# Patient Record
Sex: Male | Born: 1955 | Race: White | Hispanic: No | Marital: Married | Smoking: Current every day smoker
Health system: Southern US, Community
[De-identification: ages and names within clinical notes are randomized; demographics above are authoritative.]

## PROBLEM LIST (undated history)

## (undated) DIAGNOSIS — I6523 Occlusion and stenosis of bilateral carotid arteries: Secondary | ICD-10-CM

## (undated) DIAGNOSIS — I4510 Unspecified right bundle-branch block: Secondary | ICD-10-CM

## (undated) DIAGNOSIS — F419 Anxiety disorder, unspecified: Secondary | ICD-10-CM

## (undated) DIAGNOSIS — F319 Bipolar disorder, unspecified: Secondary | ICD-10-CM

## (undated) DIAGNOSIS — G4730 Sleep apnea, unspecified: Secondary | ICD-10-CM

## (undated) DIAGNOSIS — I10 Essential (primary) hypertension: Secondary | ICD-10-CM

## (undated) DIAGNOSIS — K219 Gastro-esophageal reflux disease without esophagitis: Secondary | ICD-10-CM

## (undated) DIAGNOSIS — F32A Depression, unspecified: Secondary | ICD-10-CM

## (undated) DIAGNOSIS — F329 Major depressive disorder, single episode, unspecified: Secondary | ICD-10-CM

## (undated) DIAGNOSIS — E119 Type 2 diabetes mellitus without complications: Secondary | ICD-10-CM

## (undated) DIAGNOSIS — J449 Chronic obstructive pulmonary disease, unspecified: Secondary | ICD-10-CM

## (undated) DIAGNOSIS — F1910 Other psychoactive substance abuse, uncomplicated: Secondary | ICD-10-CM

## (undated) DIAGNOSIS — C801 Malignant (primary) neoplasm, unspecified: Secondary | ICD-10-CM

## (undated) DIAGNOSIS — I499 Cardiac arrhythmia, unspecified: Secondary | ICD-10-CM

## (undated) DIAGNOSIS — F1020 Alcohol dependence, uncomplicated: Secondary | ICD-10-CM

## (undated) HISTORY — DX: Sleep apnea, unspecified: G47.30

## (undated) HISTORY — DX: Major depressive disorder, single episode, unspecified: F32.9

## (undated) HISTORY — DX: Chronic obstructive pulmonary disease, unspecified: J44.9

## (undated) HISTORY — DX: Anxiety disorder, unspecified: F41.9

## (undated) HISTORY — PX: CHOLECYSTECTOMY: SHX55

## (undated) HISTORY — PX: WISDOM TOOTH EXTRACTION: SHX21

## (undated) HISTORY — DX: Depression, unspecified: F32.A

## (undated) HISTORY — DX: Other psychoactive substance abuse, uncomplicated: F19.10

---

## 2011-03-23 ENCOUNTER — Ambulatory Visit (INDEPENDENT_AMBULATORY_CARE_PROVIDER_SITE_OTHER): Payer: Federal, State, Local not specified - PPO | Admitting: Family Medicine

## 2011-03-23 ENCOUNTER — Encounter: Payer: Self-pay | Admitting: Family Medicine

## 2011-03-23 ENCOUNTER — Ambulatory Visit: Payer: Federal, State, Local not specified - PPO

## 2011-03-23 DIAGNOSIS — Z23 Encounter for immunization: Secondary | ICD-10-CM

## 2011-03-23 DIAGNOSIS — M25562 Pain in left knee: Secondary | ICD-10-CM

## 2011-03-23 DIAGNOSIS — Z720 Tobacco use: Secondary | ICD-10-CM

## 2011-03-23 DIAGNOSIS — R079 Chest pain, unspecified: Secondary | ICD-10-CM | POA: Insufficient documentation

## 2011-03-23 DIAGNOSIS — J45909 Unspecified asthma, uncomplicated: Secondary | ICD-10-CM | POA: Insufficient documentation

## 2011-03-23 DIAGNOSIS — G8929 Other chronic pain: Secondary | ICD-10-CM | POA: Insufficient documentation

## 2011-03-23 DIAGNOSIS — G4733 Obstructive sleep apnea (adult) (pediatric): Secondary | ICD-10-CM

## 2011-03-23 DIAGNOSIS — M25551 Pain in right hip: Secondary | ICD-10-CM

## 2011-03-23 DIAGNOSIS — F419 Anxiety disorder, unspecified: Secondary | ICD-10-CM | POA: Insufficient documentation

## 2011-03-23 DIAGNOSIS — Z0000 Encounter for general adult medical examination without abnormal findings: Secondary | ICD-10-CM

## 2011-03-23 DIAGNOSIS — J449 Chronic obstructive pulmonary disease, unspecified: Secondary | ICD-10-CM

## 2011-03-23 DIAGNOSIS — F17200 Nicotine dependence, unspecified, uncomplicated: Secondary | ICD-10-CM | POA: Insufficient documentation

## 2011-03-23 DIAGNOSIS — E669 Obesity, unspecified: Secondary | ICD-10-CM

## 2011-03-23 LAB — POCT UA - MICROSCOPIC ONLY
Bacteria, U Microscopic: NEGATIVE
Casts, Ur, LPF, POC: NEGATIVE
Crystals, Ur, HPF, POC: NEGATIVE
Mucus, UA: NEGATIVE
RBC, urine, microscopic: NEGATIVE
Yeast, UA: NEGATIVE

## 2011-03-23 LAB — CBC
MCH: 32.6 pg (ref 26.0–34.0)
MCV: 97.3 fL (ref 78.0–100.0)
Platelets: 175 10*3/uL (ref 150–400)
RDW: 13.5 % (ref 11.5–15.5)

## 2011-03-23 LAB — POCT URINALYSIS DIPSTICK
Nitrite, UA: NEGATIVE
Protein, UA: NEGATIVE
Spec Grav, UA: >=1.03
Urobilinogen, UA: 0.2

## 2011-03-23 LAB — IFOBT (OCCULT BLOOD): IFOBT: NEGATIVE

## 2011-03-23 LAB — POCT GLYCOSYLATED HEMOGLOBIN (HGB A1C): Hemoglobin A1C: 5.7

## 2011-03-23 NOTE — Progress Notes (Signed)
Subjective:    Patient ID: [name] [name], male    DOB: [birth year], 56 y.o.   MRN: [id number]  HPI     This 56 y.o Cauc male is here for annual physical; his last visit at [facility] was June 2010. He has several chronic medical issues including: Chronic Anxiety since late 1990s which is managed by his Psychiatrist (Dr. Lorrin Mais ) who is located in [place]; Obstructive Sleep Apnea- pt has an older model CPAP and needs updating, Chronic Tobacco use with several unsuccessful attempts at cessation. The pt has been unable to quit smoking because of Chronic Anxiety which has also disabled him for several years; he last worked in 2006.He has also been unsuccessful at weight reduction because of poor stamina, dyspnea and joint discomfort. He reports walking and yard work 3-4 x per week.  PMHx: HTN, Abnormal Glucose Tolerance (pt was on Glipizide 1 -1/2 years ago but was able to lose                50 lbs so med was stopped; pt checks PPG= 140), Depression.   HCM per pt Hx: Colonoscopy- 2006 (polyps?)                           ECG- 07/2009-normal                           DEXA- 2006- normal    Review of Systems  Constitutional: Positive for activity change and fatigue. Negative for appetite change and unexpected weight change.  HENT: Positive for congestion and rhinorrhea. Negative for sneezing, postnasal drip and ear discharge.   Eyes: Positive for visual disturbance. Negative for discharge and redness.  Respiratory: Positive for chest tightness and shortness of breath. Negative for wheezing.        Had URI symptoms 6 weeks ago.  Cardiovascular: Positive for chest pain. Negative for palpitations and leg swelling.       Negative for PND or difficulty walking 2 blocks.  Gastrointestinal: Negative.   Genitourinary: Negative.   Musculoskeletal: Positive for arthralgias. Negative for myalgias and joint swelling.       Positive for stiffness in joints- hips,kness and persistent left shoulder pain.  Skin:  Negative.   Neurological: Negative for dizziness, syncope, weakness, numbness and headaches.  Hematological: Negative.   Psychiatric/Behavioral:       Chronic hx of Anxiety and Depression as outlined in HPI;also has chronic Insomnia.       Objective:   Physical Exam  Constitutional: He is oriented to person, place, and time. He appears well-developed. No distress.       Moderately Obese  HENT:  Head: Normocephalic and atraumatic.  Right Ear: External ear normal.  Left Ear: External ear normal.  Mouth/Throat: No oropharyngeal exudate.       Oropharynx is red and noted to be narrow.  Eyes: Conjunctivae and EOM are normal. Pupils are equal, round, and reactive to light. Left eye exhibits no discharge. No scleral icterus.  Neck: Normal range of motion. Neck supple. No JVD present. No thyromegaly present.  Cardiovascular: Normal rate, regular rhythm and normal heart sounds.  Exam reveals no gallop and no friction rub.   No murmur heard.      Distal pulses were slightly diminished.  Pulmonary/Chest: Effort normal. He has no wheezes. He has no rales.       Breath sounds -distant. Chest is barrel-shaped.  Abdominal: Soft.  He exhibits no distension. There is no tenderness. There is no guarding.       Difficult to appreciate organomegaly or masses due to adipose tissue.  Genitourinary: Rectum normal, prostate normal and penis normal.  Musculoskeletal: He exhibits no tenderness.       Major joints without redness, swelling or deformities. ROM slightly decreased and accompanied by stiffness.  Lymphadenopathy:    He has no cervical adenopathy.  Neurological: He is alert and oriented to person, place, and time. He has normal reflexes. No cranial nerve deficit.  Skin: Skin is warm and dry.  Psychiatric: He has a normal mood and affect. Judgment and thought content normal.       He is visibly anxious        CXR: [facility] read- Peribronchial opacities without solid masses. Degenerative changes in  thoracic spine. Suspect mlid COPD in this chronic smoker.   Dory Horn, M.D. Assessment & Plan:   1. Chronic anxiety  Cont. Current meds and follow-up with Psychiatrist.  2. Tobacco user  Encouraged attempt at reduction; Print info with tips regarding cessation given to wife who also smokes.  3. Obstructive sleep apnea  Refer to Pulmonologist for further evaluation (PFTs) and recommendation re: CPAP  4. Obesity  Encouraged small dietary changes and lifestyle changes that will promote some weight reduction. Also Advised pt to reduce Alcohol intake to 1-2 beers per sitting.  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   6. Chest pain  Suspect secondary to Pulmonary dysfunction, chronic Tobacco use and obesity. Also musculoskeletal component.  7. Routine general medical examination at a health care facility    8. Hematuria  Urinalysis shows trace blood, no RBCs on microscopic.  9. COPD, mild  Full Pulmonary eval pending; pt is mildly symptomatic.

## 2011-03-23 NOTE — Patient Instructions (Signed)
Today, we addressed several issues:  Tobacco use and Sleep Apnea- your chest xray looks like mild COPD but the official read is pending. You are being referred to a Lung Specialist. Chronic Anxiety- cont. Your meds and see your Psychiatrist. Obesity- work on healthier food choices and try to be more active.   Obesity Obesity is defined as having a body mass index (BMI) of 30 or more. To calculate your BMI divide your weight in pounds by your height in inches squared and multiply that product by 703. Major illnesses resulting from long-term obesity include:  Stroke.   Heart disease.   Diabetes.   Many cancers.   Arthritis.  Obesity also complicates recovery from many other medical problems.  CAUSES   A history of obesity in your parents.   Thyroid hormone imbalance.   Environmental factors such as excess calorie intake and physical inactivity.  TREATMENT  A healthy weight loss program includes:  A calorie restricted diet based on individual calorie needs.   Increased physical activity (exercise).  An exercise program is just as important as the right low-calorie diet.  Weight-loss medicines should be used only under the supervision of your physician. These medicines help, but only if they are used with diet and exercise programs. Medicines can have side effects including nervousness, nausea, abdominal pain, diarrhea, headache, drowsiness, and depression.  An unhealthy weight loss program includes:  Fasting.   Fad diets.   Supplements and drugs.  These choices do not succeed in long-term weight control.  HOME CARE INSTRUCTIONS  To help you make the needed dietary changes:   Exercise and perform physical activity as directed by your caregiver.   Keep a daily record of everything you eat. There are many free websites to help you with this. It may be helpful to measure your foods so you can determine if you are eating the correct portion sizes.   Use low-calorie cookbooks  or take special cooking classes.   Avoid alcohol. Drink more water and drinks with no calories.   Take vitamins and supplements only as recommended by your caregiver.   Weight loss support groups, Registered Dieticians, counselors, and stress reduction education can also be very helpful.  Document Released: 03/15/2004 Document Revised: 10/18/2010 Document Reviewed: 01/12/2007 [facility] Patient Information 2012 [place], [place].

## 2011-03-24 ENCOUNTER — Encounter: Payer: Self-pay | Admitting: Family Medicine

## 2011-03-24 ENCOUNTER — Other Ambulatory Visit: Payer: Self-pay | Admitting: Family Medicine

## 2011-03-24 LAB — VITAMIN D 25 HYDROXY (VIT D DEFICIENCY, FRACTURES): Vit D, 25-Hydroxy: 19 ng/mL — ABNORMAL LOW (ref 30–89)

## 2011-03-24 LAB — COMPREHENSIVE METABOLIC PANEL
ALT: 53 U/L (ref 0–53)
AST: 26 U/L (ref 0–37)
Alkaline Phosphatase: 78 U/L (ref 39–117)
Creat: 0.83 mg/dL (ref 0.50–1.35)
Sodium: 138 mEq/L (ref 135–145)
Total Bilirubin: 0.4 mg/dL (ref 0.3–1.2)

## 2011-03-24 LAB — LIPID PANEL
Cholesterol: 179 mg/dL (ref 0–200)
Total CHOL/HDL Ratio: 6.6 Ratio

## 2011-03-24 LAB — PSA: PSA: 0.42 ng/mL (ref <=4.00)

## 2011-03-24 MED ORDER — VITAMIN D3 1.25 MG (50000 UT) PO CAPS: 1.0000 | ORAL_CAPSULE | ORAL | Status: DC

## 2011-03-24 NOTE — Progress Notes (Signed)
Quick Note:  You labs are abnormal. Please call pt to notify him that his Triglycerides are very high (547) and this is due to his excess beer intake.His HDL is too low and his Glucose is a little high. Diet changes are necessary as we discussed. Also Vitamin D is very low. I will prescribe Vit D 50,000 IU to be taken once a week for the next 4 months or longer. This will be routed to his Pharmacy.  When he returns for follow-up, we will repeat these labs so he needs to be fasting.  ______

## 2011-04-19 ENCOUNTER — Telehealth: Payer: Self-pay

## 2011-04-19 NOTE — Telephone Encounter (Signed)
Faxed over requested records per Dr Alona Bene request

## 2011-04-19 NOTE — Telephone Encounter (Signed)
.  [facility] KATIE FROM DR Alona Bene OFFICE STATES THEY NEED RECORDS/LABS FAXED ON PT PLEASE FAX TO [phone] AND THE PHONE NUMBER IS [phone]

## 2011-04-20 ENCOUNTER — Ambulatory Visit (INDEPENDENT_AMBULATORY_CARE_PROVIDER_SITE_OTHER): Payer: Federal, State, Local not specified - PPO | Admitting: Internal Medicine

## 2011-04-20 ENCOUNTER — Encounter: Payer: Self-pay | Admitting: Internal Medicine

## 2011-04-20 VITALS — BP 130/72 | HR 92 | Ht 71.0 in | Wt 265.8 lb

## 2011-04-20 DIAGNOSIS — F411 Generalized anxiety disorder: Secondary | ICD-10-CM

## 2011-04-20 DIAGNOSIS — G4733 Obstructive sleep apnea (adult) (pediatric): Secondary | ICD-10-CM

## 2011-04-20 DIAGNOSIS — J449 Chronic obstructive pulmonary disease, unspecified: Secondary | ICD-10-CM

## 2011-04-20 DIAGNOSIS — Z720 Tobacco use: Secondary | ICD-10-CM

## 2011-04-20 DIAGNOSIS — Z23 Encounter for immunization: Secondary | ICD-10-CM

## 2011-04-20 DIAGNOSIS — F17200 Nicotine dependence, unspecified, uncomplicated: Secondary | ICD-10-CM

## 2011-04-20 DIAGNOSIS — E669 Obesity, unspecified: Secondary | ICD-10-CM

## 2011-04-20 DIAGNOSIS — F419 Anxiety disorder, unspecified: Secondary | ICD-10-CM

## 2011-04-20 MED ORDER — CLONAZEPAM 0.5 MG PO TABS: ORAL_TABLET | ORAL | Status: DC

## 2011-04-20 NOTE — Patient Instructions (Signed)
Order- DME[facility]--   BiPAP autotitration for pressure check    Insp  10-20   X  Exp  10- 20 cwp    X 2 weeks  Order- pneumovax  Script Clonazepam  To try for insomnia
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
## 2011-04-24 NOTE — Assessment & Plan Note (Signed)
Chest sounds clear despite significant tobacco use. We began a discussion of smoking cessation support techniques.

## 2011-04-24 NOTE — Assessment & Plan Note (Signed)
Insomnia discussed and addressed with clonazepam 0.5 mg 1-3 for sleep if needed

## 2011-04-24 NOTE — Assessment & Plan Note (Signed)
Consider referral to the bariatric program for weight loss.

## 2011-04-24 NOTE — Assessment & Plan Note (Signed)
Emphasized tobacco cessation. Gait pneumonia vaccine. Consider formal PFT at a future visit.

## 2011-04-24 NOTE — Assessment & Plan Note (Signed)
Weight loss encouraged. Plan-auto titrate BiPAP for pressure. Consider new machine.

## 2011-05-11 ENCOUNTER — Telehealth: Payer: Self-pay | Admitting: Internal Medicine

## 2011-05-11 NOTE — Telephone Encounter (Signed)
1 sample given to last until ov

## 2011-05-22 ENCOUNTER — Encounter: Payer: Self-pay | Admitting: Internal Medicine

## 2011-05-22 ENCOUNTER — Ambulatory Visit (INDEPENDENT_AMBULATORY_CARE_PROVIDER_SITE_OTHER): Payer: Federal, State, Local not specified - PPO | Admitting: Internal Medicine

## 2011-05-22 VITALS — BP 126/64 | HR 66 | Ht 71.0 in | Wt 248.6 lb

## 2011-05-22 DIAGNOSIS — J449 Chronic obstructive pulmonary disease, unspecified: Secondary | ICD-10-CM

## 2011-05-22 DIAGNOSIS — F419 Anxiety disorder, unspecified: Secondary | ICD-10-CM

## 2011-05-22 DIAGNOSIS — J40 Bronchitis, not specified as acute or chronic: Secondary | ICD-10-CM

## 2011-05-22 DIAGNOSIS — F411 Generalized anxiety disorder: Secondary | ICD-10-CM

## 2011-05-22 DIAGNOSIS — J4489 Other specified chronic obstructive pulmonary disease: Secondary | ICD-10-CM

## 2011-05-22 DIAGNOSIS — G4733 Obstructive sleep apnea (adult) (pediatric): Secondary | ICD-10-CM

## 2011-05-22 DIAGNOSIS — G4730 Sleep apnea, unspecified: Secondary | ICD-10-CM

## 2011-05-22 MED ORDER — ALBUTEROL SULFATE HFA 108 (90 BASE) MCG/ACT IN AERS: 2.0000 | INHALATION_SPRAY | Freq: Four times a day (QID) | RESPIRATORY_TRACT | Status: DC | PRN
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
## 2011-05-22 NOTE — Patient Instructions (Signed)
Order- [facility]- BIPAP current settings Insp 13, exp 9     May adjust for comfort in range   insp  12-15,    Exp    8-15   As needed.  Order- schedule PFT   Dx chronic bronchitis  Script for rescue albuterol HFA inhaler for use if needed  Try using clonazepam at bedtime for sleep, then if you wake later, try using lorazepam to avoid morning drug carry-over.

## 2011-05-24 ENCOUNTER — Encounter: Payer: Self-pay | Admitting: Internal Medicine

## 2011-05-30 ENCOUNTER — Encounter: Payer: Self-pay | Admitting: Internal Medicine

## 2011-05-30 NOTE — Assessment & Plan Note (Signed)
I explained the need to use CPAP/BiPAP all night every night. His download from autotitration indicated CPAP of 14 would give good control with an AHI of 1 per hour. Based on direct observation his wife changed his settings to his story 13 and expiratory 9. We will leave it there and communicate with his DME company so there is no confusion. Tries and clonazepam just at bedtime. If he wakes at 4 AM use lorazepam which will have a shorter half-life as discussed.

## 2011-05-30 NOTE — Assessment & Plan Note (Signed)
Plan PFT and an HFA rescue inhaler.

## 2011-06-14 ENCOUNTER — Ambulatory Visit (INDEPENDENT_AMBULATORY_CARE_PROVIDER_SITE_OTHER): Payer: Federal, State, Local not specified - PPO | Admitting: Internal Medicine

## 2011-06-14 DIAGNOSIS — J40 Bronchitis, not specified as acute or chronic: Secondary | ICD-10-CM

## 2011-06-14 LAB — PULMONARY FUNCTION TEST

## 2011-06-14 NOTE — Progress Notes (Signed)
PFT done today. 

## 2011-07-31 ENCOUNTER — Other Ambulatory Visit: Payer: Self-pay | Admitting: Internal Medicine

## 2011-08-02 NOTE — Telephone Encounter (Signed)
Please advise if okay to refill. Thanks.  

## 2011-08-03 NOTE — Telephone Encounter (Signed)
Ok to refill 

## 2011-08-07 NOTE — Telephone Encounter (Signed)
ok 

## 2011-08-09 NOTE — Telephone Encounter (Signed)
What is this one asking for? 

## 2011-09-24 ENCOUNTER — Ambulatory Visit: Payer: Federal, State, Local not specified - PPO | Admitting: Internal Medicine

## 2011-10-18 ENCOUNTER — Ambulatory Visit (INDEPENDENT_AMBULATORY_CARE_PROVIDER_SITE_OTHER): Payer: Federal, State, Local not specified - PPO | Admitting: Internal Medicine

## 2011-10-18 ENCOUNTER — Encounter: Payer: Self-pay | Admitting: Internal Medicine

## 2011-10-18 VITALS — BP 106/60 | HR 63 | Ht 71.0 in | Wt 237.8 lb

## 2011-10-18 DIAGNOSIS — Z720 Tobacco use: Secondary | ICD-10-CM

## 2011-10-18 DIAGNOSIS — J4489 Other specified chronic obstructive pulmonary disease: Secondary | ICD-10-CM

## 2011-10-18 DIAGNOSIS — F17200 Nicotine dependence, unspecified, uncomplicated: Secondary | ICD-10-CM

## 2011-10-18 DIAGNOSIS — J449 Chronic obstructive pulmonary disease, unspecified: Secondary | ICD-10-CM

## 2011-10-18 DIAGNOSIS — G4733 Obstructive sleep apnea (adult) (pediatric): Secondary | ICD-10-CM

## 2011-10-18 MED ORDER — CLONAZEPAM 0.5 MG PO TABS: 0.5000 mg | ORAL_TABLET | Freq: Every evening | ORAL | Status: DC | PRN
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
## 2011-10-18 NOTE — Patient Instructions (Addendum)
Continue BIPAP 13/9  An otc antihistamine is fine as needed

## 2011-10-24 ENCOUNTER — Other Ambulatory Visit: Payer: Self-pay | Admitting: Internal Medicine

## 2011-10-28 NOTE — Assessment & Plan Note (Signed)
Good compliance and control. His wife knows how to change the pressures on his machine and I have explained why to be cautious about this.

## 2011-10-28 NOTE — Assessment & Plan Note (Signed)
Counseling reinforced 

## 2011-10-28 NOTE — Assessment & Plan Note (Signed)
I reinforced the importance of smoking cessation to prevent this from getting worse.  Plan-follow chest x-ray as needed.

## 2011-11-27 ENCOUNTER — Ambulatory Visit (INDEPENDENT_AMBULATORY_CARE_PROVIDER_SITE_OTHER): Payer: Federal, State, Local not specified - PPO | Admitting: Family Medicine

## 2011-11-27 ENCOUNTER — Ambulatory Visit: Payer: Federal, State, Local not specified - PPO

## 2011-11-27 VITALS — BP 132/84 | HR 70 | Temp 98.4°F | Resp 18 | Ht 70.0 in | Wt 242.0 lb

## 2011-11-27 DIAGNOSIS — M20011 Mallet finger of right finger(s): Secondary | ICD-10-CM

## 2011-11-27 DIAGNOSIS — M79609 Pain in unspecified limb: Secondary | ICD-10-CM

## 2011-11-27 DIAGNOSIS — M79644 Pain in right finger(s): Secondary | ICD-10-CM

## 2011-11-27 DIAGNOSIS — M20019 Mallet finger of unspecified finger(s): Secondary | ICD-10-CM

## 2011-11-27 NOTE — Progress Notes (Signed)
  Subjective:    Patient ID: [name] [name], male    DOB: 04/11/1955, 56 y.o.   MRN: [id number]  HPI  3 wks ago he pushed a door open 5th finger got stuck. He had a pain and swelling around the mid-phalynx with DIP joint flexion so he wore a homemade splint with taping and a tongue depressor for about a wk but then has become progressively flexed over the past 2 wks. Pain wasn't to bad initially but is becoming worse and more painful. Pain is relieved if 5th DIP joint is held in extension, worse at rest and with added flexion.  Still red and swollen    Review of Systems     Objective:   Physical Exam  Constitutional: He is oriented to person, place, and time. He appears well-developed and well-nourished. No distress.  HENT:  Head: Normocephalic and atraumatic.  Right Ear: External ear normal.  Left Ear: External ear normal.  Eyes: Conjunctivae normal are normal. No scleral icterus.  Pulmonary/Chest: Effort normal.  Musculoskeletal:       Flexion at Right 5th DIP joint w/o PROM and mildly decreased AROM and mild effusion  Neurological: He is alert and oriented to person, place, and time.  Skin: Skin is warm and dry. He is not diaphoretic. There is erythema.  Psychiatric: He has a normal mood and affect. His behavior is normal.         [facility] reading (PRIMARY) by  Dr. Clelia Croft.  Rt 5th finger xray: subacute DIP joint injury with about 30 deg joint flexion and soft tissue swelling   Assessment & Plan:  1. 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 Splint applied - cautioned that if splint is ever removed, he needs to hold joint in extension the entire time.  Recheck in 1-2 wks to assess compliance and improvement.  If pt is not able to comply with splint for this length of time or it doesn't resolve, refer to hand surg. Pt agrees w/ plan.

## 2012-04-18 ENCOUNTER — Ambulatory Visit (INDEPENDENT_AMBULATORY_CARE_PROVIDER_SITE_OTHER): Payer: Federal, State, Local not specified - PPO | Admitting: Internal Medicine

## 2012-04-18 ENCOUNTER — Encounter: Payer: Self-pay | Admitting: Internal Medicine

## 2012-04-18 VITALS — BP 128/76 | HR 81 | Ht 71.0 in | Wt 255.6 lb

## 2012-04-18 DIAGNOSIS — F17200 Nicotine dependence, unspecified, uncomplicated: Secondary | ICD-10-CM

## 2012-04-18 DIAGNOSIS — Z720 Tobacco use: Secondary | ICD-10-CM

## 2012-04-18 DIAGNOSIS — J45909 Unspecified asthma, uncomplicated: Secondary | ICD-10-CM

## 2012-04-18 DIAGNOSIS — G4733 Obstructive sleep apnea (adult) (pediatric): Secondary | ICD-10-CM

## 2012-04-18 MED ORDER — ALBUTEROL SULFATE HFA 108 (90 BASE) MCG/ACT IN AERS: 2.0000 | INHALATION_SPRAY | Freq: Four times a day (QID) | RESPIRATORY_TRACT | Status: DC | PRN

## 2012-04-18 MED ORDER — CLONAZEPAM 0.5 MG PO TABS: 0.5000 mg | ORAL_TABLET | Freq: Every evening | ORAL | Status: DC | PRN

## 2012-04-18 NOTE — Patient Instructions (Addendum)
Order- DME [facility]  Replacement CPAP mask of choice and supplies      Dx OSA  Sample saline nasal gel   Use in nose as needed for dryness  Script sent to refill your albuterol HFA rescue inhaler  Refill script for clonazepam printed  Please call as needed
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
## 2012-04-20 NOTE — Assessment & Plan Note (Signed)
Good compliance and control 

## 2012-04-20 NOTE — Assessment & Plan Note (Signed)
Smoking cessation encouraged and reinforced

## 2012-04-20 NOTE — Assessment & Plan Note (Signed)
Use of his rescue inhaler was discussed and the importance of smoking cessation reemphasized.

## 2012-05-15 ENCOUNTER — Other Ambulatory Visit: Payer: Self-pay | Admitting: Internal Medicine

## 2012-05-15 ENCOUNTER — Telehealth: Payer: Self-pay | Admitting: Internal Medicine

## 2012-05-15 MED ORDER — CLONAZEPAM 0.5 MG PO TABS: 0.5000 mg | ORAL_TABLET | Freq: Every evening | ORAL | Status: DC | PRN

## 2012-05-15 NOTE — Telephone Encounter (Signed)
ATC [facility]. Keep getting transferred back to main menu 3 times and no one would ever pick up [facility]

## 2012-05-15 NOTE — Telephone Encounter (Signed)
Per CY-okay to refill x 5; called Rx to pharmacy voicemail.

## 2012-05-16 NOTE — Telephone Encounter (Signed)
I spoke with [facility] and they state that the pt has been getting lorazepam 0.5mg  regularly from a Dr. Lorrin Mais. And now we sent a prescription for clonazepam 0.5 1-2 at bedtime as needed for #60. Pharmacists states the pt has filled both of these meds in the past. Pharmacy just wants to clarify that it is ok for the pt to take both of these medications? Please advise. Carron Curie, CMA No Known Allergies

## 2012-05-19 NOTE — Telephone Encounter (Signed)
Per CY-okay for now; thanks for letting us know.Spoke with pharmacy; aware of okay from CY. Nothing more needed at this time.

## 2012-07-15 ENCOUNTER — Ambulatory Visit (INDEPENDENT_AMBULATORY_CARE_PROVIDER_SITE_OTHER): Payer: Federal, State, Local not specified - PPO | Admitting: Family Medicine

## 2012-07-15 VITALS — BP 136/88 | HR 75 | Temp 97.8°F | Resp 16 | Ht 72.8 in | Wt 259.4 lb

## 2012-07-15 DIAGNOSIS — T24631A Corrosion of second degree of right lower leg, initial encounter: Secondary | ICD-10-CM

## 2012-07-15 DIAGNOSIS — L03116 Cellulitis of left lower limb: Secondary | ICD-10-CM

## 2012-07-15 DIAGNOSIS — T24632A Corrosion of second degree of left lower leg, initial encounter: Secondary | ICD-10-CM

## 2012-07-15 DIAGNOSIS — R609 Edema, unspecified: Secondary | ICD-10-CM

## 2012-07-15 DIAGNOSIS — T24239A Burn of second degree of unspecified lower leg, initial encounter: Secondary | ICD-10-CM

## 2012-07-15 DIAGNOSIS — L02219 Cutaneous abscess of trunk, unspecified: Secondary | ICD-10-CM

## 2012-07-15 DIAGNOSIS — L03319 Cellulitis of trunk, unspecified: Secondary | ICD-10-CM

## 2012-07-15 MED ORDER — DOXYCYCLINE HYCLATE 100 MG PO TABS: 100.0000 mg | ORAL_TABLET | Freq: Two times a day (BID) | ORAL | Status: DC
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
## 2012-07-15 NOTE — Patient Instructions (Addendum)
Keep the burn clean and lightly dressed with gauze. Use the Silvadene for dressing it. Keep left foot elevated to reduce the swelling. Return if any significant change, acutely worsening pain, or other concerns.

## 2012-09-23 ENCOUNTER — Ambulatory Visit (INDEPENDENT_AMBULATORY_CARE_PROVIDER_SITE_OTHER): Payer: Federal, State, Local not specified - PPO | Admitting: Family Medicine

## 2012-09-23 ENCOUNTER — Encounter: Payer: Self-pay | Admitting: Family Medicine

## 2012-09-23 VITALS — BP 138/84 | HR 88 | Temp 98.4°F | Resp 18 | Ht 70.5 in | Wt 253.0 lb

## 2012-09-23 DIAGNOSIS — R9431 Abnormal electrocardiogram [ECG] [EKG]: Secondary | ICD-10-CM

## 2012-09-23 DIAGNOSIS — E669 Obesity, unspecified: Secondary | ICD-10-CM

## 2012-09-23 DIAGNOSIS — R739 Hyperglycemia, unspecified: Secondary | ICD-10-CM

## 2012-09-23 DIAGNOSIS — E786 Lipoprotein deficiency: Secondary | ICD-10-CM

## 2012-09-23 DIAGNOSIS — R55 Syncope and collapse: Secondary | ICD-10-CM

## 2012-09-23 DIAGNOSIS — E559 Vitamin D deficiency, unspecified: Secondary | ICD-10-CM | POA: Insufficient documentation

## 2012-09-23 DIAGNOSIS — R7309 Other abnormal glucose: Secondary | ICD-10-CM

## 2012-09-23 DIAGNOSIS — R42 Dizziness and giddiness: Secondary | ICD-10-CM

## 2012-09-23 DIAGNOSIS — E1169 Type 2 diabetes mellitus with other specified complication: Secondary | ICD-10-CM | POA: Insufficient documentation

## 2012-09-23 LAB — COMPREHENSIVE METABOLIC PANEL
Albumin: 4.4 g/dL (ref 3.5–5.2)
Alkaline Phosphatase: 85 U/L (ref 39–117)
Glucose, Bld: 84 mg/dL (ref 70–99)
Potassium: 3.7 mEq/L (ref 3.5–5.3)
Sodium: 138 mEq/L (ref 135–145)
Total Protein: 7 g/dL (ref 6.0–8.3)

## 2012-09-23 LAB — GLUCOSE, POCT (MANUAL RESULT ENTRY): POC Glucose: 97 mg/dl (ref 70–99)

## 2012-09-23 NOTE — Patient Instructions (Addendum)
I have referred you to a cardiologist for evaluation of abnormal ECG and syncope. Your other lab results will be available within a few days. If you have another episode or any cardiac symptoms, please seek care in the emergency department.

## 2012-09-23 NOTE — Progress Notes (Signed)
Subjective:    Patient ID: [name] [name], male    DOB: 04/05/1955, 57 y.o.   MRN: [id number]  HPI  This 57 y.o. Cauc male is here w/ his wife to be evaluated for brief syncopal episode 2 weeks  ago. This occurred when pt stood up and turned quickly. He passed out but was unconscious for   < 30 seconds. Pt denies any warning symptoms- palpitations, HA, chest tightness or diaphoresis.  He had a similar episode while walking on vacation; he turned to quickly and felt dizziness but did  not faint. Pt has chronic sinus congestion and uses BI-PAP for sleep.   Pt has PTSD which is treated by mental health provider. He is compliant with medications and  reports no adverse effects. He takes Clonazepam for sleep and uses Lorazepam as needed for  public outings.   PMHx, Soc Hx, Fam Hx and Problem List reviewed.    Review of Systems  Constitutional: Negative for fever, diaphoresis, activity change, appetite change, fatigue and unexpected weight change.  HENT: Positive for congestion and sinus pressure. Negative for sore throat, rhinorrhea, sneezing, drooling, neck pain and tinnitus.   Eyes: Negative.   Respiratory: Positive for apnea. Negative for cough, chest tightness and shortness of breath.   Cardiovascular: Negative.   Gastrointestinal: Negative.   Skin: Positive for pallor.  Neurological: Positive for dizziness, syncope and light-headedness. Negative for tremors, seizures, speech difficulty, weakness, numbness and headaches.  Psychiatric/Behavioral:       Pt has PTSD.       Objective:   Physical Exam  Nursing note and vitals reviewed. Constitutional: He is oriented to person, place, and time. He appears well-developed and well-nourished. No distress.  HENT:  Head: Normocephalic and atraumatic.  Right Ear: Hearing, tympanic membrane, external ear and ear canal normal.  Left Ear: Hearing, tympanic membrane, external ear and ear canal normal.  Nose: Mucosal edema and septal deviation  present. No rhinorrhea or sinus tenderness. Right sinus exhibits no maxillary sinus tenderness and no frontal sinus tenderness. Left sinus exhibits no frontal sinus tenderness.  Mouth/Throat: Uvula is midline, oropharynx is clear and moist and mucous membranes are normal. No oral lesions. Normal dentition.  Eyes: Conjunctivae and EOM are normal. Pupils are equal, round, and reactive to light. No scleral icterus.  Neck: Normal range of motion. Neck supple. No thyromegaly present.  Cardiovascular: Normal rate, regular rhythm and normal heart sounds.  Exam reveals no gallop and no friction rub.   No murmur heard. Pulmonary/Chest: Effort normal and breath sounds normal. No respiratory distress. He has no wheezes.  Musculoskeletal: Normal range of motion. He exhibits no edema and no tenderness.  Lymphadenopathy:    He has no cervical adenopathy.  Neurological: He is alert and oriented to person, place, and time. No cranial nerve deficit. He exhibits normal muscle tone. Coordination normal.  Skin: Skin is warm and dry. No rash noted. He is not diaphoretic. No pallor.  Psychiatric: He has a normal mood and affect. His behavior is normal. Judgment and thought content normal.    Results for orders placed in visit on 09/23/12  GLUCOSE, POCT (MANUAL RESULT ENTRY)      Result Value Range   POC Glucose 97  70 - 99 mg/dl  POCT GLYCOSYLATED HEMOGLOBIN (HGB A1C)      Result Value Range   Hemoglobin A1C 5.5     ECG: Sinus rhythm; RBBB w/ left anterior fascicular block.     Assessment & Plan:  Syncope - Plan:  EKG 12-Lead, POCT glucose (manual entry), Comprehensive metabolic panel, TSH, Ambulatory referral to Cardiology  Dizziness and giddiness - Plan: CBC with Differential, T4, Free  Hyperglycemia - Plan: POCT glucose (manual entry), POCT glycosylated hemoglobin (Hb A1C)  Nonspecific abnormal electrocardiogram (ECG) (EKG) - Plan: Ambulatory referral to Cardiology (wife requests Dr. Jacinto Halim).  Obesity,  unspecified - Plan: Comprehensive metabolic panel, Vitamin D, 25-hydroxy, TSH, T4, Free

## 2012-09-24 LAB — CBC WITH DIFFERENTIAL/PLATELET
Basophils Absolute: 0 10*3/uL (ref 0.0–0.1)
Basophils Relative: 1 % (ref 0–1)
Eosinophils Absolute: 0.1 10*3/uL (ref 0.0–0.7)
Lymphs Abs: 2.3 10*3/uL (ref 0.7–4.0)
MCH: 34.4 pg — ABNORMAL HIGH (ref 26.0–34.0)
MCHC: 35.7 g/dL (ref 30.0–36.0)
Neutrophils Relative %: 55 % (ref 43–77)
Platelets: 179 10*3/uL (ref 150–400)
RBC: 4.53 MIL/uL (ref 4.22–5.81)

## 2012-09-24 LAB — VITAMIN D 25 HYDROXY (VIT D DEFICIENCY, FRACTURES): Vit D, 25-Hydroxy: 34 ng/mL (ref 30–89)

## 2012-09-24 NOTE — Progress Notes (Signed)
Quick Note:  Please notify pt that results are normal.   Provide pt with copy of labs. ______ 

## 2012-09-26 ENCOUNTER — Encounter (HOSPITAL_COMMUNITY): Payer: Self-pay | Admitting: Emergency Medicine

## 2012-09-26 ENCOUNTER — Inpatient Hospital Stay (HOSPITAL_COMMUNITY): Payer: Medicare Other

## 2012-09-26 ENCOUNTER — Emergency Department (HOSPITAL_COMMUNITY): Payer: Medicare Other

## 2012-09-26 ENCOUNTER — Inpatient Hospital Stay (HOSPITAL_COMMUNITY)
Admission: EM | Admit: 2012-09-26 | Discharge: 2012-09-27 | DRG: 313 | Disposition: A | Discharge status: Home / Self Care | Payer: Medicare Other | Attending: Internal Medicine | Admitting: Internal Medicine

## 2012-09-26 DIAGNOSIS — R42 Dizziness and giddiness: Secondary | ICD-10-CM | POA: Diagnosis present

## 2012-09-26 DIAGNOSIS — E1169 Type 2 diabetes mellitus with other specified complication: Secondary | ICD-10-CM | POA: Diagnosis present

## 2012-09-26 DIAGNOSIS — F17200 Nicotine dependence, unspecified, uncomplicated: Secondary | ICD-10-CM | POA: Diagnosis present

## 2012-09-26 DIAGNOSIS — E785 Hyperlipidemia, unspecified: Secondary | ICD-10-CM | POA: Diagnosis present

## 2012-09-26 DIAGNOSIS — E781 Pure hyperglyceridemia: Secondary | ICD-10-CM | POA: Diagnosis present

## 2012-09-26 DIAGNOSIS — E559 Vitamin D deficiency, unspecified: Secondary | ICD-10-CM

## 2012-09-26 DIAGNOSIS — Z0000 Encounter for general adult medical examination without abnormal findings: Secondary | ICD-10-CM

## 2012-09-26 DIAGNOSIS — J4489 Other specified chronic obstructive pulmonary disease: Secondary | ICD-10-CM | POA: Diagnosis present

## 2012-09-26 DIAGNOSIS — E669 Obesity, unspecified: Secondary | ICD-10-CM

## 2012-09-26 DIAGNOSIS — R0789 Other chest pain: Principal | ICD-10-CM | POA: Diagnosis present

## 2012-09-26 DIAGNOSIS — E662 Morbid (severe) obesity with alveolar hypoventilation: Secondary | ICD-10-CM | POA: Diagnosis present

## 2012-09-26 DIAGNOSIS — F419 Anxiety disorder, unspecified: Secondary | ICD-10-CM | POA: Diagnosis present

## 2012-09-26 DIAGNOSIS — I452 Bifascicular block: Secondary | ICD-10-CM | POA: Diagnosis present

## 2012-09-26 DIAGNOSIS — E786 Lipoprotein deficiency: Secondary | ICD-10-CM

## 2012-09-26 DIAGNOSIS — F1020 Alcohol dependence, uncomplicated: Secondary | ICD-10-CM | POA: Diagnosis present

## 2012-09-26 DIAGNOSIS — R079 Chest pain, unspecified: Secondary | ICD-10-CM | POA: Diagnosis present

## 2012-09-26 DIAGNOSIS — F3289 Other specified depressive episodes: Secondary | ICD-10-CM | POA: Diagnosis present

## 2012-09-26 DIAGNOSIS — Z6836 Body mass index (BMI) 36.0-36.9, adult: Secondary | ICD-10-CM

## 2012-09-26 DIAGNOSIS — F411 Generalized anxiety disorder: Secondary | ICD-10-CM | POA: Diagnosis present

## 2012-09-26 DIAGNOSIS — G4733 Obstructive sleep apnea (adult) (pediatric): Secondary | ICD-10-CM | POA: Diagnosis present

## 2012-09-26 DIAGNOSIS — Z720 Tobacco use: Secondary | ICD-10-CM

## 2012-09-26 DIAGNOSIS — M25569 Pain in unspecified knee: Secondary | ICD-10-CM

## 2012-09-26 DIAGNOSIS — J449 Chronic obstructive pulmonary disease, unspecified: Secondary | ICD-10-CM | POA: Diagnosis present

## 2012-09-26 DIAGNOSIS — M25559 Pain in unspecified hip: Secondary | ICD-10-CM

## 2012-09-26 DIAGNOSIS — F329 Major depressive disorder, single episode, unspecified: Secondary | ICD-10-CM | POA: Diagnosis present

## 2012-09-26 DIAGNOSIS — Z79899 Other long term (current) drug therapy: Secondary | ICD-10-CM

## 2012-09-26 DIAGNOSIS — R55 Syncope and collapse: Secondary | ICD-10-CM | POA: Diagnosis present

## 2012-09-26 HISTORY — DX: Cardiac arrhythmia, unspecified: I49.9

## 2012-09-26 LAB — CBC WITH DIFFERENTIAL/PLATELET
Basophils Absolute: 0 10*3/uL (ref 0.0–0.1)
Basophils Relative: 0 % (ref 0–1)
HCT: 41.2 % (ref 39.0–52.0)
Lymphocytes Relative: 31 % (ref 12–46)
MCH: 34.9 pg — ABNORMAL HIGH (ref 26.0–34.0)
MCHC: 35.7 g/dL (ref 30.0–36.0)
MCV: 97.9 fL (ref 78.0–100.0)
Monocytes Relative: 7 % (ref 3–12)
Neutro Abs: 4.5 10*3/uL (ref 1.7–7.7)
Neutrophils Relative %: 60 % (ref 43–77)
RBC: 4.21 MIL/uL — ABNORMAL LOW (ref 4.22–5.81)

## 2012-09-26 LAB — HEPATIC FUNCTION PANEL
ALT: 52 U/L (ref 0–53)
AST: 29 U/L (ref 0–37)
Albumin: 3.5 g/dL (ref 3.5–5.2)
Alkaline Phosphatase: 71 U/L (ref 39–117)
Indirect Bilirubin: 0.3 mg/dL (ref 0.3–0.9)
Total Protein: 6.4 g/dL (ref 6.0–8.3)

## 2012-09-26 LAB — TROPONIN I
Troponin I: <0.3 ng/mL (ref <0.30)
Troponin I: <0.3 ng/mL (ref <0.30)

## 2012-09-26 LAB — CBC
HCT: 41.1 % (ref 39.0–52.0)
Hemoglobin: 14.5 g/dL (ref 13.0–17.0)
MCH: 34.1 pg — ABNORMAL HIGH (ref 26.0–34.0)
MCHC: 35.3 g/dL (ref 30.0–36.0)
MCV: 96.7 fL (ref 78.0–100.0)
Platelets: 147 10*3/uL — ABNORMAL LOW (ref 150–400)
RBC: 4.25 MIL/uL (ref 4.22–5.81)
RDW: 13.2 % (ref 11.5–15.5)
WBC: 7 10*3/uL (ref 4.0–10.5)

## 2012-09-26 LAB — PROTIME-INR
INR: 0.93 (ref 0.00–1.49)
Prothrombin Time: 12.3 seconds (ref 11.6–15.2)

## 2012-09-26 LAB — POCT I-STAT TROPONIN I: Troponin i, poc: 0 ng/mL (ref 0.00–0.08)

## 2012-09-26 LAB — BASIC METABOLIC PANEL
Chloride: 100 mEq/L (ref 96–112)
GFR calc Af Amer: >90 mL/min (ref >90)
Potassium: 4.3 mEq/L (ref 3.5–5.1)

## 2012-09-26 MED ORDER — SODIUM CHLORIDE 0.9 % IJ SOLN
3.0000 mL | Freq: Two times a day (BID) | INTRAMUSCULAR | Status: DC
  Administered 2012-09-26 – 2012-09-27 (×2): 3 mL via INTRAVENOUS

## 2012-09-26 MED ORDER — ONDANSETRON HCL 4 MG PO TABS: 4.0000 mg | ORAL_TABLET | Freq: Four times a day (QID) | ORAL | Status: DC | PRN

## 2012-09-26 MED ORDER — CLONAZEPAM 0.5 MG PO TABS
0.5000 mg | ORAL_TABLET | Freq: Every evening | ORAL | Status: DC | PRN
  Administered 2012-09-26: 0.5 mg via ORAL
  Filled 2012-09-26: qty 1

## 2012-09-26 MED ORDER — VITAMIN B-12 500 MCG PO TABS: 500.0000 ug | ORAL_TABLET | Freq: Every day | ORAL | Status: DC

## 2012-09-26 MED ORDER — CYANOCOBALAMIN 500 MCG PO TABS
500.0000 ug | ORAL_TABLET | Freq: Every day | ORAL | Status: DC
  Administered 2012-09-27: 500 ug via ORAL
  Filled 2012-09-26: qty 1

## 2012-09-26 MED ORDER — MAGNESIUM CITRATE PO SOLN: 1.0000 | Freq: Once | ORAL | Status: AC | PRN

## 2012-09-26 MED ORDER — ACETAMINOPHEN 650 MG RE SUPP: 650.0000 mg | Freq: Four times a day (QID) | RECTAL | Status: DC | PRN

## 2012-09-26 MED ORDER — MORPHINE SULFATE 2 MG/ML IJ SOLN: 2.0000 mg | INTRAMUSCULAR | Status: DC | PRN

## 2012-09-26 MED ORDER — ALUM & MAG HYDROXIDE-SIMETH 200-200-20 MG/5ML PO SUSP: 30.0000 mL | Freq: Four times a day (QID) | ORAL | Status: DC | PRN

## 2012-09-26 MED ORDER — SODIUM CHLORIDE 0.9 % IV SOLN: 250.0000 mL | INTRAVENOUS | Status: DC | PRN

## 2012-09-26 MED ORDER — TIAGABINE HCL 4 MG PO TABS
4.0000 mg | ORAL_TABLET | Freq: Every day | ORAL | Status: DC
  Administered 2012-09-26: 4 mg via ORAL
  Filled 2012-09-26 (×2): qty 1

## 2012-09-26 MED ORDER — SODIUM CHLORIDE 0.9 % IV BOLUS (SEPSIS)
1000.0000 mL | Freq: Once | INTRAVENOUS | Status: AC
  Administered 2012-09-26: 1000 mL via INTRAVENOUS

## 2012-09-26 MED ORDER — OMEGA-3-ACID ETHYL ESTERS 1 G PO CAPS
1.0000 g | ORAL_CAPSULE | Freq: Two times a day (BID) | ORAL | Status: DC
  Administered 2012-09-26 – 2012-09-27 (×2): 1 g via ORAL
  Filled 2012-09-26 (×3): qty 1

## 2012-09-26 MED ORDER — ACETAMINOPHEN 325 MG PO TABS: 650.0000 mg | ORAL_TABLET | Freq: Four times a day (QID) | ORAL | Status: DC | PRN

## 2012-09-26 MED ORDER — OXYCODONE HCL 5 MG PO TABS: 5.0000 mg | ORAL_TABLET | ORAL | Status: DC | PRN

## 2012-09-26 MED ORDER — NICOTINE 21 MG/24HR TD PT24
21.0000 mg | MEDICATED_PATCH | Freq: Every day | TRANSDERMAL | Status: DC
  Filled 2012-09-26: qty 1

## 2012-09-26 MED ORDER — SODIUM CHLORIDE 0.9 % IJ SOLN: 3.0000 mL | Freq: Two times a day (BID) | INTRAMUSCULAR | Status: DC

## 2012-09-26 MED ORDER — PANTOPRAZOLE SODIUM 40 MG PO TBEC
40.0000 mg | DELAYED_RELEASE_TABLET | Freq: Every day | ORAL | Status: DC
  Administered 2012-09-27: 40 mg via ORAL
  Filled 2012-09-26: qty 1

## 2012-09-26 MED ORDER — ENOXAPARIN SODIUM 40 MG/0.4ML ~~LOC~~ SOLN
40.0000 mg | SUBCUTANEOUS | Status: DC
  Administered 2012-09-27: 40 mg via SUBCUTANEOUS
  Filled 2012-09-26: qty 0.4

## 2012-09-26 MED ORDER — ASPIRIN 325 MG PO TABS
325.0000 mg | ORAL_TABLET | Freq: Every morning | ORAL | Status: DC
  Administered 2012-09-27: 325 mg via ORAL
  Filled 2012-09-26: qty 1

## 2012-09-26 MED ORDER — ASPIRIN 81 MG PO CHEW
324.0000 mg | CHEWABLE_TABLET | Freq: Once | ORAL | Status: DC
  Filled 2012-09-26: qty 4

## 2012-09-26 MED ORDER — ONDANSETRON HCL 4 MG/2ML IJ SOLN: 4.0000 mg | Freq: Four times a day (QID) | INTRAMUSCULAR | Status: DC | PRN

## 2012-09-26 MED ORDER — NITROGLYCERIN 0.3 MG SL SUBL: 0.3000 mg | SUBLINGUAL_TABLET | SUBLINGUAL | Status: DC | PRN

## 2012-09-26 MED ORDER — SODIUM CHLORIDE 0.9 % IV SOLN
1000.0000 mL | INTRAVENOUS | Status: DC
  Administered 2012-09-26: 1000 mL via INTRAVENOUS

## 2012-09-26 MED ORDER — SODIUM CHLORIDE 0.9 % IJ SOLN: 3.0000 mL | INTRAMUSCULAR | Status: DC | PRN

## 2012-09-26 MED ORDER — NITROGLYCERIN 0.4 MG SL SUBL: 0.4000 mg | SUBLINGUAL_TABLET | SUBLINGUAL | Status: DC | PRN

## 2012-09-26 MED ORDER — SORBITOL 70 % SOLN
30.0000 mL | Freq: Every day | Status: DC | PRN
  Filled 2012-09-26: qty 30

## 2012-09-26 MED ORDER — POLYETHYLENE GLYCOL 3350 17 G PO PACK
17.0000 g | PACK | Freq: Every day | ORAL | Status: DC | PRN
  Filled 2012-09-26: qty 1

## 2012-09-26 MED ORDER — MECLIZINE HCL 25 MG PO TABS
25.0000 mg | ORAL_TABLET | Freq: Three times a day (TID) | ORAL | Status: DC | PRN
  Filled 2012-09-26: qty 1

## 2012-09-26 NOTE — ED Provider Notes (Signed)
CSN: [id number]     Arrival date & time 09/26/12  1225 History  First MD Initiated Contact with Patient 09/26/12 1250     Chief Complaint  Patient presents with  . Chest Pain   HPI Patient presents to emergency room with complaints of chest discomfort in this. The patient states he was working outside in the yard today. After about an hour of exertion he started to have heaviness in his chest. Patient states he became spell and felt like he was going to pass out. This lasted for at least an hour. Since that time, the symptoms have slowly improved. The patient however also has had persistent lightheadedness. He feels like the symptoms increase when he turns his head it is. He feels like he gets dizzy similar to spinning around. Patient does not have a history of heart disease. He does not have history of stroke. There is no significant family history. Patient does smoke cigarettes daily.  Patient did have an episode within the last couple weeks of feeling dizzy and lightheaded. He went to see his primary doctor who did an EKG showing a right bundle branch block. Patient was referred to a cardiologist for further evaluation. Past Medical History  Diagnosis Date  . COPD (chronic obstructive pulmonary disease)   . Sleep apnea   . Anxiety   . Substance abuse   . Depression   . Irregular heart rate    History reviewed. No pertinent past surgical history. Family History  Problem Relation Age of Onset  . Diabetes Father   . Heart Problems Father    History  Substance Use Topics  . Smoking status: Current Every Day Smoker -- 1.50 packs/day for 30 years    Types: Cigarettes  . Smokeless tobacco: Not on file  . Alcohol Use: Not on file    Review of Systems  All other systems reviewed and are negative.    Allergies  Review of patient's allergies indicates no known allergies.  Home Medications   Current Outpatient Rx  Name  Route  Sig  Dispense  Refill  . albuterol (PROVENTIL  HFA;VENTOLIN HFA) 108 (90 BASE) MCG/ACT inhaler   Inhalation   Inhale 2 puffs into the lungs every 6 (six) hours as needed for wheezing or shortness of breath.   1 Inhaler   prn   . aspirin 325 MG tablet   Oral   Take 325 mg by mouth every morning.         . clonazePAM (KLONOPIN) 0.5 MG tablet   Oral   Take 1-2 tablets (0.5-1 mg total) by mouth at bedtime as needed for anxiety. Or sleep   60 tablet   5   . LORazepam (ATIVAN) 0.5 MG tablet   Oral   Take 0.5 mg by mouth at bedtime as needed for anxiety.          . Multiple Vitamin (MULTIVITAMIN WITH MINERALS) TABS tablet   Oral   Take 1 tablet by mouth daily before supper.         . Omega-3 Fatty Acids (FISH OIL) 1200 MG CAPS   Oral   Take 2,400 mg by mouth daily before supper.          Marland Kitchen OVER THE COUNTER MEDICATION   Oral   Take 1 tablet by mouth daily before supper. Vitamin D 500mg          . tiaGABine (GABITRIL) 4 MG tablet   Oral   Take 4 mg by mouth at bedtime.         Marland Kitchen  vitamin B-12 (CYANOCOBALAMIN) 500 MCG tablet   Oral   Take 500 mcg by mouth daily before supper.          . risperiDONE (RISPERDAL) 2 MG tablet   Oral   Take 2 mg by mouth at bedtime.          . sertraline (ZOLOFT) 100 MG tablet   Oral   Take 200 mg by mouth daily.           BP 146/90  Pulse 68  Temp(Src) 98 F (36.7 C) (Oral)  Resp 20  Ht 5' 10.5" (1.791 m)  Wt 255 lb 8.2 oz (115.9 kg)  BMI 36.13 kg/m2  SpO2 97% Physical Exam  Nursing note and vitals reviewed. Constitutional: He is oriented to person, place, and time. He appears well-developed and well-nourished. No distress.  HENT:  Head: Normocephalic and atraumatic.  Right Ear: External ear normal.  Left Ear: External ear normal.  Mouth/Throat: Oropharynx is clear and moist.  Eyes: Conjunctivae are normal. Right eye exhibits no discharge. Left eye exhibits no discharge. No scleral icterus.  Neck: Neck supple. No tracheal deviation present.  Cardiovascular:  Normal rate, regular rhythm and intact distal pulses.   Pulmonary/Chest: Effort normal and breath sounds normal. No stridor. No respiratory distress. He has no wheezes. He has no rales.  Abdominal: Soft. Bowel sounds are normal. He exhibits no distension. There is no tenderness. There is no rebound and no guarding.  Musculoskeletal: He exhibits no edema and no tenderness.  Neurological: He is alert and oriented to person, place, and time. He has normal strength. No cranial nerve deficit ( no gross defecits noted) or sensory deficit. He exhibits normal muscle tone. He displays no seizure activity. Coordination normal.  No pronator drift bilateral upper extrem, able to hold both legs off bed for 5 seconds, sensation intact in all extremities, no visual field cuts, no left or right sided neglect; movement of his eyes does cause some dizziness discomfort, no definite nystagmus, no deficits in his extraocular movements  Skin: Skin is warm and dry. No rash noted.  Psychiatric: He has a normal mood and affect.    ED Course  EKG Rate 70 SINUS RHYTHM RBBB AND LAFB Procedures (including critical care time)  Labs Reviewed  CBC WITH DIFFERENTIAL - Abnormal; Notable for the following:    RBC 4.21 (*)    MCH 34.9 (*)    All other components within normal limits  LIPID PANEL - Abnormal; Notable for the following:    Triglycerides 1273 (*)    All other components within normal limits  CBC - Abnormal; Notable for the following:    MCH 34.1 (*)    Platelets 147 (*)    All other components within normal limits  CREATININE, SERUM - Abnormal; Notable for the following:    GFR calc non Af Amer 80 (*)    All other components within normal limits  BASIC METABOLIC PANEL - Abnormal; Notable for the following:    Glucose, Bld 121 (*)    Calcium 8.1 (*)    All other components within normal limits  CBC - Abnormal; Notable for the following:    RBC 4.13 (*)    Platelets 135 (*)    All other components  within normal limits  URINE CULTURE  BASIC METABOLIC PANEL  PROTIME-INR  APTT  TROPONIN I  TROPONIN I  TROPONIN I  HEPATIC FUNCTION PANEL  MAGNESIUM  TSH  URINALYSIS, ROUTINE W REFLEX MICROSCOPIC  POCT I-STAT  TROPONIN I   No results found.   MDM  Chest pain Patient does have cardiac risk factors.  Initial findings do not suggest acute ischemia  however with the symptoms I will consult the medical service for observation for serial cardiac evaluation.  Dizziness Patient's symptoms could be related to presyncope associated with cardiac symptoms.  There is a component that suggest possible posterior circulation event versus vertigo. Patient does not have any focal neurologic symptoms otherwise.  Patient may warrant an MRI to rule out any acute ischemic event.  Celene Kras, MD 09/29/12 [phone]

## 2012-09-26 NOTE — ED Notes (Signed)
Saline lock to left hand with 20 gauge angiocath. Flushed with 5 mL of NS.

## 2012-09-26 NOTE — H&P (Signed)
[facility] History and Physical  Prentice Sackrider FAO:[id number] DOB: 26-Sep-1955 DOA: 09/26/2012  Referring physician: Dr Linwood Dibbles PCP: [name], MD  Specialists: Cardiology: Dr Candis Schatz Dr Maple Hudson  Chief Complaint: chest pain/ dizziness/ ?syncope  HPI: [name] [name] is a 57 y.o. male with past medical history of COPD, ongoing tobacco abuse, hypertriglyceridemia,obstructive sleep apnea, depression, anxiety, history of alcohol dependence who presents to the ED with midsternal chest pain, dizziness, an episode of questionable syncope 2 weeks prior to admission. Patient stated that he was trimming the hedges today when he suddenly felt dizzy and had to lean on his wife and also to prevent from falling and subsequently ambulated to his porch. Patient stated that he sat down on his porch drank some ice the get hydrated. Patient states during that time he had a chest pressure or heaviness in the external in nature nonradiating lasting approximately 3 hours. Patient does endorse some diaphoresis. Patient denies any nausea. [name] subsequently brought in to the ED. Patient states that has been dizzy intermittently for the past month in about 2 weeks prior to admission was walking he spun around and subsequently found himself on the floor. Patient denies any bowel or urinary incontinence. Patient doesn't does a spinning sensation and relates his dizziness with positional changes of the head. Patient does not remember what happened. [name] stated that patient had blacked out for a few seconds but by the time she got to him he was responsive. Patient states cannot remember what happened. Patient denies any fevers, no chills, no vomiting, no abdominal pain, no nausea, no diarrhea, no constipation, no abdominal pain, no weakness. Patient denies any family history of cardiac events prior to this 38. Patient was seen in the ED CT of the head which was done was negative. EKG showed a right  bundle branch block with left anterior fascicular block. I-STAT troponin was negative. Basic metabolic profile was unremarkable. CBC was unremarkable. We were called to admit the patient for further evaluation and management.   Review of Systems: The patient denies anorexia, fever, weight loss,, vision loss, decreased hearing, hoarseness, chest pain, syncope, dyspnea on exertion, peripheral edema, balance deficits, hemoptysis, abdominal pain, melena, hematochezia, severe indigestion/heartburn, hematuria, incontinence, genital sores, muscle weakness, suspicious skin lesions, transient blindness, difficulty walking, depression, unusual weight change, abnormal bleeding, enlarged lymph nodes, angioedema, and breast masses.   Past Medical History  Diagnosis Date  . COPD (chronic obstructive pulmonary disease)   . Sleep apnea   . Anxiety   . Substance abuse   . Depression   . Irregular heart rate    History reviewed. No pertinent past surgical history. Social History:  reports that he has been smoking Cigarettes.  He has a 45 pack-year smoking history. He does not have any smokeless tobacco history on file. He reports that he does not use illicit drugs. His alcohol history is not on file.  No Known Allergies  Family History  Problem Relation Age of Onset  . Diabetes Father   . Heart Problems Father     Prior to Admission medications   Medication Sig Start Date End Date Taking? Authorizing Provider  albuterol (PROVENTIL HFA;VENTOLIN HFA) 108 (90 BASE) MCG/ACT inhaler Inhale 2 puffs into the lungs every 6 (six) hours as needed for wheezing or shortness of breath. 04/18/12 04/18/13 Yes [name], MD  aspirin 325 MG tablet Take 325 mg by mouth every 6 (six) hours as needed (For shoulder pain.).   Yes Historical Provider,  MD  aspirin 325 MG tablet Take 325 mg by mouth every morning.   Yes Historical Provider, MD  clonazePAM (KLONOPIN) 0.5 MG tablet Take 1-2 tablets (0.5-1 mg total) by mouth at  bedtime as needed for anxiety. Or sleep 05/15/12 05/15/13 Yes [name], MD  LORazepam (ATIVAN) 0.5 MG tablet Take 0.5 mg by mouth at bedtime as needed for anxiety.    Yes Historical Provider, MD  Multiple Vitamin (MULTIVITAMIN WITH MINERALS) TABS tablet Take 1 tablet by mouth daily before supper.   Yes Historical Provider, MD  Omega-3 Fatty Acids (FISH OIL) 1200 MG CAPS Take 2,400 mg by mouth daily before supper.    Yes Historical Provider, MD  OVER THE COUNTER MEDICATION Take 1 tablet by mouth daily before supper. Vitamin D 500mg    Yes Historical Provider, MD  tiaGABine (GABITRIL) 4 MG tablet Take 4 mg by mouth at bedtime.   Yes Historical Provider, MD  vitamin B-12 (CYANOCOBALAMIN) 500 MCG tablet Take 500 mcg by mouth daily before supper.    Yes Historical Provider, MD  risperiDONE (RISPERDAL) 2 MG tablet Take 2 mg by mouth at bedtime.     Historical Provider, MD  sertraline (ZOLOFT) 100 MG tablet Take 200 mg by mouth daily.     Historical Provider, MD   Physical Exam: Filed Vitals:   09/26/12 1633  BP: 142/93  Pulse: 71  Temp:   Resp:      General:  WDWN in NAD  Eyes: PERRLA. EOMI.  ENT: Oropharynx, clear, no lesions, no exudates.  Neck: SUPPLE with no LAD. No JVD.  Cardiovascular: RRR no murmurs rubs or gallops.  Respiratory: Clear to auscultation bilaterally. No wheezes, no crackles, no rhonchi.  Abdomen: Obese, soft, nontender, nondistended, positive bowel sounds.  Skin: No rashes or lesions.  Musculoskeletal: 5 out of 5 bilateral upper extremity strength. 5/ 5 bilateral lower extremity strength.  Psychiatric: Normal mood. Normal affect. Fair insight. Fair judgment.  Neurologic: A and O x3. Cranial nerves II through XII are grossly intact. No focal deficits.  Labs on Admission:  Basic Metabolic Panel:  Recent Labs Lab 09/23/12 1540 09/26/12 1247  NA 138 137  K 3.7 4.3  CL 105 100  CO2 21 24  GLUCOSE 84 98  BUN 21 15  CREATININE 0.99 0.80  CALCIUM 9.0  9.1   Liver Function Tests:  Recent Labs Lab 09/23/12 1540  AST 26  ALT 65*  ALKPHOS 85  BILITOT 0.3  PROT 7.0  ALBUMIN 4.4   No results found for this basename: LIPASE, AMYLASE,  in the last 168 hours No results found for this basename: AMMONIA,  in the last 168 hours CBC:  Recent Labs Lab 09/23/12 1540 09/26/12 1247  WBC 6.6 7.4  NEUTROABS 3.7 4.5  HGB 15.6 14.7  HCT 43.7 41.2  MCV 96.5 97.9  PLT 179 166   Cardiac Enzymes: No results found for this basename: CKTOTAL, CKMB, CKMBINDEX, TROPONINI,  in the last 168 hours  BNP (last 3 results) No results found for this basename: PROBNP,  in the last 8760 hours CBG: No results found for this basename: GLUCAP,  in the last 168 hours  Radiological Exams on Admission: Ct Head Wo Contrast  09/26/2012   *RADIOLOGY REPORT*  Clinical Data: 57 year old male with syncope and dizziness.  CT HEAD WITHOUT CONTRAST  Technique:  Contiguous axial images were obtained from the base of the skull through the vertex without contrast.  Comparison: None  Findings: Minimal chronic small vessel white  matter ischemic changes are noted.  No acute intracranial abnormalities are identified, including mass lesion or mass effect, hydrocephalus, extra-axial fluid collection, midline shift, hemorrhage, or acute infarction.  The visualized bony calvarium is unremarkable.  IMPRESSION: No evidence of acute intracranial abnormality.   Original Report Authenticated By: Harmon Pier, M.D.   Dg Chest Portable 1 View  09/26/2012   *RADIOLOGY REPORT*  Clinical Data: 57 year old male with chest pain.  PORTABLE CHEST - 1 VIEW  Comparison: 03/23/2011.  Findings: Semi upright AP portable view at 1357 hours.  Cardiac size appears stable, at the upper limits of normal to mildly enlarged. Other mediastinal contours are within normal limits. Visualized tracheal air column is within normal limits.  No pneumothorax, pulmonary edema, pleural effusion or confluent pulmonary opacity.   IMPRESSION: No acute cardiopulmonary abnormality.   Original Report Authenticated By: Erskine Speed, M.D.    EKG: Independently reviewed. Right bundle branch block with left anterior fascicular block  Assessment/Plan Principal Problem:   Chest pain Active Problems:   Chronic anxiety   Tobacco user   Obstructive sleep apnea   Abnormally low high density lipoprotein (HDL) cholesterol with hypertriglyceridemia   Dizziness   Syncope   COPD (chronic obstructive pulmonary disease)  #1 chest pain Questionable etiology. Patient with a history of tobacco abuse, hyperTRIGLYCEDEMIA , obesity who presented with midsternal chest pain after a bout of dizziness with some associated diaphoresis. We'll admit patient to telemetry. EKG shows a right bundle branch block with left anterior fascicular block. Patient stated he had an outpatient appointment with Dr. Jacinto Halim no abnormal EKG. With place on aspirin, oxygen, morphine sulfate as needed, nitroglycerin. We'll cycle cardiac enzymes every 6 hours x3. Check a 2-D echo. Follow. Consult with cardiology for further evaluation and management.  #2 dizziness Likely vertiginous in nature. Patient states dizziness associated with positional changes and feels like a spinning sensation. Patient is not orthostatic. CT of the head was negative. Will check MRI of the head. Will place on meclizine as needed. PT/ OT for vestibular evaluation.  #3 questionable syncope versus presyncope Patient stated he had an episode 2 weeks prior to admission associated with dizziness and probable syncope lasting a few seconds. Patient denied any bowel or urinary incontinence at that time. Patient is not orthostatic. CT of the head was negative. Monitor on telemetry. Check carotid Dopplers. Check a 2-D echo. Check MRI of the head. Follow.  #4 hypertriglyemia Continue fish oil. Check a fasting lipid panel.  #5 COPD Stable. Tobacco cessation. Place on a nicotine patch.  #6 obstructive  sleep apnea CPAP each bedtime  #7 chronic anxiety Continue Klonopin as needed.  #8 tobacco abuse Tobacco cessation. Place on a nicotine patch.  #9 prophylaxis PPI for GI prophylaxis. Lovenox for DVT prophylaxis.  Code Status: Full Family Communication: Updated patient and wife at bedside. Disposition Plan: Admit to telemetry.  Time spent: 60 mins  [facility] Pager [phone]  If 7PM-7AM, please contact night-coverage [email or web address] Password [facility] 09/26/2012, 5:23 PM

## 2012-09-26 NOTE — ED Notes (Signed)
Pt back from MRI 

## 2012-09-26 NOTE — ED Notes (Signed)
Pt placed on O2, 2L per nasal cannula.

## 2012-09-26 NOTE — ED Notes (Signed)
Patient transported to MRI 

## 2012-09-26 NOTE — ED Notes (Signed)
Pt was at md office on tues and was told to he had a bundle branch block, pt was doing yard work yesterday and this am began having chest heavyness, wife checked pulse and rate went from 40 to 125. Pt looks pale and had a syncope epis with wife. Alert x4, pt was recently taken of zoloft and risperadol.

## 2012-09-27 DIAGNOSIS — E786 Lipoprotein deficiency: Secondary | ICD-10-CM

## 2012-09-27 DIAGNOSIS — E781 Pure hyperglyceridemia: Secondary | ICD-10-CM

## 2012-09-27 DIAGNOSIS — R42 Dizziness and giddiness: Secondary | ICD-10-CM

## 2012-09-27 LAB — CBC
HCT: 40.4 % (ref 39.0–52.0)
Hemoglobin: 13.8 g/dL (ref 13.0–17.0)
MCV: 97.8 fL (ref 78.0–100.0)
Platelets: 135 10*3/uL — ABNORMAL LOW (ref 150–400)
RBC: 4.13 MIL/uL — ABNORMAL LOW (ref 4.22–5.81)
WBC: 6.2 10*3/uL (ref 4.0–10.5)

## 2012-09-27 LAB — URINALYSIS, ROUTINE W REFLEX MICROSCOPIC
Bilirubin Urine: NEGATIVE
Ketones, ur: NEGATIVE mg/dL
Leukocytes, UA: NEGATIVE
Nitrite: NEGATIVE
Protein, ur: NEGATIVE mg/dL
Urobilinogen, UA: 0.2 mg/dL (ref 0.0–1.0)

## 2012-09-27 LAB — BASIC METABOLIC PANEL
CO2: 25 mEq/L (ref 19–32)
Calcium: 8.1 mg/dL — ABNORMAL LOW (ref 8.4–10.5)
Chloride: 102 mEq/L (ref 96–112)
Potassium: 3.8 mEq/L (ref 3.5–5.1)
Sodium: 137 mEq/L (ref 135–145)

## 2012-09-27 LAB — LIPID PANEL: Cholesterol: 195 mg/dL (ref 0–200)

## 2012-09-27 NOTE — Progress Notes (Signed)
VASCULAR LAB PRELIMINARY  PRELIMINARY  PRELIMINARY  PRELIMINARY  Carotid Dopplers completed.    Preliminary report:  There is 1-39% ICA stenosis.  Vertebral artery flow is antegrade.  Zonnie Landen, RVT 09/27/2012, 12:17 PM

## 2012-09-27 NOTE — Progress Notes (Signed)
[facility] PROGRESS NOTE  Assessment/Plan: Dizziness/ Syncope - MRI no acute CVA. - Meclazine. - PT/OT eval pending. - no events on telemetry. - orthostatic negative.  Chest pain - Cardiac markers negative x 3. - Echo pending. - No events on telemetry. - Card consult pending.   COPD (chronic obstructive pulmonary disease) - Stable. Tobacco cessation. Place on a nicotine patch.    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 Pending fasting lipid panel.  Chronic anxiety - BZD's.   Code Status: Full  Family Communication: Updated patient and wife at bedside.  Disposition Plan: Admit to telemetry.    Consultants:  cardiology  Procedures:  MRI 8.9.2014  Echo 8.9.2014  Antibiotics: none HPI/Subjective: No complains.  Objective: Filed Vitals:   09/26/12 2035 09/27/12 0033 09/27/12 0500 09/27/12 0633  BP: 155/77   141/88  Pulse: 80 80  71  Temp: 97.8 F (36.6 C)   97.5 F (36.4 C)  TempSrc: Oral   Oral  Resp: 22 20  22   Height: 5' 10.5" (1.791 m)     Weight: 116.62 kg (257 lb 1.6 oz)  115.9 kg (255 lb 8.2 oz)   SpO2: 98% 100%  99%    Intake/Output Summary (Last 24 hours) at 09/27/12 0944 Last data filed at 09/27/12 0700  Gross per 24 hour  Intake   2540 ml  Output    500 ml  Net   2040 ml   Filed Weights   09/26/12 2035 09/27/12 0500  Weight: 116.62 kg (257 lb 1.6 oz) 115.9 kg (255 lb 8.2 oz)    Exam:  General: Alert, awake, oriented x3, in no acute distress.  HEENT: No bruits, no goiter.  Heart: Regular rate and rhythm, without murmurs, rubs, gallops.  Lungs: Good air movement, clear to auscultation Abdomen: Soft, nontender, nondistended, positive bowel sounds.  Neuro: Grossly intact, nonfocal.   Data Reviewed: Basic Metabolic Panel:  Recent Labs Lab 09/23/12 1540 09/26/12 1247 09/26/12 2245 09/27/12 0542  NA 138 137  --  137  K 3.7 4.3  --  3.8  CL 105 100   --  102  CO2 21 24  --  25  GLUCOSE 84 98  --  121*  BUN 21 15  --  15  CREATININE 0.99 0.80 1.04 0.88  CALCIUM 9.0 9.1  --  8.1*  MG  --   --  2.2  --    Liver Function Tests:  Recent Labs Lab 09/23/12 1540 09/26/12 2245  AST 26 29  ALT 65* 52  ALKPHOS 85 71  BILITOT 0.3 0.4  PROT 7.0 6.4  ALBUMIN 4.4 3.5   No results found for this basename: LIPASE, AMYLASE,  in the last 168 hours No results found for this basename: AMMONIA,  in the last 168 hours CBC:  Recent Labs Lab 09/23/12 1540 09/26/12 1247 09/26/12 2245 09/27/12 0542  WBC 6.6 7.4 7.0 6.2  NEUTROABS 3.7 4.5  --   --   HGB 15.6 14.7 14.5 13.8  HCT 43.7 41.2 41.1 40.4  MCV 96.5 97.9 96.7 97.8  PLT 179 166 147* 135*   Cardiac Enzymes:  Recent Labs Lab 09/26/12 1812 09/26/12 2245 09/27/12 0542  TROPONINI <0.30 <0.30 <0.30   BNP (last 3 results) No results found for this basename: PROBNP,  in the last 8760 hours CBG: No results found for this basename: GLUCAP,  in the last 168 hours  No results found for this or any previous visit (  from the past 240 hour(s)).   Studies: Ct Head Wo Contrast  09/26/2012   *RADIOLOGY REPORT*  Clinical Data: 57 year old male with syncope and dizziness.  CT HEAD WITHOUT CONTRAST  Technique:  Contiguous axial images were obtained from the base of the skull through the vertex without contrast.  Comparison: None  Findings: Minimal chronic small vessel white matter ischemic changes are noted.  No acute intracranial abnormalities are identified, including mass lesion or mass effect, hydrocephalus, extra-axial fluid collection, midline shift, hemorrhage, or acute infarction.  The visualized bony calvarium is unremarkable.  IMPRESSION: No evidence of acute intracranial abnormality.   Original Report Authenticated By: Harmon Pier, M.D.   Mr Brain Wo Contrast  09/26/2012   *RADIOLOGY REPORT*  Clinical Data: Episodes of syncope and dizziness most recent this morning.  Pressure behind left  eye occasionally.  No history cancer.  MRI HEAD WITHOUT CONTRAST  Technique:  Multiplanar, multiecho pulse sequences of the brain and surrounding structures were obtained according to standard protocol without intravenous contrast.  Comparison: 09/26/2012 head CT.  No comparison brain MR.  Findings: No acute infarct.  No intracranial hemorrhage.  Scattered punctate white matter type changes probably related to result of small vessel disease.  Other considerations of white matter type changes such as that secondary to; vasculitis, inflammatory process, migraine headaches, remote trauma or demyelinating process felt to be secondary less likely considerations.  No intracranial mass lesion detected on this unenhanced exam.  No hydrocephalus.  Congenitally small appearing left vertebral artery.  Major intracranial vascular structures are patent.  Mild exophthalmos.  Mucosal thickening ethmoid sinus air cells and inferior left maxillary sinus.  Cervical medullary junction, pituitary region, pineal region unremarkable.  IMPRESSION: No acute infarct.  Scattered punctate white matter type changes probably related to result of small vessel disease as noted above.  Mild exophthalmos.  Mucosal thickening ethmoid sinus air cells and inferior left maxillary sinus.   Original Report Authenticated By: Lacy Duverney, M.D.   Dg Chest Portable 1 View  09/26/2012   *RADIOLOGY REPORT*  Clinical Data: 57 year old male with chest pain.  PORTABLE CHEST - 1 VIEW  Comparison: 03/23/2011.  Findings: Semi upright AP portable view at 1357 hours.  Cardiac size appears stable, at the upper limits of normal to mildly enlarged. Other mediastinal contours are within normal limits. Visualized tracheal air column is within normal limits.  No pneumothorax, pulmonary edema, pleural effusion or confluent pulmonary opacity.  IMPRESSION: No acute cardiopulmonary abnormality.   Original Report Authenticated By: Erskine Speed, M.D.    Scheduled Meds: .  aspirin  325 mg Oral q morning - 10a  . vitamin B-12  500 mcg Oral Daily  . enoxaparin (LOVENOX) injection  40 mg Subcutaneous Q24H  . nicotine  21 mg Transdermal Daily  . omega-3 acid ethyl esters  1 g Oral BID  . pantoprazole  40 mg Oral Daily  . sodium chloride  3 mL Intravenous Q12H  . sodium chloride  3 mL Intravenous Q12H  . tiaGABine  4 mg Oral QHS   Continuous Infusions: . sodium chloride 1,000 mL (09/26/12 1435)     Radonna Ricker Rosine Beat  [facility] Pager [phone]. If 8PM-8AM, please contact night-coverage at [email or web address], password [facility] 09/27/2012, 9:44 AM  LOS: 1 day

## 2012-09-27 NOTE — Progress Notes (Signed)
Patient placed on CPAP of 10.0 at this time. Tolerating well, RT will continue to monitor.

## 2012-09-27 NOTE — Evaluation (Signed)
Physical Therapy Evaluation Patient Details Name: [name] [name] MRN: [id number] DOB: Oct 12, 1955 Today's Date: 09/27/2012 Time: 4098-1191 PT Time Calculation (min): 24 min  PT Assessment / Plan / Recommendation History of Present Illness  Pt admitted with near syncopal episode at home while trimming hedges.  Pt symptomatic with head thrust and VOR testing with slight L eye movements (not clear nystagmus). Provided pt with gaze stabilization exercises and recommended OP neurorehabilitation for vestibular rehab.   Clinical Impression  Pt supervision for mobility at time of evaluation, however did note some minor instability with head turns during ambulation.  Feel he would benefit from high level balance activities and vestibular training while in hospital and recommend that he follow up with OP rehab and Neurorehabilitation.  Provided pt with gaze stabilization exercises and handout for OP rehab.      PT Assessment  Patient needs continued PT services    Follow Up Recommendations  Outpatient PT (neurorehab)    Does the patient have the potential to tolerate intense rehabilitation      Barriers to Discharge        Equipment Recommendations  None recommended by PT    Recommendations for Other Services     Frequency Min 3X/week    Precautions / Restrictions Precautions Precautions: Fall Precaution Comments: with head turns Restrictions Weight Bearing Restrictions: No   Pertinent Vitals/Pain No pain      Mobility  Bed Mobility Bed Mobility: Supine to Sit Supine to Sit: 7: Independent Transfers Transfers: Sit to Stand;Stand to Sit Sit to Stand: 5: Supervision;From bed Stand to Sit: 5: Supervision;To bed Details for Transfer Assistance: Supervision for safety.  Ambulation/Gait Ambulation/Gait Assistance: 5: Supervision Ambulation Distance (Feet): 250 Feet Assistive device: None Ambulation/Gait Assistance Details: Pt with minor instability with gait, esp when given tasks of  head turns up/down and side to side.   Gait Pattern: Step-through pattern Gait velocity: decreased    Exercises     PT Diagnosis: Abnormality of gait  PT Problem List: Decreased balance;Decreased mobility PT Treatment Interventions: Stair training;Therapeutic activities;Therapeutic exercise;Neuromuscular re-education;Balance training     PT Goals(Current goals can be found in the care plan section) Acute Rehab PT Goals Patient Stated Goal: to not be dizzy PT Goal Formulation: With patient/family Time For Goal Achievement: 10/04/12 Potential to Achieve Goals: Good  Visit Information  Last PT Received On: 09/27/12 Assistance Needed: +1 History of Present Illness: Pt admitted with near syncopal episode at home while trimming hedges.  Pt symptomatic with head thrust and VOR testing with slight L eye movements (not clear nystagmus). Provided pt with gaze stabilization exercises and recommended OP neurorehabilitation for vestibular rehab.        Prior Functioning  Home Living Family/patient expects to be discharged to:: Private residence Living Arrangements: Spouse/significant other Available Help at Discharge: Family;Available PRN/intermittently Type of Home: House Home Access: Stairs to enter [employer] of Steps: 2 Home Layout: One level Home Equipment: None Prior Function Level of Independence: Independent Communication Communication: No difficulties    Cognition  Cognition Arousal/Alertness: Awake/alert Behavior During Therapy: WFL for tasks assessed/performed Overall Cognitive Status: Within Functional Limits for tasks assessed    Extremity/Trunk Assessment Lower Extremity Assessment Lower Extremity Assessment: Overall WFL for tasks assessed Cervical / Trunk Assessment Cervical / Trunk Assessment: Normal   Balance    End of Session PT - End of Session Equipment Utilized During Treatment: Gait belt Activity Tolerance: Patient tolerated treatment  well Patient left: in bed;with bed alarm set;with family/visitor present Nurse  Communication: Mobility status  GP     Vista Deck 09/27/2012, 11:14 AM

## 2012-09-27 NOTE — Discharge Summary (Signed)
Physician Discharge Summary  [name] [name] XBM:[id number] DOB: 12-14-1955 DOA: 09/26/2012  PCP: [name] Adolph, MD  Admit date: 09/26/2012 Discharge date: 09/27/2012  Time spent: 35 minutes  Recommendations for Outpatient Follow-up:  1. Follow up with PCP in 4 week 2. Follow up with Dr. Sidney [name] in 2 week for stress test. 3. 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 male with past medical history of COPD, ongoing tobacco abuse, hypertriglyceridemia,obstructive sleep apnea, depression, anxiety, history of alcohol dependence who presents to the ED with midsternal chest pain, dizziness, an episode of questionable syncope 2 weeks prior to admission.  Patient stated that he was trimming the hedges today when he suddenly felt dizzy and had to lean on his wife and also to prevent from falling and subsequently ambulated to his porch. Patient stated that he sat down on his porch drank some ice the get hydrated. Patient states during that time he had a chest pressure or heaviness in the external in nature nonradiating lasting approximately 3 hours. Patient does endorse some diaphoresis. Patient denies any nausea. [name] subsequently brought in to the ED.  Patient states that has been dizzy intermittently for the past month in about 2 weeks prior to admission was walking he spun around and subsequently found himself on the floor. Patient denies any bowel or urinary incontinence. Patient  doesn't does a spinning sensation and relates his dizziness with positional changes of the head. Patient does not remember what happened. [name] stated that patient had blacked out for a few seconds but by the time she got to him he was responsive. Patient states cannot remember what happened. Patient denies any fevers, no chills, no vomiting, no abdominal pain, no nausea, no diarrhea, no constipation, no abdominal pain, no weakness. Patient denies any family history of cardiac events prior to this 70.  Patient was seen in the ED CT of the head which was done was negative. 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 I-STAT troponin was negative. Basic metabolic profile was unremarkable. CBC was unremarkable. We were called to admit the patient for further evaluation and management.   Hospital Course:  Dizziness/ Syncope  - MRI no acute CVA. Carotid doppler showed anterograde flow of vertebral arteries. - Meclizine prn improved dizziness. Most likely vertiginous in nature by history. - no events on telemetry.  - orthostatic negative. Cardiology consult recommended follow up with them as an outpatient. - ENT to follow up on symptoms.   Chest pain  - Cardiac markers negative x 3.  - Echo follow up as an outpatient. - No events on telemetry.  - Card consult, recommended further w/u as an outpatient.  COPD (chronic obstructive pulmonary disease)  - Stable. Tobacco cessation. Place on a nicotine patch.   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  - follow up with Cards.  Chronic anxiety  - BZD's.   Procedures:  MRI  ECHO  Carotid Doppler   Consultations:  Cardiology  Discharge Exam: Filed Vitals:   09/27/12 1109  BP: 152/98  Pulse:   Temp:   Resp:     General: See progress note  Discharge Instructions  Discharge Orders   Future Appointments Provider Department  Dept Phone   10/28/2012 1:00 PM [name] March, MD [facility] [phone]   04/20/2013 9:00 AM [name] Budge, MD [facility] [phone]   Future Orders Complete By Expires     Diet - low sodium heart healthy  As directed     Increase activity slowly  As directed         Medication List         albuterol 108 (90 BASE) MCG/ACT inhaler  Commonly known as:  PROVENTIL HFA;VENTOLIN HFA  Inhale 2 puffs into the lungs every 6 (six) hours as needed for wheezing or shortness of breath.     aspirin 325 MG tablet  Take 325 mg by mouth every morning.     clonazePAM 0.5 MG tablet  Commonly known as:  KLONOPIN  Take 1-2 tablets (0.5-1 mg total) by mouth at bedtime as needed for anxiety. Or sleep     Fish Oil 1200 MG Caps  Take 2,400 mg by mouth daily before supper.     LORazepam 0.5 MG tablet  Commonly known as:  ATIVAN  Take 0.5 mg by mouth at bedtime as needed for anxiety.     multivitamin with minerals Tabs tablet  Take 1 tablet by mouth daily before supper.     OVER THE COUNTER MEDICATION  Take 1 tablet by mouth daily before supper. Vitamin D 500mg      risperiDONE 2 MG tablet  Commonly known as:  RISPERDAL  Take 2 mg by mouth at bedtime.     sertraline 100 MG tablet  Commonly known as:  ZOLOFT  Take 200 mg by mouth daily.     tiaGABine 4 MG tablet  Commonly known as:  GABITRIL  Take 4 mg by mouth at bedtime.     vitamin B-12 500 MCG tablet  Commonly known as:  CYANOCOBALAMIN  Take 500 mcg by mouth daily before supper.       No Known Allergies     Follow-up Information   Follow up with [name] Adolph, MD.   Contact information:   [address] [place] [address] [phone]       Follow up with [name] Pert, MD In 1 week.   Contact information:   [address] [place] [address] [phone]        The results of significant diagnostics from this hospitalization (including imaging,  microbiology, ancillary and laboratory) are listed below for reference.    Significant Diagnostic Studies: Ct Head Wo Contrast  09/26/2012   *RADIOLOGY REPORT*  Clinical Data: 57 year old male with syncope and dizziness.  CT HEAD WITHOUT CONTRAST  Technique:  Contiguous axial images were obtained from the base of the skull through the vertex without contrast.  Comparison: None  Findings: Minimal chronic small vessel white matter ischemic changes are noted.  No acute intracranial abnormalities are identified, including mass lesion or mass effect, hydrocephalus, extra-axial fluid collection, midline shift, hemorrhage, or acute infarction.  The visualized bony calvarium is unremarkable.  IMPRESSION: No evidence of acute intracranial abnormality.   Original Report Authenticated By: Harmon Pier, M.D.   Mr Brain Wo Contrast  09/26/2012   *RADIOLOGY REPORT*  Clinical Data: Episodes of syncope  and dizziness most recent this morning.  Pressure behind left eye occasionally.  No history cancer.  MRI HEAD WITHOUT CONTRAST  Technique:  Multiplanar, multiecho pulse sequences of the brain and surrounding structures were obtained according to standard protocol without intravenous contrast.  Comparison: 09/26/2012 head CT.  No comparison brain MR.  Findings: No acute infarct.  No intracranial hemorrhage.  Scattered punctate white matter type changes probably related to result of small vessel disease.  Other considerations of white matter type changes such as that secondary to; vasculitis, inflammatory process, migraine headaches, remote trauma or demyelinating process felt to be secondary less likely considerations.  No intracranial mass lesion detected on this unenhanced exam.  No hydrocephalus.  Congenitally small appearing left vertebral artery.  Major intracranial vascular structures are patent.  Mild exophthalmos.  Mucosal thickening ethmoid sinus air cells and inferior left maxillary sinus.  Cervical medullary junction,  pituitary region, pineal region unremarkable.  IMPRESSION: No acute infarct.  Scattered punctate white matter type changes probably related to result of small vessel disease as noted above.  Mild exophthalmos.  Mucosal thickening ethmoid sinus air cells and inferior left maxillary sinus.   Original Report Authenticated By: Lacy Duverney, M.D.   Dg Chest Portable 1 View  09/26/2012   *RADIOLOGY REPORT*  Clinical Data: 57 year old male with chest pain.  PORTABLE CHEST - 1 VIEW  Comparison: 03/23/2011.  Findings: Semi upright AP portable view at 1357 hours.  Cardiac size appears stable, at the upper limits of normal to mildly enlarged. Other mediastinal contours are within normal limits. Visualized tracheal air column is within normal limits.  No pneumothorax, pulmonary edema, pleural effusion or confluent pulmonary opacity.  IMPRESSION: No acute cardiopulmonary abnormality.   Original Report Authenticated By: Erskine Speed, M.D.    Microbiology: No results found for this or any previous visit (from the past 240 hour(s)).   Labs: Basic Metabolic Panel:  Recent Labs Lab 09/23/12 1540 09/26/12 1247 09/26/12 2245 09/27/12 0542  NA 138 137  --  137  K 3.7 4.3  --  3.8  CL 105 100  --  102  CO2 21 24  --  25  GLUCOSE 84 98  --  121*  BUN 21 15  --  15  CREATININE 0.99 0.80 1.04 0.88  CALCIUM 9.0 9.1  --  8.1*  MG  --   --  2.2  --    Liver Function Tests:  Recent Labs Lab 09/23/12 1540 09/26/12 2245  AST 26 29  ALT 65* 52  ALKPHOS 85 71  BILITOT 0.3 0.4  PROT 7.0 6.4  ALBUMIN 4.4 3.5   No results found for this basename: LIPASE, AMYLASE,  in the last 168 hours No results found for this basename: AMMONIA,  in the last 168 hours CBC:  Recent Labs Lab 09/23/12 1540 09/26/12 1247 09/26/12 2245 09/27/12 0542  WBC 6.6 7.4 7.0 6.2  NEUTROABS 3.7 4.5  --   --   HGB 15.6 14.7 14.5 13.8  HCT 43.7 41.2 41.1 40.4  MCV 96.5 97.9 96.7 97.8  PLT 179 166 147* 135*   Cardiac  Enzymes:  Recent Labs Lab 09/26/12 1812 09/26/12 2245 09/27/12 0542  TROPONINI <0.30 <0.30 <0.30   BNP: BNP (last 3 results) No results found for this basename: PROBNP,  in the last 8760 hours CBG: No results found for this basename: GLUCAP,  in the last 168 hours     Signed:  Marinda Elk  [facility] 09/27/2012, 2:05 PM

## 2012-09-27 NOTE — Consult Note (Signed)
CARDIOLOGY CONSULT NOTE  Patient ID: [name] [name] MRN: [id number] DOB/AGE: [birth year] 57 y.o.  Admit date: 09/26/2012 Referring Physician  Jerelyn Charles Primary Physician:  [name], MD Reason for Consultation  Chest pain  HPI: Patient is a 57 year old Caucasian male with history of COPD, chronic tobacco use disorder, smokes about 1-1/2 packs a symmetric, history of sleep apnea on CPAP, managed by Dr. Jetty Duhamel, who is admitted to the hospital with chest discomfort. Story starts with patient feeling dizzy the past one to 2 months. We'll the past one month, he has had worsening dizziness. This is described as "room spinning" especially when he turns his head sideways. The morbidly the surrounding is, the more episodes of dizziness he has. He has had one episode of syncope about 2 weeks ago when he suddenly turned his neck on one side followed by dizziness and then he felt to the ground. His wife was present at the site. Yesterday he worked in the yard, was able to do all the yard work without any chest discomfort, shortness of breath. Suddenly started to feel dizzy. His wife took him inside, gain some cold green T. When he took a sip of these green tea, he complained that his chest was feeling fine. Due to this concern, they came to the emergency room. He was completely asymptomatic and presented to the ED. Exploratory laparoscopy since then he has not had any recurrence. This morning she was able to walk with the help of a physical therapist, was again able to reproduce his symptoms by turning his head sideways. He has also had occasional dizziness especially when he stands up quickly, but however this has been stable and chronic. No prior history of chest pain. He has chronic dyspnea, but no PND or orthopnea. He denies any symptoms to suggest TIA or claudication. No visual disturbances, nausea or vomiting.  Past Medical History  Diagnosis Date  . COPD (chronic obstructive pulmonary disease)    . Sleep apnea   . Anxiety   . Substance abuse   . Depression   . Irregular heart rate      History reviewed. No pertinent past surgical history.   Family History  Problem Relation Age of Onset  . Diabetes Father   . Heart Problems Father      Social History: History   Social History  . Marital Status: Married    Spouse Name: N/A    Number of Children: N/A  . Years of Education: N/A   Occupational History  . Not on file.   Social History Main Topics  . Smoking status: Current Every Day Smoker -- 1.50 packs/day for 30 years    Types: Cigarettes  . Smokeless tobacco: Not on file  . Alcohol Use: Not on file  . Drug Use: No  . Sexually Active: Not on file   Other Topics Concern  . Not on file   Social History Narrative  . No narrative on file     Prescriptions prior to admission  Medication Sig Dispense Refill  . albuterol (PROVENTIL HFA;VENTOLIN HFA) 108 (90 BASE) MCG/ACT inhaler Inhale 2 puffs into the lungs every 6 (six) hours as needed for wheezing or shortness of breath.  1 Inhaler  prn  . aspirin 325 MG tablet Take 325 mg by mouth every 6 (six) hours as needed (For shoulder pain.).      Marland Kitchen aspirin 325 MG tablet Take 325 mg by mouth every morning.      . clonazePAM (KLONOPIN) 0.5 MG  tablet Take 1-2 tablets (0.5-1 mg total) by mouth at bedtime as needed for anxiety. Or sleep  60 tablet  5  . LORazepam (ATIVAN) 0.5 MG tablet Take 0.5 mg by mouth at bedtime as needed for anxiety.       . Multiple Vitamin (MULTIVITAMIN WITH MINERALS) TABS tablet Take 1 tablet by mouth daily before supper.      . Omega-3 Fatty Acids (FISH OIL) 1200 MG CAPS Take 2,400 mg by mouth daily before supper.       Marland Kitchen OVER THE COUNTER MEDICATION Take 1 tablet by mouth daily before supper. Vitamin D 500mg       . tiaGABine (GABITRIL) 4 MG tablet Take 4 mg by mouth at bedtime.      . vitamin B-12 (CYANOCOBALAMIN) 500 MCG tablet Take 500 mcg by mouth daily before supper.       . risperiDONE  (RISPERDAL) 2 MG tablet Take 2 mg by mouth at bedtime.       . sertraline (ZOLOFT) 100 MG tablet Take 200 mg by mouth daily.         Scheduled Meds: . aspirin  325 mg Oral q morning - 10a  . vitamin B-12  500 mcg Oral Daily  . enoxaparin (LOVENOX) injection  40 mg Subcutaneous Q24H  . nicotine  21 mg Transdermal Daily  . omega-3 acid ethyl esters  1 g Oral BID  . pantoprazole  40 mg Oral Daily  . sodium chloride  3 mL Intravenous Q12H  . sodium chloride  3 mL Intravenous Q12H  . tiaGABine  4 mg Oral QHS   Continuous Infusions: . sodium chloride 1,000 mL (09/26/12 1435)   PRN Meds:.sodium chloride, acetaminophen, acetaminophen, alum & mag hydroxide-simeth, clonazePAM, meclizine, morphine injection, nitroGLYCERIN, ondansetron (ZOFRAN) IV, ondansetron, oxyCODONE, polyethylene glycol, sodium chloride, sorbitol  ROS: General: no fevers/chills/night sweats Eyes: no blurry vision, diplopia, or amaurosis ENT: no sore throat or hearing loss GI: no abdominal pain, nausea, vomiting, diarrhea, or constipation GU: no dysuria, frequency, or hematuria Skin: no rash Neuro: no headache, numbness, tingling, or weakness of extremities Musculoskeletal: no joint pain or swelling Heme: no bleeding, DVT, or easy bruising Endo: no polydipsia or polyuria   Physical Exam: Blood pressure 152/98, pulse 71, temperature 97.5 F (36.4 C), temperature source Oral, resp. rate 22, height 5' 10.5" (1.791 m), weight 115.9 kg (255 lb 8.2 oz), SpO2 99.00%.   General appearance: alert, cooperative, appears older than stated age and moderately obese Lungs: clear to auscultation bilaterally Heart: regular rate and rhythm, S1, S2 normal, no murmur, click, rub or gallop Abdomen: soft, non-tender; bowel sounds normal; no masses,  no organomegaly and Pannus present Extremities: extremities normal, atraumatic, no cyanosis or edema Pulses: 2+ and symmetric Neurologic: Grossly normal  Labs:   Lab Results  Component  Value Date   WBC 6.2 09/27/2012   HGB 13.8 09/27/2012   HCT 40.4 09/27/2012   MCV 97.8 09/27/2012   PLT 135* 09/27/2012    Recent Labs Lab 09/26/12 2245 09/27/12 0542  NA  --  137  K  --  3.8  CL  --  102  CO2  --  25  BUN  --  15  CREATININE 1.04 0.88  CALCIUM  --  8.1*  PROT 6.4  --   BILITOT 0.4  --   ALKPHOS 71  --   ALT 52  --   AST 29  --   GLUCOSE  --  121*   Lab Results  Component  Value Date   TROPONINI <0.30 09/27/2012    Lipid Panel     Component Value Date/Time   CHOL 179 03/23/2011 1043   TRIG 547* 03/23/2011 1043   HDL 27* 03/23/2011 1043   CHOLHDL 6.6 03/23/2011 1043   VLDL NOT CALC 03/23/2011 1043   LDLCALC Comment:   Not calculated due to Triglyceride >400. Suggest ordering Direct LDL (Unit Code: 41324).   Total Cholesterol/HDL Ratio:CHD Risk                        Coronary Heart Disease Risk Table                                        Men       Women          1/2 Average Risk              3.4        3.3              Average Risk              5.0        4.4           2X Average Risk              9.6        7.1           3X Average Risk             23.4       11.0 Use the calculated Patient Ratio above and the CHD Risk table  to determine the patient's CHD Risk. ATP III Classification (LDL):       < 100        mg/dL         Optimal      401 - 129     mg/dL         Near or Above Optimal      130 - 159     mg/dL         Borderline High      160 - 189     mg/dL         High       > 027        mg/dL         Very High   03/26/3662 1043    EKG: normal EKG, normal sinus rhythm, unchanged from previous tracings, RBBB.    Radiology: Ct Head Wo Contrast  09/26/2012   *RADIOLOGY REPORT*  Clinical Data: 57 year old male with syncope and dizziness.  CT HEAD WITHOUT CONTRAST  Technique:  Contiguous axial images were obtained from the base of the skull through the vertex without contrast.  Comparison: None  Findings: Minimal chronic small vessel white matter ischemic changes are noted.  No acute  intracranial abnormalities are identified, including mass lesion or mass effect, hydrocephalus, extra-axial fluid collection, midline shift, hemorrhage, or acute infarction.  The visualized bony calvarium is unremarkable.  IMPRESSION: No evidence of acute intracranial abnormality.   Original Report Authenticated By: Harmon Pier, M.D.   Mr Brain Wo Contrast  09/26/2012   *RADIOLOGY REPORT*  Clinical Data: Episodes of syncope and dizziness most recent this morning.  Pressure behind left eye occasionally.  No history cancer.  MRI HEAD WITHOUT CONTRAST  Technique:  Multiplanar, multiecho pulse sequences of the brain and surrounding structures were obtained according to standard protocol without intravenous contrast.  Comparison: 09/26/2012 head CT.  No comparison brain MR.  Findings: No acute infarct.  No intracranial hemorrhage.  Scattered punctate white matter type changes probably related to result of small vessel disease.  Other considerations of white matter type changes such as that secondary to; vasculitis, inflammatory process, migraine headaches, remote trauma or demyelinating process felt to be secondary less likely considerations.  No intracranial mass lesion detected on this unenhanced exam.  No hydrocephalus.  Congenitally small appearing left vertebral artery.  Major intracranial vascular structures are patent.  Mild exophthalmos.  Mucosal thickening ethmoid sinus air cells and inferior left maxillary sinus.  Cervical medullary junction, pituitary region, pineal region unremarkable.  IMPRESSION: No acute infarct.  Scattered punctate white matter type changes probably related to result of small vessel disease as noted above.  Mild exophthalmos.  Mucosal thickening ethmoid sinus air cells and inferior left maxillary sinus.   Original Report Authenticated By: Lacy Duverney, M.D.   Dg Chest Portable 1 View  09/26/2012   *RADIOLOGY REPORT*  Clinical Data: 57 year old male with chest pain.  PORTABLE CHEST - 1  VIEW  Comparison: 03/23/2011.  Findings: Semi upright AP portable view at 1357 hours.  Cardiac size appears stable, at the upper limits of normal to mildly enlarged. Other mediastinal contours are within normal limits. Visualized tracheal air column is within normal limits.  No pneumothorax, pulmonary edema, pleural effusion or confluent pulmonary opacity.  IMPRESSION: No acute cardiopulmonary abnormality.   Original Report Authenticated By: Erskine Speed, M.D.   Lipid Panel     Component Value Date/Time   CHOL 179 03/23/2011 1043   TRIG 547* 03/23/2011 1043   HDL 27* 03/23/2011 1043   CHOLHDL 6.6 03/23/2011 1043   VLDL NOT CALC 03/23/2011 1043   LDLCALC Comment:   Not calculated due to Triglyceride >400. Suggest ordering Direct LDL (Unit Code: 98119).   Total Cholesterol/HDL Ratio:CHD Risk                        Coronary Heart Disease Risk Table                                        Men       Women          1/2 Average Risk              3.4        3.3              Average Risk              5.0        4.4           2X Average Risk              9.6        7.1           3X Average Risk             23.4       11.0 Use the calculated Patient Ratio above and the CHD Risk table  to determine the patient's CHD Risk. ATP III Classification (LDL):       < 100        mg/dL  Optimal      100 - 129     mg/dL         Near or Above Optimal      130 - 159     mg/dL         Borderline High      160 - 189     mg/dL         High       > 191        mg/dL         Very High   05/26/8293 1043    Scheduled Meds: . aspirin  325 mg Oral q morning - 10a  . vitamin B-12  500 mcg Oral Daily  . enoxaparin (LOVENOX) injection  40 mg Subcutaneous Q24H  . nicotine  21 mg Transdermal Daily  . omega-3 acid ethyl esters  1 g Oral BID  . pantoprazole  40 mg Oral Daily  . sodium chloride  3 mL Intravenous Q12H  . sodium chloride  3 mL Intravenous Q12H  . tiaGABine  4 mg Oral QHS   Continuous Infusions: . sodium chloride 1,000 mL  (09/26/12 1435)   PRN Meds:.sodium chloride, acetaminophen, acetaminophen, alum & mag hydroxide-simeth, clonazePAM, meclizine, morphine injection, nitroGLYCERIN, ondansetron (ZOFRAN) IV, ondansetron, oxyCODONE, polyethylene glycol, sodium chloride, sorbitol    ASSESSMENT AND PLAN:  1. Atypical chest pain in a patient with multiple contrast to risk factors. EKG and cardiac markers are negative myocardial injury. Patient essentially asymptomatic. 2. Hyperlipidemia, hypertriglyceridemia 3. Obesity, moderate, obesity hypoventilation, COPD and obstructive sleep apnea on CPAP. 4. Tobacco use disorder  Recommendation: From cardiac standpoint I do not see any indication for further inpatient evaluation for unstable angina pectoris. He does make further cardiac workup. I will set him up for an outpatient stress test he already has an appointment to see me back in the office on October 07 2012, he'll keep appointment. I will follow up on his echocardiogram. With regard to his dizziness, the symptoms appear to suggest vestibular issues and he may need ENT evaluation. He also has white matter disease on the MRI, and these are nonspecific. Please do not history to call me for any further questions. He and his wife at the bedside do feel comfortable going home with outpatient followup. I would also recommend increasing Lovaza to 2 g twice a day. D/W Dr. Radonna Ricker.  Pamella Pert, MD 09/27/2012, 11:58 AM [facility] Pager: [phone] Office: [phone] If no answer Cell [phone]

## 2012-09-27 NOTE — Progress Notes (Signed)
  Echocardiogram 2D Echocardiogram has been performed.  Arvil Chaco 09/27/2012, 11:39 AM

## 2012-09-27 NOTE — Evaluation (Signed)
Occupational Therapy Evaluation and Vestibular eval Patient Details Name: [name] [name] MRN: [id number] DOB: [birth year] Today's Date: 09/27/2012 Time: 6213-0865 OT Time Calculation (min): 35 min  OT Assessment / Plan / Recommendation History of present illness Pt admitted with near syncopal episode at home while trimming hedges.  Pt symptomatic with head thrust and VOR testing with slight L eye movements (not clear nystagmus). Provided pt with gaze stabilization exercises and recommended OP neurorehabilitation for vestibular rehab.    Clinical Impression   Pt was admitted with dizziness and syncopal episode.  Will follow in acute for gaze stabilization exercises and for increased stability with functional mobility and adls, with mod I level goals in acute.      OT Assessment  Patient needs continued OT Services    Follow Up Recommendations   (vestibular rehab at neurorehab, [address])    Barriers to Discharge      Equipment Recommendations  None recommended by OT    Recommendations for Other Services    Frequency  Min 2X/week    Precautions / Restrictions Precautions Precautions: Fall Precaution Comments: with head turns Restrictions Weight Bearing Restrictions: No   Pertinent Vitals/Pain No pain reported.  Had some dizziness with head turns.  No orthostatic problems    ADL  Transfers/Ambulation Related to ADLs: supervision; slight instability with head turns.  Initially pt only moved eyes; cues for head turns.   ADL Comments: Pt can complete adls with set up or supervision to gather clothing.  Performed vestibular eval  Pt was symptomatic with VOR--eyes were not moving exactly the same.  L eye also appears to be opened wider wtih possibly swelling in the lid.  Pt symptomatic with head shaking, horizontally.  No gaze holding nystagmus and smooth pursuits/saccades were wfls.  No clear nystagmus present--cannot identify a side of hypofunction (suspect L), but gave gaze  stabilization exercises to do in sitting as this provokes symptoms.  Also recommended OP vestibular program, and gave him information.  Pt states dizziness only happens in standing:  pt was not orthostatic.  Did not complete test for BPPV--functionally he is asymptomatic for this.   Pt discovered compensation of staring at non-moving surface when symptoms are evoked.     OT Diagnosis: Generalized weakness (possible hypofunction )  OT Problem List: Other (comment);Impaired balance (sitting and/or standing) (dizziness, slight instability with balance) OT Treatment Interventions: Therapeutic activities;Balance training;Patient/family education   OT Goals(Current goals can be found in the care plan section) Acute Rehab OT Goals Patient Stated Goal: to not be dizzy OT Goal Formulation: With patient Time For Goal Achievement: 10/11/12 Potential to Achieve Goals: Good ADL Goals Additional ADL Goal #1: pt will be independent with gaze stabilization exercises, sitting for 45 second trials, near and distant Additional ADL Goal #2: Pt will be mod I with retrieving clothes and walking to bathroom to access toilet  Visit Information  Last OT Received On: 09/27/12 Assistance Needed: +1 History of Present Illness: Pt admitted with near syncopal episode at home while trimming hedges.  Pt symptomatic with head thrust and VOR testing with slight L eye movements (not clear nystagmus). Provided pt with gaze stabilization exercises and recommended OP neurorehabilitation for vestibular rehab.        Prior Functioning     Home Living Family/patient expects to be discharged to:: Private residence Living Arrangements: Spouse/significant other Available Help at Discharge: Family;Available PRN/intermittently Type of Home: House Home Access: Stairs to enter Entrance Stairs-Number of Steps: 2 Home Layout: One level Home  Equipment: None Prior Function Level of Independence:  Independent Communication Communication: No difficulties         Vision/Perception Vision - Assessment Additional Comments: L eye appears more open that right; also swelling at upper eyelid   Cognition  Cognition Arousal/Alertness: Awake/alert Behavior During Therapy: WFL for tasks assessed/performed Overall Cognitive Status: Within Functional Limits for tasks assessed    Extremity/Trunk Assessment Upper Extremity Assessment Upper Extremity Assessment: Overall WFL for tasks assessed Lower Extremity Assessment Lower Extremity Assessment: Overall WFL for tasks assessed Cervical / Trunk Assessment Cervical / Trunk Assessment: Normal     Mobility Bed Mobility Bed Mobility: Supine to Sit Supine to Sit: 7: Independent Transfers Sit to Stand: 5: Supervision;From bed Stand to Sit: 5: Supervision;To bed Details for Transfer Assistance: Supervision for safety.      Exercise     Balance     End of Session OT - End of Session Activity Tolerance: Patient tolerated treatment well Patient left: in bed;with call bell/phone within reach;with family/visitor present  GO     [name] [name] 09/27/2012, 12:07 PM Marica Otter, OTR/L [phone] 09/27/2012

## 2012-09-28 LAB — URINE CULTURE

## 2012-10-09 ENCOUNTER — Telehealth: Payer: Self-pay

## 2012-10-09 NOTE — Telephone Encounter (Signed)
Yes. Your hospitalization is not the same as a CPE. You also need to follow up with your PCP as a outpatient secondary to your hospitalization.

## 2012-10-09 NOTE — Telephone Encounter (Signed)
Please advise if CPE should still be done.

## 2012-10-09 NOTE — Telephone Encounter (Signed)
PT'S WIFE CALLED AND SAID THAT [name] [name] WAS IN THE HOSPITAL ABOUT 3 WEEKS AGO ANS HAD BLOOD WORK AND AND EKG DONE. SHE WANTS TO KNOW IF HE STILL NEEDS TO COME IN FOR HIS CPE APPT. W/ DR. Audria Nine IN September. PLEASE CALL CONNIE @ [phone]

## 2012-10-09 NOTE — Telephone Encounter (Signed)
Spoke with wife and advised to still come for CPE appt.

## 2012-10-28 ENCOUNTER — Encounter: Payer: Federal, State, Local not specified - PPO | Admitting: Family Medicine

## 2012-11-03 ENCOUNTER — Encounter: Payer: Self-pay | Admitting: Family Medicine

## 2012-12-16 ENCOUNTER — Telehealth: Payer: Self-pay | Admitting: Internal Medicine

## 2012-12-16 MED ORDER — CLONAZEPAM 0.5 MG PO TABS: 0.5000 mg | ORAL_TABLET | Freq: Every evening | ORAL | Status: DC | PRN

## 2012-12-16 NOTE — Telephone Encounter (Signed)
Called refill to pharmacy voicemail.  

## 2012-12-16 NOTE — Telephone Encounter (Signed)
Received refill request for Clonazepam 0.5mg .  Last OV 04/18/12 Pending OV 04/20/13 Last Fill 05/15/12 #60 with 5 additional refills  CY - please advise on refill. Thanks.

## 2012-12-16 NOTE — Telephone Encounter (Signed)
Ok to refill as requested 

## 2013-04-20 ENCOUNTER — Ambulatory Visit (INDEPENDENT_AMBULATORY_CARE_PROVIDER_SITE_OTHER)
Admission: RE | Admit: 2013-04-20 | Discharge: 2013-04-20 | Disposition: A | Discharge status: Home / Self Care | Payer: Federal, State, Local not specified - PPO | Source: Ambulatory Visit | Attending: Internal Medicine | Admitting: Internal Medicine

## 2013-04-20 ENCOUNTER — Encounter: Payer: Self-pay | Admitting: Internal Medicine

## 2013-04-20 ENCOUNTER — Ambulatory Visit (INDEPENDENT_AMBULATORY_CARE_PROVIDER_SITE_OTHER): Payer: Federal, State, Local not specified - PPO | Admitting: Internal Medicine

## 2013-04-20 VITALS — BP 126/88 | HR 92 | Ht 71.0 in | Wt 251.6 lb

## 2013-04-20 DIAGNOSIS — F17200 Nicotine dependence, unspecified, uncomplicated: Secondary | ICD-10-CM

## 2013-04-20 DIAGNOSIS — G4733 Obstructive sleep apnea (adult) (pediatric): Secondary | ICD-10-CM

## 2013-04-20 DIAGNOSIS — Z720 Tobacco use: Secondary | ICD-10-CM

## 2013-04-20 NOTE — Assessment & Plan Note (Signed)
Machine is well over 56 years old and may not be set or functioning at pressure in our records.  Plan- [employer] to verify PAP mode and settings. Consider if it is not able to function as bilevel.He may be due for replacement and possibly could use CPAP instead of bilevel

## 2013-04-20 NOTE — Patient Instructions (Signed)
Order- CXR  Dx tobacco abuse  Please try again to stop smoking, before your smoking stops you !!  Order- DME Huey Romans- DME please physically check  BIPAP machine to verify pressure settings 11/9. Does machine need to be replaced?
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
## 2013-04-20 NOTE — Assessment & Plan Note (Signed)
   Discussed smoking cessation. 

## 2013-04-22 ENCOUNTER — Telehealth: Payer: Self-pay | Admitting: Internal Medicine

## 2013-04-22 DIAGNOSIS — G4733 Obstructive sleep apnea (adult) (pediatric): Secondary | ICD-10-CM

## 2013-04-22 NOTE — Telephone Encounter (Signed)
Per OV 04/20/13; Patient Instructions     Order- CXR Dx tobacco abuse  Please try again to stop smoking, before your smoking stops you !!  Order- DME Huey Romans- DME please physically check BIPAP machine to verify pressure settings 11/9. Does machine need to be replaced?  --  Called spoke with spouse. She reports she thinks pt would do fine on a CPAP. This is half the price of BIPAP. Please advise Dr. Annamaria Boots thanks

## 2013-04-22 NOTE — Telephone Encounter (Signed)
Spouse aware of recs. Order placed. Nothing further needed 

## 2013-04-22 NOTE — Telephone Encounter (Signed)
Per CY-ok to order replacement with CPAP auto titrate 5-15 cwp for 7 days for pressure recommendation. DME-[facility]. Thanks.

## 2013-05-13 ENCOUNTER — Telehealth: Payer: Self-pay | Admitting: Internal Medicine

## 2013-05-13 DIAGNOSIS — G4733 Obstructive sleep apnea (adult) (pediatric): Secondary | ICD-10-CM

## 2013-05-13 NOTE — Telephone Encounter (Signed)
Called and spoke with spouse. She reports [facility] did download off CPAP machine. Requests results. She is also wanting to know if pt can get a new machine that adjusts to auto setting. Pt has had current machine x 2003. Please advise Dr. Annamaria Boots thanks

## 2013-05-21 NOTE — Telephone Encounter (Signed)
Spoke with pt's wife, she is aware that we will order new machine. Order has been placed. Called [facility] and spoke with Mariam. She is going to faxed the most recent download they have for the pt. Nothing further was needed.

## 2013-05-21 NOTE — Telephone Encounter (Signed)
I haven't seen CPAP download come through. Ok to order replacement CPAP machine AutoPap 5-15, humidifier, supplies      Dx OSA

## 2013-05-21 NOTE — Telephone Encounter (Signed)
CY please advise on phone note, Thanks.

## 2013-06-18 ENCOUNTER — Other Ambulatory Visit: Payer: Self-pay | Admitting: Internal Medicine

## 2013-06-18 MED ORDER — CLONAZEPAM 0.5 MG PO TABS: 0.5000 mg | ORAL_TABLET | Freq: Every evening | ORAL | Status: DC | PRN

## 2013-06-18 NOTE — Telephone Encounter (Signed)
Received rx refill request for Clonazepam 0.5mg . Pt is current with OV, phoned in script to [facility] on [address].

## 2013-10-30 ENCOUNTER — Encounter: Payer: Self-pay | Admitting: Family Medicine

## 2013-10-30 ENCOUNTER — Ambulatory Visit (INDEPENDENT_AMBULATORY_CARE_PROVIDER_SITE_OTHER): Payer: Federal, State, Local not specified - PPO | Admitting: Family Medicine

## 2013-10-30 ENCOUNTER — Encounter: Payer: Federal, State, Local not specified - PPO | Admitting: Family Medicine

## 2013-10-30 VITALS — BP 116/76 | HR 73 | Temp 98.4°F | Resp 16 | Ht 71.5 in | Wt 211.4 lb

## 2013-10-30 DIAGNOSIS — F411 Generalized anxiety disorder: Secondary | ICD-10-CM

## 2013-10-30 DIAGNOSIS — F419 Anxiety disorder, unspecified: Secondary | ICD-10-CM

## 2013-10-30 DIAGNOSIS — Z0000 Encounter for general adult medical examination without abnormal findings: Secondary | ICD-10-CM

## 2013-10-30 DIAGNOSIS — Z1211 Encounter for screening for malignant neoplasm of colon: Secondary | ICD-10-CM

## 2013-10-30 NOTE — Patient Instructions (Addendum)
Keeping you healthy  Get these tests  Blood pressure- Have your blood pressure checked once a year by your healthcare provider.  Normal blood pressure is 120/80  Weight- Have your body mass index (BMI) calculated to screen for obesity.  BMI is a measure of body fat based on height and weight. You can also calculate your own BMI at ViewBanking.si.  Cholesterol- Have your cholesterol checked every year.  Diabetes- Have your blood sugar checked regularly if you have high blood pressure, high cholesterol, have a family history of diabetes or if you are overweight.  Screening for Colon Cancer- Colonoscopy starting at age 49.  Screening may begin sooner depending on your family history and other health conditions. Follow up colonoscopy as directed by your Gastroenterologist.  Screening for Prostate Cancer- Both blood work (PSA) and a rectal exam help screen for Prostate Cancer.  Screening begins at age 2 with African-American men and at age 35 with Caucasian men.  Screening may begin sooner depending on your family history.  Take these medicines  Aspirin- One aspirin daily can help prevent Heart disease and Stroke.  Flu shot- Every fall.  Tetanus- Every 10 years.  Zostavax- Once after the age of 56 to prevent Shingles.  Pneumonia shot- Once after the age of 78; if you are younger than 40, ask your healthcare provider if you need a Pneumonia shot.  Take these steps  Don't smoke- If you do smoke, talk to your doctor about quitting.  For tips on how to quit, go to www.smokefree.gov or call 1-800-QUIT-NOW.  Be physically active- Exercise 5 days a week for at least 30 minutes.  If you are not already physically active start slow and gradually work up to 30 minutes of moderate physical activity.  Examples of moderate activity include walking briskly, mowing the yard, dancing, swimming, bicycling, etc.  Eat a healthy diet- Eat a variety of healthy food such as fruits, vegetables, low  fat milk, low fat cheese, yogurt, lean meant, poultry, fish, beans, tofu, etc. For more information go to www.thenutritionsource.org  Drink alcohol in moderation- Limit alcohol intake to less than two drinks a day. Never drink and drive.  Dentist- Brush and floss twice daily; visit your dentist twice a year.  Depression- Your emotional health is as important as your physical health. If you're feeling down, or losing interest in things you would normally enjoy please talk to your healthcare provider.  Eye exam- Visit your eye doctor every year. You can get an eye exam wherever eye glasses are made. Eye diseases and surgery are handled by Ophthalmologists.  Safe sex- If you may be exposed to a sexually transmitted infection, use a condom.  Seat belts- Seat belts can save your life; always wear one.  Smoke/Carbon Monoxide detectors- These detectors need to be installed on the appropriate level of your home.  Replace batteries at least once a year.  Skin cancer- When out in the sun, cover up and use sunscreen 15 SPF or higher.  Violence- If anyone is threatening you, please tell your healthcare provider.  Living Will/ Health care power of attorney- Speak with your healthcare provider and family.    There are a few non-medicinal supplements that may help decrease your anxiety.  Try a B- vitamin complex (one that has L-methylfolate, B6 and B12 can be helpful; your insurance does not cover the prescription formulation).   Chamomile tea can be a soothing drink to help calm your nerves.  One brand that is available in  most grocery stores is SLEEPY TIME TEAS.  Lavender is a calming scent and can be used for aromatherapy. There are lavender-scented lotions and oils that can be applied to the skin to help relax and promote restful sleep. [employer] makes a baby lotion that is lavender scented; you may consider applying this at bedtime to you upper chest/neck and arms.  Also, lavender scented  candles can be relaxing.

## 2013-10-30 NOTE — Progress Notes (Signed)
Subjective:    Patient ID: [name] [name], male    DOB: Feb 22, 1955, 58 y.o.   MRN: [id number]  HPI  This 58 y.o. 58 male is here for CPE.  Most significant medical problem is PTSD (anxiety very prominent; pt has well established relationship w/ psychiatrist in [place]).  HCM: CRS- 2004; overdue 2014.           Vision- > 2 years ago.           IMM- Current.  Patient Active Problem List   Diagnosis Date Noted  . Dizziness 09/26/2012  . Syncope 09/26/2012  . COPD (chronic obstructive pulmonary disease) 09/26/2012  . 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 Unspecified vitamin D deficiency 09/23/2012  . Chronic anxiety 03/23/2011  . Tobacco user 03/23/2011  . Obstructive sleep apnea 03/23/2011  . Obesity 03/23/2011  . Chronic arthralgias of knees and hips 03/23/2011  . Chest pain 03/23/2011  . Asthma with bronchitis 03/23/2011  . Health care maintenance 03/23/2011     Prior to Admission medications   Medication Sig Start Date End Date Taking? Authorizing Provider  albuterol (PROVENTIL HFA;VENTOLIN HFA) 108 (90 BASE) MCG/ACT inhaler Inhale 2 puffs into the lungs every 6 (six) hours as needed for wheezing or shortness of breath. 04/18/12  Yes Deneise Lever, MD  aspirin 325 MG tablet Take 325 mg by mouth every morning.   Yes Historical Provider, MD  clonazePAM (KLONOPIN) 0.5 MG tablet Take 1-2 tablets (0.5-1 mg total) by mouth at bedtime as needed for anxiety. Or sleep 06/18/13 06/18/14 Yes Deneise Lever, MD  LORazepam (ATIVAN) 0.5 MG tablet Take 0.5 mg by mouth at bedtime as needed for anxiety.    Yes Historical Provider, MD  Multiple Vitamin (MULTIVITAMIN WITH MINERALS) TABS tablet Take 1 tablet by mouth daily before supper.   Yes Historical Provider, MD  Omega-3 Fatty Acids (FISH OIL) 1200 MG CAPS Take 2,400 mg by mouth daily before supper.    Yes Historical Provider, MD  OVER THE COUNTER MEDICATION Take 1 tablet by mouth  daily before supper. Vitamin D 500mg    Yes Historical Provider, MD  risperiDONE (RISPERDAL) 2 MG tablet Take 2 mg by mouth at bedtime.    Yes Historical Provider, MD  sertraline (ZOLOFT) 100 MG tablet Take 200 mg by mouth daily.    Yes Historical Provider, MD  tiaGABine (GABITRIL) 4 MG tablet Take 4 mg by mouth at bedtime.   Yes Historical Provider, MD  vitamin B-12 (CYANOCOBALAMIN) 500 MCG tablet Take 500 mcg by mouth daily before supper.     Historical Provider, MD    Review of Systems  Constitutional: Negative.   HENT: Positive for dental problem.   Eyes: Negative.   Respiratory: Positive for cough and chest tightness.   Cardiovascular: Negative.   Gastrointestinal: Negative.   Endocrine: Negative.   Genitourinary: Negative.   Musculoskeletal: Positive for arthralgias.  Skin: Negative.   Allergic/Immunologic: Positive for environmental allergies.  Neurological: Positive for dizziness, syncope and light-headedness.  Hematological: Negative.   Psychiatric/Behavioral: Positive for sleep disturbance, dysphoric mood, decreased concentration and agitation. The patient is nervous/anxious.        Objective:   Physical Exam  Nursing note and vitals reviewed. Constitutional: He is oriented to person, place, and time. Vital signs are normal. He appears well-developed and well-nourished. No distress.  HENT:  Head: Normocephalic and atraumatic.  Right Ear: Hearing, tympanic membrane, external ear and ear canal normal.  Left Ear:  Hearing, tympanic membrane, external ear and ear canal normal.  Nose: Nose normal. No nasal deformity or septal deviation.  Mouth/Throat: Uvula is midline and mucous membranes are normal. No oral lesions. Abnormal dentition. No uvula swelling. Posterior oropharyngeal erythema present. No oropharyngeal exudate.  Eyes: Conjunctivae, EOM and lids are normal. Pupils are equal, round, and reactive to light. No scleral icterus.  Fundoscopic exam:      The right eye shows  no papilledema. The right eye shows red reflex.       The left eye shows no papilledema. The left eye shows red reflex.  Fundoscopic exam difficult; pt wears corrective lenses.  Neck: Trachea normal, normal range of motion, full passive range of motion without pain and phonation normal. Neck supple. No JVD present. No spinous process tenderness and no muscular tenderness present. Carotid bruit is not present. No mass and no thyromegaly present.  Cardiovascular: Normal rate, regular rhythm, S1 normal, S2 normal, normal heart sounds and normal pulses.   No extrasystoles are present. PMI is not displaced.  Exam reveals no gallop and no friction rub.   No murmur heard. Pulmonary/Chest: Effort normal and breath sounds normal. No respiratory distress. He has no decreased breath sounds. He has no wheezes.  Abdominal: Soft. Normal appearance, normal aorta and bowel sounds are normal. He exhibits no distension, no abdominal bruit, no pulsatile midline mass and no mass. There is no hepatosplenomegaly. There is no tenderness. There is no guarding and no CVA tenderness. No hernia.  Genitourinary:  Deferred.  Musculoskeletal:       Right shoulder: Normal.       Left shoulder: Normal.       Right knee: Normal.       Left knee: Normal.       Cervical back: Normal.       Thoracic back: Normal.       Lumbar back: Normal.  Mild degenerative changes in all major joints; no muscle atrophy or deformities. No c/c/e.  Lymphadenopathy:       Head (right side): No submental, no submandibular, no tonsillar, no preauricular, no posterior auricular and no occipital adenopathy present.       Head (left side): No submental, no submandibular, no tonsillar, no preauricular, no posterior auricular and no occipital adenopathy present.    He has no cervical adenopathy.       Right: No inguinal and no supraclavicular adenopathy present.       Left: No inguinal and no supraclavicular adenopathy present.  Neurological: He is  alert and oriented to person, place, and time. He has normal strength and normal reflexes. He displays no atrophy. No cranial nerve deficit or sensory deficit. He exhibits normal muscle tone. Coordination and gait normal.  Reflex Scores:      Tricep reflexes are 2+ on the right side and 2+ on the left side.      Bicep reflexes are 2+ on the right side and 2+ on the left side.      Brachioradialis reflexes are 2+ on the right side and 2+ on the left side.      Patellar reflexes are 2+ on the right side and 2+ on the left side. Skin: Skin is warm, dry and intact. No ecchymosis and no rash noted. He is not diaphoretic. No cyanosis or erythema. No pallor. Nails show no clubbing.  Psychiatric: Judgment and thought content normal. His affect is blunt. His affect is not labile and not inappropriate. His speech is delayed. He is  withdrawn. He is not agitated and not aggressive. Cognition and memory are normal. He exhibits a depressed mood.  PHQ-9 score= 16.       Assessment & Plan:  Routine general medical examination at a health care facility - Plan: CBC with Differential, Lipid panel, COMPLETE METABOLIC PANEL WITH GFR, PSA  Chronic anxiety - Continue chronic medications as per Psychiatry and follow-up as scheduled.   Plan: Thyroid Panel With TSH, COMPLETE METABOLIC PANEL WITH GFR  Encounter for screening colonoscopy for non-high-risk patient - Plan: Ambulatory referral to Gastroenterology

## 2013-10-31 LAB — LIPID PANEL
Cholesterol: 149 mg/dL (ref 0–200)
HDL: 49 mg/dL (ref >39)
LDL CALC: 72 mg/dL (ref 0–99)
Total CHOL/HDL Ratio: 3 Ratio
Triglycerides: 138 mg/dL (ref <150)
VLDL: 28 mg/dL (ref 0–40)

## 2013-10-31 LAB — CBC WITH DIFFERENTIAL/PLATELET
BASOS PCT: 0 % (ref 0–1)
Basophils Absolute: 0 10*3/uL (ref 0.0–0.1)
EOS ABS: 0.1 10*3/uL (ref 0.0–0.7)
Eosinophils Relative: 1 % (ref 0–5)
HCT: 45.4 % (ref 39.0–52.0)
HEMOGLOBIN: 15.9 g/dL (ref 13.0–17.0)
Lymphocytes Relative: 26 % (ref 12–46)
Lymphs Abs: 2.2 10*3/uL (ref 0.7–4.0)
MCH: 34.6 pg — AB (ref 26.0–34.0)
MCHC: 35 g/dL (ref 30.0–36.0)
MCV: 98.7 fL (ref 78.0–100.0)
MONO ABS: 0.6 10*3/uL (ref 0.1–1.0)
MONOS PCT: 7 % (ref 3–12)
NEUTROS PCT: 66 % (ref 43–77)
Neutro Abs: 5.5 10*3/uL (ref 1.7–7.7)
Platelets: 165 10*3/uL (ref 150–400)
RBC: 4.6 MIL/uL (ref 4.22–5.81)
RDW: 13.6 % (ref 11.5–15.5)
WBC: 8.3 10*3/uL (ref 4.0–10.5)

## 2013-10-31 LAB — THYROID PANEL WITH TSH
FREE THYROXINE INDEX: 0.9 — AB (ref 1.4–3.8)
T3 Uptake: 28 % (ref 22.0–35.0)
T4 TOTAL: 3.1 ug/dL — AB (ref 4.5–12.0)
TSH: 0.878 u[IU]/mL (ref 0.350–4.500)

## 2013-10-31 LAB — COMPLETE METABOLIC PANEL WITH GFR
ALBUMIN: 4.2 g/dL (ref 3.5–5.2)
ALT: 34 U/L (ref 0–53)
AST: 24 U/L (ref 0–37)
Alkaline Phosphatase: 64 U/L (ref 39–117)
BUN: 13 mg/dL (ref 6–23)
CALCIUM: 9 mg/dL (ref 8.4–10.5)
CHLORIDE: 103 meq/L (ref 96–112)
CO2: 25 mEq/L (ref 19–32)
Creat: 0.73 mg/dL (ref 0.50–1.35)
GFR, Est African American: >89 mL/min
GFR, Est Non African American: >89 mL/min
Glucose, Bld: 94 mg/dL (ref 70–99)
POTASSIUM: 3.9 meq/L (ref 3.5–5.3)
Sodium: 139 mEq/L (ref 135–145)
TOTAL PROTEIN: 6.6 g/dL (ref 6.0–8.3)
Total Bilirubin: 0.5 mg/dL (ref 0.2–1.2)

## 2013-10-31 LAB — PSA: PSA: 0.7 ng/mL (ref <=4.00)

## 2013-11-01 NOTE — Progress Notes (Signed)
Quick Note:  Please advise pt regarding following labs...  There are some slightly abnormal thyroid results that bare watching. Thyroid tests should be repeated in 6 months.  Otherwise, all results are normal. Blood counts, lipid panel, kidney function and liver function as well as prostate blood test are normal.  Contact the clinic if you have questions or concerns.  Copy to pt. ______

## 2013-11-04 ENCOUNTER — Encounter: Payer: Self-pay | Admitting: Radiology

## 2013-12-01 ENCOUNTER — Other Ambulatory Visit: Payer: Self-pay | Admitting: Gastroenterology

## 2014-01-01 ENCOUNTER — Encounter (HOSPITAL_COMMUNITY): Payer: Self-pay | Admitting: *Deleted

## 2014-01-18 NOTE — Anesthesia Preprocedure Evaluation (Addendum)
Anesthesia Evaluation  Patient identified by MRN, date of birth, ID band Patient awake    Reviewed: Allergy & Precautions, H&P , NPO status , Patient's Chart, lab work & pertinent test results  History of Anesthesia Complications Negative for: history of anesthetic complications  Airway Mallampati: II  TM Distance: >3 FB Neck ROM: Full    Dental no notable dental hx. (+) Dental Advisory Given, Poor Dentition, Missing, Chipped,    Pulmonary asthma , sleep apnea and Continuous Positive Airway Pressure Ventilation , COPD COPD inhaler, former smoker (reports that he quit 10 days ago),  breath sounds clear to auscultation  Pulmonary exam normal       Cardiovascular Exercise Tolerance: Good + dysrhythmias (LBBB) Rhythm:Regular Rate:Normal     Neuro/Psych PSYCHIATRIC DISORDERS Anxiety Depression negative neurological ROS     GI/Hepatic negative GI ROS, (+)     substance abuse (reports that he drinks approximately a 5th a day and his last intake was 2 days ago, he denies any current withdrawl symptoms, vital signs stable, denies any other substances)  alcohol use,   Endo/Other  negative endocrine ROS  Renal/GU negative Renal ROS  negative genitourinary   Musculoskeletal negative musculoskeletal ROS (+)   Abdominal   Peds negative pediatric ROS (+)  Hematology negative hematology ROS (+)   Anesthesia Other Findings   Reproductive/Obstetrics negative OB ROS                            Anesthesia Physical Anesthesia Plan  ASA: III  Anesthesia Plan: MAC   Post-op Pain Management:    Induction: Intravenous  Airway Management Planned: Nasal Cannula  Additional Equipment:   Intra-op Plan:   Post-operative Plan: Extubation in OR  Informed Consent: I have reviewed the patients History and Physical, chart, labs and discussed the procedure including the risks, benefits and alternatives for the  proposed anesthesia with the patient or authorized representative who has indicated his/her understanding and acceptance.   Dental advisory given  Plan Discussed with: CRNA  Anesthesia Plan Comments:         Anesthesia Quick Evaluation

## 2014-01-19 ENCOUNTER — Ambulatory Visit (HOSPITAL_COMMUNITY)
Admission: RE | Admit: 2014-01-19 | Discharge: 2014-01-19 | Disposition: A | Discharge status: Home / Self Care | Payer: Federal, State, Local not specified - PPO | Source: Ambulatory Visit | Attending: Gastroenterology | Admitting: Gastroenterology

## 2014-01-19 ENCOUNTER — Ambulatory Visit (HOSPITAL_COMMUNITY): Payer: Federal, State, Local not specified - PPO | Admitting: Anesthesiology

## 2014-01-19 ENCOUNTER — Encounter (HOSPITAL_COMMUNITY)
Admission: RE | Disposition: A | Discharge status: Home / Self Care | Payer: Self-pay | Source: Ambulatory Visit | Attending: Gastroenterology

## 2014-01-19 ENCOUNTER — Encounter (HOSPITAL_COMMUNITY): Payer: Self-pay | Admitting: Certified Registered"

## 2014-01-19 DIAGNOSIS — F4310 Post-traumatic stress disorder, unspecified: Secondary | ICD-10-CM | POA: Diagnosis not present

## 2014-01-19 DIAGNOSIS — K648 Other hemorrhoids: Secondary | ICD-10-CM | POA: Insufficient documentation

## 2014-01-19 DIAGNOSIS — Z833 Family history of diabetes mellitus: Secondary | ICD-10-CM | POA: Insufficient documentation

## 2014-01-19 DIAGNOSIS — F418 Other specified anxiety disorders: Secondary | ICD-10-CM | POA: Diagnosis not present

## 2014-01-19 DIAGNOSIS — J449 Chronic obstructive pulmonary disease, unspecified: Secondary | ICD-10-CM | POA: Insufficient documentation

## 2014-01-19 DIAGNOSIS — Z818 Family history of other mental and behavioral disorders: Secondary | ICD-10-CM | POA: Insufficient documentation

## 2014-01-19 DIAGNOSIS — D124 Benign neoplasm of descending colon: Secondary | ICD-10-CM | POA: Diagnosis not present

## 2014-01-19 DIAGNOSIS — Z1211 Encounter for screening for malignant neoplasm of colon: Secondary | ICD-10-CM | POA: Insufficient documentation

## 2014-01-19 DIAGNOSIS — F17210 Nicotine dependence, cigarettes, uncomplicated: Secondary | ICD-10-CM | POA: Insufficient documentation

## 2014-01-19 DIAGNOSIS — D120 Benign neoplasm of cecum: Secondary | ICD-10-CM | POA: Insufficient documentation

## 2014-01-19 DIAGNOSIS — F1010 Alcohol abuse, uncomplicated: Secondary | ICD-10-CM | POA: Insufficient documentation

## 2014-01-19 DIAGNOSIS — G4733 Obstructive sleep apnea (adult) (pediatric): Secondary | ICD-10-CM | POA: Insufficient documentation

## 2014-01-19 DIAGNOSIS — Z8249 Family history of ischemic heart disease and other diseases of the circulatory system: Secondary | ICD-10-CM | POA: Diagnosis not present

## 2014-01-19 DIAGNOSIS — D122 Benign neoplasm of ascending colon: Secondary | ICD-10-CM | POA: Insufficient documentation

## 2014-01-19 HISTORY — PX: COLONOSCOPY WITH PROPOFOL: SHX5780

## 2014-01-19 HISTORY — DX: Cardiac arrhythmia, unspecified: I49.9

## 2014-01-19 SURGERY — COLONOSCOPY WITH PROPOFOL
Anesthesia: Monitor Anesthesia Care

## 2014-01-19 MED ORDER — FENTANYL CITRATE 0.05 MG/ML IJ SOLN
INTRAMUSCULAR | Status: DC | PRN
  Administered 2014-01-19 (×2): 50 ug via INTRAVENOUS

## 2014-01-19 MED ORDER — PROPOFOL 10 MG/ML IV BOLUS
INTRAVENOUS | Status: AC
  Filled 2014-01-19: qty 20

## 2014-01-19 MED ORDER — PROPOFOL INFUSION 10 MG/ML OPTIME
INTRAVENOUS | Status: DC | PRN
  Administered 2014-01-19: 120 ug/kg/min via INTRAVENOUS

## 2014-01-19 MED ORDER — LIDOCAINE HCL (CARDIAC) 20 MG/ML IV SOLN
INTRAVENOUS | Status: AC
  Filled 2014-01-19: qty 5

## 2014-01-19 MED ORDER — LIDOCAINE HCL (CARDIAC) 20 MG/ML IV SOLN
INTRAVENOUS | Status: DC | PRN
  Administered 2014-01-19: 50 mg via INTRAVENOUS

## 2014-01-19 MED ORDER — FENTANYL CITRATE 0.05 MG/ML IJ SOLN
INTRAMUSCULAR | Status: AC
  Filled 2014-01-19: qty 2

## 2014-01-19 MED ORDER — SODIUM CHLORIDE 0.9 % IV SOLN
INTRAVENOUS | Status: DC
Start: 2014-01-19 — End: 2014-01-19

## 2014-01-19 MED ORDER — LACTATED RINGERS IV SOLN
INTRAVENOUS | Status: DC
  Administered 2014-01-19: 1000 mL via INTRAVENOUS

## 2014-01-19 MED ORDER — MIDAZOLAM HCL 2 MG/2ML IJ SOLN
INTRAMUSCULAR | Status: AC
Start: 2014-01-19 — End: 2014-01-19
  Filled 2014-01-19: qty 2

## 2014-01-19 MED ORDER — PROPOFOL 10 MG/ML IV BOLUS
INTRAVENOUS | Status: DC | PRN
  Administered 2014-01-19 (×3): 50 mg via INTRAVENOUS

## 2014-01-19 MED ORDER — MIDAZOLAM HCL 5 MG/5ML IJ SOLN
INTRAMUSCULAR | Status: DC | PRN
  Administered 2014-01-19: 2 mg via INTRAVENOUS

## 2014-01-19 SURGICAL SUPPLY — 21 items

## 2014-01-19 NOTE — Transfer of Care (Signed)
Immediate Anesthesia Transfer of Care Note  Patient: [name] [name]  Procedure(s) Performed: Procedure(s): COLONOSCOPY WITH PROPOFOL (N/A)  Patient Location: PACU  Anesthesia Type:MAC  Level of Consciousness: awake, alert  and oriented  Airway & Oxygen Therapy: Patient Spontanous Breathing and Patient connected to face mask oxygen  Post-op Assessment: Report given to PACU RN and Post -op Vital signs reviewed and stable  Post vital signs: Reviewed and stable  Complications: No apparent anesthesia complications

## 2014-01-19 NOTE — H&P (Signed)
[name] [name] is an 58 y.o. male.   Chief Complaint: Colorectal cancer screening. HPI: Patient is here at [facility] for a colonoscopy. He has severe OSA and therefore is being done in a hospital setting. See office notes for details.  Past Medical History  Diagnosis Date  . COPD (chronic obstructive pulmonary disease)   . Anxiety   . Substance abuse     "tends to self medicate(Rx. meds or Alcohol) trying to get relief from "PSTD"  . Irregular heart rate   . Depression     " post traumatic stress disorder" sees MD in [place] every 3-6 months.  . Sleep apnea     cpap used with nose clip- Dr. Baird Lyons follows  . Dysrhythmia     hx. Bundle branch block- saw Dr. Kathlee Nations   History reviewed. No pertinent past surgical history.  Family History  Problem Relation Age of Onset  . Diabetes Father   . Heart Problems Father   . Heart disease Father   . Mental illness Father    Social History:  reports that he has been smoking Cigarettes.  He has a 45 pack-year smoking history. He does not have any smokeless tobacco history on file. He reports that he drinks alcohol. He reports that he does not use illicit drugs.  Allergies: No Known Allergies  Medications Prior to Admission  Medication Sig Dispense Refill  . Ascorbic Acid (VITAMIN C) 1000 MG tablet Take 1,000 mg by mouth daily.    Marland Kitchen aspirin 325 MG tablet Take 325 mg by mouth every morning.    . Cholecalciferol (VITAMIN D) 2000 UNITS tablet Take 2,000 Units by mouth daily.    . clonazePAM (KLONOPIN) 0.5 MG tablet Take 1-2 tablets (0.5-1 mg total) by mouth at bedtime as needed for anxiety. Or sleep 60 tablet 5  . LORazepam (ATIVAN) 0.5 MG tablet Take 0.5 mg by mouth at bedtime as needed for anxiety.     . Multiple Vitamin (MULTIVITAMIN WITH MINERALS) TABS tablet Take 1 tablet by mouth daily before supper.    . Omega-3 Fatty Acids (FISH OIL) 1200 MG CAPS Take 2,400 mg by mouth daily before supper.     . risperiDONE (RISPERDAL) 2  MG tablet Take 2 mg by mouth at bedtime.     . sertraline (ZOLOFT) 100 MG tablet Take 200 mg by mouth every morning.     . tiaGABine (GABITRIL) 4 MG tablet Take 4 mg by mouth at bedtime.    . vitamin B-12 (CYANOCOBALAMIN) 500 MCG tablet Take 500 mcg by mouth daily before supper.     Marland Kitchen albuterol (PROVENTIL HFA;VENTOLIN HFA) 108 (90 BASE) MCG/ACT inhaler Inhale 2 puffs into the lungs every 6 (six) hours as needed for wheezing or shortness of breath. 1 Inhaler prn   Review of Systems  Constitutional: Negative.   HENT: Negative.   Eyes: Negative.   Respiratory: Negative.   Cardiovascular: Negative.   Gastrointestinal: Negative.   Genitourinary: Negative.   Musculoskeletal: Positive for joint pain.  Skin: Negative.   Neurological: Negative.   Endo/Heme/Allergies: Negative.   Psychiatric/Behavioral: Positive for depression. The patient is nervous/anxious.    Blood pressure 162/93, temperature 98.1 F (36.7 C), temperature source Oral, resp. rate 21, height 5\' 11"  (1.803 m), weight 101.606 kg (224 lb), SpO2 95 %. Physical Exam  Constitutional: He is oriented to person, place, and time. He appears well-developed and well-nourished.  HENT:  Head: Normocephalic and atraumatic.  Eyes: Conjunctivae and EOM are normal. Pupils are equal,  round, and reactive to light.  Neck: Normal range of motion. Neck supple.  Cardiovascular: Normal rate and regular rhythm.   Respiratory: Effort normal and breath sounds normal.  GI: Soft. Bowel sounds are normal.  Musculoskeletal: Normal range of motion.  Neurological: He is alert and oriented to person, place, and time.  Skin: Skin is warm and dry.  Psychiatric: He has a normal mood and affect. His behavior is normal. Judgment and thought content normal.    Assessment/Plan Colorectal cancer screening: proceed with a colonoscopy at this time.  Lynix Bonine 01/19/2014, 7:32 AM

## 2014-01-19 NOTE — Anesthesia Postprocedure Evaluation (Signed)
  Anesthesia Post-op Note  Patient: [name] [name]  Procedure(s) Performed: Procedure(s) (LRB): COLONOSCOPY WITH PROPOFOL (N/A)  Patient Location: PACU  Anesthesia Type: MAC  Level of Consciousness: awake and alert   Airway and Oxygen Therapy: Patient Spontanous Breathing  Post-op Pain: mild  Post-op Assessment: Post-op Vital signs reviewed, Patient's Cardiovascular Status Stable, Respiratory Function Stable, Patent Airway and No signs of Nausea or vomiting  Last Vitals:  Filed Vitals:   01/19/14 0821  BP: 150/88  Pulse: 64  Temp: 36.7 C  Resp: 16    Post-op Vital Signs: stable   Complications: No apparent anesthesia complications

## 2014-01-19 NOTE — Op Note (Signed)
[facility] [place], [address]   OPERATIVE PROCEDURE REPORT  PATIENT: [name] [name], [name] [name]  MR#: [id number] BIRTHDATE: Feb 18, 1956 GENDER: male ENDOSCOPIST: Edmonia James, MD ASSISTANT:   Cherylynn Ridges, technician and Hilma Favors, RN. PROCEDURE DATE: 01/19/2014 PRE-PROCEDURE PREPARATION: The patient was prepped with a gallon of Golytely the night prior to the procedure.  The patient was fasted for 4 hours prior to the procedure. PRE-PROCEDURE PHYSICAL: Patient has stable vital signs.  Neck is supple.  There is no JVD, thyromegaly or LAD.  Chest clear to auscultation.  S1 and S2 regular.  Abdomen soft, non-distended, non-tender with NABS. PROCEDURE:     Colonoscopy with cold biopsies x 3 and cold snare polypectomy x 2. ASA CLASS:     Class III INDICATIONS:     1.  Colorectal cancer screening-average risk for colon cancer. MEDICATIONS:     As per monitored anesthesia care  DESCRIPTION OF PROCEDURE: After the risks, benefits, and alternatives of the procedure were thoroughly explained [including a 10% missed rate of cancer and polyps], informed consent was obtained.  Digital rectal exam was performed.  The Pentax video colonoscope  [id number]  was introduced through the anus  and advanced to the cecum, which was identified by both the appendix and ileocecal valve , limited by No adverse events experienced.  The quality of the prep was fair. . Multiple washes were done. Small lesions could be missed. The instrument was then slowly withdrawn as the colon was fully examined.     COLON FINDINGS: A 6 mm sessile polyp was found in the proximal left colon and was removed with a cold snare x 1.  The resection was complete, the polyp tissue was completely retrieved and sent to histology. Three dimunitive polyps were found in the ascending colon and were removed by cold biopsies x 3.  Sample was obtained and sent to histology. A single 5-6 mm sessile polyp was  found at the cecum; polypectomy was performed with a cold snare.  The resection was complete, the polyp tissue was completely retrieved and sent to histology. Small internal hemorrhoids were noted on retroflexion. The rest of the colonic mucosa appeared healthy with a normal vascular pattern. No masses, diverticula or AVMs were noted. The appendiceal orifice and the ICV were identified and photographed.    The patient tolerated the procedure without immediate complications. The scope was then withdrawn from the patient and the procedure terminated.  TIME TO CECUM:   08 minutes 0 seconds WITHDRAW TIME:  12 minutes 0 seconds  IMPRESSION:     1.  One sessile 6 mm polyp in the proximal left colon; polypectomy was performed using a cold snare x 1. 2.  Three dimunitive polyps were found in the ascending colon-removed by cold biopsies x 3. 3.  Single 5-6 mm sessile polyp was found at the cecum; polypectomy was performed with cold snare x 1. 4.  Small internal hemorrhoids noted on retroflexion  RECOMMENDATIONS:     1.  Hold Aspirin and all other NSAIDS for 2 weeks. 2.  Continue current medications. 3.  Await pathology results. 4.  High fiber diet with liberal fluid intake. 5.  OP follow-up is advised on a PRN basis.  REPEAT EXAM:      In 5 years  for colonoscopy.  If the patient has any abnormal GI symptoms in the interim, he have been advised to contact the office as soon as possible for further recommendations.    REFERRED GB:[id number]  Leward Quan, M.D. eSigned:  Edmonia James, MD Feb 01, 2014 8:30 AM  CPT CODES:     45385-Colonoscopy with snare polypectomy; 45380-59-colonoscopy, flexible, proximal to splenic flexure; with biopsy, single or multiple ICD CODES:     Z12.11 Encounter for screening for malignant neoplasm of colon D12.2 Benign neoplasm of ascending colon D12.0 Benign neoplasm of cecum  The ICD and CPT codes recommended by this software are interpretations from the data  that the clinical staff has captured with the software.  The verification of the translation of this report to the ICD and CPT codes and modifiers is the sole responsibility of the health care institution and practicing physician where this report was generated.  [employer] will not be held responsible for the validity of the ICD and CPT codes included on this report.  AMA assumes no liability for data contained or not contained herein. CPT is a Designer, television/film set of the [employer].  PATIENT NAME:  [name] [name], [name] [name] MR#: [id number]

## 2014-01-19 NOTE — Discharge Instructions (Signed)
Colonoscopy, Care After °These instructions give you information on caring for yourself after your procedure. Your doctor may also give you more specific instructions. Call your doctor if you have any problems or questions after your procedure. °HOME CARE °· Do not drive for 24 hours. °· Do not sign important papers or use machinery for 24 hours. °· You may shower. °· You may go back to your usual activities, but go slower for the first 24 hours. °· Take rest breaks often during the first 24 hours. °· Walk around or use warm packs on your belly (abdomen) if you have belly cramping or gas. °· Drink enough fluids to keep your pee (urine) clear or pale yellow. °· Resume your normal diet. Avoid heavy or fried foods. °· Avoid drinking alcohol for 24 hours or as told by your doctor. °· Only take medicines as told by your doctor. °If a tissue sample (biopsy) was taken during the procedure:  °· Do not take aspirin or blood thinners for 7 days, or as told by your doctor. °· Do not drink alcohol for 7 days, or as told by your doctor. °· Eat soft foods for the first 24 hours. °GET HELP IF: °You still have a small amount of blood in your poop (stool) 2-3 days after the procedure. °GET HELP RIGHT AWAY IF: °· You have more than a small amount of blood in your poop. °· You see clumps of tissue (blood clots) in your poop. °· Your belly is puffy (swollen). °· You feel sick to your stomach (nauseous) or throw up (vomit). °· You have a fever. °· You have belly pain that gets worse and medicine does not help. °MAKE SURE YOU: °· Understand these instructions. °· Will watch your condition. °· Will get help right away if you are not doing well or get worse. °Document Released: 03/10/2010 Document Revised: 02/10/2013 Document Reviewed: 10/13/2012 °ExitCare® Patient Information ©2015 [employer]. This information is not intended to replace advice given to you by your health care provider. Make sure you discuss any questions you have with  your health care provider. ° °

## 2014-01-20 ENCOUNTER — Encounter (HOSPITAL_COMMUNITY): Payer: Self-pay | Admitting: Gastroenterology

## 2014-01-29 ENCOUNTER — Telehealth: Payer: Self-pay

## 2014-01-29 NOTE — Telephone Encounter (Signed)
Called to remind patient to get his flu shot.  [name] states they had their flu shots at [facility] in October.

## 2014-04-21 ENCOUNTER — Ambulatory Visit: Payer: Federal, State, Local not specified - PPO | Admitting: Internal Medicine

## 2014-04-30 ENCOUNTER — Ambulatory Visit: Payer: Federal, State, Local not specified - PPO | Admitting: Family Medicine

## 2014-06-23 ENCOUNTER — Encounter: Payer: Self-pay | Admitting: Internal Medicine

## 2014-06-23 ENCOUNTER — Encounter (INDEPENDENT_AMBULATORY_CARE_PROVIDER_SITE_OTHER): Payer: Self-pay

## 2014-06-23 ENCOUNTER — Ambulatory Visit (INDEPENDENT_AMBULATORY_CARE_PROVIDER_SITE_OTHER): Payer: Federal, State, Local not specified - PPO | Admitting: Internal Medicine

## 2014-06-23 VITALS — BP 142/70 | HR 85 | Ht 71.0 in | Wt 239.8 lb

## 2014-06-23 DIAGNOSIS — G4733 Obstructive sleep apnea (adult) (pediatric): Secondary | ICD-10-CM

## 2014-06-23 DIAGNOSIS — J441 Chronic obstructive pulmonary disease with (acute) exacerbation: Secondary | ICD-10-CM

## 2014-06-23 DIAGNOSIS — Z0000 Encounter for general adult medical examination without abnormal findings: Secondary | ICD-10-CM

## 2014-06-23 DIAGNOSIS — Z720 Tobacco use: Secondary | ICD-10-CM | POA: Diagnosis not present
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
## 2014-06-23 NOTE — Patient Instructions (Signed)
You are doing well with BIPAP 11/8/ [facility], so we can continue that.  Please keep trying to stop smoking- you know you need to.  Order- [facility] referral to primary care to establish, if there is room

## 2014-07-04 ENCOUNTER — Encounter: Payer: Self-pay | Admitting: Internal Medicine

## 2014-07-04 NOTE — Assessment & Plan Note (Signed)
Okay to continue current settings BiPAP 11/8 [facility]

## 2014-07-04 NOTE — Assessment & Plan Note (Signed)
Smoking cessation support available but he is not receptive

## 2014-07-04 NOTE — Assessment & Plan Note (Signed)
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 Cardiac disease is not ruled out. Plan-smoking cessation emphasized. No evident role yet for bronchodilators.

## 2014-07-21 ENCOUNTER — Telehealth: Payer: Self-pay | Admitting: Internal Medicine

## 2014-07-21 NOTE — Telephone Encounter (Signed)
Pt last had clonazepam refilled 06/18/13 #60 x 5 refills Last OV 06/23/14 Please advise thanks

## 2014-07-22 MED ORDER — CLONAZEPAM 0.5 MG PO TABS: 0.5000 mg | ORAL_TABLET | Freq: Every evening | ORAL | Status: DC | PRN

## 2014-07-22 NOTE — Telephone Encounter (Signed)
I have contacted the pharmacy and left Rx refill on voicemail. Nothing more needed at this time.

## 2014-07-23 ENCOUNTER — Encounter: Payer: Self-pay | Admitting: Internal Medicine

## 2014-08-04 ENCOUNTER — Ambulatory Visit: Payer: Medicare Other | Admitting: Internal Medicine

## 2014-08-13 ENCOUNTER — Encounter: Payer: Self-pay | Admitting: Internal Medicine

## 2014-08-13 ENCOUNTER — Ambulatory Visit (INDEPENDENT_AMBULATORY_CARE_PROVIDER_SITE_OTHER): Payer: Medicare Other | Admitting: Internal Medicine

## 2014-08-13 ENCOUNTER — Other Ambulatory Visit (INDEPENDENT_AMBULATORY_CARE_PROVIDER_SITE_OTHER): Payer: Federal, State, Local not specified - PPO

## 2014-08-13 VITALS — BP 148/86 | HR 76 | Temp 98.6°F | Resp 16 | Ht 71.0 in | Wt 237.0 lb

## 2014-08-13 DIAGNOSIS — Z0000 Encounter for general adult medical examination without abnormal findings: Secondary | ICD-10-CM | POA: Diagnosis not present

## 2014-08-13 DIAGNOSIS — R5383 Other fatigue: Secondary | ICD-10-CM

## 2014-08-13 DIAGNOSIS — E789 Disorder of lipoprotein metabolism, unspecified: Secondary | ICD-10-CM

## 2014-08-13 DIAGNOSIS — F4310 Post-traumatic stress disorder, unspecified: Secondary | ICD-10-CM

## 2014-08-13 DIAGNOSIS — R7301 Impaired fasting glucose: Secondary | ICD-10-CM

## 2014-08-13 DIAGNOSIS — Z720 Tobacco use: Secondary | ICD-10-CM

## 2014-08-13 LAB — COMPREHENSIVE METABOLIC PANEL
ALK PHOS: 84 U/L (ref 39–117)
ALT: 63 U/L — AB (ref 0–53)
AST: 39 U/L — ABNORMAL HIGH (ref 0–37)
Albumin: 4 g/dL (ref 3.5–5.2)
BILIRUBIN TOTAL: 0.6 mg/dL (ref 0.2–1.2)
BUN: 13 mg/dL (ref 6–23)
CO2: 28 mEq/L (ref 19–32)
Calcium: 9.1 mg/dL (ref 8.4–10.5)
Chloride: 105 mEq/L (ref 96–112)
Creatinine, Ser: 0.8 mg/dL (ref 0.40–1.50)
GFR: 105.97 mL/min (ref >60.00)
Glucose, Bld: 95 mg/dL (ref 70–99)
Potassium: 3.8 mEq/L (ref 3.5–5.1)
Sodium: 140 mEq/L (ref 135–145)
Total Protein: 6.8 g/dL (ref 6.0–8.3)

## 2014-08-13 LAB — LIPID PANEL
CHOL/HDL RATIO: 6
CHOLESTEROL: 190 mg/dL (ref 0–200)
HDL: 32.3 mg/dL — AB (ref >39.00)

## 2014-08-13 LAB — CBC
HCT: 42.5 % (ref 39.0–52.0)
Hemoglobin: 14.4 g/dL (ref 13.0–17.0)
MCHC: 34 g/dL (ref 30.0–36.0)
MCV: 101.7 fl — ABNORMAL HIGH (ref 78.0–100.0)
PLATELETS: 170 10*3/uL (ref 150.0–400.0)
RBC: 4.17 Mil/uL — AB (ref 4.22–5.81)
RDW: 14.4 % (ref 11.5–15.5)
WBC: 6.6 10*3/uL (ref 4.0–10.5)

## 2014-08-13 LAB — HEMOGLOBIN A1C: HEMOGLOBIN A1C: 5.7 % (ref 4.6–6.5)

## 2014-08-13 LAB — LDL CHOLESTEROL, DIRECT: LDL DIRECT: 36 mg/dL
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
## 2014-08-13 NOTE — Progress Notes (Signed)
Pre visit review using our clinic review tool, if applicable. No additional management support is needed unless otherwise documented below in the visit note. 

## 2014-08-13 NOTE — Patient Instructions (Signed)
We are going to check on your blood work today and call you back with the results.   We will help you find a psychiatrist to start seeing.   Come back in about 6-12 months for a check in. If you have any problems or questions before then please feel free to call the office.   Health Maintenance A healthy lifestyle and preventative care can promote health and wellness.  Maintain regular health, dental, and eye exams.  Eat a healthy diet. Foods like vegetables, fruits, whole grains, low-fat dairy products, and lean protein foods contain the nutrients you need and are low in calories. Decrease your intake of foods high in solid fats, added sugars, and salt. Get information about a proper diet from your health care provider, if necessary.  Regular physical exercise is one of the most important things you can do for your health. Most adults should get at least 150 minutes of moderate-intensity exercise (any activity that increases your heart rate and causes you to sweat) each week. In addition, most adults need muscle-strengthening exercises on 2 or more days a week.   Maintain a healthy weight. The body mass index (BMI) is a screening tool to identify possible weight problems. It provides an estimate of body fat based on height and weight. Your health care provider can find your BMI and can help you achieve or maintain a healthy weight. For males 20 years and older:  A BMI below 18.5 is considered underweight.  A BMI of 18.5 to 24.9 is normal.  A BMI of 25 to 29.9 is considered overweight.  A BMI of 30 and above is considered obese.  Maintain normal blood lipids and cholesterol by exercising and minimizing your intake of saturated fat. Eat a balanced diet with plenty of fruits and vegetables. Blood tests for lipids and cholesterol should begin at age 75 and be repeated every 5 years. If your lipid or cholesterol levels are high, you are over age 59, or you are at high risk for heart disease,  you may need your cholesterol levels checked more frequently.Ongoing high lipid and cholesterol levels should be treated with medicines if diet and exercise are not working.  If you smoke, find out from your health care provider how to quit. If you do not use tobacco, do not start.  Lung cancer screening is recommended for adults aged 31-80 years who are at high risk for developing lung cancer because of a history of smoking. A yearly low-dose CT scan of the lungs is recommended for people who have at least a 30-pack-year history of smoking and are current smokers or have quit within the past 15 years. A pack year of smoking is smoking an average of 1 pack of cigarettes a day for 1 year (for example, a 30-pack-year history of smoking could mean smoking 1 pack a day for 30 years or 2 packs a day for 15 years). Yearly screening should continue until the smoker has stopped smoking for at least 15 years. Yearly screening should be stopped for people who develop a health problem that would prevent them from having lung cancer treatment.  If you choose to drink alcohol, do not have more than 2 drinks per day. One drink is considered to be 12 oz (360 mL) of beer, 5 oz (150 mL) of wine, or 1.5 oz (45 mL) of liquor.  Avoid the use of street drugs. Do not share needles with anyone. Ask for help if you need support or instructions  about stopping the use of drugs.  High blood pressure causes heart disease and increases the risk of stroke. Blood pressure should be checked at least every 1-2 years. Ongoing high blood pressure should be treated with medicines if weight loss and exercise are not effective.  If you are 34-78 years old, ask your health care provider if you should take aspirin to prevent heart disease.  Diabetes screening involves taking a blood sample to check your fasting blood sugar level. This should be done once every 3 years after age 39 if you are at a normal weight and without risk factors for  diabetes. Testing should be considered at a younger age or be carried out more frequently if you are overweight and have at least 1 risk factor for diabetes.  Colorectal cancer can be detected and often prevented. Most routine colorectal cancer screening begins at the age of 72 and continues through age 62. However, your health care provider may recommend screening at an earlier age if you have risk factors for colon cancer. On a yearly basis, your health care provider may provide home test kits to check for hidden blood in the stool. A small camera at the end of a tube may be used to directly examine the colon (sigmoidoscopy or colonoscopy) to detect the earliest forms of colorectal cancer. Talk to your health care provider about this at age 56 when routine screening begins. A direct exam of the colon should be repeated every 5-10 years through age 66, unless early forms of precancerous polyps or small growths are found.  People who are at an increased risk for hepatitis B should be screened for this virus. You are considered at high risk for hepatitis B if:  You were born in a country where hepatitis B occurs often. Talk with your health care provider about which countries are considered high risk.  Your parents were born in a high-risk country and you have not received a shot to protect against hepatitis B (hepatitis B vaccine).  You have HIV or AIDS.  You use needles to inject street drugs.  You live with, or have sex with, someone who has hepatitis B.  You are a man who has sex with other men (MSM).  You get hemodialysis treatment.  You take certain medicines for conditions like cancer, organ transplantation, and autoimmune conditions.  Hepatitis C blood testing is recommended for all people born from [birth year] through 1963 and any individual with known risk factors for hepatitis C.  Healthy men should no longer receive prostate-specific antigen (PSA) blood tests as part of routine cancer  screening. Talk to your health care provider about prostate cancer screening.  Testicular cancer screening is not recommended for adolescents or adult males who have no symptoms. Screening includes self-exam, a health care provider exam, and other screening tests. Consult with your health care provider about any symptoms you have or any concerns you have about testicular cancer.  Practice safe sex. Use condoms and avoid high-risk sexual practices to reduce the spread of sexually transmitted infections (STIs).  You should be screened for STIs, including gonorrhea and chlamydia if:  You are sexually active and are younger than 24 years.  You are older than 24 years, and your health care provider tells you that you are at risk for this type of infection.  Your sexual activity has changed since you were last screened, and you are at an increased risk for chlamydia or gonorrhea. Ask your health care provider if you  are at risk.  If you are at risk of being infected with HIV, it is recommended that you take a prescription medicine daily to prevent HIV infection. This is called pre-exposure prophylaxis (PrEP). You are considered at risk if:  You are a man who has sex with other men (MSM).  You are a heterosexual man who is sexually active with multiple partners.  You take drugs by injection.  You are sexually active with a partner who has HIV.  Talk with your health care provider about whether you are at high risk of being infected with HIV. If you choose to begin PrEP, you should first be tested for HIV. You should then be tested every 3 months for as long as you are taking PrEP.  Use sunscreen. Apply sunscreen liberally and repeatedly throughout the day. You should seek shade when your shadow is shorter than you. Protect yourself by wearing long sleeves, pants, a wide-brimmed hat, and sunglasses year round whenever you are outdoors.  Tell your health care provider of new moles or changes in  moles, especially if there is a change in shape or color. Also, tell your health care provider if a mole is larger than the size of a pencil eraser.  A one-time screening for abdominal aortic aneurysm (AAA) and surgical repair of large AAAs by ultrasound is recommended for men aged 55-75 years who are current or former smokers.  Stay current with your vaccines (immunizations). Document Released: 08/04/2007 Document Revised: 02/10/2013 Document Reviewed: 07/03/2010 [facility] Patient Information 2015 [place], [place]. This information is not intended to replace advice given to you by your health care provider. Make sure you discuss any questions you have with your health care provider.

## 2014-08-13 NOTE — Assessment & Plan Note (Signed)
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

## 2014-08-13 NOTE — Assessment & Plan Note (Signed)
Seeing psychiatrist in [place] at this time. When reviewing the [place] narcotic database concerning trend. Previously on lorazepam 30 per month and clonazepam 60 per month along with risperdal 30 per month. In the last 4 months lorazepam increased to 60 per month and clonazepam stayed 60 per month. He is now getting lorazepam from pulmonology. Would feel that given his concurrent alcohol overuse he needs to be off one benzo. He is high risk for OD with concurrent high alcohol intake. Will forward to pulmonology for their information. Referral placed to psychiatry here and hopefully we can get him some counseling along with medication management to handle his anxiety and PTSD instead of masking with overmedication.

## 2014-09-08 ENCOUNTER — Telehealth: Payer: Self-pay | Admitting: Internal Medicine

## 2014-09-08 NOTE — Telephone Encounter (Signed)
Called spoke with spouse. She reports she received a letter from [facility] about bills back from April-June 2015 denying CPAP machine. Was advised they need a current CMN? Reports we submitted one from 2010. Pt did receive a new machine last year. I advised her a CMN comes from the DME. I will call [employer] and see what's going on. Called [employer] and received answering service. WCB

## 2014-09-08 NOTE — Telephone Encounter (Signed)
Called spoke with Arbie Cookey. She is not showing anything is needed from our office and she will call pt spouse to discuss. Called made spouse aware and nothing further needed

## 2014-09-08 NOTE — Telephone Encounter (Signed)
Called spoke with Arbie Cookey w/ [employer]. Made her aware of below. She is going to call billing to get an update. Will await call back

## 2014-09-08 NOTE — Telephone Encounter (Signed)
[phone] Patten cb

## 2015-01-18 ENCOUNTER — Ambulatory Visit (INDEPENDENT_AMBULATORY_CARE_PROVIDER_SITE_OTHER): Payer: Federal, State, Local not specified - PPO | Admitting: Internal Medicine

## 2015-01-18 ENCOUNTER — Encounter: Payer: Self-pay | Admitting: Internal Medicine

## 2015-01-18 VITALS — BP 162/82 | HR 99 | Temp 98.6°F | Resp 18 | Ht 71.0 in | Wt 247.0 lb

## 2015-01-18 DIAGNOSIS — F4310 Post-traumatic stress disorder, unspecified: Secondary | ICD-10-CM

## 2015-01-18 DIAGNOSIS — R1011 Right upper quadrant pain: Secondary | ICD-10-CM

## 2015-01-18 NOTE — Patient Instructions (Signed)
We will check the ultrasound of the stomach to make sure you are not having gallbladder problems.   It will be at the imaging center and someone will call with the place and time.

## 2015-01-18 NOTE — Progress Notes (Signed)
Pre visit review using our clinic review tool, if applicable. No additional management support is needed unless otherwise documented below in the visit note. 

## 2015-01-19 DIAGNOSIS — R1011 Right upper quadrant pain: Secondary | ICD-10-CM | POA: Insufficient documentation

## 2015-01-19 NOTE — Progress Notes (Signed)
   Subjective:    Patient ID: [name] [name], male    DOB: 11/14/1955, 59 y.o.   MRN: [id number]  HPI The patient is a 59 YO man coming in for follow up. He is drinking less and has switched from vodka to beer to help himself slow down. Drinking beer has caused him to put on some weight. He is having some pains in his stomach with eating certain foods like fatty foods and the pain goes into his shoulder blade. He denies nausea or vomiting. No diarrhea. He has still been trying to find a psych down in [place] (his is in [place]) but since he is on workman's comp for this it is difficult to switch providers.   Review of Systems  Constitutional: Negative for fever, activity change, appetite change, fatigue and unexpected weight change.  Respiratory: Negative for cough, chest tightness and shortness of breath.   Cardiovascular: Negative for chest pain, palpitations and leg swelling.  Gastrointestinal: Positive for abdominal pain. Negative for nausea, diarrhea, constipation and abdominal distention.  Musculoskeletal: Positive for myalgias. Negative for back pain and arthralgias.  Skin: Negative.   Neurological: Negative.   Psychiatric/Behavioral: Positive for sleep disturbance, dysphoric mood, decreased concentration and agitation. The patient is nervous/anxious.       Objective:   Physical Exam  Constitutional: He is oriented to person, place, and time. He appears well-developed and well-nourished.  Overweight  HENT:  Head: Normocephalic and atraumatic.  Eyes: EOM are normal.  Neck: Normal range of motion.  Cardiovascular: Normal rate and regular rhythm.   Pulmonary/Chest: Effort normal and breath sounds normal.  Abdominal: Soft. He exhibits no distension. There is no tenderness. There is no rebound.  Musculoskeletal: He exhibits no edema.  Neurological: He is alert and oriented to person, place, and time.  Skin: Skin is warm and dry.  Psychiatric:  Somewhat anxious during the visit   Filed  Vitals:   01/18/15 1610  BP: 162/82  Pulse: 99  Temp: 98.6 F (37 C)  TempSrc: Oral  Resp: 18  Height: 5\' 11"  (1.803 m)  Weight: 247 lb (112.038 kg)  SpO2: 97%      Assessment & Plan:

## 2015-01-19 NOTE — Assessment & Plan Note (Signed)
Still self-medicating with alcohol and has switched to beer but did not want to discuss how many per day he is drinking. Would be nice if he could get a psych in town who could help him come off some of his medications. Still discussed with them to discuss with their psych that he is overmedicated and asked them again to take him off gradually one of the two benzos he is taking and reminded them that with the concurrent alcohol he is at high risk for accidental OD.

## 2015-01-19 NOTE — Assessment & Plan Note (Signed)
Get ultrasound abdomen to rule out gallbladder disease and also evaluate his liver. Possible that the shoulder blade pain could be related to gallbladder along with symptoms with eating.

## 2015-01-27 ENCOUNTER — Other Ambulatory Visit: Payer: Federal, State, Local not specified - PPO

## 2015-01-28 ENCOUNTER — Ambulatory Visit
Admission: RE | Admit: 2015-01-28 | Discharge: 2015-01-28 | Disposition: A | Discharge status: Home / Self Care | Payer: Federal, State, Local not specified - PPO | Source: Ambulatory Visit | Attending: Internal Medicine | Admitting: Internal Medicine

## 2015-01-28 DIAGNOSIS — R1011 Right upper quadrant pain: Secondary | ICD-10-CM

## 2015-02-02 ENCOUNTER — Telehealth: Payer: Self-pay | Admitting: Internal Medicine

## 2015-02-02 DIAGNOSIS — R197 Diarrhea, unspecified: Secondary | ICD-10-CM

## 2015-02-02 DIAGNOSIS — R32 Unspecified urinary incontinence: Secondary | ICD-10-CM

## 2015-02-02 NOTE — Telephone Encounter (Signed)
Pt request ultrasound result that was done Friday. Please give him a call back

## 2015-02-04 ENCOUNTER — Other Ambulatory Visit: Payer: Self-pay | Admitting: Internal Medicine

## 2015-02-04 NOTE — Telephone Encounter (Signed)
Sent via mychart

## 2015-02-04 NOTE — Telephone Encounter (Signed)
Ok to refill 

## 2015-02-04 NOTE — Telephone Encounter (Signed)
CY please advise on refill. Thanks. 

## 2015-02-07 NOTE — Telephone Encounter (Signed)
Called pharmacy to give verbal refill auth-was told by pharmacy that patient just got Lorazepam filled by a Dr Nadara Eaton as well. Per CY we will NOT refill clonazepam as requested at this time, Nothing more needed.

## 2015-02-07 NOTE — Telephone Encounter (Signed)
Ok to refill 

## 2015-02-09 NOTE — Telephone Encounter (Signed)
I called and spoke with [name] [name] and cleared up her concerns. She wanted to know if something was wrong with the gallbladder. Patient would like a referral after the holidays to see urology. Patient keeps going to the bathroom on himself.

## 2015-02-09 NOTE — Telephone Encounter (Signed)
Pt wife called and would like a nurse to call her and explain the result because they do not understand

## 2015-02-15 NOTE — Addendum Note (Signed)
Addended by: Pricilla Holm A on: 02/15/2015 10:21 AM   Modules accepted: Orders

## 2015-02-15 NOTE — Telephone Encounter (Signed)
Referral to GI placed

## 2015-02-15 NOTE — Telephone Encounter (Signed)
Patient aware of GI referral. 

## 2015-02-15 NOTE — Telephone Encounter (Signed)
Referral placed.

## 2015-02-15 NOTE — Telephone Encounter (Signed)
I called to speak to patient's wife to let her know that the referral for urology has been placed and she said that she needs GI instead. The patient is having diarrhea and it comes out all the time. She is thinking it is his gallbladder. What do you suggest? She said they mentioned it to you at their last office visit. [name] [name] is saying that it is very stressful right know with her husband with him going to the bathroom on himself. Please advise, thanks.

## 2015-04-13 ENCOUNTER — Other Ambulatory Visit: Payer: Self-pay | Admitting: Internal Medicine

## 2015-04-13 MED ORDER — CLONAZEPAM 0.5 MG PO TABS: 0.5000 mg | ORAL_TABLET | Freq: Every evening | ORAL | Status: DC | PRN

## 2015-04-13 NOTE — Telephone Encounter (Signed)
Per CY-okay to refill and signed paper request has been faxed to [facility] at [phone].

## 2015-05-23 ENCOUNTER — Telehealth: Payer: Self-pay | Admitting: Internal Medicine

## 2015-05-23 MED ORDER — ALBUTEROL SULFATE HFA 108 (90 BASE) MCG/ACT IN AERS
2.0000 | INHALATION_SPRAY | Freq: Four times a day (QID) | RESPIRATORY_TRACT | Status: DC | PRN
Start: 2015-05-23 — End: 2016-06-22

## 2015-05-23 NOTE — Telephone Encounter (Signed)
Spoke with pt's wife, requesting refill on rescue inhaler.  This has been sent to preferred pharmacy.  Nothing further needed.

## 2015-06-23 ENCOUNTER — Ambulatory Visit (INDEPENDENT_AMBULATORY_CARE_PROVIDER_SITE_OTHER)
Admission: RE | Admit: 2015-06-23 | Discharge: 2015-06-23 | Disposition: A | Discharge status: Home / Self Care | Payer: Federal, State, Local not specified - PPO | Source: Ambulatory Visit | Attending: Internal Medicine | Admitting: Internal Medicine

## 2015-06-23 ENCOUNTER — Ambulatory Visit (INDEPENDENT_AMBULATORY_CARE_PROVIDER_SITE_OTHER): Payer: Federal, State, Local not specified - PPO | Admitting: Internal Medicine

## 2015-06-23 ENCOUNTER — Encounter: Payer: Self-pay | Admitting: Internal Medicine

## 2015-06-23 VITALS — BP 122/82 | HR 87 | Ht 71.0 in | Wt 253.6 lb

## 2015-06-23 DIAGNOSIS — G4733 Obstructive sleep apnea (adult) (pediatric): Secondary | ICD-10-CM

## 2015-06-23 DIAGNOSIS — J441 Chronic obstructive pulmonary disease with (acute) exacerbation: Secondary | ICD-10-CM

## 2015-06-23 DIAGNOSIS — Z720 Tobacco use: Secondary | ICD-10-CM

## 2015-06-23 DIAGNOSIS — J449 Chronic obstructive pulmonary disease, unspecified: Secondary | ICD-10-CM

## 2015-06-23 MED ORDER — UMECLIDINIUM-VILANTEROL 62.5-25 MCG/INH IN AEPB: 1.0000 | INHALATION_SPRAY | Freq: Every day | RESPIRATORY_TRACT | Status: DC

## 2015-06-23 NOTE — Patient Instructions (Signed)
You are doing very well with CPAP. As discussed, we can continue as you are doing now with autoPAP 5-15  A reminder- please do everything you can to stop smoking, before it stops you !  Order- CXR   Dx Tobacco user  Sample Anoro Ellipta maintenance inhaler for trial     Inhale 1 puff, once daily. See if it helps your breathing.
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
## 2015-06-23 NOTE — Assessment & Plan Note (Signed)
He is now using CPAP auto 5-15 with excellent download and good quality of life improvement.

## 2015-06-23 NOTE — Assessment & Plan Note (Signed)
Encouraged to stop but he is not prepared yet to make that effort

## 2015-06-23 NOTE — Assessment & Plan Note (Signed)
Minimal chronic bronchitis clinically Plan-sample Anoro inhaler for trial, schedule chest x-ray

## 2015-08-15 ENCOUNTER — Telehealth: Payer: Self-pay | Admitting: Internal Medicine

## 2015-08-16 NOTE — Telephone Encounter (Signed)
[name] [name], has this been taken care of?  Please advise.

## 2015-08-22 NOTE — Telephone Encounter (Signed)
[name] [name] - Do you know if this has been fixed? Thanks!

## 2015-10-15 ENCOUNTER — Other Ambulatory Visit: Payer: Self-pay | Admitting: Internal Medicine

## 2015-10-19 ENCOUNTER — Telehealth: Payer: Self-pay | Admitting: Internal Medicine

## 2015-10-19 ENCOUNTER — Other Ambulatory Visit: Payer: Self-pay | Admitting: Internal Medicine

## 2015-10-19 NOTE — Telephone Encounter (Signed)
Per SN: I will only fill #14 until Dr Annamaria Boots returns: 1 po BID prn.  It's not a good idea to mix these types of medications.  Thanks.

## 2015-10-19 NOTE — Telephone Encounter (Signed)
Spoke with pt's wife, [name]. States that pt needs a refill on Clonazepam. This was last refilled on 04/13/15 #60 by CY. Pt's wife said that their pharmacy told them we denied this prescription refill due to having another similar medication on his medication list >> Lorazepam. She states that pt has been on both medications for years and CY is aware of this.  SN - would you be willing to refill this prescription? Thanks.

## 2015-10-20 MED ORDER — CLONAZEPAM 0.5 MG PO TABS: 0.5000 mg | ORAL_TABLET | Freq: Two times a day (BID) | ORAL | 0 refills | Status: DC | PRN

## 2015-10-20 NOTE — Telephone Encounter (Signed)
Spoke with the pt's spouse and notified of recs per SN  She verbalized understanding and nothing further needed  Rx called to pharm

## 2015-11-14 ENCOUNTER — Other Ambulatory Visit: Payer: Self-pay | Admitting: Pulmonary Disease

## 2015-11-16 ENCOUNTER — Other Ambulatory Visit: Payer: Self-pay | Admitting: Internal Medicine

## 2015-11-17 NOTE — Telephone Encounter (Signed)
Please advise on refill. Thanks. 

## 2015-11-18 ENCOUNTER — Other Ambulatory Visit: Payer: Self-pay | Admitting: Internal Medicine

## 2015-11-18 ENCOUNTER — Other Ambulatory Visit: Payer: Self-pay | Admitting: Pulmonary Disease

## 2015-11-18 NOTE — Telephone Encounter (Signed)
Ok to refill 6 months 

## 2015-11-23 ENCOUNTER — Telehealth: Payer: Self-pay | Admitting: Internal Medicine

## 2015-11-23 MED ORDER — CLONAZEPAM 0.5 MG PO TABS: ORAL_TABLET | ORAL | 5 refills | Status: DC

## 2015-11-23 NOTE — Telephone Encounter (Signed)
Ok to refill his clonazepam # 60, 1 twice daily, ref x 5

## 2015-11-23 NOTE — Telephone Encounter (Signed)
Called rx to the pharmacy for the pt per his wife. Nothing further is needed.

## 2015-11-23 NOTE — Telephone Encounter (Signed)
Spoke with pt's wife. She is calling about pt's clonazepam prescription. This was refilled on 11/18/15 for #14 tablets instead of #60. They need to have this prescription fixed. Pt's wife is going to go ahead and pick up the prescription for #14 since the pt is completely out.  CY - please advise if we can call in a new prescription. Thanks.

## 2016-01-15 ENCOUNTER — Other Ambulatory Visit: Payer: Self-pay | Admitting: Internal Medicine

## 2016-01-17 NOTE — Telephone Encounter (Signed)
CY Please advise on refill. Thanks.  

## 2016-01-17 NOTE — Telephone Encounter (Signed)
Ok to refill 6 months 

## 2016-01-30 ENCOUNTER — Emergency Department (HOSPITAL_COMMUNITY)
Admission: EM | Admit: 2016-01-30 | Discharge: 2016-01-30 | Disposition: A | Discharge status: Home / Self Care | Payer: Federal, State, Local not specified - PPO | Attending: Emergency Medicine | Admitting: Emergency Medicine

## 2016-01-30 ENCOUNTER — Encounter (HOSPITAL_COMMUNITY): Payer: Self-pay | Admitting: Emergency Medicine

## 2016-01-30 ENCOUNTER — Telehealth: Payer: Self-pay | Admitting: Internal Medicine

## 2016-01-30 ENCOUNTER — Emergency Department (HOSPITAL_COMMUNITY): Payer: Federal, State, Local not specified - PPO

## 2016-01-30 DIAGNOSIS — I10 Essential (primary) hypertension: Secondary | ICD-10-CM | POA: Diagnosis not present

## 2016-01-30 DIAGNOSIS — Z5321 Procedure and treatment not carried out due to patient leaving prior to being seen by health care provider: Secondary | ICD-10-CM | POA: Diagnosis not present

## 2016-01-30 NOTE — ED Notes (Signed)
Called to lobby, pt wife stating waiting for a long time , also reported pt is having shortness of breath going up stairs. Chest xray will be ordered by protocol.

## 2016-01-30 NOTE — ED Notes (Signed)
Patient and family member complained about not being seen in a timely manner and wanted to leave. Pt is alert, oriented x 4 and appears in acute distress. Pt reports it is past his bedtime and wants to go home. Pt is aware of risk of leaving and signed AMA form. Offered patient a hallway bed until another room was available and patient declined offer. Pt ambulated out of facility with a steady gait.

## 2016-01-30 NOTE — Telephone Encounter (Signed)
Patient Name: [name] DOB: Jul 15, 1955 Initial Comment Caller states her husband has bp of 172/98. He also just jammed his finger and it is now warm and swollen to the touch. Nurse Assessment Nurse: Vallery Sa, RN, Cathy Date/Time (Eastern Time): 01/30/2016 2:23:45 PM Confirm and document reason for call. If symptomatic, describe symptoms. ---Caller states [name] [name]'s blood pressure was 172/98 about an hour ago. He injured his left middle finger about 2 days ago (pain rated as moderate when touched) and the finger became more swollen last night. No fever. Alert and responsive. Does the patient have any new or worsening symptoms? ---Yes Will a triage be completed? ---Yes Related visit to physician within the last 2 weeks? ---No Does the PT have any chronic conditions? (i.e. diabetes, asthma, etc.) ---Yes List chronic conditions. ---COPD, Sleep Apnea, PTSD Is this a behavioral health or substance abuse call? ---No Guidelines Guideline Title Affirmed Question Affirmed Notes High Blood Pressure [1] BP # 160 / 100 AND [2] cardiac or neurologic symptoms (e.g., chest pain, difficulty breathing, unsteady gait, blurred vision) Final Disposition User Go to ED Now Vallery Sa, RN, [facility] - ED Disagree/Comply: Comply

## 2016-01-30 NOTE — ED Triage Notes (Addendum)
Patient reports he was sent by psychiatrist for elevated BP, 172/98. Patient also reports dull headache and bilateral ankle swelling x3 days. Denies chest pain and SOB.

## 2016-01-30 NOTE — ED Notes (Signed)
Called from the lobby, patient's wife stating patient's right eye is swelling. Patient denies burning, pain, and irritation to eye. Speaking in full sentences without difficulty.

## 2016-01-31 ENCOUNTER — Ambulatory Visit (INDEPENDENT_AMBULATORY_CARE_PROVIDER_SITE_OTHER): Payer: Federal, State, Local not specified - PPO | Admitting: Internal Medicine

## 2016-01-31 ENCOUNTER — Encounter: Payer: Self-pay | Admitting: Internal Medicine

## 2016-01-31 ENCOUNTER — Other Ambulatory Visit (INDEPENDENT_AMBULATORY_CARE_PROVIDER_SITE_OTHER): Payer: Federal, State, Local not specified - PPO

## 2016-01-31 VITALS — BP 188/104 | HR 73 | Temp 98.4°F | Resp 18 | Ht 71.0 in | Wt 256.0 lb

## 2016-01-31 DIAGNOSIS — I10 Essential (primary) hypertension: Secondary | ICD-10-CM | POA: Diagnosis not present

## 2016-01-31 DIAGNOSIS — R030 Elevated blood-pressure reading, without diagnosis of hypertension: Secondary | ICD-10-CM

## 2016-01-31 DIAGNOSIS — Z789 Other specified health status: Secondary | ICD-10-CM

## 2016-01-31 DIAGNOSIS — Z7289 Other problems related to lifestyle: Secondary | ICD-10-CM

## 2016-01-31 LAB — COMPREHENSIVE METABOLIC PANEL
ALBUMIN: 4.2 g/dL (ref 3.5–5.2)
ALK PHOS: 148 U/L — AB (ref 39–117)
ALT: 61 U/L — ABNORMAL HIGH (ref 0–53)
AST: 39 U/L — ABNORMAL HIGH (ref 0–37)
BUN: 7 mg/dL (ref 6–23)
CALCIUM: 9.3 mg/dL (ref 8.4–10.5)
CHLORIDE: 101 meq/L (ref 96–112)
CO2: 27 mEq/L (ref 19–32)
CREATININE: 0.75 mg/dL (ref 0.40–1.50)
GFR: 113.57 mL/min (ref >60.00)
Glucose, Bld: 127 mg/dL — ABNORMAL HIGH (ref 70–99)
POTASSIUM: 3.8 meq/L (ref 3.5–5.1)
Sodium: 136 mEq/L (ref 135–145)
Total Bilirubin: 0.4 mg/dL (ref 0.2–1.2)
Total Protein: 7.1 g/dL (ref 6.0–8.3)

## 2016-01-31 LAB — LIPID PANEL
Cholesterol: 179 mg/dL (ref 0–200)
HDL: 40.8 mg/dL (ref >39.00)
NonHDL: 137.8
TRIGLYCERIDES: 330 mg/dL — AB (ref 0.0–149.0)
Total CHOL/HDL Ratio: 4
VLDL: 66 mg/dL — ABNORMAL HIGH (ref 0.0–40.0)

## 2016-01-31 LAB — LDL CHOLESTEROL, DIRECT: LDL DIRECT: 92 mg/dL

## 2016-01-31 LAB — HEMOGLOBIN A1C: HEMOGLOBIN A1C: 6.6 % — AB (ref 4.6–6.5)

## 2016-01-31 MED ORDER — AMLODIPINE BESYLATE 5 MG PO TABS: 5.0000 mg | ORAL_TABLET | Freq: Every day | ORAL | 3 refills | Status: DC

## 2016-01-31 NOTE — Patient Instructions (Addendum)
We have sent in amlodipine for you to take 1 pill daily for the next 2 weeks.   After that you can come off the medicine and monitor the blood pressure at home (or in a grocery store) to see if it comes back to normal.   We will send the lab results to you and also to the [facility].   Congratulations on stopping alcohol. This is a great thing for your health and should help keep the blood pressure lower over time.    Alcohol Withdrawal Alcohol withdrawal is a group of symptoms that can develop when a person who drinks heavily and regularly stops drinking or drinks less. What are the causes? Heavy and regular drinking can cause chemicals that send signals from the brain to the body (neurotransmitters) to deactivate. Alcohol withdrawal develops when deactivated neurotransmitters reactivate because a person stops drinking or drinks less. What are the signs or symptoms? Symptoms of this condition can be mild to moderate, or they can be severe. Mild to moderate symptoms may include:  Fatigue.  Nightmares.  Trouble sleeping.  Depression.  Anxiety.  Inability to think clearly.  Mood swings.  Irritability.  Loss of appetite.  Nausea or vomiting.  High blood pressure  Clammy skin.  Extreme sweating.  Rapid heartbeat.  Shakiness.  Uncontrollable shaking (tremor). Severe symptoms may include:  Fever.  Seizures.  Severeconfusion.  Feeling or seeing things that are not there (hallucinations). Symptoms usually begin within eight hours after a person stops drinking or drinks less. They can last for weeks. How is this diagnosed? Alcohol withdrawal is diagnosed with a medical history and physical exam. Sometimes, urine and blood tests are also done. How is this treated? Treatment may involve:  Monitoring blood pressure, pulse, and breathing.  Getting fluids through an IV tube.  Medicine to reduce anxiety.  Medicine to prevent or control seizures.  Multivitamins and  B vitamins.  Having a health care provider check on you daily. If symptoms are moderate to severe or if there is a risk of severe withdrawal, treatment may be done at a hospital or treatment center. Follow these instructions at home:  Take medicines and vitamin supplements only as directed by your health care provider.  Do not drink alcohol.  Have someone stay with you or be available if you need help.  Drink enough fluid to keep your urine clear or pale yellow.  Consider joining a 12-step program or another alcohol support group. Contact a health care provider if:  Your symptoms get worse or do not go away.  You cannot keep food or water in your stomach.  You are struggling with not drinking alcohol.  You cannot stop drinking alcohol. Get help right away if:  You have an irregular heartbeat.  You have chest pain.  You have trouble breathing.  You have symptoms of severe withdrawal, such as:  A fever.  Seizures.  Severe confusion.  Hallucinations. This information is not intended to replace advice given to you by your health care provider. Make sure you discuss any questions you have with your health care provider. Document Released: 11/15/2004 Document Revised: 06/15/2015 Document Reviewed: 11/24/2013 Elsevier Interactive Patient Education  2017 [employer].

## 2016-01-31 NOTE — Progress Notes (Signed)
Pre visit review using our clinic review tool, if applicable. No additional management support is needed unless otherwise documented below in the visit note. 

## 2016-01-31 NOTE — Progress Notes (Signed)
   Subjective:    Patient ID: [name] [name], male    DOB: 05-29-1955, 60 y.o.   MRN: [id number]  HPI The patient is a 60 YO man coming in for high BP at home. He was sent by his psychiatrist yesterday to the ER for high BP. He does not take any blood pressure medication at home usually. Has not been seen in more than 1 year at this office. He was having some SOB and headache yesterday with the high blood pressure. He is still having mild headache today. He did stop drinking alcohol about 6 days ago (and had excessive intake per him) but tapered down slowly. He has had some diarrhea since that time and the headache. He normally has normal blood pressure and feels that the high blood pressure is since then.   Fax results to [phone]  Review of Systems  Constitutional: Negative for activity change, appetite change, chills, fatigue, fever and unexpected weight change.  HENT: Negative.   Eyes: Negative.   Respiratory: Negative.   Cardiovascular: Negative.   Gastrointestinal: Positive for diarrhea. Negative for abdominal distention, abdominal pain, constipation and vomiting.  Musculoskeletal: Negative.   Skin: Negative.   Neurological: Positive for headaches. Negative for dizziness, weakness, light-headedness and numbness.  Psychiatric/Behavioral: Negative.       Objective:   Physical Exam  Constitutional: He is oriented to person, place, and time. He appears well-developed and well-nourished.  Overweight  HENT:  Head: Normocephalic and atraumatic.  Eyes: EOM are normal.  Neck: Normal range of motion.  Cardiovascular: Normal rate and regular rhythm.   Pulmonary/Chest: Effort normal and breath sounds normal. No respiratory distress. He has no wheezes. He has no rales.  Abdominal: Soft. He exhibits no distension. There is no tenderness. There is no rebound and no guarding.  Musculoskeletal: He exhibits no edema.  Neurological: He is alert and oriented to person, place, and time.  Coordination normal.  Skin: Skin is warm and dry.   Vitals:   01/31/16 1555  BP: (!) 188/104  Pulse: 73  Resp: 18  Temp: 98.4 F (36.9 C)  TempSrc: Oral  SpO2: 97%  Weight: 256 lb (116.1 kg)  Height: 5\' 11"  (1.803 m)      Assessment & Plan:

## 2016-02-01 DIAGNOSIS — F1010 Alcohol abuse, uncomplicated: Secondary | ICD-10-CM | POA: Insufficient documentation

## 2016-02-01 DIAGNOSIS — R030 Elevated blood-pressure reading, without diagnosis of hypertension: Secondary | ICD-10-CM | POA: Insufficient documentation

## 2016-02-01 DIAGNOSIS — F1021 Alcohol dependence, in remission: Secondary | ICD-10-CM | POA: Insufficient documentation

## 2016-02-01 LAB — CBC
HEMATOCRIT: 39.3 % (ref 39.0–52.0)
Hemoglobin: 13.6 g/dL (ref 13.0–17.0)
MCHC: 34.7 g/dL (ref 30.0–36.0)
MCV: 99.4 fl (ref 78.0–100.0)
PLATELETS: 183 10*3/uL (ref 150.0–400.0)
RBC: 3.95 Mil/uL — AB (ref 4.22–5.81)
RDW: 14.6 % (ref 11.5–15.5)
WBC: 7.9 10*3/uL (ref 4.0–10.5)

## 2016-02-01 LAB — TSH: TSH: 1.54 u[IU]/mL (ref 0.35–4.50)

## 2016-02-01 LAB — TROPONIN I: TNIDX: 0 ug/l (ref 0.00–0.06)

## 2016-02-01 LAB — VITAMIN B12: VITAMIN B 12: 909 pg/mL (ref 211–911)

## 2016-02-01 NOTE — Assessment & Plan Note (Signed)
Possibly due to alcohol withdrawal as BP are normal in the past. Rx for amlodipine for short term and then close return visit for monitoring.

## 2016-02-01 NOTE — Assessment & Plan Note (Signed)
Stopped 6 days ago, no signs of DTs and has clonazepam from psych that he can take. Likely is past more of the withdrawal but his blood pressure is still elevated.

## 2016-03-13 ENCOUNTER — Telehealth: Payer: Self-pay | Admitting: Internal Medicine

## 2016-03-13 NOTE — Telephone Encounter (Signed)
CXR- showed changes of COPD with some old scarring but nothing that looked acute at the time

## 2016-03-13 NOTE — Telephone Encounter (Signed)
Called and spoke with pts wife and she is aware of results of CY looking at the cxr. Nothing further is needed.,

## 2016-03-13 NOTE — Telephone Encounter (Signed)
Spoke with pt's spouse who states pt had a ED visit on 01/30/16. A CXR was performed during this visit. [name] would like CY to review this CXR, as pt ended up leaving the hospital after waiting for 5.5hr, without receiving any results.   CY please advise. Thanks.

## 2016-04-30 ENCOUNTER — Ambulatory Visit: Payer: Federal, State, Local not specified - PPO | Admitting: Internal Medicine

## 2016-06-20 ENCOUNTER — Other Ambulatory Visit: Payer: Self-pay | Admitting: Internal Medicine

## 2016-06-20 NOTE — Telephone Encounter (Signed)
Dr. Annamaria Boots, Mr. Procter is asking for a refill on his clonazepam 0.5mg . Last RX was on 01/17/16 for 60 tabs with 5 refills. His last OV with you was on 06/23/15.   Is it ok for him to receive another refill? If so, he wishes to use [facility] on [address]. Thanks!

## 2016-06-20 NOTE — Telephone Encounter (Signed)
Ok to refill total 6 months 

## 2016-06-21 ENCOUNTER — Encounter: Payer: Self-pay | Admitting: Internal Medicine

## 2016-06-22 ENCOUNTER — Encounter: Payer: Self-pay | Admitting: Internal Medicine

## 2016-06-22 ENCOUNTER — Ambulatory Visit (INDEPENDENT_AMBULATORY_CARE_PROVIDER_SITE_OTHER): Payer: Federal, State, Local not specified - PPO | Admitting: Internal Medicine

## 2016-06-22 VITALS — BP 128/70 | HR 70 | Resp 16 | Ht 71.0 in | Wt 239.8 lb

## 2016-06-22 DIAGNOSIS — Z720 Tobacco use: Secondary | ICD-10-CM | POA: Diagnosis not present

## 2016-06-22 DIAGNOSIS — J449 Chronic obstructive pulmonary disease, unspecified: Secondary | ICD-10-CM

## 2016-06-22 DIAGNOSIS — J3089 Other allergic rhinitis: Secondary | ICD-10-CM | POA: Diagnosis not present

## 2016-06-22 DIAGNOSIS — G4733 Obstructive sleep apnea (adult) (pediatric): Secondary | ICD-10-CM | POA: Diagnosis not present

## 2016-06-22 DIAGNOSIS — J302 Other seasonal allergic rhinitis: Secondary | ICD-10-CM | POA: Insufficient documentation

## 2016-06-22 MED ORDER — ALBUTEROL SULFATE HFA 108 (90 BASE) MCG/ACT IN AERS: 2.0000 | INHALATION_SPRAY | Freq: Four times a day (QID) | RESPIRATORY_TRACT | 99 refills | Status: DC | PRN

## 2016-06-22 MED ORDER — CLONAZEPAM 0.5 MG PO TABS: ORAL_TABLET | ORAL | 5 refills | Status: DC

## 2016-06-22 NOTE — Assessment & Plan Note (Signed)
His wife and the download both confirm excellent compliance and control. He is very comfortable with auto 5-15/[facility]

## 2016-06-22 NOTE — Assessment & Plan Note (Signed)
Seasonal flare noted especially as he came back from [place]. He seems to be controlling adequately with Claritin and Flonase.

## 2016-06-22 NOTE — Assessment & Plan Note (Signed)
He tried sample Anoro and saw no benefit so did not fill the prescription. Uses rescue inhaler occasionally. I anticipate significant improvement off of cigarettes.

## 2016-06-22 NOTE — Patient Instructions (Addendum)
We can continue CPAP auto 5-15, mask of chice, humidifier, supplies, AirView   Dx OSA  Refills printed for clonazepam and albuterol inhaler  Please call if we can help

## 2016-06-22 NOTE — Assessment & Plan Note (Signed)
Strongly encouraged to stop smoking. His PCP will work with him on this.

## 2016-06-22 NOTE — Progress Notes (Signed)
HPI male smoker followed for OSA, COPD, tobacco use, complicated by obesity, PTSD/anxiety/depression, history syncope with RBBB/left hemiblock/ Dr Einar Gip  NPSG 11/12/1998- RDI/ AHI 75.8/hr. BiPAP 25/20 RDI 0/hr, tested in [place] PFT 06/14/11-within normal. FEV1 4.01/112%, FEV1/FVC 0.71, slight response to bronchodilator in small airways,  DLCO 87%  ------------------------------------------------------------------- 06/23/2015-61 year old male smoker followed for OSA, COPD, tobacco use, complicated by obesity, PTSD/anxiety/depression, history syncope with RBBB/left hemiblock/ Dr Einar Gip     Wife here CPAP auto 5-15 FOLLOWS FOR: Wears CPAP every night. DME [facility], wants to change to [facility] but his machine is only 59 years old and DME won't accept switch until they can change his machine.. DL attached and will need new supplies. Continues working with psychiatrist for depression and anxiety. Not ready to try smoking cessation.  06/22/16- 61 year old male smoker followed for OSA, COPD, tobacco use, complicated by obesity, PTSD/anxiety/depression, history syncope with RBBB/left hemiblock/ Dr Einar Gip     Wife here CPAP auto 5-15/[facility] FOLLOW UP FOR DME APRIA PRESSURE IS GOOD SLEEPS ABOUT  8 HOURS A NIGHT Download 100% 4 hour compliance, AHI 0.7/hour Just back from [place] vacation feeling great. Quit alcohol. He has been talking with his PCP about trying to quit smoking next-encouraged. Very comfortable with CPAP-no changes. Continues clonazepam which helps stabilize nighttime sleep. Uses rescue occasionally. Spring seasonal pollen rhinitis-Claritin and Flonase have been sufficient. CXR- 01/30/16- IMPRESSION: 1. No acute cardiopulmonary disease. 2. Findings consistent with COPD and interstitial fibrosis, without significant change from the prior study.  ROS-see HPI Constitutional:   No-   weight loss, night sweats, fevers, chills, fatigue, lassitude. HEENT:   No-  headaches, difficulty swallowing,  tooth/dental problems, sore throat,       No-  sneezing, itching, ear ache, +nasal congestion, post nasal drip,  CV:  No-   chest pain, orthopnea, PND, swelling in lower extremities, anasarca, dizziness, palpitations Resp: +   shortness of breath with exertion or at rest.              No-   productive cough,  + non-productive cough,  No- coughing up of blood.              No-   change in color of mucus.  No- wheezing.   Skin: No-   rash or lesions. GI:  No-   heartburn, indigestion, abdominal pain, nausea, vomiting,  GU:  MS:  No-   joint pain or swelling.  + back pain. Neuro-    No acute issue Psych:  No- change in mood or affect. +depression or anxiety.  No memory loss.  OBJ- Physical Exam General- Alert, Oriented, Affect-appropriate, Distress- none acute, + overweight.  Skin- rash-none, lesions- none, excoriation- none, + tanned Lymphadenopathy- none Head- atraumatic            Eyes- Gross vision intact, PERRLA, conjunctivae and secretions clear            Ears- Hearing, canals-normal            Nose- +turbinate edema, no-Septal dev, mucus, polyps, erosion, perforation             Throat- Mallampati III , mucosa clear , drainage- none, tonsils- atrophic, + missing tooth Neck- flexible , trachea midline, no stridor , thyroid nl, carotid no bruit Chest - symmetrical excursion , unlabored           Heart/CV- RRR , no murmur , no gallop  , no rub, nl s1 s2                           -  JVD- none , edema- none, stasis changes- none, varices- none           Lung- +mild coarseness to breath sounds, unlabored, wheeze- none, cough- none , dullness-none, rub- none           Chest wall-  Abd- Br/ Gen/ Rectal- Not done, not indicated Extrem- cyanosis- none, clubbing, none, atrophy- none, strength- nl Neuro- grossly intact to observation

## 2017-01-13 ENCOUNTER — Other Ambulatory Visit: Payer: Self-pay | Admitting: Internal Medicine

## 2017-01-14 NOTE — Telephone Encounter (Signed)
Please advise patient is requesting refill on klonopin. Patient was last seen on 5.4.18 with medication last refilled on 5.4.18 quantity 60 with 5 refills.

## 2017-01-15 NOTE — Telephone Encounter (Signed)
Ok refill total 6 months 

## 2017-03-28 ENCOUNTER — Encounter: Payer: Self-pay | Admitting: Internal Medicine

## 2017-03-28 ENCOUNTER — Ambulatory Visit (INDEPENDENT_AMBULATORY_CARE_PROVIDER_SITE_OTHER): Payer: Federal, State, Local not specified - PPO | Admitting: Internal Medicine

## 2017-03-28 ENCOUNTER — Other Ambulatory Visit (INDEPENDENT_AMBULATORY_CARE_PROVIDER_SITE_OTHER): Payer: Federal, State, Local not specified - PPO

## 2017-03-28 VITALS — BP 124/84 | HR 91 | Temp 98.6°F | Ht 71.0 in | Wt 249.0 lb

## 2017-03-28 DIAGNOSIS — F17210 Nicotine dependence, cigarettes, uncomplicated: Secondary | ICD-10-CM

## 2017-03-28 DIAGNOSIS — J449 Chronic obstructive pulmonary disease, unspecified: Secondary | ICD-10-CM | POA: Diagnosis not present

## 2017-03-28 DIAGNOSIS — E781 Pure hyperglyceridemia: Secondary | ICD-10-CM

## 2017-03-28 DIAGNOSIS — Z0000 Encounter for general adult medical examination without abnormal findings: Secondary | ICD-10-CM

## 2017-03-28 DIAGNOSIS — E786 Lipoprotein deficiency: Secondary | ICD-10-CM | POA: Diagnosis not present

## 2017-03-28 DIAGNOSIS — E6609 Other obesity due to excess calories: Secondary | ICD-10-CM

## 2017-03-28 DIAGNOSIS — F1010 Alcohol abuse, uncomplicated: Secondary | ICD-10-CM | POA: Diagnosis not present

## 2017-03-28 LAB — COMPREHENSIVE METABOLIC PANEL
ALBUMIN: 4.2 g/dL (ref 3.5–5.2)
ALT: 31 U/L (ref 0–53)
AST: 23 U/L (ref 0–37)
Alkaline Phosphatase: 94 U/L (ref 39–117)
BUN: 9 mg/dL (ref 6–23)
CHLORIDE: 99 meq/L (ref 96–112)
CO2: 26 meq/L (ref 19–32)
Calcium: 9.2 mg/dL (ref 8.4–10.5)
Creatinine, Ser: 0.78 mg/dL (ref 0.40–1.50)
GFR: 108.11 mL/min (ref >60.00)
GLUCOSE: 151 mg/dL — AB (ref 70–99)
POTASSIUM: 4.1 meq/L (ref 3.5–5.1)
SODIUM: 134 meq/L — AB (ref 135–145)
Total Bilirubin: 0.5 mg/dL (ref 0.2–1.2)
Total Protein: 7.5 g/dL (ref 6.0–8.3)

## 2017-03-28 LAB — CBC
HEMATOCRIT: 42.1 % (ref 39.0–52.0)
Hemoglobin: 14.9 g/dL (ref 13.0–17.0)
MCHC: 35.3 g/dL (ref 30.0–36.0)
MCV: 98.4 fl (ref 78.0–100.0)
Platelets: 177 10*3/uL (ref 150.0–400.0)
RBC: 4.28 Mil/uL (ref 4.22–5.81)
RDW: 13.8 % (ref 11.5–15.5)
WBC: 5.7 10*3/uL (ref 4.0–10.5)

## 2017-03-28 LAB — LIPID PANEL
CHOL/HDL RATIO: 8
CHOLESTEROL: 221 mg/dL — AB (ref 0–200)
HDL: 27.1 mg/dL — ABNORMAL LOW (ref >39.00)
Triglycerides: 1286 mg/dL — ABNORMAL HIGH (ref 0.0–149.0)

## 2017-03-28 LAB — LDL CHOLESTEROL, DIRECT: LDL DIRECT: 49 mg/dL

## 2017-03-28 LAB — HEMOGLOBIN A1C: HEMOGLOBIN A1C: 6.5 % (ref 4.6–6.5)

## 2017-03-28 NOTE — Assessment & Plan Note (Signed)
Drinking 6 drinks per day and prior changes on [place]. Ordered RUQ [place] to check in on changes in the liver. Given cirrhosis in the family he is reminded how he is high risk for development of cirrhosis. He is currently drinking from boredom and he is advised to find another hobby.

## 2017-03-28 NOTE — Assessment & Plan Note (Signed)
Checking lipid panel and adjust as needed.  

## 2017-03-28 NOTE — Assessment & Plan Note (Signed)
Time spent counseling about tobacco usage: 4 minutes. I have asked about smoking and is smoking more than usual. The patient is advised to quit. The patient is not willing to quit. They would like to try to quit in the next 6 months. We will follow up with them in 6 months.   

## 2017-03-28 NOTE — Assessment & Plan Note (Signed)
Likely some of the calorie increase is due to alcohol intake. He is advised to work on diet and exercise.

## 2017-03-28 NOTE — Assessment & Plan Note (Signed)
Checking labs, adjust as needed. Added to shingrix waiting list. Flu and tetanus up to date. Colonoscopy up to date. Declines HIV screening. Counseled about sun safety and mole surveillance. Given screening recommendations.

## 2017-03-28 NOTE — Assessment & Plan Note (Signed)
Continues to smoke and reminded that his SOB will only get worse with continued smoking.

## 2017-03-28 NOTE — Patient Instructions (Signed)
We are going to check the ultrasound of the liver and the labs today.   You really need to stop smoking and stop alcohol. Any more than 1 drink per day is too much for your liver. Especially with the family history of liver problems you are very high risk for liver failure or cirrhosis in the future.    Health Maintenance, Male A healthy lifestyle and preventive care is important for your health and wellness. Ask your health care provider about what schedule of regular examinations is right for you. What should I know about weight and diet? Eat a Healthy Diet  Eat plenty of vegetables, fruits, whole grains, low-fat dairy products, and lean protein.  Do not eat a lot of foods high in solid fats, added sugars, or salt.  Maintain a Healthy Weight Regular exercise can help you achieve or maintain a healthy weight. You should:  Do at least 150 minutes of exercise each week. The exercise should increase your heart rate and make you sweat (moderate-intensity exercise).  Do strength-training exercises at least twice a week.  Watch Your Levels of Cholesterol and Blood Lipids  Have your blood tested for lipids and cholesterol every 5 years starting at 60 years of age. If you are at high risk for heart disease, you should start having your blood tested when you are 60 years old. You may need to have your cholesterol levels checked more often if: ? Your lipid or cholesterol levels are high. ? You are older than 60 years of age. ? You are at high risk for heart disease.  What should I know about cancer screening? Many types of cancers can be detected early and may often be prevented. Lung Cancer  You should be screened every year for lung cancer if: ? You are a current smoker who has smoked for at least 30 years. ? You are a former smoker who has quit within the past 15 years.  Talk to your health care provider about your screening options, when you should start screening, and how often you  should be screened.  Colorectal Cancer  Routine colorectal cancer screening usually begins at 60 years of age and should be repeated every 5-10 years until you are 60 years old. You may need to be screened more often if early forms of precancerous polyps or small growths are found. Your health care provider may recommend screening at an earlier age if you have risk factors for colon cancer.  Your health care provider may recommend using home test kits to check for hidden blood in the stool.  A small camera at the end of a tube can be used to examine your colon (sigmoidoscopy or colonoscopy). This checks for the earliest forms of colorectal cancer.  Prostate and Testicular Cancer  Depending on your age and overall health, your health care provider may do certain tests to screen for prostate and testicular cancer.  Talk to your health care provider about any symptoms or concerns you have about testicular or prostate cancer.  Skin Cancer  Check your skin from head to toe regularly.  Tell your health care provider about any new moles or changes in moles, especially if: ? There is a change in a mole's size, shape, or color. ? You have a mole that is larger than a pencil eraser.  Always use sunscreen. Apply sunscreen liberally and repeat throughout the day.  Protect yourself by wearing long sleeves, pants, a wide-brimmed hat, and sunglasses when outside.  What  should I know about heart disease, diabetes, and high blood pressure?  If you are 46-2 years of age, have your blood pressure checked every 3-5 years. If you are 18 years of age or older, have your blood pressure checked every year. You should have your blood pressure measured twice-once when you are at a hospital or clinic, and once when you are not at a hospital or clinic. Record the average of the two measurements. To check your blood pressure when you are not at a hospital or clinic, you can use: ? An automated blood pressure  machine at a pharmacy. ? A home blood pressure monitor.  Talk to your health care provider about your target blood pressure.  If you are between 65-81 years old, ask your health care provider if you should take aspirin to prevent heart disease.  Have regular diabetes screenings by checking your fasting blood sugar level. ? If you are at a normal weight and have a low risk for diabetes, have this test once every three years after the age of 63. ? If you are overweight and have a high risk for diabetes, consider being tested at a younger age or more often.  A one-time screening for abdominal aortic aneurysm (AAA) by ultrasound is recommended for men aged 25-75 years who are current or former smokers. What should I know about preventing infection? Hepatitis B If you have a higher risk for hepatitis B, you should be screened for this virus. Talk with your health care provider to find out if you are at risk for hepatitis B infection. Hepatitis C Blood testing is recommended for:  Everyone born from [birth year] through 1963.  Anyone with known risk factors for hepatitis C.  Sexually Transmitted Diseases (STDs)  You should be screened each year for STDs including gonorrhea and chlamydia if: ? You are sexually active and are younger than 60 years of age. ? You are older than 60 years of age and your health care provider tells you that you are at risk for this type of infection. ? Your sexual activity has changed since you were last screened and you are at an increased risk for chlamydia or gonorrhea. Ask your health care provider if you are at risk.  Talk with your health care provider about whether you are at high risk of being infected with HIV. Your health care provider may recommend a prescription medicine to help prevent HIV infection.  What else can I do?  Schedule regular health, dental, and eye exams.  Stay current with your vaccines (immunizations).  Do not use any tobacco products,  such as cigarettes, chewing tobacco, and e-cigarettes. If you need help quitting, ask your health care provider.  Limit alcohol intake to no more than 2 drinks per day. One drink equals 12 ounces of beer, 5 ounces of wine, or 1 ounces of hard liquor.  Do not use street drugs.  Do not share needles.  Ask your health care provider for help if you need support or information about quitting drugs.  Tell your health care provider if you often feel depressed.  Tell your health care provider if you have ever been abused or do not feel safe at home. This information is not intended to replace advice given to you by your health care provider. Make sure you discuss any questions you have with your health care provider. Document Released: 08/04/2007 Document Revised: 10/05/2015 Document Reviewed: 11/09/2014 Elsevier Interactive Patient Education  [employer].

## 2017-03-28 NOTE — Progress Notes (Signed)
   Subjective:    Patient ID: [name] [name], male    DOB: Jun 16, 1955, 62 y.o.   MRN: [id number]  HPI The patient is a 62 YO man coming in for physical. Stopped alcohol for 6 months without withdrawal symptoms and now only drinking 6 beers per day due to boredom of having nothing to do all day and drinking and smoking from boredom. Father hx cirrhosis without alcohol usage.   PMH, FMH,social history reviewed and updated.   Review of Systems  Constitutional: Negative.   HENT: Negative.   Eyes: Negative.   Respiratory: Negative for cough, chest tightness and shortness of breath.   Cardiovascular: Negative for chest pain, palpitations and leg swelling.  Gastrointestinal: Negative for abdominal distention, abdominal pain, constipation, diarrhea, nausea and vomiting.  Musculoskeletal: Positive for arthralgias.  Skin: Negative.   Neurological: Negative.   Psychiatric/Behavioral: Positive for decreased concentration and dysphoric mood. Negative for self-injury, sleep disturbance and suicidal ideas. The patient is not nervous/anxious.       Objective:   Physical Exam  Constitutional: He is oriented to person, place, and time. He appears well-developed and well-nourished.  overweight  HENT:  Head: Normocephalic and atraumatic.  Eyes: EOM are normal.  Neck: Normal range of motion.  Cardiovascular: Normal rate and regular rhythm.  Pulmonary/Chest: Effort normal and breath sounds normal. No respiratory distress. He has no wheezes. He has no rales.  Abdominal: Soft. Bowel sounds are normal. He exhibits no distension. There is no tenderness. There is no rebound.  Musculoskeletal: He exhibits no edema.  Neurological: He is alert and oriented to person, place, and time. Coordination normal.  Skin: Skin is warm and dry.   Vitals:   03/28/17 0802  BP: 124/84  Pulse: 91  Temp: 98.6 F (37 C)  TempSrc: Oral  SpO2: 98%  Weight: 249 lb (112.9 kg)  Height: 5\' 11"  (1.803 m)      Assessment &  Plan:

## 2017-04-01 ENCOUNTER — Other Ambulatory Visit: Payer: Self-pay | Admitting: Internal Medicine

## 2017-04-01 MED ORDER — ROSUVASTATIN CALCIUM 20 MG PO TABS: 20.0000 mg | ORAL_TABLET | Freq: Every day | ORAL | 3 refills | Status: DC

## 2017-04-02 ENCOUNTER — Telehealth: Payer: Self-pay | Admitting: Internal Medicine

## 2017-04-02 NOTE — Telephone Encounter (Signed)
Instructed to contact [facility] and they will get refills from pt.'s [facility].

## 2017-04-02 NOTE — Telephone Encounter (Signed)
Copied from [place]. Topic: Quick Communication - Rx Refill/Question >> Apr 02, 2017  1:33 PM Arletha Grippe wrote: Medication: rosuvastatin (CRESTOR) 20 MG tablet   Has the patient contacted their pharmacy? No.   (Agent: If no, request that the patient contact the pharmacy for the refill.)   Preferred Pharmacy (with phone number or street name): was sent to wrong pharmacy - should [facility] to [facility] on [address]     Agent: Please be advised that RX refills may take up to 3 business days. We ask that you follow-up with your pharmacy.

## 2017-04-11 ENCOUNTER — Ambulatory Visit
Admission: RE | Admit: 2017-04-11 | Discharge: 2017-04-11 | Disposition: A | Discharge status: Home / Self Care | Payer: Federal, State, Local not specified - PPO | Source: Ambulatory Visit | Attending: Internal Medicine | Admitting: Internal Medicine

## 2017-04-11 DIAGNOSIS — F1010 Alcohol abuse, uncomplicated: Secondary | ICD-10-CM

## 2017-06-17 ENCOUNTER — Ambulatory Visit (INDEPENDENT_AMBULATORY_CARE_PROVIDER_SITE_OTHER): Payer: Federal, State, Local not specified - PPO

## 2017-06-17 DIAGNOSIS — Z23 Encounter for immunization: Secondary | ICD-10-CM

## 2017-06-17 DIAGNOSIS — Z299 Encounter for prophylactic measures, unspecified: Secondary | ICD-10-CM

## 2017-06-23 ENCOUNTER — Encounter: Payer: Self-pay | Admitting: Internal Medicine

## 2017-06-24 ENCOUNTER — Ambulatory Visit (INDEPENDENT_AMBULATORY_CARE_PROVIDER_SITE_OTHER)
Admission: RE | Admit: 2017-06-24 | Discharge: 2017-06-24 | Disposition: A | Discharge status: Home / Self Care | Payer: Federal, State, Local not specified - PPO | Source: Ambulatory Visit | Attending: Internal Medicine | Admitting: Internal Medicine

## 2017-06-24 ENCOUNTER — Encounter: Payer: Self-pay | Admitting: Internal Medicine

## 2017-06-24 ENCOUNTER — Ambulatory Visit: Payer: Federal, State, Local not specified - PPO | Admitting: Internal Medicine

## 2017-06-24 VITALS — BP 160/94 | HR 86 | Ht 71.0 in | Wt 231.0 lb

## 2017-06-24 DIAGNOSIS — G4733 Obstructive sleep apnea (adult) (pediatric): Secondary | ICD-10-CM

## 2017-06-24 DIAGNOSIS — J449 Chronic obstructive pulmonary disease, unspecified: Secondary | ICD-10-CM | POA: Diagnosis not present

## 2017-06-24 MED ORDER — UMECLIDINIUM-VILANTEROL 62.5-25 MCG/INH IN AEPB: 1.0000 | INHALATION_SPRAY | Freq: Every day | RESPIRATORY_TRACT | 0 refills | Status: DC

## 2017-06-24 MED ORDER — UMECLIDINIUM-VILANTEROL 62.5-25 MCG/INH IN AEPB: 1.0000 | INHALATION_SPRAY | Freq: Every day | RESPIRATORY_TRACT | 12 refills | Status: DC

## 2017-06-24 MED ORDER — CLONAZEPAM 0.5 MG PO TABS: ORAL_TABLET | ORAL | 5 refills | Status: DC

## 2017-06-24 NOTE — Assessment & Plan Note (Signed)
I am encouraged that he seems determined to stop smoking this month.  He has had increased cough and dyspnea on exertion. Plan-CXR, sample of Anoro

## 2017-06-24 NOTE — Assessment & Plan Note (Signed)
He continues to benefit from CPAP, sleeping much better.  Excellent compliance and control. Plan-continue auto 5-15, refill supplies

## 2017-06-24 NOTE — Progress Notes (Signed)
HPI male smoker followed for OSA, COPD, tobacco use, complicated by obesity, PTSD/anxiety/depression, history syncope with RBBB/left hemiblock/ Dr Einar Gip  NPSG 11/12/1998- RDI/ AHI 75.8/hr. BiPAP 25/20 RDI 0/hr, tested in [place] PFT 06/14/11-within normal. FEV1 4.01/112%, FEV1/FVC 0.71, slight response to bronchodilator in small airways,  DLCO 87%  -------------------------------------------------------------------.  06/22/16- 62 year old male smoker followed for OSA, COPD, tobacco use, complicated by obesity, PTSD/anxiety/depression, history syncope with RBBB/left hemiblock/ Dr Einar Gip     Wife here CPAP auto 5-15/[facility] FOLLOW UP FOR DME APRIA PRESSURE IS GOOD SLEEPS ABOUT  8 HOURS A NIGHT Download 100% 4 hour compliance, AHI 0.7/hour Just back from [place] vacation feeling great. Quit alcohol. He has been talking with his PCP about trying to quit smoking next-encouraged. Very comfortable with CPAP-no changes. Continues clonazepam which helps stabilize nighttime sleep. Uses rescue occasionally. Spring seasonal pollen rhinitis-Claritin and Flonase have been sufficient. CXR- 01/30/16- IMPRESSION: 1. No acute cardiopulmonary disease. 2. Findings consistent with COPD and interstitial fibrosis, without significant change from the prior study.  06/24/2017- 62 year old male smoker followed for OSA, COPD, tobacco use, complicated by obesity, PTSD/anxiety/depression, history syncope with RBBB/left hemiblock/ Dr Einar Gip     Wife here CPAP auto 5-15/[facility] Clonazepam for sleep ----OSA; DME [facility]. Pt wears CPAP nightly and DL attached. Pressure works well and will need order for new supplies. Takes BP medicine "when needed". Plans to stop smoking this month- has a quit date. Wife points out he has been coughing more for 6 months-clear sputum.  Dyspnea on exertion-has to stop while mowing lawn.  Not using inhaler at all. Very happy with CPAP-sleeps with it every night.  Download 100% compliance AHI  0.8/hour.  They like [employer] machine.  Office Spirometry 06/24/17-WNL-FVC 4.74/95%, FEV1 3.21/85%, ratio 0.68, FEF 25-75% 2.09/67%  Patient seen in the office today and instructed on use of Anoro.  Patient expressed understanding and demonstrated technique. Katie Welchel,CMA   ROS-see HPI + = positive Constitutional:   No-   weight loss, night sweats, fevers, chills, fatigue, lassitude. HEENT:   No-  headaches, difficulty swallowing, tooth/dental problems, sore throat,       No-  sneezing, itching, ear ache, +nasal congestion, post nasal drip,  CV:  No-   chest pain, orthopnea, PND, swelling in lower extremities, anasarca, dizziness, palpitations Resp: +   shortness of breath with exertion or at rest.              +   productive cough,  + non-productive cough,  No- coughing up of blood.              No-   change in color of mucus.  No- wheezing.   Skin: No-   rash or lesions. GI:  No-   heartburn, indigestion, abdominal pain, nausea, vomiting,  GU:  MS:  No-   joint pain or swelling.  + back pain. Neuro-    No acute issue Psych:  No- change in mood or affect. +depression or anxiety.  No memory loss.  OBJ- Physical Exam General- Alert, Oriented, Affect-appropriate, Distress- none acute, + overweight.  Skin- rash-none, lesions- none, excoriation- none, + tanned Lymphadenopathy- none Head- atraumatic            Eyes- Gross vision intact, PERRLA, conjunctivae and secretions clear            Ears- Hearing, canals-normal            Nose- +turbinate edema, no-Septal dev, mucus, polyps, erosion, perforation  Throat- Mallampati III , mucosa clear , drainage- none, tonsils- atrophic, + missing tooth Neck- flexible , trachea midline, no stridor , thyroid nl, carotid no bruit Chest - symmetrical excursion , unlabored           Heart/CV- RRR , no murmur , no gallop  , no rub, nl s1 s2                           - JVD- none , edema- none, stasis changes- none, varices- none            Lung- +few rhonchi R back, unlabored, wheeze- none, cough- none , dullness-none, rub- none           Chest wall-  Abd- Br/ Gen/ Rectal- Not done, not indicated Extrem- cyanosis- none, clubbing, none, atrophy- none, strength- nl Neuro- grossly intact to observation

## 2017-06-24 NOTE — Patient Instructions (Addendum)
Order- office spirometry      Dx COPD mixed type  Order- CXR       Order- DME [facility]- please replace CPAP mask and supplies, continue auto 5-15, mask of choice, humidifier, supplies,, AirView   Sample and printed script for Anoro inhaler     Inhale 1 puff, once daily. If it seems to help your breathing you can get the script filled.  Check with yor PCP about your blood pressure control- 160/ 94 today is too high. You don't want a stroke !  Great news about your smoking plans. That will really help you !!!!

## 2017-06-28 ENCOUNTER — Other Ambulatory Visit (INDEPENDENT_AMBULATORY_CARE_PROVIDER_SITE_OTHER): Payer: Federal, State, Local not specified - PPO

## 2017-06-28 ENCOUNTER — Encounter: Payer: Self-pay | Admitting: Internal Medicine

## 2017-06-28 ENCOUNTER — Ambulatory Visit: Payer: Federal, State, Local not specified - PPO | Admitting: Internal Medicine

## 2017-06-28 VITALS — BP 136/84 | HR 72 | Temp 98.7°F | Ht 71.0 in | Wt 231.0 lb

## 2017-06-28 DIAGNOSIS — E781 Pure hyperglyceridemia: Secondary | ICD-10-CM

## 2017-06-28 DIAGNOSIS — E118 Type 2 diabetes mellitus with unspecified complications: Secondary | ICD-10-CM | POA: Insufficient documentation

## 2017-06-28 DIAGNOSIS — F1010 Alcohol abuse, uncomplicated: Secondary | ICD-10-CM | POA: Diagnosis not present

## 2017-06-28 DIAGNOSIS — E119 Type 2 diabetes mellitus without complications: Secondary | ICD-10-CM | POA: Diagnosis not present

## 2017-06-28 DIAGNOSIS — E1165 Type 2 diabetes mellitus with hyperglycemia: Secondary | ICD-10-CM | POA: Insufficient documentation

## 2017-06-28 DIAGNOSIS — E786 Lipoprotein deficiency: Secondary | ICD-10-CM | POA: Diagnosis not present

## 2017-06-28 LAB — COMPREHENSIVE METABOLIC PANEL
ALK PHOS: 109 U/L (ref 39–117)
ALT: 88 U/L — AB (ref 0–53)
AST: 45 U/L — AB (ref 0–37)
Albumin: 4.1 g/dL (ref 3.5–5.2)
BILIRUBIN TOTAL: 0.4 mg/dL (ref 0.2–1.2)
BUN: 12 mg/dL (ref 6–23)
CO2: 29 meq/L (ref 19–32)
CREATININE: 0.68 mg/dL (ref 0.40–1.50)
Calcium: 9.4 mg/dL (ref 8.4–10.5)
Chloride: 105 mEq/L (ref 96–112)
GFR: 126.55 mL/min (ref >60.00)
GLUCOSE: 137 mg/dL — AB (ref 70–99)
Potassium: 3.8 mEq/L (ref 3.5–5.1)
Sodium: 141 mEq/L (ref 135–145)
TOTAL PROTEIN: 7 g/dL (ref 6.0–8.3)

## 2017-06-28 LAB — LIPID PANEL
Cholesterol: 96 mg/dL (ref 0–200)
HDL: 34 mg/dL — ABNORMAL LOW (ref >39.00)
Total CHOL/HDL Ratio: 3
Triglycerides: 481 mg/dL — ABNORMAL HIGH (ref 0.0–149.0)

## 2017-06-28 LAB — LDL CHOLESTEROL, DIRECT: Direct LDL: 26 mg/dL

## 2017-06-28 LAB — HEMOGLOBIN A1C: HEMOGLOBIN A1C: 5.9 % (ref 4.6–6.5)

## 2017-06-28 NOTE — Patient Instructions (Signed)
We will check the labs today.   Keep up the good work with no alcohol and only vaping or work on quitting vaping.  Your liver has suffered some damage so you should stay away from alcohol for life.

## 2017-06-28 NOTE — Assessment & Plan Note (Signed)
Checking HgA1c, likely due to alcohol intake and diet. He has quit alcohol so checking levels today. Reminded about eye exam.

## 2017-06-28 NOTE — Assessment & Plan Note (Signed)
Started crestor and checking lipid panel today.

## 2017-06-28 NOTE — Progress Notes (Signed)
   Subjective:    Patient ID: [name] [name], male    DOB: Oct 28, 1955, 62 y.o.   MRN: [id number]  HPI The patient is a 62 YO man coming in for follow up of new diabetes (last HgA1c in diabetes range, has quit alcohol and low carb diet to help since last visit, denies numbness or weakness), alcohol abuse (has stopped drinking beer and on low carb diet since last visit, US of the stomach with changes from alcohol and fatty liver, father with hx cirrhosis without alcohol use, denies swelling in legs or stomach) and cholesterol problems (stopped alcohol to help with triglycerides and overall health, started crestor without problems, denies chest pains or chest tightness, no new numbness or muscle pains). Has not found any activities to fill his time. Is vaping now instead of smoking. Wants to quit that as well. Breathing is some better without the smoking.   Review of Systems  Constitutional: Negative.   HENT: Negative.   Eyes: Negative.   Respiratory: Negative for cough, chest tightness and shortness of breath.   Cardiovascular: Negative for chest pain, palpitations and leg swelling.  Gastrointestinal: Negative for abdominal distention, abdominal pain, constipation, diarrhea, nausea and vomiting.  Musculoskeletal: Negative.   Skin: Negative.   Neurological: Negative.   Psychiatric/Behavioral: Negative.       Objective:   Physical Exam  Constitutional: He is oriented to person, place, and time. He appears well-developed and well-nourished.  HENT:  Head: Normocephalic and atraumatic.  Eyes: EOM are normal.  Neck: Normal range of motion.  Cardiovascular: Normal rate and regular rhythm.  Pulmonary/Chest: Effort normal and breath sounds normal. No respiratory distress. He has no wheezes. He has no rales.  Abdominal: Soft. Bowel sounds are normal. He exhibits no distension. There is no tenderness. There is no rebound.  Musculoskeletal: He exhibits no edema.  Neurological: He is alert and oriented  to person, place, and time. Coordination normal.  Skin: Skin is warm and dry.  Psychiatric: He has a normal mood and affect.   Vitals:   06/28/17 0829  BP: 136/84  Pulse: 72  Temp: 98.7 F (37.1 C)  TempSrc: Oral  SpO2: 96%  Weight: 231 lb (104.8 kg)  Height: 5\' 11"  (1.803 m)      Assessment & Plan:

## 2017-07-05 ENCOUNTER — Telehealth: Payer: Self-pay | Admitting: Internal Medicine

## 2017-07-05 NOTE — Telephone Encounter (Signed)
Erroneous telephone encounter. Routing CRM to [employer].

## 2017-07-05 NOTE — Telephone Encounter (Signed)
Copied from [place] [id number]. Topic: Quick Communication - See Telephone Encounter >> Jul 05, 2017  9:45 AM Boyd Kerbs wrote: CRM for notification. See Telephone encounter for: 07/05/17.  Raquel Sarna from [place] claim ref# [id number]  wrong code was submitted Please re-submit with correct code

## 2017-07-16 ENCOUNTER — Other Ambulatory Visit: Payer: Self-pay | Admitting: Internal Medicine

## 2017-07-17 ENCOUNTER — Other Ambulatory Visit: Payer: Self-pay | Admitting: Internal Medicine

## 2017-07-17 NOTE — Telephone Encounter (Signed)
06/24/17 pt was given a 30 day rx with 5 refills, should not need refill.  Called pharmacy to clarify.  Pharmacy will contact pt regarding this.

## 2017-07-18 NOTE — Telephone Encounter (Signed)
Ok to refill total 6 months 

## 2017-07-18 NOTE — Telephone Encounter (Signed)
CY Please advise on refill. Thanks. Pending OV: 12/2017

## 2017-07-19 NOTE — Telephone Encounter (Signed)
Called and spoke with pharmacy. Patient dropped off prescription printed on 5.6.19 with 5 additional refills and picked one up.    Refused current prescription refill as it is not appropriate at this time. Nothing further needed.

## 2017-08-26 ENCOUNTER — Ambulatory Visit (INDEPENDENT_AMBULATORY_CARE_PROVIDER_SITE_OTHER): Payer: Federal, State, Local not specified - PPO

## 2017-08-26 DIAGNOSIS — Z299 Encounter for prophylactic measures, unspecified: Secondary | ICD-10-CM

## 2017-08-26 DIAGNOSIS — Z23 Encounter for immunization: Secondary | ICD-10-CM

## 2017-10-08 ENCOUNTER — Telehealth: Payer: Self-pay | Admitting: Internal Medicine

## 2017-10-08 NOTE — Telephone Encounter (Signed)
Labs faxed to number below

## 2017-10-08 NOTE — Telephone Encounter (Signed)
Wife stated that patient goes to [facility] NP ladoie had ordered these labs because patient gets injections and they like to get labs done to makes sure patients levels is okay. Patients is wondering if they can just get the labs done at our office and if you would be willing to put the labs in that way they can stay in our system

## 2017-10-08 NOTE — Telephone Encounter (Signed)
They would probably have to order them as I don't have a reason to order now and they would not be covered.

## 2017-10-08 NOTE — Telephone Encounter (Signed)
Wife called to give a number to where the pt's last lab results can be faxed to  Fax: [phone]

## 2017-10-08 NOTE — Telephone Encounter (Signed)
Copied from [place] [phone]. Topic: Inquiry >> Oct 08, 2017 10:16 AM Oliver Pila B wrote: Reason for CRM: pt called to get a lab order created for CBC, LIPID, CMP; contact pt to advise

## 2017-10-08 NOTE — Telephone Encounter (Signed)
Was patient to be coming back for re-peat labs for anything?

## 2017-10-08 NOTE — Telephone Encounter (Signed)
I don't see a need for repeat labs this soon. Has something changed?

## 2017-10-08 NOTE — Telephone Encounter (Signed)
Patients wife informed of MD response and stated understanding will call the other office and see where labs can be drawn at

## 2017-11-27 ENCOUNTER — Encounter: Payer: Self-pay | Admitting: Internal Medicine

## 2017-11-27 ENCOUNTER — Ambulatory Visit: Payer: Federal, State, Local not specified - PPO | Admitting: Internal Medicine

## 2017-11-27 ENCOUNTER — Other Ambulatory Visit (INDEPENDENT_AMBULATORY_CARE_PROVIDER_SITE_OTHER): Payer: Federal, State, Local not specified - PPO

## 2017-11-27 VITALS — BP 108/70 | HR 76 | Temp 98.9°F | Ht 71.0 in | Wt 232.0 lb

## 2017-11-27 DIAGNOSIS — F1010 Alcohol abuse, uncomplicated: Secondary | ICD-10-CM

## 2017-11-27 DIAGNOSIS — E119 Type 2 diabetes mellitus without complications: Secondary | ICD-10-CM

## 2017-11-27 DIAGNOSIS — Z1159 Encounter for screening for other viral diseases: Secondary | ICD-10-CM

## 2017-11-27 LAB — COMPREHENSIVE METABOLIC PANEL
ALK PHOS: 75 U/L (ref 39–117)
ALT: 43 U/L (ref 0–53)
AST: 28 U/L (ref 0–37)
Albumin: 4.9 g/dL (ref 3.5–5.2)
BUN: 11 mg/dL (ref 6–23)
CO2: 32 meq/L (ref 19–32)
Calcium: 10.2 mg/dL (ref 8.4–10.5)
Chloride: 102 mEq/L (ref 96–112)
Creatinine, Ser: 0.89 mg/dL (ref 0.40–1.50)
GFR: 92.63 mL/min (ref >60.00)
GLUCOSE: 123 mg/dL — AB (ref 70–99)
POTASSIUM: 4.4 meq/L (ref 3.5–5.1)
SODIUM: 141 meq/L (ref 135–145)
TOTAL PROTEIN: 7.9 g/dL (ref 6.0–8.3)
Total Bilirubin: 0.4 mg/dL (ref 0.2–1.2)

## 2017-11-27 LAB — MICROALBUMIN / CREATININE URINE RATIO
Creatinine,U: 73.6 mg/dL
MICROALB/CREAT RATIO: 1 mg/g (ref 0.0–30.0)
Microalb, Ur: <0.7 mg/dL (ref 0.0–1.9)

## 2017-11-27 LAB — HEMOGLOBIN A1C: Hgb A1c MFr Bld: 6.7 % — ABNORMAL HIGH (ref 4.6–6.5)

## 2017-11-27 NOTE — Patient Instructions (Signed)
We are checking the labs today and will get the ultrasound of the neck.   Great job on staying away from alcohol. You should stay away from alcohol for life.

## 2017-11-27 NOTE — Progress Notes (Signed)
   Subjective:    Patient ID: [name] [name], male    DOB: [birth year], 62 y.o.   MRN: [id number]  HPI The patient is a 62 YO man man coming in for follow up of his diabetes. He admits to still not drinking alcohol. He is still vaping instead of smoking. Denies activity changes and has not found any new hobbies to occupy his time. Denies abdominal pain, diarrhea, constipation. Denies chest pains, SOB.   Review of Systems  Constitutional: Negative.   HENT: Negative.   Eyes: Negative.   Respiratory: Negative for cough, chest tightness and shortness of breath.   Cardiovascular: Negative for chest pain, palpitations and leg swelling.  Gastrointestinal: Negative for abdominal distention, abdominal pain, constipation, diarrhea, nausea and vomiting.  Musculoskeletal: Negative.   Skin: Negative.   Neurological: Negative.   Psychiatric/Behavioral: Negative.       Objective:   Physical Exam  Constitutional: He is oriented to person, place, and time. He appears well-developed and well-nourished.  overweight  HENT:  Head: Normocephalic and atraumatic.  Eyes: EOM are normal.  Neck: Normal range of motion.  Cardiovascular: Normal rate and regular rhythm.  Pulmonary/Chest: Effort normal and breath sounds normal. No respiratory distress. He has no wheezes. He has no rales.  Abdominal: Soft. Bowel sounds are normal. He exhibits no distension. There is no tenderness. There is no rebound.  Musculoskeletal: He exhibits no edema.  Neurological: He is alert and oriented to person, place, and time. Coordination normal.  Skin: Skin is warm and dry.  Psychiatric: He has a normal mood and affect.   Vitals:   11/27/17 1018  BP: 108/70  Pulse: 76  Temp: 98.9 F (37.2 C)  TempSrc: Oral  SpO2: 96%  Weight: 232 lb (105.2 kg)  Height: 5\' 11"  (1.803 m)      Assessment & Plan:

## 2017-11-28 LAB — HEPATITIS B SURFACE ANTIBODY,QUALITATIVE: Hep B S Ab: NONREACTIVE

## 2017-11-28 LAB — HEPATITIS C ANTIBODY
HEP C AB: NONREACTIVE
SIGNAL TO CUT-OFF: 0.01 (ref <1.00)

## 2017-11-29 ENCOUNTER — Telehealth: Payer: Self-pay

## 2017-11-29 NOTE — Telephone Encounter (Signed)
noted 

## 2017-11-29 NOTE — Telephone Encounter (Signed)
Patient is wondering how long it takes to get in for the VAS US CAROTID to be able to get scheduled. Patient is going out of town on the 23rd they were hoping they could get in before that. Thank you

## 2017-11-29 NOTE — Assessment & Plan Note (Signed)
Reminded to never drink again with liver disease and family history of cirrhosis.

## 2017-11-29 NOTE — Telephone Encounter (Signed)
I called and scheduled him for Wednesday 10/16 @ 11:00. Left vm for pt to call back

## 2017-11-29 NOTE — Assessment & Plan Note (Signed)
Not taking meds. Likely related to mental health medication side effects. Checking HgA1c today, foot exam done today. Reminded about need for eye exam yearly. Checking microalbumin to creatinine ratio.  On statin.

## 2017-12-02 ENCOUNTER — Other Ambulatory Visit: Payer: Self-pay | Admitting: Internal Medicine

## 2017-12-02 DIAGNOSIS — I6523 Occlusion and stenosis of bilateral carotid arteries: Secondary | ICD-10-CM

## 2017-12-04 ENCOUNTER — Ambulatory Visit (HOSPITAL_COMMUNITY)
Admission: RE | Admit: 2017-12-04 | Discharge: 2017-12-04 | Disposition: A | Discharge status: Home / Self Care | Payer: Federal, State, Local not specified - PPO | Source: Ambulatory Visit | Attending: Internal Medicine | Admitting: Internal Medicine

## 2017-12-04 DIAGNOSIS — I6523 Occlusion and stenosis of bilateral carotid arteries: Secondary | ICD-10-CM | POA: Insufficient documentation

## 2017-12-25 ENCOUNTER — Encounter: Payer: Self-pay | Admitting: Internal Medicine

## 2017-12-25 ENCOUNTER — Ambulatory Visit: Payer: Federal, State, Local not specified - PPO | Admitting: Internal Medicine

## 2017-12-25 DIAGNOSIS — J449 Chronic obstructive pulmonary disease, unspecified: Secondary | ICD-10-CM | POA: Diagnosis not present

## 2017-12-25 DIAGNOSIS — G4733 Obstructive sleep apnea (adult) (pediatric): Secondary | ICD-10-CM | POA: Diagnosis not present

## 2017-12-25 MED ORDER — ALBUTEROL SULFATE HFA 108 (90 BASE) MCG/ACT IN AERS: 2.0000 | INHALATION_SPRAY | Freq: Four times a day (QID) | RESPIRATORY_TRACT | 99 refills | Status: DC | PRN

## 2017-12-25 MED ORDER — CLONAZEPAM 0.5 MG PO TABS: ORAL_TABLET | ORAL | 5 refills | Status: DC

## 2017-12-25 NOTE — Assessment & Plan Note (Addendum)
He continues to benefit from CPAP with improved sleep.  Download confirms excellent compliance and control. Plan-continue CPAP auto 5-15, refill clonazepam

## 2017-12-25 NOTE — Patient Instructions (Addendum)
Script refilling clonazepam    We will keep script for rescue inhaler on your chart  Please call if we can help

## 2017-12-25 NOTE — Assessment & Plan Note (Signed)
Mild COPD and deconditioning.  We discussed done pulmonary causes for dyspnea on exertion.  He denies chest pain, ankle edema, palpitation, but I reviewed, and symptoms of cardiac disease for him to watch out for. Plan-stop all smoking.

## 2017-12-25 NOTE — Progress Notes (Signed)
HPI male smoker followed for OSA, COPD, tobacco use, complicated by obesity, PTSD/anxiety/depression, history syncope with RBBB/left hemiblock/ Dr Einar Gip  NPSG 11/12/1998- RDI/ AHI 75.8/hr. BiPAP 25/20 RDI 0/hr, tested in [place] PFT 06/14/11-within normal. FEV1 4.01/112%, FEV1/FVC 0.71, slight response to bronchodilator in small airways,  DLCO 87% Office Spirometry 06/24/17-WNL-FVC 4.74/95%, FEV1 3.21/85%, ratio 0.68, FEF 25-75% 2.09/67% -------------------------------------------------------------------.  06/24/2017- 62 year old male smoker followed for OSA, COPD, tobacco use, complicated by obesity, PTSD/anxiety/depression, history syncope with RBBB/left hemiblock/ Dr Einar Gip     Wife here CPAP auto 5-15/[facility] Clonazepam for sleep ----OSA; DME [facility]. Pt wears CPAP nightly and DL attached. Pressure works well and will need order for new supplies. Takes BP medicine "when needed". Plans to stop smoking this month- has a quit date. Wife points out he has been coughing more for 6 months-clear sputum.  Dyspnea on exertion-has to stop while mowing lawn.  Not using inhaler at all. Very happy with CPAP-sleeps with it every night.  Download 100% compliance AHI 0.8/hour.  They like So Clean machine.  Office Spirometry 06/24/17-WNL-FVC 4.74/95%, FEV1 3.21/85%, ratio 0.68, FEF 25-75% 2.09/67% Patient seen in the office today and instructed on use of Anoro.  Patient expressed understanding and demonstrated technique. Blair Hailey   12/25/2017- 62 year old male smoker followed for OSA, COPD, tobacco use, complicated by obesity, PTSD/anxiety/depression, history syncope with RBBB/left hemiblock/ Dr Einar Gip     Wife here CPAP auto 5-15/[facility] Download 100% compliance AHI 1.5/hour Clonazepam for sleep -----OSA: DME [facility]. Pt wears CPAP nightly and DL attached. No new supplies needed at this time.  Here with his wife.  He has quit smoking cigarettes, changing to low nicotine Vapes with intention to gradually  taper off.  Chronic cough has substantially improved with this change.  He notices dyspnea on exertion associated with unusual exertion such as long walks uphill.  Denies chest pain or palpitation.  No discolored sputum.  Not using Anoro after finding sample made no difference.  He does not feel he needs inhalers now.  Wants to keep rescue inhaler available. He is comfortable with his CPAP, confirmed by his wife and by excellent compliance data.  He reports he can no longer sleep comfortably without CPAP. We reviewed CXR report and images with attention to "fibrosis". CXR 06/24/2017- 1.  No acute cardiopulmonary disease. 2. COPD and interstitial pulmonary fibrosis, unchanged since 2017 though progressive since 2015.  ROS-see HPI + = positive Constitutional:   No-   weight loss, night sweats, fevers, chills, fatigue, lassitude. HEENT:   No-  headaches, difficulty swallowing, tooth/dental problems, sore throat,       No-  sneezing, itching, ear ache, +nasal congestion, post nasal drip,  CV:  No-   chest pain, orthopnea, PND, swelling in lower extremities, anasarca, dizziness, palpitations Resp: +   shortness of breath with exertion or at rest.              +   productive cough,  + non-productive cough,  No- coughing up of blood.              No-   change in color of mucus.  No- wheezing.   Skin: No-   rash or lesions. GI:  No-   heartburn, indigestion, abdominal pain, nausea, vomiting,  GU:  MS:  No-   joint pain or swelling.  + back pain. Neuro-    No acute issue Psych:  No- change in mood or affect. +depression or anxiety.  No memory loss.  OBJ- Physical Exam General- Alert, Oriented, Affect-appropriate, Distress- none acute, + overweight.  Skin- rash-none, lesions- none, excoriation- none,  Lymphadenopathy- none Head- atraumatic            Eyes- Gross vision intact, PERRLA, conjunctivae and secretions clear            Ears- Hearing, canals-normal            Nose- +turbinate edema,  no-Septal dev, mucus, polyps, erosion, perforation             Throat- Mallampati III , mucosa clear , drainage- none, tonsils- atrophic, + missing tooth Neck- flexible , trachea midline, no stridor , thyroid nl, carotid no bruit Chest - symmetrical excursion , unlabored           Heart/CV- RRR , no murmur , no gallop  , no rub, nl s1 s2                           - JVD- none , edema- none, stasis changes- none, varices- none           Lung- + clear, unlabored, wheeze- none, cough- none , dullness-none, rub- none, crackles-none           Chest wall-  Abd- Br/ Gen/ Rectal- Not done, not indicated Extrem- cyanosis- none, clubbing, none, atrophy- none, strength- nl Neuro- grossly intact to observation

## 2018-01-09 ENCOUNTER — Other Ambulatory Visit: Payer: Self-pay | Admitting: Internal Medicine

## 2018-03-20 ENCOUNTER — Other Ambulatory Visit: Payer: Self-pay | Admitting: Internal Medicine

## 2018-04-22 ENCOUNTER — Other Ambulatory Visit: Payer: Self-pay | Admitting: Internal Medicine

## 2018-04-22 ENCOUNTER — Ambulatory Visit: Payer: Federal, State, Local not specified - PPO | Admitting: Internal Medicine

## 2018-04-22 ENCOUNTER — Other Ambulatory Visit (INDEPENDENT_AMBULATORY_CARE_PROVIDER_SITE_OTHER): Payer: Federal, State, Local not specified - PPO

## 2018-04-22 ENCOUNTER — Encounter: Payer: Self-pay | Admitting: Internal Medicine

## 2018-04-22 VITALS — BP 120/80 | HR 81 | Temp 98.2°F | Ht 71.0 in | Wt 244.0 lb

## 2018-04-22 DIAGNOSIS — E119 Type 2 diabetes mellitus without complications: Secondary | ICD-10-CM

## 2018-04-22 DIAGNOSIS — R824 Acetonuria: Secondary | ICD-10-CM | POA: Insufficient documentation

## 2018-04-22 DIAGNOSIS — R358 Other polyuria: Secondary | ICD-10-CM | POA: Diagnosis not present

## 2018-04-22 DIAGNOSIS — R3589 Other polyuria: Secondary | ICD-10-CM

## 2018-04-22 LAB — HEMOGLOBIN A1C: Hgb A1c MFr Bld: 10.6 % — ABNORMAL HIGH (ref 4.6–6.5)

## 2018-04-22 LAB — POCT URINALYSIS DIPSTICK
BILIRUBIN UA: NEGATIVE
Blood, UA: NEGATIVE
Glucose, UA: POSITIVE — AB
Leukocytes, UA: NEGATIVE
NITRITE UA: NEGATIVE
Protein, UA: NEGATIVE
Spec Grav, UA: 1.02 (ref 1.010–1.025)
Urobilinogen, UA: 0.2 E.U./dL
pH, UA: 5.5 (ref 5.0–8.0)

## 2018-04-22 LAB — COMPREHENSIVE METABOLIC PANEL
ALBUMIN: 4 g/dL (ref 3.5–5.2)
ALT: 28 U/L (ref 0–53)
AST: 19 U/L (ref 0–37)
Alkaline Phosphatase: 136 U/L — ABNORMAL HIGH (ref 39–117)
BILIRUBIN TOTAL: 0.4 mg/dL (ref 0.2–1.2)
BUN: 12 mg/dL (ref 6–23)
CALCIUM: 9.1 mg/dL (ref 8.4–10.5)
CO2: 26 meq/L (ref 19–32)
CREATININE: 0.63 mg/dL (ref 0.40–1.50)
Chloride: 99 mEq/L (ref 96–112)
GFR: 129.68 mL/min (ref >60.00)
Glucose, Bld: 306 mg/dL — ABNORMAL HIGH (ref 70–99)
Potassium: 3.8 mEq/L (ref 3.5–5.1)
SODIUM: 136 meq/L (ref 135–145)
Total Protein: 6.7 g/dL (ref 6.0–8.3)

## 2018-04-22 MED ORDER — METFORMIN HCL 500 MG PO TABS: 500.0000 mg | ORAL_TABLET | Freq: Two times a day (BID) | ORAL | 3 refills | Status: DC

## 2018-04-22 NOTE — Progress Notes (Signed)
   Subjective:   Patient ID: [name] [name], male    DOB: 16-Dec-1955, 63 y.o.   MRN: [id number]  HPI The patient is a 63 y.o. man coming in for urinary symptoms. Started about 1 week ago. Main symptoms are: foul smell and urgency to urinate. Denies pain or burning in the low abdomen or pain while urinating. Overall it is stable. Has tried nothing. He does have concurrent diabetes and is drinking a lot more diet sodas recently. Denies change in diet. Prior alcohol abuse but him and wife deny current alcohol usage. Denies abdominal pain, nausea or vomiting. Denies activity changes. Diabetes is diet controlled and has been poorly controlled previously when drinking excessive alcohol.   Review of Systems  Constitutional: Negative.   HENT: Negative.   Eyes: Negative.   Respiratory: Negative for cough, chest tightness and shortness of breath.   Cardiovascular: Negative for chest pain, palpitations and leg swelling.  Gastrointestinal: Negative for abdominal distention, abdominal pain, constipation, diarrhea, nausea and vomiting.  Endocrine: Positive for polydipsia and polyuria.  Genitourinary: Positive for enuresis, frequency and urgency. Negative for difficulty urinating, discharge, dysuria, flank pain and genital sores.  Musculoskeletal: Negative.   Skin: Negative.   Neurological: Negative.   Psychiatric/Behavioral: Negative.     Objective:  Physical Exam Constitutional:      Appearance: He is well-developed. He is obese.  HENT:     Head: Normocephalic and atraumatic.  Neck:     Musculoskeletal: Normal range of motion.  Cardiovascular:     Rate and Rhythm: Normal rate and regular rhythm.  Pulmonary:     Effort: Pulmonary effort is normal. No respiratory distress.     Breath sounds: Normal breath sounds. No wheezing or rales.  Abdominal:     General: Bowel sounds are normal. There is no distension.     Palpations: Abdomen is soft.     Tenderness: There is no abdominal tenderness. There  is no rebound.  Skin:    General: Skin is warm and dry.  Neurological:     Mental Status: He is alert and oriented to person, place, and time.     Coordination: Coordination normal.     Vitals:   04/22/18 1029  BP: 120/80  Pulse: 81  Temp: 98.2 F (36.8 C)  TempSrc: Oral  SpO2: 96%  Weight: 244 lb (110.7 kg)  Height: 5\' 11"  (1.803 m)    Assessment & Plan:

## 2018-04-22 NOTE — Patient Instructions (Signed)
We will check the labs today and call you back about the results.    

## 2018-04-22 NOTE — Assessment & Plan Note (Signed)
Concerning given concurrent diabetes. POC u/a done to rule out infection and found glucosuria and ketones in urine. Checking HgA1c and BMP today to rule out AG and current level of control of sugars.

## 2018-04-22 NOTE — Assessment & Plan Note (Signed)
Having frequent urination as well as more thirst. POC U/A done in the office and he does have glucose and ketones in urine. Getting BMP and HgA1c to check level of control as well as for sugar level current and if there is any AG.

## 2018-04-22 NOTE — Assessment & Plan Note (Signed)
Concern for acute worsening with new polyuria and polydipsia with glucose and ketones in the urine with POC today. Checking BMP and HgA1c today and adjust as needed. Currently diet controlled and last HgA1c 6.7.

## 2018-06-25 ENCOUNTER — Ambulatory Visit: Payer: Federal, State, Local not specified - PPO | Admitting: Internal Medicine

## 2018-07-21 ENCOUNTER — Ambulatory Visit (INDEPENDENT_AMBULATORY_CARE_PROVIDER_SITE_OTHER): Payer: Federal, State, Local not specified - PPO | Admitting: Internal Medicine

## 2018-07-21 ENCOUNTER — Encounter: Payer: Self-pay | Admitting: Internal Medicine

## 2018-07-21 ENCOUNTER — Other Ambulatory Visit: Payer: Self-pay

## 2018-07-21 ENCOUNTER — Other Ambulatory Visit (INDEPENDENT_AMBULATORY_CARE_PROVIDER_SITE_OTHER): Payer: Federal, State, Local not specified - PPO

## 2018-07-21 VITALS — BP 120/78 | HR 91 | Temp 98.4°F | Ht 71.0 in | Wt 240.0 lb

## 2018-07-21 DIAGNOSIS — E1165 Type 2 diabetes mellitus with hyperglycemia: Secondary | ICD-10-CM | POA: Diagnosis not present

## 2018-07-21 DIAGNOSIS — F1010 Alcohol abuse, uncomplicated: Secondary | ICD-10-CM

## 2018-07-21 DIAGNOSIS — J449 Chronic obstructive pulmonary disease, unspecified: Secondary | ICD-10-CM

## 2018-07-21 DIAGNOSIS — Z0000 Encounter for general adult medical examination without abnormal findings: Secondary | ICD-10-CM | POA: Diagnosis not present

## 2018-07-21 LAB — COMPREHENSIVE METABOLIC PANEL
ALT: 73 U/L — ABNORMAL HIGH (ref 0–53)
AST: 89 U/L — ABNORMAL HIGH (ref 0–37)
Albumin: 3.7 g/dL (ref 3.5–5.2)
Alkaline Phosphatase: 176 U/L — ABNORMAL HIGH (ref 39–117)
BUN: 9 mg/dL (ref 6–23)
CO2: 24 mEq/L (ref 19–32)
Calcium: 8.7 mg/dL (ref 8.4–10.5)
Chloride: 97 mEq/L (ref 96–112)
Creatinine, Ser: 0.83 mg/dL (ref 0.40–1.50)
GFR: 94.26 mL/min (ref >60.00)
Glucose, Bld: 234 mg/dL — ABNORMAL HIGH (ref 70–99)
Potassium: 3.7 mEq/L (ref 3.5–5.1)
Sodium: 135 mEq/L (ref 135–145)
Total Bilirubin: 0.4 mg/dL (ref 0.2–1.2)
Total Protein: 6.3 g/dL (ref 6.0–8.3)

## 2018-07-21 LAB — VITAMIN B12: Vitamin B-12: 605 pg/mL (ref 211–911)

## 2018-07-21 LAB — CBC
HCT: 38.6 % — ABNORMAL LOW (ref 39.0–52.0)
Hemoglobin: 13.8 g/dL (ref 13.0–17.0)
MCHC: 35.9 g/dL (ref 30.0–36.0)
MCV: 100.1 fl — ABNORMAL HIGH (ref 78.0–100.0)
Platelets: 211 10*3/uL (ref 150.0–400.0)
RBC: 3.86 Mil/uL — ABNORMAL LOW (ref 4.22–5.81)
RDW: 14.6 % (ref 11.5–15.5)
WBC: 3.9 10*3/uL — ABNORMAL LOW (ref 4.0–10.5)

## 2018-07-21 LAB — LDL CHOLESTEROL, DIRECT: Direct LDL: 41 mg/dL

## 2018-07-21 LAB — LIPID PANEL
Cholesterol: 259 mg/dL — ABNORMAL HIGH (ref 0–200)
HDL: 28.5 mg/dL — ABNORMAL LOW (ref >39.00)
Total CHOL/HDL Ratio: 9
Triglycerides: 3178 mg/dL — ABNORMAL HIGH (ref 0.0–149.0)

## 2018-07-21 LAB — HEMOGLOBIN A1C: Hgb A1c MFr Bld: 10.9 % — ABNORMAL HIGH (ref 4.6–6.5)

## 2018-07-21 LAB — TSH: TSH: 1.7 u[IU]/mL (ref 0.35–4.50)

## 2018-07-21 NOTE — Assessment & Plan Note (Signed)
Still drinking alcohol every night and counseled again about the health benefits of avoiding this. This does contribute to his diabetes.

## 2018-07-21 NOTE — Assessment & Plan Note (Signed)
Flu shot up to date. Pneumonia up to date due at 45. Shingrix complete. Tetanus up to date. Colonoscopy up to date. Counseled about sun safety and mole surveillance. Counseled about the dangers of distracted driving. Given 10 year screening recommendations.

## 2018-07-21 NOTE — Assessment & Plan Note (Signed)
Not smoking currently and using albuterol prn. Some SOB.

## 2018-07-21 NOTE — Assessment & Plan Note (Signed)
Last HgA1c not controlled. He is taking metformin 500 mg BID since last visit and needs recheck as well as lipid panel. Taking crestor. Not on ACE-I or ARB at this time and last microalbumin to creatinine ratio without signs of microalbumin. Adjust as needed. No eye exam and talked to him about the importance of this yearly with his diabetes.

## 2018-07-21 NOTE — Patient Instructions (Signed)

## 2018-07-21 NOTE — Progress Notes (Signed)
   Subjective:   Patient ID: [name] [name], male    DOB: [birth year], 63 y.o.   MRN: [id number]  HPI The patient is a 63 YO man coming in for physical. Denies any new complaints. No eye exam recently. Denies vision changes or problems. Still not smoking.  PMH, [facility], social history reviewed and updated  Review of Systems  Constitutional: Negative.   HENT: Negative.   Eyes: Negative.   Respiratory: Positive for shortness of breath. Negative for cough and chest tightness.        Rare, stable  Cardiovascular: Negative for chest pain, palpitations and leg swelling.  Gastrointestinal: Negative for abdominal distention, abdominal pain, constipation, diarrhea, nausea and vomiting.  Musculoskeletal: Negative.   Skin: Negative.   Neurological: Negative.   Psychiatric/Behavioral: Negative.     Objective:  Physical Exam Constitutional:      Appearance: He is well-developed. He is obese.  HENT:     Head: Normocephalic and atraumatic.     Right Ear: Tympanic membrane normal.     Left Ear: Tympanic membrane normal.     Mouth/Throat:     Pharynx: Oropharynx is clear.  Neck:     Musculoskeletal: Normal range of motion.     Comments: Carotids without bruit bilaterally Cardiovascular:     Rate and Rhythm: Normal rate and regular rhythm.  Pulmonary:     Effort: Pulmonary effort is normal. No respiratory distress.     Breath sounds: Normal breath sounds. No wheezing or rales.  Abdominal:     General: Bowel sounds are normal. There is no distension.     Palpations: Abdomen is soft.     Tenderness: There is no abdominal tenderness. There is no rebound.  Skin:    General: Skin is warm and dry.  Neurological:     Mental Status: He is alert and oriented to person, place, and time.     Coordination: Coordination normal.     Vitals:   07/21/18 0834  BP: 120/78  Pulse: 91  Temp: 98.4 F (36.9 C)  TempSrc: Oral  SpO2: 95%  Weight: 240 lb (108.9 kg)  Height: 5\' 11"  (1.803 m)     Assessment & Plan:

## 2018-07-22 ENCOUNTER — Other Ambulatory Visit: Payer: Self-pay | Admitting: Internal Medicine

## 2018-07-22 MED ORDER — METFORMIN HCL 1000 MG PO TABS: 1000.0000 mg | ORAL_TABLET | Freq: Two times a day (BID) | ORAL | 1 refills | Status: DC

## 2018-10-09 ENCOUNTER — Other Ambulatory Visit: Payer: Self-pay

## 2018-10-09 ENCOUNTER — Emergency Department (HOSPITAL_COMMUNITY)
Admission: EM | Admit: 2018-10-09 | Discharge: 2018-10-09 | Disposition: A | Discharge status: Home / Self Care | Payer: Federal, State, Local not specified - PPO | Attending: Emergency Medicine | Admitting: Emergency Medicine

## 2018-10-09 ENCOUNTER — Encounter: Payer: Self-pay | Admitting: Internal Medicine

## 2018-10-09 ENCOUNTER — Emergency Department (HOSPITAL_COMMUNITY): Payer: Federal, State, Local not specified - PPO

## 2018-10-09 ENCOUNTER — Ambulatory Visit: Payer: Federal, State, Local not specified - PPO | Admitting: Internal Medicine

## 2018-10-09 ENCOUNTER — Encounter (HOSPITAL_COMMUNITY): Payer: Self-pay | Admitting: Emergency Medicine

## 2018-10-09 ENCOUNTER — Ambulatory Visit (INDEPENDENT_AMBULATORY_CARE_PROVIDER_SITE_OTHER): Payer: Federal, State, Local not specified - PPO

## 2018-10-09 VITALS — BP 122/82 | HR 66 | Temp 97.3°F | Ht 71.0 in | Wt 241.4 lb

## 2018-10-09 DIAGNOSIS — Z23 Encounter for immunization: Secondary | ICD-10-CM

## 2018-10-09 DIAGNOSIS — J449 Chronic obstructive pulmonary disease, unspecified: Secondary | ICD-10-CM | POA: Insufficient documentation

## 2018-10-09 DIAGNOSIS — G4733 Obstructive sleep apnea (adult) (pediatric): Secondary | ICD-10-CM | POA: Diagnosis not present

## 2018-10-09 DIAGNOSIS — Z79899 Other long term (current) drug therapy: Secondary | ICD-10-CM | POA: Insufficient documentation

## 2018-10-09 DIAGNOSIS — S91311A Laceration without foreign body, right foot, initial encounter: Secondary | ICD-10-CM | POA: Diagnosis present

## 2018-10-09 DIAGNOSIS — E119 Type 2 diabetes mellitus without complications: Secondary | ICD-10-CM | POA: Diagnosis not present

## 2018-10-09 DIAGNOSIS — Z87891 Personal history of nicotine dependence: Secondary | ICD-10-CM | POA: Diagnosis not present

## 2018-10-09 DIAGNOSIS — Y929 Unspecified place or not applicable: Secondary | ICD-10-CM | POA: Diagnosis not present

## 2018-10-09 DIAGNOSIS — Y999 Unspecified external cause status: Secondary | ICD-10-CM | POA: Insufficient documentation

## 2018-10-09 DIAGNOSIS — W25XXXA Contact with sharp glass, initial encounter: Secondary | ICD-10-CM | POA: Diagnosis not present

## 2018-10-09 DIAGNOSIS — I10 Essential (primary) hypertension: Secondary | ICD-10-CM | POA: Insufficient documentation

## 2018-10-09 DIAGNOSIS — Y9301 Activity, walking, marching and hiking: Secondary | ICD-10-CM | POA: Diagnosis not present

## 2018-10-09 DIAGNOSIS — J8410 Pulmonary fibrosis, unspecified: Secondary | ICD-10-CM | POA: Diagnosis not present

## 2018-10-09 DIAGNOSIS — E782 Mixed hyperlipidemia: Secondary | ICD-10-CM | POA: Insufficient documentation

## 2018-10-09 DIAGNOSIS — Z7984 Long term (current) use of oral hypoglycemic drugs: Secondary | ICD-10-CM | POA: Insufficient documentation

## 2018-10-09 MED ORDER — TETANUS-DIPHTH-ACELL PERTUSSIS 5-2.5-18.5 LF-MCG/0.5 IM SUSP
0.5000 mL | Freq: Once | INTRAMUSCULAR | Status: AC
  Administered 2018-10-09: 0.5 mL via INTRAMUSCULAR
  Filled 2018-10-09: qty 0.5

## 2018-10-09 MED ORDER — CIPROFLOXACIN HCL 500 MG PO TABS: 500.0000 mg | ORAL_TABLET | Freq: Two times a day (BID) | ORAL | 0 refills | Status: DC

## 2018-10-09 NOTE — Progress Notes (Signed)
HPI male smoker followed for OSA, COPD, Interstitial Fibrosis, tobacco use, complicated by obesity, PTSD/anxiety/depression, history syncope with RBBB/left hemiblock/ Dr Einar Gip  NPSG 11/12/1998- RDI/ AHI 75.8/hr. BiPAP 25/20 RDI 0/hr, tested in [place] PFT 06/14/11-within normal. FEV1 4.01/112%, FEV1/FVC 0.71, slight response to bronchodilator in small airways,  DLCO 87% Office Spirometry 06/24/17-WNL-FVC 4.74/95%, FEV1 3.21/85%, ratio 0.68, FEF 25-75% 2.09/67% -------------------------------------------------------------------.   12/25/2017- 63 year old male smoker followed for OSA, COPD, tobacco use, complicated by obesity, PTSD/anxiety/depression, history syncope with RBBB/left hemiblock/ Dr Einar Gip     Wife here CPAP auto 5-15/[facility] Download 100% compliance AHI 1.5/hour Clonazepam for sleep -----[facility]A: DME [facility]. Pt wears CPAP nightly and DL attached. No new supplies needed at this time.  Here with his wife.  He has quit smoking cigarettes, changing to low nicotine Vapes with intention to gradually taper off.  Chronic cough has substantially improved with this change.  He notices dyspnea on exertion associated with unusual exertion such as long walks uphill.  Denies chest pain or palpitation.  No discolored sputum.  Not using Anoro after finding sample made no difference.  He does not feel he needs inhalers now.  Wants to keep rescue inhaler available. He is comfortable with his CPAP, confirmed by his wife and by excellent compliance data.  He reports he can no longer sleep comfortably without CPAP. We reviewed CXR report and images with attention to "fibrosis". CXR 06/24/2017- 1.  No acute cardiopulmonary disease. 2. COPD and interstitial pulmonary fibrosis, unchanged since 2017 though progressive since 2015.  10/09/2018- 63 year old male smoker(now Vaping) followed for OSA, COPD, Interstitial Fibrosis, tobacco use, complicated by obesity, PTSD/anxiety/depression, history syncope with  RBBB/left hemiblock/ Dr Einar Gip      CPAP auto 5-15/[facility] Download compliance 100%, AHI 0.6/ hr -----OSA on CPAP Auto 5-15, DME: [facility]; no complaints Body weight today 241 lbs Comfortable using CPAP every night. Albuterol hfa not needed routinely. No exacerbations and no routine cough or wheeze. Does still vape against advice. Admits some DOE mowing yard.  ROS-see HPI + = positive Constitutional:   No-   weight loss, night sweats, fevers, chills, fatigue, lassitude. HEENT:   No-  headaches, difficulty swallowing, tooth/dental problems, sore throat,       No-  sneezing, itching, ear ache, +nasal congestion, post nasal drip,  CV:  No-   chest pain, orthopnea, PND, swelling in lower extremities, anasarca, dizziness, palpitations Resp: +   shortness of breath with exertion or at rest.                 productive cough,   non-productive cough,  No- coughing up of blood.              No-   change in color of mucus.  No- wheezing.   Skin: No-   rash or lesions. GI:  No-   heartburn, indigestion, abdominal pain, nausea, vomiting,  GU:  MS:  No-   joint pain or swelling.  + back pain. Neuro-    No acute issue Psych:  No- change in mood or affect. +depression or anxiety.  No memory loss.  OBJ- Physical Exam General- Alert, Oriented, Affect-appropriate, Distress- none acute, + overweight.  Skin- rash-none, lesions- none, excoriation- none,  Lymphadenopathy- none Head- atraumatic            Eyes- Gross vision intact, PERRLA, conjunctivae and secretions clear            Ears- Hearing, canals-normal  Nose- +turbinate edema, no-Septal dev, mucus, polyps, erosion, perforation             Throat- Mallampati III , mucosa clear , drainage- none, tonsils- atrophic, + missing tooth Neck- flexible , trachea midline, no stridor , thyroid nl, carotid no bruit Chest - symmetrical excursion , unlabored           Heart/CV- RRR , no murmur , no gallop  , no rub, nl s1 s2                           -  JVD- none , edema- none, stasis changes- none, varices- none           Lung- + clear, unlabored, wheeze- none, cough- none , dullness-none, rub- none, crackles-none           Chest wall-  Abd- Br/ Gen/ Rectal- Not done, not indicated Extrem- cyanosis- none, clubbing, none, atrophy- none, strength- nl Neuro- grossly intact to observation

## 2018-10-09 NOTE — Assessment & Plan Note (Signed)
Stable as of CXR 207> 2019. 

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
## 2018-10-09 NOTE — Discharge Instructions (Addendum)
Keep the area clean and dry.  Follow-up with your doctor for further evaluation and recheck of the wound.

## 2018-10-09 NOTE — Patient Instructions (Signed)
Order- CXR   COPD mixed type, tobacco user  Order- Flu vax standard  Please call if we can help

## 2018-10-09 NOTE — Assessment & Plan Note (Signed)
Continues to benefit from CPAP, confirmed by download. Plan continue CPAP auto 5-15

## 2018-10-09 NOTE — ED Notes (Signed)
Placed pt right foot in sterile NS bath per Dalia Heading PA verbal order.

## 2018-10-09 NOTE — ED Triage Notes (Signed)
Pt reports piece glass went through his shoe and into right foot when walking down the side walk.

## 2018-10-09 NOTE — Assessment & Plan Note (Signed)
Very mild obstruction, symptomatically minor with nonspecific mild dyspnea on exertion sometimes mowing the yard.  Plan- stop vaping, keep rescue inhaler available

## 2018-12-18 ENCOUNTER — Ambulatory Visit (INDEPENDENT_AMBULATORY_CARE_PROVIDER_SITE_OTHER): Payer: Federal, State, Local not specified - PPO | Admitting: Internal Medicine

## 2018-12-18 ENCOUNTER — Encounter: Payer: Self-pay | Admitting: Internal Medicine

## 2018-12-18 ENCOUNTER — Other Ambulatory Visit: Payer: Self-pay

## 2018-12-18 ENCOUNTER — Other Ambulatory Visit (INDEPENDENT_AMBULATORY_CARE_PROVIDER_SITE_OTHER): Payer: Federal, State, Local not specified - PPO

## 2018-12-18 VITALS — BP 142/90 | HR 80 | Temp 98.3°F | Ht 71.0 in | Wt 252.0 lb

## 2018-12-18 DIAGNOSIS — E1169 Type 2 diabetes mellitus with other specified complication: Secondary | ICD-10-CM

## 2018-12-18 DIAGNOSIS — E1165 Type 2 diabetes mellitus with hyperglycemia: Secondary | ICD-10-CM

## 2018-12-18 DIAGNOSIS — I6523 Occlusion and stenosis of bilateral carotid arteries: Secondary | ICD-10-CM | POA: Diagnosis not present

## 2018-12-18 DIAGNOSIS — E118 Type 2 diabetes mellitus with unspecified complications: Secondary | ICD-10-CM

## 2018-12-18 DIAGNOSIS — I6529 Occlusion and stenosis of unspecified carotid artery: Secondary | ICD-10-CM | POA: Insufficient documentation

## 2018-12-18 DIAGNOSIS — E785 Hyperlipidemia, unspecified: Secondary | ICD-10-CM

## 2018-12-18 DIAGNOSIS — IMO0002 Reserved for concepts with insufficient information to code with codable children: Secondary | ICD-10-CM

## 2018-12-18 LAB — COMPREHENSIVE METABOLIC PANEL
ALT: 57 U/L — ABNORMAL HIGH (ref 0–53)
AST: 50 U/L — ABNORMAL HIGH (ref 0–37)
Albumin: 4.4 g/dL (ref 3.5–5.2)
Alkaline Phosphatase: 161 U/L — ABNORMAL HIGH (ref 39–117)
BUN: 13 mg/dL (ref 6–23)
CO2: 27 mEq/L (ref 19–32)
Calcium: 9.2 mg/dL (ref 8.4–10.5)
Chloride: 99 mEq/L (ref 96–112)
Creatinine, Ser: 0.85 mg/dL (ref 0.40–1.50)
GFR: 91.58 mL/min (ref >60.00)
Glucose, Bld: 170 mg/dL — ABNORMAL HIGH (ref 70–99)
Potassium: 3.8 mEq/L (ref 3.5–5.1)
Sodium: 138 mEq/L (ref 135–145)
Total Bilirubin: 0.4 mg/dL (ref 0.2–1.2)
Total Protein: 7 g/dL (ref 6.0–8.3)

## 2018-12-18 LAB — LIPID PANEL
Cholesterol: 107 mg/dL (ref 0–200)
HDL: 31 mg/dL — ABNORMAL LOW (ref >39.00)
Total CHOL/HDL Ratio: 3
Triglycerides: 770 mg/dL — ABNORMAL HIGH (ref 0.0–149.0)

## 2018-12-18 LAB — HEMOGLOBIN A1C: Hgb A1c MFr Bld: 7 % — ABNORMAL HIGH (ref 4.6–6.5)

## 2018-12-18 LAB — LDL CHOLESTEROL, DIRECT: Direct LDL: 20 mg/dL

## 2018-12-18 NOTE — Assessment & Plan Note (Signed)
Noted on recent CXR from pulmonary. He is on aspirin and statin. Recent carotid doppler 2019 with 1-39% blockage bilateral which is stable from 2014 and retesting is not indicated currently.

## 2018-12-18 NOTE — Progress Notes (Signed)
   Subjective:   Patient ID: [name] [name], male    DOB: 1955/03/05, 63 y.o.   MRN: [id number]  HPI The patient is a 63 YO man coming in for follow up diabetes. Home meter with readings around 220 prior to meal and 180 after. Taking metformin 1000 mg BID since June. Did not come for follow up as recommended. Started exercise and low carb diet about 2 weeks ago. Has not seen noticeable difference yet. Concerned as a nephew just had toe amputation at 83 and he is worried about this. Denies numbness or tingling in feet.   Review of Systems  Constitutional: Negative.   HENT: Negative.   Eyes: Negative.   Respiratory: Negative for cough, chest tightness and shortness of breath.   Cardiovascular: Negative for chest pain, palpitations and leg swelling.  Gastrointestinal: Negative for abdominal distention, abdominal pain, constipation, diarrhea, nausea and vomiting.  Musculoskeletal: Negative.   Skin: Negative.   Neurological: Negative.   Psychiatric/Behavioral: Negative.     Objective:  Physical Exam Constitutional:      Appearance: He is well-developed. He is obese.  HENT:     Head: Normocephalic and atraumatic.  Neck:     Musculoskeletal: Normal range of motion.  Cardiovascular:     Rate and Rhythm: Normal rate and regular rhythm.  Pulmonary:     Effort: Pulmonary effort is normal. No respiratory distress.     Breath sounds: Normal breath sounds. No wheezing or rales.  Abdominal:     General: Bowel sounds are normal. There is no distension.     Palpations: Abdomen is soft.     Tenderness: There is no abdominal tenderness. There is no rebound.  Skin:    General: Skin is warm and dry.  Neurological:     Mental Status: He is alert and oriented to person, place, and time.     Coordination: Coordination normal.     Vitals:   12/18/18 0933  BP: (!) 142/90  Pulse: 80  Temp: 98.3 F (36.8 C)  TempSrc: Oral  SpO2: 96%  Weight: 252 lb (114.3 kg)  Height: 5\' 11"  (1.803 m)     Assessment & Plan:  Visit time 25 minutes: greater than 50% of that time was spent in face to face counseling and coordination of care with the patient: counseled about diabetes and complications and coordination of his care as well as finding of carotid calcification with recent check 2019 and reviewed results of this

## 2018-12-18 NOTE — Assessment & Plan Note (Signed)
Taking crestor. Recheck lipid panel today and adjust as needed.

## 2018-12-18 NOTE — Assessment & Plan Note (Signed)
Checking HgA1c, has recent changed diet to low carb 2 weeks ago. Average sugar on meter 177. Checking hgA1c and adjust as needed. Taking metformin 1000 mg BID.

## 2018-12-18 NOTE — Patient Instructions (Signed)
Diabetes Mellitus and Standards of Medical Care Managing diabetes (diabetes mellitus) can be complicated. Your diabetes treatment may be managed by a team of health care providers, including:  A physician who specializes in diabetes (endocrinologist).  A nurse practitioner or physician assistant.  Nurses.  A diet and nutrition specialist (registered dietitian).  A certified diabetes educator (CDE).  An exercise specialist.  A pharmacist.  An eye doctor.  A foot specialist (podiatrist).  A dentist.  A primary care provider.  A mental health provider. Your health care providers follow guidelines to help you get the best quality of care. The following schedule is a general guideline for your diabetes management plan. Your health care providers may give you more specific instructions. Physical exams Upon being diagnosed with diabetes mellitus, and each year after that, your health care provider will ask about your medical and family history. He or she will also do a physical exam. Your exam may include:  Measuring your height, weight, and body mass index (BMI).  Checking your blood pressure. This will be done at every routine medical visit. Your target blood pressure may vary depending on your medical conditions, your age, and other factors.  Thyroid gland exam.  Skin exam.  Screening for damage to your nerves (peripheral neuropathy). This may include checking the pulse in your legs and feet and checking the level of sensation in your hands and feet.  A complete foot exam to inspect the structure and skin of your feet, including checking for cuts, bruises, redness, blisters, sores, or other problems.  Screening for blood vessel (vascular) problems, which may include checking the pulse in your legs and feet and checking your temperature. Blood tests Depending on your treatment plan and your personal needs, you may have the following tests done:  HbA1c (hemoglobin A1c). This  test provides information about blood sugar (glucose) control over the previous 2-3 months. It is used to adjust your treatment plan, if needed. This test will be done: ? At least 2 times a year, if you are meeting your treatment goals. ? 4 times a year, if you are not meeting your treatment goals or if treatment goals have changed.  Lipid testing, including total, LDL, and HDL cholesterol and triglyceride levels. ? The goal for LDL is less than 100 mg/dL (5.5 mmol/L). If you are at high risk for complications, the goal is less than 70 mg/dL (3.9 mmol/L). ? The goal for HDL is 40 mg/dL (2.2 mmol/L) or higher for men and 50 mg/dL (2.8 mmol/L) or higher for women. An HDL cholesterol of 60 mg/dL (3.3 mmol/L) or higher gives some protection against heart disease. ? The goal for triglycerides is less than 150 mg/dL (8.3 mmol/L).  Liver function tests.  Kidney function tests.  Thyroid function tests. Dental and eye exams  Visit your dentist two times a year.  If you have type 1 diabetes, your health care provider may recommend an eye exam 3-5 years after you are diagnosed, and then once a year after your first exam. ? For children with type 1 diabetes, a health care provider may recommend an eye exam when your child is age 10 or older and has had diabetes for 3-5 years. After the first exam, your child should get an eye exam once a year.  If you have type 2 diabetes, your health care provider may recommend an eye exam as soon as you are diagnosed, and then once a year after your first exam. Immunizations   The  yearly flu (influenza) vaccine is recommended for everyone 6 months or older who has diabetes.  The pneumonia (pneumococcal) vaccine is recommended for everyone 2 years or older who has diabetes. If you are 65 or older, you may get the pneumonia vaccine as a series of two separate shots.  The hepatitis B vaccine is recommended for adults shortly after being diagnosed with diabetes.   Adults and children with diabetes should receive all other vaccines according to age-specific recommendations from the Centers for Disease Control and Prevention (CDC). Mental and emotional health Screening for symptoms of eating disorders, anxiety, and depression is recommended at the time of diagnosis and afterward as needed. If your screening shows that you have symptoms (positive screening result), you may need more evaluation and you may work with a mental health care provider. Treatment plan Your treatment plan will be reviewed at every medical visit. You and your health care provider will discuss:  How you are taking your medicines, including insulin.  Any side effects you are experiencing.  Your blood glucose target goals.  The frequency of your blood glucose monitoring.  Lifestyle habits, such as activity level as well as tobacco, alcohol, and substance use. Diabetes self-management education Your health care provider will assess how well you are monitoring your blood glucose levels and whether you are taking your insulin correctly. He or she may refer you to:  A certified diabetes educator to manage your diabetes throughout your life, starting at diagnosis.  A registered dietitian who can create or review your personal nutrition plan.  An exercise specialist who can discuss your activity level and exercise plan. Summary  Managing diabetes (diabetes mellitus) can be complicated. Your diabetes treatment may be managed by a team of health care providers.  Your health care providers follow guidelines in order to help you get the best quality of care.  Standards of care including having regular physical exams, blood tests, blood pressure monitoring, immunizations, screening tests, and education about how to manage your diabetes.  Your health care providers may also give you more specific instructions based on your individual health. This information is not intended to replace  advice given to you by your health care provider. Make sure you discuss any questions you have with your health care provider. Document Released: 12/03/2008 Document Revised: 10/25/2017 Document Reviewed: 11/04/2015 Elsevier Patient Education  2020 Elsevier Inc.  

## 2018-12-22 ENCOUNTER — Other Ambulatory Visit: Payer: Self-pay | Admitting: Internal Medicine

## 2018-12-22 DIAGNOSIS — E781 Pure hyperglyceridemia: Secondary | ICD-10-CM

## 2018-12-26 ENCOUNTER — Other Ambulatory Visit: Payer: Self-pay

## 2018-12-26 ENCOUNTER — Encounter: Payer: Self-pay | Admitting: Internal Medicine

## 2018-12-26 ENCOUNTER — Ambulatory Visit: Payer: Federal, State, Local not specified - PPO | Admitting: Internal Medicine

## 2018-12-26 VITALS — BP 123/73 | HR 63 | Temp 97.2°F | Ht 71.0 in | Wt 256.0 lb

## 2018-12-26 DIAGNOSIS — I6523 Occlusion and stenosis of bilateral carotid arteries: Secondary | ICD-10-CM

## 2018-12-26 DIAGNOSIS — E1165 Type 2 diabetes mellitus with hyperglycemia: Secondary | ICD-10-CM | POA: Diagnosis not present

## 2018-12-26 DIAGNOSIS — E781 Pure hyperglyceridemia: Secondary | ICD-10-CM

## 2018-12-26 MED ORDER — ICOSAPENT ETHYL 1 G PO CAPS: 2.0000 g | ORAL_CAPSULE | Freq: Two times a day (BID) | ORAL | 3 refills | Status: DC

## 2018-12-26 NOTE — Progress Notes (Signed)
LIPID CLINIC CONSULT NOTE  Chief Complaint:  Manage dyslipidemia  Primary Care Physician: [name] Koch, MD  Primary Cardiologist:  No primary care provider on file.  HPI:  [name] [name] is a 63 y.o. male who is being seen today for the evaluation of dyslipidemia at the request of [name] [name], *.  This is a pleasant 63 year old male with a history of dyslipidemia, COPD, bilateral carotid artery stenosis, obstructive sleep apnea and type 2 diabetes specifically elevated triglycerides.  He is referred by Dr. Sharlet [name] for evaluation and management of this.  He reports family history of heart disease and diabetes in his father however much later in life and he died at age 51.  Currently is on rosuvastatin 20mg  and over-the-counter fish oil.  His most recent lipid profile on December 18, 2018 showed total cholesterol 107, triglycerides 770, HDL 31.  A direct LDL was performed and resulted at 20 mg/dL.  The total cholesterol had reduced significantly from 5 months ago where it was over 3000.  He is made significant dietary changes and has had improvement in glycemic control.  His hemoglobin A1c 5 months ago was also 10.9 and currently at 7.0.  There is some history of alcohol use in the past which has improved somewhat recently and may be contributing to his triglycerides.  PMHx:  Past Medical History:  Diagnosis Date  . Anxiety   . COPD (chronic obstructive pulmonary disease) ([name] [name])   . Depression    " post traumatic stress disorder" sees MD in [place] every 3-6 months.  . Dysrhythmia    hx. Bundle branch block- saw [name] [name]  . Irregular heart rate   . Sleep apnea    cpap used with nose clip- Dr. Baird [name] follows  . Substance abuse ([name] [name])    "tends to self medicate(Rx. meds or Alcohol) trying to get relief from "PSTD"    Past Surgical History:  Procedure Laterality Date  . COLONOSCOPY WITH PROPOFOL N/A 01/19/2014   Procedure: COLONOSCOPY WITH  PROPOFOL;  Surgeon: [name] Craver, MD;  Location: [facility];  Service: Endoscopy;  Laterality: N/A;    FAMHx:  Family History  Problem Relation Age of Onset  . Diabetes Father   . Heart Problems Father   . Heart disease Father   . Mental illness Father     SOCHx:   reports that he has quit smoking. His smoking use included cigarettes. He started smoking about 16 months ago. He has a 45.00 pack-year smoking history. He has never used smokeless tobacco. He reports current alcohol use. He reports that he does not use drugs.  ALLERGIES:  No Known Allergies  ROS: Pertinent items noted in HPI and remainder of comprehensive ROS otherwise negative.  HOME MEDS: Current Outpatient Medications on File Prior to Visit  Medication Sig Dispense Refill  . albuterol (PROVENTIL HFA;VENTOLIN HFA) 108 (90 Base) MCG/ACT inhaler Inhale 2 puffs into the lungs every 6 (six) hours as needed for wheezing or shortness of breath. 1 Inhaler prn  . amLODipine (NORVASC) 5 MG tablet Take 1 tablet (5 mg total) by mouth daily. 90 tablet 3  . Ascorbic Acid (VITAMIN C) 1000 MG tablet Take 1,000 mg by mouth daily.    Marland Kitchen aspirin 325 MG tablet Take 325 mg by mouth daily.    . Cholecalciferol (VITAMIN D) 2000 UNITS tablet Take 2,000 Units by mouth daily.    . clonazePAM (KLONOPIN) 0.5 MG tablet TAKE 1 TABLET BY MOUTH TWICE DAILY AS NEEDED  60 tablet 5  . fluticasone (FLONASE) 50 MCG/ACT nasal spray Place 2 sprays into both nostrils daily.    Marland Kitchen LORazepam (ATIVAN) 0.5 MG tablet Take 0.5 mg by mouth at bedtime as needed for anxiety.     . metFORMIN (GLUCOPHAGE) 1000 MG tablet Take 1 tablet (1,000 mg total) by mouth 2 (two) times daily with a meal. 180 tablet 1  . oxcarbazepine (TRILEPTAL) 600 MG tablet Take 600 mg by mouth daily.   0  . paliperidone (INVEGA SUSTENNA) 156 MG/ML SUSP injection Inject 156 mg into the muscle every 30 (thirty) days.    . rosuvastatin (CRESTOR) 20 MG tablet TAKE 1 TABLET BY MOUTH EVERY DAY 90  tablet 3  . tiaGABine (GABITRIL) 4 MG tablet Take 4 mg by mouth at bedtime.    . vitamin B-12 (CYANOCOBALAMIN) 500 MCG tablet Take 500 mcg by mouth daily before supper.      No current facility-administered medications on file prior to visit.     LABS/IMAGING: No results found for this or any previous visit (from the past 48 hour(s)). No results found.  LIPID PANEL:    Component Value Date/Time   CHOL 107 12/18/2018 0958   TRIG (H) 12/18/2018 0958    770.0 Triglyceride is over 400; calculations on Lipids are invalid.   HDL 31.00 (L) 12/18/2018 0958   CHOLHDL 3 12/18/2018 0958   VLDL 66.0 (H) 01/31/2016 1628   LDLCALC 72 10/30/2013 1606   LDLDIRECT 20.0 12/18/2018 0958    WEIGHTS: Wt Readings from Last 3 Encounters:  12/26/18 256 lb (116.1 kg)  12/18/18 252 lb (114.3 kg)  10/09/18 241 lb 6.4 oz (109.5 kg)    VITALS: BP 123/73   Pulse 63   Temp (!) 97.2 F (36.2 C)   Ht 5\' 11"  (1.803 m)   Wt 256 lb (116.1 kg)   SpO2 95%   BMI 35.70 kg/m   EXAM: General appearance: alert and no distress Neck: no carotid bruit, no JVD and thyroid not enlarged, symmetric, no tenderness/mass/nodules Lungs: clear to auscultation bilaterally Heart: regular rate and rhythm, S1, S2 normal, no murmur, click, rub or gallop Abdomen: soft, non-tender; bowel sounds normal; no masses,  no organomegaly Extremities: extremities normal, atraumatic, no cyanosis or edema Pulses: 2+ and symmetric Skin: Skin color, texture, turgor normal. No rashes or lesions Neurologic: Grossly normal Psych: Flat affect  EKG: Deferred  ASSESSMENT: 1. Mixed dyslipidemia with high triglycerides 2. Bilateral carotid artery stenosis 3. Type 2 diabetes-controlled (hemoglobin A1c 7.0) 4. COPD 5. PTSD  PLAN: 1.   [name] has high triglycerides and LDL at target on statin therapy.  He takes over-the-counter fish oil and recently had significant improvement in his triglycerides however I suspect this was from  better glycemic control since his hemoglobin A1c is 3 points lower than it was and he may have also decreased alcohol intake.  That being said I recommend switching his fish oil over to Vascepa 2 g twice daily.  There are much clear guideline recommendations for this including the reduce it trial which suggested cardiovascular risk reduction in patients with diabetes and PAD (mild bilateral carotid artery stenosis).  We will plan a repeat lipid profile in 3 to 4 months with direct LDL.  Thanks again for the kind referral.    Pixie Casino, MD, FACC, [place] Director of the [facility] Diplomate of the American Board of Clinical Lipidology  Attending Cardiologist  Direct Dial: [phone]  Fax: [phone]  Website:  [name] Haakon Titsworth 12/26/2018, 4:26 PM

## 2018-12-26 NOTE — Patient Instructions (Signed)
Medication Instructions:  STOP fish oil  START vascepa 2 gram twice daily (2 capsules in AM, 2 capsules in PM) *If you need a refill on your cardiac medications before your next appointment, please call your pharmacy*  Lab Work: FASTING lab work in 3-4 months (before next appointment) If you have labs (blood work) drawn today and your tests are completely normal, you will receive your results only by: Marland Kitchen MyChart Message (if you have MyChart) OR . A paper copy in the mail If you have any lab test that is abnormal or we need to change your treatment, we will call you to review the results.  Testing/Procedures: NONE  Follow-Up: Dr. Debara Pickett recommends that you schedule a follow up visit with him the in the [place] in 3-4 months. Please have fasting blood work about 1 week prior to this visit and he will review the blood work results with you at your appointment.

## 2019-01-28 ENCOUNTER — Encounter: Payer: Self-pay | Admitting: Internal Medicine

## 2019-02-08 ENCOUNTER — Other Ambulatory Visit: Payer: Self-pay | Admitting: Internal Medicine

## 2019-03-03 ENCOUNTER — Other Ambulatory Visit: Payer: Self-pay | Admitting: Internal Medicine

## 2019-04-15 LAB — LIPID PANEL
Chol/HDL Ratio: 3.1 ratio (ref 0.0–5.0)
Cholesterol, Total: 95 mg/dL — ABNORMAL LOW (ref 100–199)
HDL: 31 mg/dL — ABNORMAL LOW (ref >39)
LDL Chol Calc (NIH): 14 mg/dL (ref 0–99)
Triglycerides: 364 mg/dL — ABNORMAL HIGH (ref 0–149)
VLDL Cholesterol Cal: 50 mg/dL — ABNORMAL HIGH (ref 5–40)

## 2019-04-15 LAB — LDL CHOLESTEROL, DIRECT: LDL Direct: 27 mg/dL (ref 0–99)

## 2019-04-23 ENCOUNTER — Encounter: Payer: Self-pay | Admitting: Internal Medicine

## 2019-04-23 ENCOUNTER — Other Ambulatory Visit: Payer: Self-pay

## 2019-04-23 ENCOUNTER — Ambulatory Visit: Payer: Federal, State, Local not specified - PPO | Admitting: Internal Medicine

## 2019-04-23 VITALS — BP 116/60 | HR 89 | Temp 97.3°F | Ht 71.0 in | Wt 234.0 lb

## 2019-04-23 DIAGNOSIS — E781 Pure hyperglyceridemia: Secondary | ICD-10-CM | POA: Diagnosis not present

## 2019-04-23 DIAGNOSIS — Z7289 Other problems related to lifestyle: Secondary | ICD-10-CM

## 2019-04-23 DIAGNOSIS — Z789 Other specified health status: Secondary | ICD-10-CM

## 2019-04-23 DIAGNOSIS — I6523 Occlusion and stenosis of bilateral carotid arteries: Secondary | ICD-10-CM

## 2019-04-23 DIAGNOSIS — F1090 Alcohol use, unspecified, uncomplicated: Secondary | ICD-10-CM

## 2019-04-23 NOTE — Patient Instructions (Signed)
Medication Instructions:  Your physician recommends that you continue on your current medications as directed. Please refer to the Current Medication list given to you today.  *If you need a refill on your cardiac medications before your next appointment, please call your pharmacy*   Lab Work: FASTING lab work before your next visit in 1 year If you have labs (blood work) drawn today and your tests are completely normal, you will receive your results only by: Marland Kitchen MyChart Message (if you have MyChart) OR . A paper copy in the mail If you have any lab test that is abnormal or we need to change your treatment, we will call you to review the results.   Testing/Procedures: NONE   Follow-Up: At [facility], you and your health needs are our priority.  As part of our continuing mission to provide you with exceptional heart care, we have created designated Provider Care Teams.  These Care Teams include your primary Cardiologist (physician) and Advanced Practice Providers (APPs -  Physician Assistants and Nurse Practitioners) who all work together to provide you with the care you need, when you need it.  We recommend signing up for the patient portal called "MyChart".  Sign up information is provided on this After Visit Summary.  MyChart is used to connect with patients for Virtual Visits (Telemedicine).  Patients are able to view lab/test results, encounter notes, upcoming appointments, etc.  Non-urgent messages can be sent to your provider as well.   To learn more about what you can do with MyChart, go to [email or web address].    Your next appointment:   12 month(s) - lipid clinic  The format for your next appointment:   Either In Person or Virtual  Provider:   K. Mali Hilty, MD   Other Instructions

## 2019-04-23 NOTE — Progress Notes (Signed)
LIPID CLINIC CONSULT NOTE  Chief Complaint:  Manage dyslipidemia  Primary Care Physician: Hoyt Koch, MD  Primary Cardiologist:  No primary care provider on file.  HPI:  [name] [name] is a 64 y.o. male who is being seen today for the evaluation of dyslipidemia at the request of Hoyt Koch, *.  This is a pleasant 64 year old male with a history of dyslipidemia, COPD, bilateral carotid artery stenosis, obstructive sleep apnea and type 2 diabetes specifically elevated triglycerides.  He is referred by Dr. Sharlet Salina for evaluation and management of this.  He reports family history of heart disease and diabetes in his father however much later in life and he died at age 41.  Currently is on rosuvastatin 20mg  and over-the-counter fish oil.  His most recent lipid profile on December 18, 2018 showed total cholesterol 107, triglycerides 770, HDL 31.  A direct LDL was performed and resulted at 20 mg/dL.  The total cholesterol had reduced significantly from 5 months ago where it was over 3000.  He is made significant dietary changes and has had improvement in glycemic control.  His hemoglobin A1c 5 months ago was also 10.9 and currently at 7.0.  There is some history of alcohol use in the past which has improved somewhat recently and may be contributing to his triglycerides.  04/23/2019  [name] returns today for follow-up.  Overall he seems to be doing very well and managing his lipids.  His triglycerides have come down significantly.  Initially they were over 3000, then decreased to 770 and now 364, with total cholesterol 95, HDL 31 and LDL of 14.  At the direct LDL was measured at 27.  He reports no difficulty on Vascepa.  He is also decrease some of his alcohol use.  PMHx:  Past Medical History:  Diagnosis Date  . Anxiety   . COPD (chronic obstructive pulmonary disease) ([place])   . Depression    " post traumatic stress disorder" sees MD in [place] every 3-6 months.  .  Dysrhythmia    hx. Bundle branch block- saw Dr. Kathlee Nations  . Irregular heart rate   . Sleep apnea    cpap used with nose clip- Dr. Baird Lyons follows  . Substance abuse ([place])    "tends to self medicate(Rx. meds or Alcohol) trying to get relief from "PSTD"    Past Surgical History:  Procedure Laterality Date  . COLONOSCOPY WITH PROPOFOL N/A 01/19/2014   Procedure: COLONOSCOPY WITH PROPOFOL;  Surgeon: Juanita Craver, MD;  Location: [facility] ENDOSCOPY;  Service: Endoscopy;  Laterality: N/A;    FAMHx:  Family History  Problem Relation Age of Onset  . Diabetes Father   . Heart Problems Father   . Heart disease Father   . Mental illness Father     SOCHx:   reports that he has quit smoking. His smoking use included cigarettes. He started smoking about 19 months ago. He has a 45.00 pack-year smoking history. He has never used smokeless tobacco. He reports current alcohol use. He reports that he does not use drugs.  ALLERGIES:  No Known Allergies  ROS: Pertinent items noted in HPI and remainder of comprehensive ROS otherwise negative.  HOME MEDS: Current Outpatient Medications on File Prior to Visit  Medication Sig Dispense Refill  . Ascorbic Acid (VITAMIN C) 1000 MG tablet Take 1,000 mg by mouth daily.    Marland Kitchen aspirin 325 MG tablet Take 325 mg by mouth daily.    . Cholecalciferol (VITAMIN D)  2000 UNITS tablet Take 2,000 Units by mouth daily.    . clonazePAM (KLONOPIN) 0.5 MG tablet TAKE 1 TABLET BY MOUTH TWICE DAILY AS NEEDED 60 tablet 5  . Icosapent Ethyl 1 g CAPS Take 2 capsules (2 g total) by mouth 2 (two) times daily. 360 capsule 3  . LORazepam (ATIVAN) 0.5 MG tablet Take 0.5 mg by mouth at bedtime as needed for anxiety.     . metFORMIN (GLUCOPHAGE) 1000 MG tablet TAKE 1 TABLET(1000 MG) BY MOUTH TWICE DAILY WITH A MEAL 180 tablet 1  . naltrexone (DEPADE) 50 MG tablet Take 50 mg by mouth daily.    . paliperidone (INVEGA SUSTENNA) 156 MG/ML SUSP injection Inject 156 mg into the  muscle every 30 (thirty) days.    Marland Kitchen PARoxetine (PAXIL) 10 MG tablet     . rosuvastatin (CRESTOR) 20 MG tablet TAKE 1 TABLET BY MOUTH EVERY DAY 90 tablet 1  . tiaGABine (GABITRIL) 4 MG tablet Take 4 mg by mouth at bedtime.    . vitamin B-12 (CYANOCOBALAMIN) 500 MCG tablet Take 500 mcg by mouth daily before supper.      No current facility-administered medications on file prior to visit.    LABS/IMAGING: No results found for this or any previous visit (from the past 48 hour(s)). No results found.  LIPID PANEL:    Component Value Date/Time   CHOL 95 (L) 04/15/2019 0816   TRIG 364 (H) 04/15/2019 0816   HDL 31 (L) 04/15/2019 0816   CHOLHDL 3.1 04/15/2019 0816   CHOLHDL 3 12/18/2018 0958   VLDL 66.0 (H) 01/31/2016 1628   LDLCALC 14 04/15/2019 0816   LDLDIRECT 27 04/15/2019 0816   LDLDIRECT 20.0 12/18/2018 0958    WEIGHTS: Wt Readings from Last 3 Encounters:  04/23/19 234 lb (106.1 kg)  12/26/18 256 lb (116.1 kg)  12/18/18 252 lb (114.3 kg)    VITALS: BP 116/60   Pulse 89   Temp (!) 97.3 F (36.3 C)   Ht 5\' 11"  (1.803 m)   Wt 234 lb (106.1 kg)   SpO2 93%   BMI 32.64 kg/m   EXAM: Deferred  EKG: Deferred  ASSESSMENT: 1. Mixed dyslipidemia with high triglycerides 2. Bilateral carotid artery stenosis 3. Type 2 diabetes-controlled (hemoglobin A1c 7.0) 4. COPD 5. PTSD  PLAN: 1.   Mr. [name] [name] has had significant reduction in his triglycerides which continue to improve.  He is also made some dietary changes and I think he is optimized on the medicines that he is on currently.  While we could consider adding a fibrate, there is little additional risk reduction associated with that despite the numerical improvement in his triglycerides.  If for some reason his triglycerides were to rise further, would consider adding a fibrate to the Vascepa.  Otherwise plan to continue his current therapies.  We will see him back annually or sooner as necessary.  Pixie Casino, MD,  [facility], [place] Director of the Advanced Lipid Disorders &  Cardiovascular Risk Reduction Clinic Diplomate of the American Board of Clinical Lipidology Attending Cardiologist  Direct Dial: [phone]  Fax: [phone]  Website:  [email or web address] [email or web address] 04/23/2019, 4:23 PM

## 2019-04-24 ENCOUNTER — Encounter: Payer: Self-pay | Admitting: Internal Medicine

## 2019-05-13 ENCOUNTER — Telehealth: Payer: Self-pay | Admitting: Internal Medicine

## 2019-05-13 DIAGNOSIS — G4733 Obstructive sleep apnea (adult) (pediatric): Secondary | ICD-10-CM

## 2019-05-13 NOTE — Telephone Encounter (Signed)
Order- DME [facility]- please replace old, non-functioning CPAP machine auto 5-15. He prefers his AirSense 10 Auto machine. Mask of choice, humidifier, supplies, AirView/ card

## 2019-05-13 NOTE — Telephone Encounter (Signed)
Called and spoke to pt's wife, [name]. Pt's CPAP is no longer working and is requesting a new one. Pt has had his for about 5 years per the wife. Pt states he has been very happy with the machine and would like to stay with the same model type if possible. DME - [facility].   Dr. Annamaria Boots please advise if ok to order new machine. Thanks.

## 2019-05-13 NOTE — Telephone Encounter (Signed)
Spoke with [name]. She is aware that [facility] has approved the order. Will go ahead and place the order today. Nothing further needed at time of call.

## 2019-06-02 ENCOUNTER — Telehealth: Payer: Self-pay | Admitting: Internal Medicine

## 2019-06-02 MED ORDER — ALBUTEROL SULFATE HFA 108 (90 BASE) MCG/ACT IN AERS: 2.0000 | INHALATION_SPRAY | Freq: Four times a day (QID) | RESPIRATORY_TRACT | 5 refills | Status: DC | PRN

## 2019-06-02 NOTE — Telephone Encounter (Signed)
Called and spoke with pt's wife [name] and verified which inhaler pt was needing a refill of. I have sent rx for albuterol inhaler to pt's preferred pharmacy. Nothing further needed.

## 2019-07-27 ENCOUNTER — Ambulatory Visit (INDEPENDENT_AMBULATORY_CARE_PROVIDER_SITE_OTHER): Payer: Federal, State, Local not specified - PPO | Admitting: Internal Medicine

## 2019-07-27 ENCOUNTER — Other Ambulatory Visit: Payer: Self-pay

## 2019-07-27 ENCOUNTER — Encounter: Payer: Self-pay | Admitting: Internal Medicine

## 2019-07-27 VITALS — BP 126/86 | HR 85 | Temp 98.5°F | Ht 71.0 in | Wt 233.0 lb

## 2019-07-27 DIAGNOSIS — Z0000 Encounter for general adult medical examination without abnormal findings: Secondary | ICD-10-CM | POA: Diagnosis not present

## 2019-07-27 DIAGNOSIS — F1010 Alcohol abuse, uncomplicated: Secondary | ICD-10-CM | POA: Diagnosis not present

## 2019-07-27 DIAGNOSIS — E1169 Type 2 diabetes mellitus with other specified complication: Secondary | ICD-10-CM

## 2019-07-27 DIAGNOSIS — E118 Type 2 diabetes mellitus with unspecified complications: Secondary | ICD-10-CM | POA: Diagnosis not present

## 2019-07-27 DIAGNOSIS — R030 Elevated blood-pressure reading, without diagnosis of hypertension: Secondary | ICD-10-CM

## 2019-07-27 DIAGNOSIS — E1165 Type 2 diabetes mellitus with hyperglycemia: Secondary | ICD-10-CM

## 2019-07-27 DIAGNOSIS — IMO0002 Reserved for concepts with insufficient information to code with codable children: Secondary | ICD-10-CM

## 2019-07-27 DIAGNOSIS — E785 Hyperlipidemia, unspecified: Secondary | ICD-10-CM

## 2019-07-27 LAB — CBC
HCT: 40 % (ref 39.0–52.0)
Hemoglobin: 13.6 g/dL (ref 13.0–17.0)
MCHC: 34.1 g/dL (ref 30.0–36.0)
MCV: 102.8 fl — ABNORMAL HIGH (ref 78.0–100.0)
Platelets: 168 10*3/uL (ref 150.0–400.0)
RBC: 3.89 Mil/uL — ABNORMAL LOW (ref 4.22–5.81)
RDW: 13.8 % (ref 11.5–15.5)
WBC: 6.5 10*3/uL (ref 4.0–10.5)

## 2019-07-27 LAB — COMPREHENSIVE METABOLIC PANEL
ALT: 86 U/L — ABNORMAL HIGH (ref 0–53)
AST: 70 U/L — ABNORMAL HIGH (ref 0–37)
Albumin: 4.3 g/dL (ref 3.5–5.2)
Alkaline Phosphatase: 201 U/L — ABNORMAL HIGH (ref 39–117)
BUN: 11 mg/dL (ref 6–23)
CO2: 26 mEq/L (ref 19–32)
Calcium: 9.7 mg/dL (ref 8.4–10.5)
Chloride: 101 mEq/L (ref 96–112)
Creatinine, Ser: 0.61 mg/dL (ref 0.40–1.50)
GFR: 134.04 mL/min (ref >60.00)
Glucose, Bld: 144 mg/dL — ABNORMAL HIGH (ref 70–99)
Potassium: 3.9 mEq/L (ref 3.5–5.1)
Sodium: 136 mEq/L (ref 135–145)
Total Bilirubin: 0.3 mg/dL (ref 0.2–1.2)
Total Protein: 6.9 g/dL (ref 6.0–8.3)

## 2019-07-27 LAB — HEMOGLOBIN A1C: Hgb A1c MFr Bld: 7.6 % — ABNORMAL HIGH (ref 4.6–6.5)

## 2019-07-27 LAB — MICROALBUMIN / CREATININE URINE RATIO
Creatinine,U: 144.8 mg/dL
Microalb Creat Ratio: 0.7 mg/g (ref 0.0–30.0)
Microalb, Ur: 1 mg/dL (ref 0.0–1.9)

## 2019-07-27 MED ORDER — ROSUVASTATIN CALCIUM 20 MG PO TABS: 20.0000 mg | ORAL_TABLET | Freq: Every day | ORAL | 3 refills | Status: DC

## 2019-07-27 NOTE — Assessment & Plan Note (Signed)
Checking microalbumin and HgA1c. Taking metformin 1000 mg BID currently. On statin but not ACE-I/ARB. Adjust as needed. Reminded about eye exam.

## 2019-07-27 NOTE — Assessment & Plan Note (Signed)
Still with excessive alcohol usage of 6 beers per day. This is improvement from prior but we discussed still too many daily. Given his other co-morbidities would be best for abstinence and cut back to <2 drinks per day if able. He is not sure if he is able.

## 2019-07-27 NOTE — Assessment & Plan Note (Signed)
Monitoring still, at goal today. If needed should be ACE-I or ARB due to concurrent DM.

## 2019-07-27 NOTE — Assessment & Plan Note (Signed)
Flu shot due next season, covid-19 complete. Pneumonia up to date. Shingrix counseled. Tetanus up to date. Colonoscopy up to date. Counseled about sun safety and mole surveillance. Counseled about the dangers of distracted driving. Given 10 year screening recommendations.

## 2019-07-27 NOTE — Progress Notes (Signed)
° °  Subjective:   Patient ID: [name] [name], male    DOB: November 18, 1955, 64 y.o.   MRN: [id number]  HPI The patient is a 64 YO man coming in for physical. Is not drinking vodka any longer but drinking still about 6 low carb beers per day consistently.   PMH, [facility], social history reviewed and updated  Review of Systems  Constitutional: Negative.   HENT: Negative.   Eyes: Negative.   Respiratory: Negative for cough, chest tightness and shortness of breath.   Cardiovascular: Negative for chest pain, palpitations and leg swelling.  Gastrointestinal: Negative for abdominal distention, abdominal pain, constipation, diarrhea, nausea and vomiting.  Musculoskeletal: Negative.   Skin: Negative.   Neurological: Negative.   Psychiatric/Behavioral: Negative.     Objective:  Physical Exam Constitutional:      Appearance: He is well-developed. He is obese.  HENT:     Head: Normocephalic and atraumatic.  Cardiovascular:     Rate and Rhythm: Normal rate and regular rhythm.  Pulmonary:     Effort: Pulmonary effort is normal. No respiratory distress.     Breath sounds: Normal breath sounds. No wheezing or rales.  Abdominal:     General: Bowel sounds are normal. There is no distension.     Palpations: Abdomen is soft.     Tenderness: There is no abdominal tenderness. There is no rebound.  Musculoskeletal:     Cervical back: Normal range of motion.  Skin:    General: Skin is warm and dry.  Neurological:     Mental Status: He is alert and oriented to person, place, and time.     Coordination: Coordination normal.     Vitals:   07/27/19 1003  BP: 126/86  Pulse: 85  Temp: 98.5 F (36.9 C)  TempSrc: Oral  SpO2: 98%  Weight: 233 lb (105.7 kg)  Height: 5\' 11"  (1.803 m)    This visit occurred during the SARS-CoV-2 public health emergency.  Safety protocols were in place, including screening questions prior to the visit, additional usage of staff PPE, and extensive cleaning of exam room while  observing appropriate contact time as indicated for disinfecting solutions.   Assessment & Plan:

## 2019-07-27 NOTE — Patient Instructions (Signed)

## 2019-07-27 NOTE — Assessment & Plan Note (Signed)
Taking crestor and vascepa and most recent lipid was improved. His alcohol intake is still likely affecting readings but stable overall.

## 2019-08-07 ENCOUNTER — Other Ambulatory Visit: Payer: Self-pay | Admitting: Internal Medicine

## 2019-08-30 ENCOUNTER — Other Ambulatory Visit: Payer: Self-pay | Admitting: Internal Medicine

## 2019-10-09 ENCOUNTER — Ambulatory Visit: Payer: Federal, State, Local not specified - PPO | Admitting: Internal Medicine

## 2019-10-09 ENCOUNTER — Encounter: Payer: Self-pay | Admitting: Internal Medicine

## 2019-10-09 ENCOUNTER — Other Ambulatory Visit: Payer: Self-pay

## 2019-10-09 ENCOUNTER — Ambulatory Visit (INDEPENDENT_AMBULATORY_CARE_PROVIDER_SITE_OTHER): Payer: Federal, State, Local not specified - PPO

## 2019-10-09 VITALS — BP 132/66 | HR 61 | Temp 97.5°F | Ht 71.0 in | Wt 242.0 lb

## 2019-10-09 DIAGNOSIS — J449 Chronic obstructive pulmonary disease, unspecified: Secondary | ICD-10-CM | POA: Diagnosis not present

## 2019-10-09 DIAGNOSIS — G4733 Obstructive sleep apnea (adult) (pediatric): Secondary | ICD-10-CM

## 2019-10-09 DIAGNOSIS — Z720 Tobacco use: Secondary | ICD-10-CM

## 2019-10-09 DIAGNOSIS — R0600 Dyspnea, unspecified: Secondary | ICD-10-CM

## 2019-10-09 DIAGNOSIS — J8410 Pulmonary fibrosis, unspecified: Secondary | ICD-10-CM | POA: Diagnosis not present

## 2019-10-09 DIAGNOSIS — R0609 Other forms of dyspnea: Secondary | ICD-10-CM

## 2019-10-09 MED ORDER — STIOLTO RESPIMAT 2.5-2.5 MCG/ACT IN AERS
2.0000 | INHALATION_SPRAY | Freq: Every day | RESPIRATORY_TRACT | 0 refills | Status: DC
Start: 2019-10-09 — End: 2020-09-22

## 2019-10-09 MED ORDER — ALBUTEROL SULFATE HFA 108 (90 BASE) MCG/ACT IN AERS
2.0000 | INHALATION_SPRAY | Freq: Four times a day (QID) | RESPIRATORY_TRACT | 12 refills | Status: DC | PRN
Start: 2019-10-09 — End: 2021-09-20

## 2019-10-09 NOTE — Progress Notes (Signed)
HPI male smoker followed for OSA, COPD, Interstitial Fibrosis, tobacco use, complicated by obesity, PTSD/anxiety/depression, history syncope with RBBB/left hemiblock/ Dr Einar Gip  NPSG 11/12/1998- RDI/ AHI 75.8/hr. BiPAP 25/20 RDI 0/hr, tested in [place] PFT 06/14/11-within normal. FEV1 4.01/112%, FEV1/FVC 0.71, slight response to bronchodilator in small airways,  DLCO 87% Office Spirometry 06/24/17-WNL-FVC 4.74/95%, FEV1 3.21/85%, ratio 0.68, FEF 25-75% 2.09/67% -------------------------------------------------------------------.  10/09/2018- 64 year old male smoker(now Vaping) followed for OSA, COPD, Interstitial Fibrosis, tobacco use, complicated by obesity, PTSD/anxiety/depression, history syncope with RBBB/left hemiblock/ Dr Einar Gip      CPAP auto 5-15/[employer] Download compliance 100%, AHI 0.6/ hr -----OSA on CPAP Auto 5-15, DME: [employer]; no complaints Body weight today 241 lbs Comfortable using CPAP every night. Albuterol hfa not needed routinely. No exacerbations and no routine cough or wheeze. Does still vape against advice. Admits some DOE mowing yard.   10/09/19-  64 year old male smoker(now Vaping) followed for OSA, COPD, Interstitial Fibrosis, tobacco use, complicated by obesity, PTSD/anxiety/depression, history syncope with RBBB/left hemiblock/ Dr Einar Gip, DM2,     CPAP auto 5-15/[employer] Download compliance 100%, AHI 2.9/ hr Body weight today 242 lbs Had J[employer]J Covax Wife says he "gasps a lot" when up in the day.with exertion such as cutting grass. Denies wheeze or cough Has a rescue inhaler he never uses.  ROS-see HPI + = positive Constitutional:   No-   weight loss, night sweats, fevers, chills, fatigue, lassitude. HEENT:   No-  headaches, difficulty swallowing, tooth/dental problems, sore throat,       No-  sneezing, itching, ear ache, +nasal congestion, post nasal drip,  CV:  No-   chest pain, orthopnea, PND, swelling in lower extremities, anasarca, dizziness, palpitations Resp: +    shortness of breath with exertion or at rest.                 productive cough,   non-productive cough,  No- coughing up of blood.              No-   change in color of mucus.  No- wheezing.   Skin: No-   rash or lesions. GI:  No-   heartburn, indigestion, abdominal pain, nausea, vomiting,  GU:  MS:  No-   joint pain or swelling.  + back pain. Neuro-    No acute issue Psych:  No- change in mood or affect. +depression or anxiety.  No memory loss.  OBJ- Physical Exam General- Alert, Oriented, Affect-appropriate, Distress- none acute, +obese Skin- rash-none, lesions- none, excoriation- none,  Lymphadenopathy- none Head- atraumatic            Eyes- Gross vision intact, PERRLA, conjunctivae and secretions clear            Ears- Hearing, canals-normal            Nose- +turbinate edema, no-Septal dev, mucus, polyps, erosion, perforation             Throat- Mallampati III , mucosa clear , drainage- none, tonsils- atrophic, + missing tooth Neck- flexible , trachea midline, no stridor , thyroid nl, carotid no bruit Chest - symmetrical excursion , unlabored           Heart/CV- RRR ,+ trace S murmur , no gallop  , no rub, nl s1 s2                           - JVD- none , edema- none, stasis changes- none, varices- none  Lung- + clear, unlabored, wheeze- none, cough- none , dullness-none, rub- none, crackles-none           Chest wall-  Abd- Br/ Gen/ Rectal- Not done, not indicated Extrem- cyanosis- none, clubbing, none, atrophy- none, strength- nl Neuro- grossly intact to observation

## 2019-10-09 NOTE — Patient Instructions (Signed)
Sample x 2 Stiolto 2.5     Inhale 2 puffs, once daily  Order- CXR     Dx Former smoker, Dyspnea on exertion  Order- schedule PFT    Dx Former smoker, Dyspnea on exertion  Please let us know when you get your flu shot

## 2019-10-13 ENCOUNTER — Other Ambulatory Visit: Payer: Self-pay | Admitting: Internal Medicine

## 2019-10-14 ENCOUNTER — Telehealth: Payer: Self-pay | Admitting: Internal Medicine

## 2019-10-15 NOTE — Telephone Encounter (Signed)
Called [employer] and spoke with Wells Guiles in regards to call received from pt's spouse.  Per Wells Guiles, insurance denied claim for medical documentation (denied due to medical records).  Stated to Wells Guiles that pt had a recent OV 10/09/19 at the office. She has requested for [place] to send that OV as well as pt's last three OVs to her fax number at [phone]. Pt's OVs have been faxed in Rebecca's attn at the given fax number.   Called pt's spouse [name] but unable to reach. Left her a detailed message on machine.  Nothing further needed.

## 2019-10-26 ENCOUNTER — Encounter: Payer: Self-pay | Admitting: Internal Medicine

## 2019-10-26 DIAGNOSIS — Z720 Tobacco use: Secondary | ICD-10-CM | POA: Insufficient documentation

## 2019-10-26 NOTE — Assessment & Plan Note (Signed)
He switched from cigarettes to vaping. Plan- pointed to increased DOE as additional reason to stop all recreational inhalant use.

## 2019-10-26 NOTE — Assessment & Plan Note (Signed)
More aware of DOE without acute event. Obesity and deconditioning likely contribute. Plan- PFT, CXR

## 2019-10-26 NOTE — Assessment & Plan Note (Signed)
To update CXR and consider HRCT

## 2019-10-26 NOTE — Assessment & Plan Note (Signed)
Benefits from CPAP with good compliance and control Plan- continue auto 5-15 

## 2019-11-05 ENCOUNTER — Other Ambulatory Visit: Payer: Self-pay

## 2019-11-05 ENCOUNTER — Ambulatory Visit (INDEPENDENT_AMBULATORY_CARE_PROVIDER_SITE_OTHER): Payer: Federal, State, Local not specified - PPO | Admitting: Internal Medicine

## 2019-11-05 DIAGNOSIS — R0600 Dyspnea, unspecified: Secondary | ICD-10-CM

## 2019-11-05 DIAGNOSIS — R0609 Other forms of dyspnea: Secondary | ICD-10-CM

## 2019-11-05 LAB — PULMONARY FUNCTION TEST
DL/VA % pred: 78 %
DL/VA: 3.28 ml/min/mmHg/L
DLCO cor % pred: 61 %
DLCO cor: 16.84 ml/min/mmHg
DLCO unc % pred: 61 %
DLCO unc: 16.84 ml/min/mmHg
FEF 25-75 Post: 1.86 L/sec
FEF 25-75 Pre: 2.64 L/sec
FEF2575-%Change-Post: -29 %
FEF2575-%Pred-Post: 64 %
FEF2575-%Pred-Pre: 91 %
FEV1-%Change-Post: -13 %
FEV1-%Pred-Post: 72 %
FEV1-%Pred-Pre: 83 %
FEV1-Post: 2.56 L
FEV1-Pre: 2.95 L
FEV1FVC-%Change-Post: -13 %
FEV1FVC-%Pred-Pre: 102 %
FEV6-%Change-Post: 1 %
FEV6-%Pred-Post: 84 %
FEV6-%Pred-Pre: 83 %
FEV6-Post: 3.8 L
FEV6-Pre: 3.76 L
FEV6FVC-%Change-Post: 0 %
FEV6FVC-%Pred-Post: 104 %
FEV6FVC-%Pred-Pre: 104 %
FVC-%Change-Post: 0 %
FVC-%Pred-Post: 81 %
FVC-%Pred-Pre: 80 %
FVC-Post: 3.85 L
FVC-Pre: 3.81 L
Post FEV1/FVC ratio: 67 %
Post FEV6/FVC ratio: 99 %
Pre FEV1/FVC ratio: 78 %
Pre FEV6/FVC Ratio: 99 %
RV % pred: 126 %
RV: 2.88 L
TLC % pred: 95 %
TLC: 6.71 L

## 2019-11-05 NOTE — Progress Notes (Signed)
Full PFT completed today ? ?

## 2019-11-06 ENCOUNTER — Telehealth: Payer: Self-pay | Admitting: Internal Medicine

## 2019-11-06 NOTE — Telephone Encounter (Signed)
PFT- showed mild obstruction to airflow and reduced ability to exchange oxygen and carbon dioxide. This can be managed over-time and we will discuss it more at next visit. There is nothing we need to do differently for now.

## 2019-11-06 NOTE — Telephone Encounter (Signed)
Patient requesting results of PFT, please advise.

## 2019-11-09 NOTE — Telephone Encounter (Signed)
Called and spoke with pt's wife [name] letting her know the results of pt's PFT and she verbalized understanding. Nothing further needed.

## 2019-11-26 ENCOUNTER — Ambulatory Visit (HOSPITAL_COMMUNITY)
Admission: AD | Admit: 2019-11-26 | Discharge: 2019-11-26 | Disposition: A | Discharge status: Home / Self Care | Payer: Federal, State, Local not specified - PPO | Attending: Psychiatry | Admitting: Psychiatry
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
## 2020-01-09 ENCOUNTER — Other Ambulatory Visit: Payer: Self-pay | Admitting: Internal Medicine

## 2020-01-11 ENCOUNTER — Telehealth: Payer: Self-pay | Admitting: Internal Medicine

## 2020-01-11 NOTE — Telephone Encounter (Signed)
Spoke with patient's wife. Informed her that Vascepa and Icosapent Ethyl are the same medication. She verbalized understanding and reports the fill in pharmacist told them that it's just a generic OTC fish oil. She is going to follow up with their regular pharmacist today to ensure it is the same medication and dosage he has been taking.

## 2020-01-11 NOTE — Telephone Encounter (Signed)
Pt c/o medication issue:  1. Name of Medication: Icosapent Ethyl  2. How are you currently taking this medication (dosage and times per day)? Patient takes Vascepa as directly   3. Are you having a reaction (difficulty breathing--STAT)? No   4. What is your medication issue? [name] states the patient is concerned because whey went to the pharmacy to pick up medication, but the pharmacy had an order for the generic name, Icosapent Ethyl, available. She would like a call back to ensure that this is the same medication. Please confirm that this is the correct medication for the patient to take.

## 2020-02-20 DIAGNOSIS — C801 Malignant (primary) neoplasm, unspecified: Secondary | ICD-10-CM

## 2020-02-20 HISTORY — DX: Malignant (primary) neoplasm, unspecified: C80.1

## 2020-02-23 ENCOUNTER — Other Ambulatory Visit: Payer: Self-pay | Admitting: Internal Medicine

## 2020-02-23 DIAGNOSIS — E781 Pure hyperglyceridemia: Secondary | ICD-10-CM

## 2020-03-22 LAB — CBC AND DIFFERENTIAL
HCT: 44 (ref 41–53)
Hemoglobin: 15.2 (ref 13.5–17.5)
Platelets: 144 — AB (ref 150–399)
WBC: 6.3

## 2020-03-22 LAB — BASIC METABOLIC PANEL
BUN: 11 (ref 4–21)
CO2: 26 — AB (ref 13–22)
Chloride: 103 (ref 99–108)
Creatinine: 0.8 (ref 0.6–1.3)
Glucose: 92
Potassium: 4.4 (ref 3.4–5.3)
Sodium: 142 (ref 137–147)

## 2020-03-22 LAB — COMPREHENSIVE METABOLIC PANEL
Albumin: 4.4 (ref 3.5–5.0)
Calcium: 9.2 (ref 8.7–10.7)
GFR calc Af Amer: 113
GFR calc non Af Amer: 98

## 2020-03-22 LAB — TSH: TSH: 1.09 (ref 0.41–5.90)

## 2020-03-22 LAB — CBC: RBC: 4.56 (ref 3.87–5.11)

## 2020-03-22 LAB — HEPATIC FUNCTION PANEL
ALT: 56 — AB (ref 10–40)
AST: 47 — AB (ref 14–40)
Alkaline Phosphatase: 135 — AB (ref 25–125)
Bilirubin, Direct: 0.13 (ref 0.01–0.4)
Bilirubin, Total: 0.3

## 2020-03-28 LAB — VITAMIN D 25 HYDROXY (VIT D DEFICIENCY, FRACTURES): Vit D, 25-Hydroxy: 50.7

## 2020-03-28 LAB — BASIC METABOLIC PANEL
BUN: 9 (ref 4–21)
CO2: 21 (ref 13–22)
Chloride: 101 (ref 99–108)
Creatinine: 0.9 (ref 0.6–1.3)
Glucose: 110
Potassium: 4.2 (ref 3.4–5.3)
Sodium: 140 (ref 137–147)

## 2020-03-28 LAB — COMPREHENSIVE METABOLIC PANEL
Albumin: 4.3 (ref 3.5–5.0)
Calcium: 9.5 (ref 8.7–10.7)
GFR calc Af Amer: 108
GFR calc non Af Amer: 93
Globulin: 2.6

## 2020-03-28 LAB — LIPID PANEL
Cholesterol: 103 (ref 0–200)
HDL: 41 (ref 35–70)
LDL Cholesterol: 33
Triglycerides: 175 — AB (ref 40–160)

## 2020-03-28 LAB — CBC AND DIFFERENTIAL
HCT: 45 (ref 41–53)
Hemoglobin: 15.3 (ref 13.5–17.5)
Neutrophils Absolute: 4.1
Platelets: 148 — AB (ref 150–399)
WBC: 6.8

## 2020-03-28 LAB — VITAMIN B12: Vitamin B-12: 1530

## 2020-03-28 LAB — TSH: TSH: 1.68 (ref 0.41–5.90)

## 2020-03-28 LAB — HEPATIC FUNCTION PANEL
ALT: 49 — AB (ref 10–40)
AST: 44 — AB (ref 14–40)
Alkaline Phosphatase: 148 — AB (ref 25–125)
Bilirubin, Total: 0.2

## 2020-03-28 LAB — CBC: RBC: 4.72 (ref 3.87–5.11)

## 2020-05-17 LAB — LIPID PANEL
Chol/HDL Ratio: 2.2 ratio (ref 0.0–5.0)
Cholesterol, Total: 99 mg/dL — ABNORMAL LOW (ref 100–199)
HDL: 45 mg/dL (ref >39)
LDL Chol Calc (NIH): 36 mg/dL (ref 0–99)
Triglycerides: 95 mg/dL (ref 0–149)
VLDL Cholesterol Cal: 18 mg/dL (ref 5–40)

## 2020-05-17 LAB — LDL CHOLESTEROL, DIRECT: LDL Direct: 32 mg/dL (ref 0–99)

## 2020-05-20 ENCOUNTER — Ambulatory Visit: Payer: Federal, State, Local not specified - PPO | Admitting: Internal Medicine

## 2020-05-20 ENCOUNTER — Encounter: Payer: Self-pay | Admitting: Internal Medicine

## 2020-05-20 ENCOUNTER — Other Ambulatory Visit: Payer: Self-pay

## 2020-05-20 VITALS — BP 114/69 | HR 70 | Ht 71.0 in | Wt 190.0 lb

## 2020-05-20 DIAGNOSIS — E1165 Type 2 diabetes mellitus with hyperglycemia: Secondary | ICD-10-CM

## 2020-05-20 DIAGNOSIS — E781 Pure hyperglyceridemia: Secondary | ICD-10-CM | POA: Diagnosis not present

## 2020-05-20 DIAGNOSIS — I6523 Occlusion and stenosis of bilateral carotid arteries: Secondary | ICD-10-CM

## 2020-05-20 NOTE — Progress Notes (Signed)
LIPID CLINIC CONSULT NOTE  Chief Complaint:  Manage dyslipidemia  Primary Care Physician: Hoyt Koch, MD  Primary Cardiologist:  No primary care provider on file.  HPI:  [name] [name] is a 65 y.o. male who is being seen today for the evaluation of dyslipidemia at the request of Hoyt Koch, *.  This is a pleasant 65 year old male with a history of dyslipidemia, COPD, bilateral carotid artery stenosis, obstructive sleep apnea and type 2 diabetes specifically elevated triglycerides.  He is referred by Dr. Sharlet Salina for evaluation and management of this.  He reports family history of heart disease and diabetes in his father however much later in life and he died at age 66.  Currently is on rosuvastatin 20mg  and over-the-counter fish oil.  His most recent lipid profile on December 18, 2018 showed total cholesterol 107, triglycerides 770, HDL 31.  A direct LDL was performed and resulted at 20 mg/dL.  The total cholesterol had reduced significantly from 5 months ago where it was over 3000.  He is made significant dietary changes and has had improvement in glycemic control.  His hemoglobin A1c 5 months ago was also 10.9 and currently at 7.0.  There is some history of alcohol use in the past which has improved somewhat recently and may be contributing to his triglycerides.  04/23/2019  [name] [name] returns today for follow-up.  Overall he seems to be doing very well and managing his lipids.  His triglycerides have come down significantly.  Initially they were over 3000, then decreased to 770 and now 364, with total cholesterol 95, HDL 31 and LDL of 14.  At the direct LDL was measured at 27.  He reports no difficulty on Vascepa.  He is also decrease some of his alcohol use.  05/20/2020  [name] [name] returns today for follow-up.  He has had an impressive weight loss from 242 pounds down to 190 pounds today.  He says he is actually lost more than that.  He has made significant dietary  changes including cutting out sugars mostly in his diet.  This is also resulted in marked improvement in his dyslipidemia.  Total cholesterol is now 99, triglycerides 95, HDL 45 and LDL 36.  He remains on Vascepa 2 g twice daily and rosuvastatin 20 mg daily.  PMHx:  Past Medical History:  Diagnosis Date  . Anxiety   . COPD (chronic obstructive pulmonary disease) ([place])   . Depression    " post traumatic stress disorder" sees MD in [place] every 3-6 months.  . Dysrhythmia    hx. Bundle branch block- saw Dr. Kathlee Nations  . Irregular heart rate   . Sleep apnea    cpap used with nose clip- Dr. Baird Lyons follows  . Substance abuse ([place])    "tends to self medicate(Rx. meds or Alcohol) trying to get relief from "PSTD"    Past Surgical History:  Procedure Laterality Date  . COLONOSCOPY WITH PROPOFOL N/A 01/19/2014   Procedure: COLONOSCOPY WITH PROPOFOL;  Surgeon: Juanita Craver, MD;  Location: [facility];  Service: Endoscopy;  Laterality: N/A;    FAMHx:  Family History  Problem Relation Age of Onset  . Diabetes Father   . Heart Problems Father   . Heart disease Father   . Mental illness Father     SOCHx:   reports that he has quit smoking. His smoking use included cigarettes. He started smoking about 2 years ago. He has a 45.00 pack-year smoking history. He has  never used smokeless tobacco. He reports current alcohol use of about 42.0 standard drinks of alcohol per week. He reports that he does not use drugs.  ALLERGIES:  No Known Allergies  ROS: Pertinent items noted in HPI and remainder of comprehensive ROS otherwise negative.  HOME MEDS: Current Outpatient Medications on File Prior to Visit  Medication Sig Dispense Refill  . albuterol (VENTOLIN HFA) 108 (90 Base) MCG/ACT inhaler Inhale 2 puffs into the lungs every 6 (six) hours as needed for wheezing or shortness of breath. 8 g 12  . Ascorbic Acid (VITAMIN C) 1000 MG tablet Take 1,000 mg by mouth daily.    Marland Kitchen  aspirin 325 MG tablet Take 325 mg by mouth daily.    . Cholecalciferol (VITAMIN D) 2000 UNITS tablet Take 2,000 Units by mouth daily.    . clonazePAM (KLONOPIN) 0.5 MG tablet TAKE 1 TABLET BY MOUTH TWICE DAILY AS NEEDED 60 tablet 5  . metFORMIN (GLUCOPHAGE) 1000 MG tablet TAKE 1 TABLET(1000 MG) BY MOUTH TWICE DAILY WITH A MEAL 180 tablet 3  . Naltrexone (VIVITROL) 380 MG SUSR     . PARoxetine (PAXIL) 10 MG tablet     . rosuvastatin (CRESTOR) 20 MG tablet TAKE 1 TABLET BY MOUTH EVERY DAY 90 tablet 3  . tiaGABine (GABITRIL) 4 MG tablet Take 4 mg by mouth at bedtime.    . Tiotropium Bromide-Olodaterol (STIOLTO RESPIMAT) 2.5-2.5 MCG/ACT AERS Inhale 2 puffs into the lungs daily. 4 g 0  . VASCEPA 1 g capsule TAKE 2 CAPSULES(2 GRAMS) BY MOUTH TWICE DAILY 360 capsule 1  . vitamin B-12 (CYANOCOBALAMIN) 500 MCG tablet Take 500 mcg by mouth daily before supper.      No current facility-administered medications on file prior to visit.    LABS/IMAGING: No results found for this or any previous visit (from the past 48 hour(s)). No results found.  LIPID PANEL:    Component Value Date/Time   CHOL 99 (L) 05/17/2020 0817   TRIG 95 05/17/2020 0817   HDL 45 05/17/2020 0817   CHOLHDL 2.2 05/17/2020 0817   CHOLHDL 3 12/18/2018 0958   VLDL 66.0 (H) 01/31/2016 1628   LDLCALC 36 05/17/2020 0817   LDLDIRECT 32 05/17/2020 0821   LDLDIRECT 20.0 12/18/2018 0958    WEIGHTS: Wt Readings from Last 3 Encounters:  05/20/20 190 lb (86.2 kg)  10/09/19 242 lb (109.8 kg)  07/27/19 233 lb (105.7 kg)    VITALS: BP 114/69   Pulse 70   Ht 5\' 11"  (1.803 m)   Wt 190 lb (86.2 kg)   SpO2 96%   BMI 26.50 kg/m   EXAM: Deferred  EKG: Deferred  ASSESSMENT: 1. Mixed dyslipidemia with high triglycerides 2. Bilateral carotid artery stenosis 3. Type 2 diabetes-controlled (hemoglobin A1c 7.0) 4. COPD 5. PTSD  PLAN: 1.   Mr. Cottingham has had marked improvement in his lipid profile again mostly this time now  due to significant weight loss of 50 to 60 pounds.  At some point he may be able to reduce his rosuvastatin but I would recommend continuing his current dose now along with the Vascepa.  Overall this is a good medical regimen for him he seems to be tolerating well.  He does have some bilateral carotid artery disease and type 2 diabetes which has improved with the weight loss.  Follow-up with me annually or sooner as necessary.  Pixie Casino, MD, [facility] [place] Director of the Advanced Lipid Disorders &  Cardiovascular Risk Reduction Clinic Diplomate of the American Board of Clinical Lipidology Attending Cardiologist  Direct Dial: [phone]  Fax: [phone]  Website:  [email or web address]  Nadean Corwin Moriah Loughry 05/20/2020, 1:16 PM

## 2020-05-20 NOTE — Patient Instructions (Signed)
Medication Instructions:  Your physician recommends that you continue on your current medications as directed. Please refer to the Current Medication list given to you today.  *If you need a refill on your cardiac medications before your next appointment, please call your pharmacy*   Lab Work: Fasting lab work in 1 year to check cholesterol   If you have labs (blood work) drawn today and your tests are completely normal, you will receive your results only by: Marland Kitchen MyChart Message (if you have MyChart) OR . A paper copy in the mail If you have any lab test that is abnormal or we need to change your treatment, we will call you to review the results.   Testing/Procedures: NONE   Follow-Up: At [facility], you and your health needs are our priority.  As part of our continuing mission to provide you with exceptional heart care, we have created designated Provider Care Teams.  These Care Teams include your primary Cardiologist (physician) and Advanced Practice Providers (APPs -  Physician Assistants and Nurse Practitioners) who all work together to provide you with the care you need, when you need it.  We recommend signing up for the patient portal called "MyChart".  Sign up information is provided on this After Visit Summary.  MyChart is used to connect with patients for Virtual Visits (Telemedicine).  Patients are able to view lab/test results, encounter notes, upcoming appointments, etc.  Non-urgent messages can be sent to your provider as well.   To learn more about what you can do with MyChart, go to [email or web address].    Your next appointment:   12 month(s) - lipid clinic  The format for your next appointment:   In Person  Provider:   K. Mali Hilty, MD   Other Instructions

## 2020-07-28 ENCOUNTER — Ambulatory Visit (INDEPENDENT_AMBULATORY_CARE_PROVIDER_SITE_OTHER): Payer: Federal, State, Local not specified - PPO | Admitting: Internal Medicine

## 2020-07-28 ENCOUNTER — Other Ambulatory Visit: Payer: Self-pay

## 2020-07-28 ENCOUNTER — Encounter: Payer: Self-pay | Admitting: Internal Medicine

## 2020-07-28 VITALS — BP 118/60 | HR 72 | Temp 99.0°F | Resp 18 | Ht 71.0 in | Wt 184.4 lb

## 2020-07-28 DIAGNOSIS — Z720 Tobacco use: Secondary | ICD-10-CM

## 2020-07-28 DIAGNOSIS — R5383 Other fatigue: Secondary | ICD-10-CM | POA: Diagnosis not present

## 2020-07-28 DIAGNOSIS — J449 Chronic obstructive pulmonary disease, unspecified: Secondary | ICD-10-CM

## 2020-07-28 DIAGNOSIS — R3911 Hesitancy of micturition: Secondary | ICD-10-CM | POA: Insufficient documentation

## 2020-07-28 DIAGNOSIS — E785 Hyperlipidemia, unspecified: Secondary | ICD-10-CM

## 2020-07-28 DIAGNOSIS — F1021 Alcohol dependence, in remission: Secondary | ICD-10-CM | POA: Diagnosis not present

## 2020-07-28 DIAGNOSIS — E1169 Type 2 diabetes mellitus with other specified complication: Secondary | ICD-10-CM

## 2020-07-28 DIAGNOSIS — Z0000 Encounter for general adult medical examination without abnormal findings: Secondary | ICD-10-CM

## 2020-07-28 DIAGNOSIS — E118 Type 2 diabetes mellitus with unspecified complications: Secondary | ICD-10-CM

## 2020-07-28 LAB — COMPREHENSIVE METABOLIC PANEL
ALT: 19 U/L (ref 0–53)
AST: 25 U/L (ref 0–37)
Albumin: 3.9 g/dL (ref 3.5–5.2)
Alkaline Phosphatase: 64 U/L (ref 39–117)
BUN: 18 mg/dL (ref 6–23)
CO2: 30 mEq/L (ref 19–32)
Calcium: 8.8 mg/dL (ref 8.4–10.5)
Chloride: 103 mEq/L (ref 96–112)
Creatinine, Ser: 0.64 mg/dL (ref 0.40–1.50)
GFR: 101.26 mL/min (ref >60.00)
Glucose, Bld: 87 mg/dL (ref 70–99)
Potassium: 3.9 mEq/L (ref 3.5–5.1)
Sodium: 138 mEq/L (ref 135–145)
Total Bilirubin: 0.4 mg/dL (ref 0.2–1.2)
Total Protein: 6.4 g/dL (ref 6.0–8.3)

## 2020-07-28 LAB — CBC
HCT: 42.5 % (ref 39.0–52.0)
Hemoglobin: 14.8 g/dL (ref 13.0–17.0)
MCHC: 34.7 g/dL (ref 30.0–36.0)
MCV: 98.7 fl (ref 78.0–100.0)
Platelets: 169 10*3/uL (ref 150.0–400.0)
RBC: 4.3 Mil/uL (ref 4.22–5.81)
RDW: 13.9 % (ref 11.5–15.5)
WBC: 6.8 10*3/uL (ref 4.0–10.5)

## 2020-07-28 LAB — TESTOSTERONE TOTAL,FREE,BIO, MALES
Albumin: 3.8 g/dL (ref 3.6–5.1)
Sex Hormone Binding: 59 nmol/L (ref 22–77)
Testosterone: 176 ng/dL — ABNORMAL LOW (ref 250–827)

## 2020-07-28 LAB — MICROALBUMIN / CREATININE URINE RATIO
Creatinine,U: 142.3 mg/dL
Microalb Creat Ratio: 0.8 mg/g (ref 0.0–30.0)
Microalb, Ur: 1.1 mg/dL (ref 0.0–1.9)

## 2020-07-28 LAB — HEMOGLOBIN A1C: Hgb A1c MFr Bld: 5.3 % (ref 4.6–6.5)

## 2020-07-28 MED ORDER — ROSUVASTATIN CALCIUM 5 MG PO TABS: 5.0000 mg | ORAL_TABLET | Freq: Every day | ORAL | 3 refills | Status: DC

## 2020-07-28 NOTE — Assessment & Plan Note (Signed)
Checking CBC and testosterone levels.

## 2020-07-28 NOTE — Assessment & Plan Note (Signed)
Still smoking at this time and is smoking more. He will work on reducing which he has done successfully in the past. Reminded about risk and harm from smoking.

## 2020-07-28 NOTE — Patient Instructions (Addendum)
We have sent in the low dose cholesterol medicine to start taking again.  Work on stopping smoking.

## 2020-07-28 NOTE — Assessment & Plan Note (Signed)
Breathing doing well with albuterol prn and not using often. Still smoking and reminded that this will worsen his COPD over time.

## 2020-07-28 NOTE — Assessment & Plan Note (Signed)
Sober for about 8 months now and has made healthy changes. Unfortunately the rehab facility did not allow vaping so he started smoking again.

## 2020-07-28 NOTE — Assessment & Plan Note (Signed)
Checking HgA1c and microalbumin to creatinine ratio. Lipid panel at goal but he did stop crestor. Resume crestor. Depending on HgA1c will plan to reduce metformin dosing as able. He has stopped alcohol and made significant changes with diet and lost about 80 pounds.

## 2020-07-28 NOTE — Assessment & Plan Note (Signed)
Flu shot yearly. Covid-19 up to date. Shingrix complete. Tetanus up to date. Colonoscopy up to date. Counseled about sun safety and mole surveillance. Counseled about the dangers of distracted driving. Given 10 year screening recommendations.   

## 2020-07-28 NOTE — Assessment & Plan Note (Signed)
Referral to urology per patient preference.

## 2020-07-28 NOTE — Progress Notes (Signed)
   Subjective:   Patient ID: [name] [name], male    DOB: 04-23-1955, 65 y.o.   MRN: [id number]  HPI The patient is a 65 YO man coming in for physical.   Also having some concerns about urinary hesitancy (would like evaluation of this, also having some times where it is difficult to urinate, denies pain or burning) and fatigue (worried about his testosterone levels).  PMH, [facility], social history reviewed and updated  Review of Systems  Constitutional: Negative.   HENT: Negative.    Eyes: Negative.   Respiratory:  Negative for cough, chest tightness and shortness of breath.   Cardiovascular:  Negative for chest pain, palpitations and leg swelling.  Gastrointestinal:  Negative for abdominal distention, abdominal pain, constipation, diarrhea, nausea and vomiting.  Genitourinary:  Positive for difficulty urinating.  Musculoskeletal: Negative.   Skin: Negative.   Neurological: Negative.   Psychiatric/Behavioral: Negative.     Objective:  Physical Exam Constitutional:      Appearance: He is well-developed.  HENT:     Head: Normocephalic and atraumatic.  Cardiovascular:     Rate and Rhythm: Normal rate and regular rhythm.  Pulmonary:     Effort: Pulmonary effort is normal. No respiratory distress.     Breath sounds: Normal breath sounds. No wheezing or rales.  Abdominal:     General: Bowel sounds are normal. There is no distension.     Palpations: Abdomen is soft.     Tenderness: There is no abdominal tenderness. There is no rebound.  Musculoskeletal:     Cervical back: Normal range of motion.  Skin:    General: Skin is warm and dry.     Comments: Foot exam done  Neurological:     Mental Status: He is alert and oriented to person, place, and time.     Coordination: Coordination normal.    Vitals:   07/28/20 0834  BP: 118/60  Pulse: 72  Resp: 18  Temp: 99 F (37.2 C)  TempSrc: Oral  SpO2: 96%  Weight: 184 lb 6.4 oz (83.6 kg)  Height: 5\' 11"  (1.803 m)    This visit  occurred during the SARS-CoV-2 public health emergency.  Safety protocols were in place, including screening questions prior to the visit, additional usage of staff PPE, and extensive cleaning of exam room while observing appropriate contact time as indicated for disinfecting solutions.   Assessment & Plan:

## 2020-07-28 NOTE — Assessment & Plan Note (Signed)
He did stop crestor 20 mg daily for unknown reason (medication confusion) and is only taking vascepa. Will resume crestor at 5 mg daily since last lipid panel was at goal.

## 2020-07-29 ENCOUNTER — Encounter: Payer: Federal, State, Local not specified - PPO | Admitting: Internal Medicine

## 2020-08-01 NOTE — Addendum Note (Signed)
Addended by: Meredeth Ide on: 08/01/2020 11:26 AM   Modules accepted: Orders

## 2020-08-02 ENCOUNTER — Other Ambulatory Visit: Payer: Self-pay

## 2020-08-02 ENCOUNTER — Other Ambulatory Visit: Payer: Federal, State, Local not specified - PPO

## 2020-08-02 DIAGNOSIS — R5383 Other fatigue: Secondary | ICD-10-CM

## 2020-08-02 LAB — TESTOSTERONE TOTAL,FREE,BIO, MALES
Albumin: 3.8 g/dL (ref 3.6–5.1)
Sex Hormone Binding: 62 nmol/L (ref 22–77)
Testosterone: 209 ng/dL — ABNORMAL LOW (ref 250–827)

## 2020-08-09 ENCOUNTER — Other Ambulatory Visit: Payer: Self-pay | Admitting: Internal Medicine

## 2020-09-22 ENCOUNTER — Emergency Department (HOSPITAL_BASED_OUTPATIENT_CLINIC_OR_DEPARTMENT_OTHER): Payer: Medicare Other

## 2020-09-22 ENCOUNTER — Inpatient Hospital Stay (HOSPITAL_COMMUNITY): Payer: Medicare Other

## 2020-09-22 ENCOUNTER — Encounter (HOSPITAL_BASED_OUTPATIENT_CLINIC_OR_DEPARTMENT_OTHER): Payer: Self-pay | Admitting: *Deleted

## 2020-09-22 ENCOUNTER — Inpatient Hospital Stay (HOSPITAL_BASED_OUTPATIENT_CLINIC_OR_DEPARTMENT_OTHER)
Admission: EM | Admit: 2020-09-22 | Discharge: 2020-09-24 | DRG: 444 | Disposition: A | Discharge status: Home / Self Care | Payer: Medicare Other | Attending: Family Medicine | Admitting: Family Medicine

## 2020-09-22 ENCOUNTER — Encounter: Payer: Self-pay | Admitting: Family Medicine

## 2020-09-22 ENCOUNTER — Telehealth (INDEPENDENT_AMBULATORY_CARE_PROVIDER_SITE_OTHER): Payer: Federal, State, Local not specified - PPO | Admitting: Family Medicine

## 2020-09-22 ENCOUNTER — Other Ambulatory Visit: Payer: Self-pay

## 2020-09-22 VITALS — Wt 179.0 lb

## 2020-09-22 DIAGNOSIS — K831 Obstruction of bile duct: Secondary | ICD-10-CM | POA: Diagnosis present

## 2020-09-22 DIAGNOSIS — J449 Chronic obstructive pulmonary disease, unspecified: Secondary | ICD-10-CM | POA: Diagnosis present

## 2020-09-22 DIAGNOSIS — R10811 Right upper quadrant abdominal tenderness: Secondary | ICD-10-CM

## 2020-09-22 DIAGNOSIS — E1169 Type 2 diabetes mellitus with other specified complication: Secondary | ICD-10-CM | POA: Diagnosis present

## 2020-09-22 DIAGNOSIS — F1021 Alcohol dependence, in remission: Secondary | ICD-10-CM

## 2020-09-22 DIAGNOSIS — R52 Pain, unspecified: Secondary | ICD-10-CM

## 2020-09-22 DIAGNOSIS — E785 Hyperlipidemia, unspecified: Secondary | ICD-10-CM | POA: Diagnosis present

## 2020-09-22 DIAGNOSIS — U071 COVID-19: Secondary | ICD-10-CM | POA: Diagnosis present

## 2020-09-22 DIAGNOSIS — J8410 Pulmonary fibrosis, unspecified: Secondary | ICD-10-CM | POA: Diagnosis present

## 2020-09-22 DIAGNOSIS — F32A Depression, unspecified: Secondary | ICD-10-CM | POA: Diagnosis present

## 2020-09-22 DIAGNOSIS — Z8249 Family history of ischemic heart disease and other diseases of the circulatory system: Secondary | ICD-10-CM | POA: Diagnosis not present

## 2020-09-22 DIAGNOSIS — Z79899 Other long term (current) drug therapy: Secondary | ICD-10-CM | POA: Diagnosis not present

## 2020-09-22 DIAGNOSIS — R17 Unspecified jaundice: Secondary | ICD-10-CM | POA: Diagnosis not present

## 2020-09-22 DIAGNOSIS — F17210 Nicotine dependence, cigarettes, uncomplicated: Secondary | ICD-10-CM | POA: Diagnosis present

## 2020-09-22 DIAGNOSIS — G4733 Obstructive sleep apnea (adult) (pediatric): Secondary | ICD-10-CM | POA: Diagnosis present

## 2020-09-22 DIAGNOSIS — K8050 Calculus of bile duct without cholangitis or cholecystitis without obstruction: Secondary | ICD-10-CM

## 2020-09-22 DIAGNOSIS — Z833 Family history of diabetes mellitus: Secondary | ICD-10-CM

## 2020-09-22 DIAGNOSIS — Z7984 Long term (current) use of oral hypoglycemic drugs: Secondary | ICD-10-CM

## 2020-09-22 DIAGNOSIS — R519 Headache, unspecified: Secondary | ICD-10-CM

## 2020-09-22 DIAGNOSIS — Z7982 Long term (current) use of aspirin: Secondary | ICD-10-CM

## 2020-09-22 DIAGNOSIS — E118 Type 2 diabetes mellitus with unspecified complications: Secondary | ICD-10-CM | POA: Diagnosis present

## 2020-09-22 HISTORY — DX: Type 2 diabetes mellitus without complications: E11.9

## 2020-09-22 HISTORY — DX: Alcohol dependence, uncomplicated: F10.20

## 2020-09-22 LAB — CBC
HCT: 38.8 % — ABNORMAL LOW (ref 39.0–52.0)
Hemoglobin: 13.7 g/dL (ref 13.0–17.0)
MCH: 34 pg (ref 26.0–34.0)
MCHC: 35.3 g/dL (ref 30.0–36.0)
MCV: 96.3 fL (ref 80.0–100.0)
Platelets: 219 10*3/uL (ref 150–400)
RBC: 4.03 MIL/uL — ABNORMAL LOW (ref 4.22–5.81)
RDW: 17.4 % — ABNORMAL HIGH (ref 11.5–15.5)
WBC: 9.4 10*3/uL (ref 4.0–10.5)
nRBC: 0 % (ref 0.0–0.2)

## 2020-09-22 LAB — URINALYSIS, ROUTINE W REFLEX MICROSCOPIC
Glucose, UA: 100 mg/dL — AB
Hgb urine dipstick: NEGATIVE
Ketones, ur: NEGATIVE mg/dL
Leukocytes,Ua: NEGATIVE
Nitrite: NEGATIVE
Protein, ur: NEGATIVE mg/dL
Specific Gravity, Urine: 1.025 (ref 1.005–1.030)
pH: 6.5 (ref 5.0–8.0)

## 2020-09-22 LAB — PROTIME-INR
INR: 0.9 (ref 0.8–1.2)
Prothrombin Time: 12 seconds (ref 11.4–15.2)

## 2020-09-22 LAB — COMPREHENSIVE METABOLIC PANEL
ALT: 148 U/L — ABNORMAL HIGH (ref 0–44)
AST: 110 U/L — ABNORMAL HIGH (ref 15–41)
Albumin: 3.2 g/dL — ABNORMAL LOW (ref 3.5–5.0)
Alkaline Phosphatase: 475 U/L — ABNORMAL HIGH (ref 38–126)
Anion gap: 9 (ref 5–15)
BUN: 13 mg/dL (ref 8–23)
CO2: 28 mmol/L (ref 22–32)
Calcium: 8.6 mg/dL — ABNORMAL LOW (ref 8.9–10.3)
Chloride: 98 mmol/L (ref 98–111)
Creatinine, Ser: 0.71 mg/dL (ref 0.61–1.24)
GFR, Estimated: >60 mL/min (ref >60)
Glucose, Bld: 109 mg/dL — ABNORMAL HIGH (ref 70–99)
Potassium: 3.8 mmol/L (ref 3.5–5.1)
Sodium: 135 mmol/L (ref 135–145)
Total Bilirubin: 15.3 mg/dL — ABNORMAL HIGH (ref 0.3–1.2)
Total Protein: 6.3 g/dL — ABNORMAL LOW (ref 6.5–8.1)

## 2020-09-22 LAB — RESP PANEL BY RT-PCR (FLU A&B, COVID) ARPGX2
Influenza A by PCR: NEGATIVE
Influenza B by PCR: NEGATIVE
SARS Coronavirus 2 by RT PCR: POSITIVE — AB

## 2020-09-22 LAB — GLUCOSE, CAPILLARY: Glucose-Capillary: 85 mg/dL (ref 70–99)

## 2020-09-22 LAB — LIPASE, BLOOD: Lipase: 19 U/L (ref 11–51)

## 2020-09-22 LAB — CK: Total CK: 63 U/L (ref 49–397)

## 2020-09-22 LAB — ACETAMINOPHEN LEVEL: Acetaminophen (Tylenol), Serum: <10 ug/mL — ABNORMAL LOW (ref 10–30)

## 2020-09-22 MED ORDER — IOHEXOL 300 MG/ML  SOLN
100.0000 mL | Freq: Once | INTRAMUSCULAR | Status: AC | PRN
  Administered 2020-09-22: 100 mL via INTRAVENOUS

## 2020-09-22 MED ORDER — SODIUM CHLORIDE 0.9 % IV BOLUS
500.0000 mL | Freq: Once | INTRAVENOUS | Status: AC
  Administered 2020-09-22: 500 mL via INTRAVENOUS

## 2020-09-22 NOTE — Plan of Care (Signed)
  Problem: Education: Goal: Knowledge of risk factors and measures for prevention of condition will improve Outcome: Progressing   Problem: Respiratory: Goal: Will maintain a patent airway Outcome: Progressing Goal: Complications related to the disease process, condition or treatment will be avoided or minimized Outcome: Progressing   Problem: Health Behavior/Discharge Planning: Goal: Ability to manage health-related needs will improve Outcome: Progressing   Problem: Clinical Measurements: Goal: Ability to maintain clinical measurements within normal limits will improve Outcome: Progressing Goal: Diagnostic test results will improve Outcome: Progressing   Problem: Activity: Goal: Risk for activity intolerance will decrease Outcome: Progressing   Problem: Pain Managment: Goal: General experience of comfort will improve Outcome: Progressing   Problem: Safety: Goal: Ability to remain free from injury will improve Outcome: Progressing   Problem: Skin Integrity: Goal: Risk for impaired skin integrity will decrease Outcome: Progressing

## 2020-09-22 NOTE — ED Notes (Signed)
Pt states he has been giving plasma twice a week for many months , today he states he came and then started to feel bad , weak and had dark urine
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
## 2020-09-22 NOTE — Progress Notes (Signed)
Virtual Visit via Video Note  I connected with [name] [name]  on 09/22/20 at  3:40 PM EDT by a video enabled telemedicine application and verified that I am speaking with the correct person using two identifiers.  Location patient: home, [place] Location provider:work or home office Persons participating in the virtual visit: patient, provider, wife is with him today  I discussed the limitations of evaluation and management by telemedicine and the availability of in person appointments. The patient expressed understanding and agreed to proceed.   HPI:  Acute telemedicine visit for : -Onset: maybe 8 days ago, but worsening -Symptoms reported include:felt dizzy, vomiting, "couldn't walk straight," diarrhea, emesis initially and thought had stomach bug but now with blurred vision, RUQ tenderness, wife and pt feel today that eyes and skin have turned yellow , headache, malaise -stools seem "greasy" reports was having diarrhea initially -urine has been very dark -other family members had "stomach bug", but they all got better -temp 99 today -Denies:resp symptoms, CP, SOB, cough -report has been drinking a lot of fluids (water and gatorade), able to eat -Pertinent past medical history:see below -had physical with PCP in June and had normal LFTs at that visit -reports has has been clean for 9 months - denies alcohol or drug use  ROS: See pertinent positives and negatives per HPI.  Past Medical History:  Diagnosis Date   Anxiety    COPD (chronic obstructive pulmonary disease) ([place])    Depression    " post traumatic stress disorder" sees MD in [place] every 3-6 months.   Dysrhythmia    hx. Bundle branch block- saw Dr. Kathlee Nations   Irregular heart rate    Sleep apnea    cpap used with nose clip- Dr. Baird Lyons follows   Substance abuse [facility])    "tends to self medicate(Rx. meds or Alcohol) trying to get relief from "PSTD"    Past Surgical History:  Procedure Laterality Date    COLONOSCOPY WITH PROPOFOL N/A 01/19/2014   Procedure: COLONOSCOPY WITH PROPOFOL;  Surgeon: Juanita Craver, MD;  Location: [facility];  Service: Endoscopy;  Laterality: N/A;     Current Outpatient Medications:    albuterol (VENTOLIN HFA) 108 (90 Base) MCG/ACT inhaler, Inhale 2 puffs into the lungs every 6 (six) hours as needed for wheezing or shortness of breath., Disp: 8 g, Rfl: 12   Ascorbic Acid (VITAMIN C) 1000 MG tablet, Take 1,000 mg by mouth daily., Disp: , Rfl:    aspirin 325 MG tablet, Take 325 mg by mouth daily., Disp: , Rfl:    busPIRone (BUSPAR) 5 MG tablet, Take 5 mg by mouth at bedtime., Disp: , Rfl:    Cholecalciferol (VITAMIN D) 2000 UNITS tablet, Take 2,000 Units by mouth daily., Disp: , Rfl:    CLENPIQ 10-3.5-12 MG-GM -GM/160ML SOLN, Take by mouth every 30 (thirty) days., Disp: , Rfl:    clonazePAM (KLONOPIN) 0.5 MG tablet, TAKE 1 TABLET BY MOUTH TWICE DAILY AS NEEDED, Disp: 60 tablet, Rfl: 5   cloNIDine (CATAPRES) 0.1 MG tablet, Take 0.1 mg by mouth 2 (two) times daily., Disp: , Rfl:    folic acid (FOLVITE) 1 MG tablet, Take 1 mg by mouth daily., Disp: , Rfl:    magnesium oxide (MAG-OX) 400 (240 Mg) MG tablet, Take 1 tablet by mouth daily., Disp: , Rfl:    metFORMIN (GLUCOPHAGE) 1000 MG tablet, TAKE 1 TABLET(1000 MG) BY MOUTH TWICE DAILY WITH A MEAL, Disp: 180 tablet, Rfl: 3   Naltrexone (VIVITROL) 380 MG SUSR, ,  Disp: , Rfl:    PARoxetine (PAXIL) 10 MG tablet, , Disp: , Rfl:    QUEtiapine (SEROQUEL) 50 MG tablet, Take 50 mg by mouth at bedtime., Disp: , Rfl:    rosuvastatin (CRESTOR) 5 MG tablet, Take 1 tablet (5 mg total) by mouth daily., Disp: 90 tablet, Rfl: 3   tiaGABine (GABITRIL) 4 MG tablet, Take 4 mg by mouth at bedtime., Disp: , Rfl:    VASCEPA 1 g capsule, TAKE 2 CAPSULES(2 GRAMS) BY MOUTH TWICE DAILY, Disp: 360 capsule, Rfl: 1   vitamin B-12 (CYANOCOBALAMIN) 500 MCG tablet, Take 500 mcg by mouth daily before supper. , Disp: , Rfl:   EXAM:  VITALS per patient if  applicable:  GENERAL: alert, oriented, appears well and in no acute distress  HEENT: atraumatic, conjunttiva with questionable jaundice - video quality is not clear and I am not sure if the lighting is causing this or if this is true jaundice, patient and wife report they feel his skin and eyes have looked yellow starting today, no obvious abnormalities on inspection of external nose and ears  NECK: normal movements of the head and neck  LUNGS: on inspection no signs of respiratory distress, breathing rate appears normal, no obvious gross SOB, gasping or wheezing  CV: no obvious cyanosis  MS: moves all visible extremities without noticeable abnormality  PSYCH/NEURO: pleasant and cooperative, no obvious depression or anxiety, speech and thought processing grossly intact  ASSESSMENT AND PLAN:  Discussed the following assessment and plan:  Yellow eyes  Yellow skin  Right upper quadrant abdominal tenderness, rebound tenderness presence not specified  Nonintractable headache, unspecified chronicity pattern, unspecified headache type  -we discussed possible serious and likely etiologies, options for evaluation and workup, limitations of telemedicine visit vs in person visit, treatment, treatment risks and precautions. Given the reported symptoms advised needs prompt in-person evaluation. Query hepatic complications of viral illness, hepatitis, acute hepatic  failure with hep/pan/or biliary disease vs other. He has a hx of substance/alcohol abuse, but reports has been clean for > 9 months. LFTs looks good at recent physical. Pt prefers to go to [facility] via private vehicle. Wife is going to drive him and they agree to go right away today.  I discussed the assessment and treatment plan with the patient. The patient was provided an opportunity to ask questions and all were answered. The patient agreed with the plan and demonstrated an understanding of the instructions.     Lucretia Kern, DO

## 2020-09-22 NOTE — Patient Instructions (Signed)
Seek inperson medical care right away today as we discussed. Please call 911 and seek emergency medical care if worsening severe or life threatening symptoms.

## 2020-09-22 NOTE — ED Triage Notes (Signed)
States 10 days ago he was working in his garage all day and got dehydrated. Here today with abdominal pain and dark urine.

## 2020-09-23 ENCOUNTER — Inpatient Hospital Stay (HOSPITAL_COMMUNITY): Payer: Medicare Other

## 2020-09-23 ENCOUNTER — Inpatient Hospital Stay (HOSPITAL_COMMUNITY): Payer: Medicare Other | Admitting: Certified Registered"

## 2020-09-23 ENCOUNTER — Encounter (HOSPITAL_COMMUNITY): Payer: Self-pay | Admitting: Family Medicine

## 2020-09-23 ENCOUNTER — Encounter (HOSPITAL_COMMUNITY)
Admission: EM | Disposition: A | Discharge status: Home / Self Care | Payer: Self-pay | Source: Home / Self Care | Attending: Family Medicine

## 2020-09-23 DIAGNOSIS — K831 Obstruction of bile duct: Principal | ICD-10-CM

## 2020-09-23 HISTORY — PX: BILIARY BRUSHING: SHX6843

## 2020-09-23 HISTORY — PX: ERCP: SHX5425

## 2020-09-23 HISTORY — PX: SPHINCTEROTOMY: SHX5279

## 2020-09-23 HISTORY — PX: BILIARY STENT PLACEMENT: SHX5538

## 2020-09-23 LAB — COMPREHENSIVE METABOLIC PANEL
ALT: 128 U/L — ABNORMAL HIGH (ref 0–44)
AST: 85 U/L — ABNORMAL HIGH (ref 15–41)
Albumin: 2.8 g/dL — ABNORMAL LOW (ref 3.5–5.0)
Alkaline Phosphatase: 451 U/L — ABNORMAL HIGH (ref 38–126)
Anion gap: 7 (ref 5–15)
BUN: 14 mg/dL (ref 8–23)
CO2: 25 mmol/L (ref 22–32)
Calcium: 8.7 mg/dL — ABNORMAL LOW (ref 8.9–10.3)
Chloride: 103 mmol/L (ref 98–111)
Creatinine, Ser: <0.3 mg/dL — ABNORMAL LOW (ref 0.61–1.24)
Glucose, Bld: 87 mg/dL (ref 70–99)
Potassium: 3.5 mmol/L (ref 3.5–5.1)
Sodium: 135 mmol/L (ref 135–145)
Total Bilirubin: 14.6 mg/dL — ABNORMAL HIGH (ref 0.3–1.2)
Total Protein: 5.8 g/dL — ABNORMAL LOW (ref 6.5–8.1)

## 2020-09-23 LAB — HEPATITIS PANEL, ACUTE
HCV Ab: NONREACTIVE
Hep A IgM: NONREACTIVE
Hep B C IgM: NONREACTIVE
Hepatitis B Surface Ag: NONREACTIVE

## 2020-09-23 LAB — CBC
HCT: 37.4 % — ABNORMAL LOW (ref 39.0–52.0)
Hemoglobin: 13.1 g/dL (ref 13.0–17.0)
MCH: 34.1 pg — ABNORMAL HIGH (ref 26.0–34.0)
MCHC: 35 g/dL (ref 30.0–36.0)
MCV: 97.4 fL (ref 80.0–100.0)
Platelets: 191 10*3/uL (ref 150–400)
RBC: 3.84 MIL/uL — ABNORMAL LOW (ref 4.22–5.81)
RDW: 17.3 % — ABNORMAL HIGH (ref 11.5–15.5)
WBC: 8.4 10*3/uL (ref 4.0–10.5)
nRBC: 0 % (ref 0.0–0.2)

## 2020-09-23 LAB — GLUCOSE, CAPILLARY
Glucose-Capillary: 108 mg/dL — ABNORMAL HIGH (ref 70–99)
Glucose-Capillary: 122 mg/dL — ABNORMAL HIGH (ref 70–99)
Glucose-Capillary: 130 mg/dL — ABNORMAL HIGH (ref 70–99)

## 2020-09-23 LAB — HEMOGLOBIN A1C
Hgb A1c MFr Bld: 5 % (ref 4.8–5.6)
Mean Plasma Glucose: 96.8 mg/dL

## 2020-09-23 LAB — HIV ANTIBODY (ROUTINE TESTING W REFLEX): HIV Screen 4th Generation wRfx: NONREACTIVE

## 2020-09-23 LAB — PROTIME-INR
INR: 0.9 (ref 0.8–1.2)
Prothrombin Time: 12.4 seconds (ref 11.4–15.2)

## 2020-09-23 SURGERY — ERCP, WITH INTERVENTION IF INDICATED
Anesthesia: General

## 2020-09-23 MED ORDER — LACTATED RINGERS IV SOLN: INTRAVENOUS | Status: DC | PRN

## 2020-09-23 MED ORDER — PHENYLEPHRINE HCL-NACL 20-0.9 MG/250ML-% IV SOLN
INTRAVENOUS | Status: DC | PRN
  Administered 2020-09-23: 50 ug/min via INTRAVENOUS

## 2020-09-23 MED ORDER — MIDAZOLAM HCL 2 MG/2ML IJ SOLN
INTRAMUSCULAR | Status: AC
  Filled 2020-09-23: qty 2

## 2020-09-23 MED ORDER — QUETIAPINE FUMARATE 50 MG PO TABS
50.0000 mg | ORAL_TABLET | Freq: Every day | ORAL | Status: DC
  Administered 2020-09-23: 50 mg via ORAL
  Filled 2020-09-23: qty 1

## 2020-09-23 MED ORDER — INSULIN ASPART 100 UNIT/ML IJ SOLN
0.0000 [IU] | INTRAMUSCULAR | Status: DC
  Administered 2020-09-23 (×2): 1 [IU] via SUBCUTANEOUS
  Administered 2020-09-24: 3 [IU] via SUBCUTANEOUS

## 2020-09-23 MED ORDER — SODIUM CHLORIDE 0.9 % IV SOLN
INTRAVENOUS | Status: DC | PRN
  Administered 2020-09-23: 35 mL

## 2020-09-23 MED ORDER — PAROXETINE HCL 20 MG PO TABS
30.0000 mg | ORAL_TABLET | Freq: Every day | ORAL | Status: DC
  Administered 2020-09-23: 30 mg via ORAL
  Filled 2020-09-23: qty 1

## 2020-09-23 MED ORDER — FENTANYL CITRATE (PF) 100 MCG/2ML IJ SOLN
INTRAMUSCULAR | Status: DC | PRN
  Administered 2020-09-23: 50 ug via INTRAVENOUS

## 2020-09-23 MED ORDER — GLUCAGON HCL RDNA (DIAGNOSTIC) 1 MG IJ SOLR
INTRAMUSCULAR | Status: AC
  Filled 2020-09-23: qty 1

## 2020-09-23 MED ORDER — CIPROFLOXACIN IN D5W 400 MG/200ML IV SOLN
INTRAVENOUS | Status: AC
  Filled 2020-09-23: qty 200

## 2020-09-23 MED ORDER — FOLIC ACID 1 MG PO TABS
1.0000 mg | ORAL_TABLET | Freq: Every day | ORAL | Status: DC
  Administered 2020-09-23 – 2020-09-24 (×2): 1 mg via ORAL
  Filled 2020-09-23 (×2): qty 1

## 2020-09-23 MED ORDER — MORPHINE SULFATE (PF) 2 MG/ML IV SOLN: 1.0000 mg | INTRAVENOUS | Status: DC | PRN

## 2020-09-23 MED ORDER — MIDAZOLAM HCL 2 MG/2ML IJ SOLN
INTRAMUSCULAR | Status: DC | PRN
  Administered 2020-09-23: 2 mg via INTRAVENOUS

## 2020-09-23 MED ORDER — ASCORBIC ACID 500 MG PO TABS
1000.0000 mg | ORAL_TABLET | Freq: Every day | ORAL | Status: DC
  Administered 2020-09-23 – 2020-09-24 (×2): 1000 mg via ORAL
  Filled 2020-09-23 (×2): qty 2

## 2020-09-23 MED ORDER — VITAMIN D 25 MCG (1000 UNIT) PO TABS
2000.0000 [IU] | ORAL_TABLET | Freq: Every day | ORAL | Status: DC
  Administered 2020-09-23 – 2020-09-24 (×2): 2000 [IU] via ORAL
  Filled 2020-09-23 (×2): qty 2

## 2020-09-23 MED ORDER — ALBUTEROL SULFATE HFA 108 (90 BASE) MCG/ACT IN AERS: 2.0000 | INHALATION_SPRAY | Freq: Four times a day (QID) | RESPIRATORY_TRACT | Status: DC | PRN

## 2020-09-23 MED ORDER — SUCCINYLCHOLINE CHLORIDE 200 MG/10ML IV SOSY
PREFILLED_SYRINGE | INTRAVENOUS | Status: DC | PRN
  Administered 2020-09-23: 120 mg via INTRAVENOUS

## 2020-09-23 MED ORDER — ROCURONIUM BROMIDE 10 MG/ML (PF) SYRINGE
PREFILLED_SYRINGE | INTRAVENOUS | Status: DC | PRN
  Administered 2020-09-23: 30 mg via INTRAVENOUS

## 2020-09-23 MED ORDER — INDOMETHACIN 50 MG RE SUPP
RECTAL | Status: AC
  Filled 2020-09-23: qty 1

## 2020-09-23 MED ORDER — LIDOCAINE 2% (20 MG/ML) 5 ML SYRINGE
INTRAMUSCULAR | Status: DC | PRN
  Administered 2020-09-23: 60 mg via INTRAVENOUS

## 2020-09-23 MED ORDER — ONDANSETRON HCL 4 MG/2ML IJ SOLN: 4.0000 mg | Freq: Four times a day (QID) | INTRAMUSCULAR | Status: DC | PRN

## 2020-09-23 MED ORDER — INDOMETHACIN 50 MG RE SUPP
RECTAL | Status: DC | PRN
  Administered 2020-09-23: 50 mg via RECTAL

## 2020-09-23 MED ORDER — CIPROFLOXACIN IN D5W 400 MG/200ML IV SOLN
INTRAVENOUS | Status: DC | PRN
  Administered 2020-09-23: 400 mg via INTRAVENOUS

## 2020-09-23 MED ORDER — FENTANYL CITRATE (PF) 100 MCG/2ML IJ SOLN
INTRAMUSCULAR | Status: AC
  Filled 2020-09-23: qty 2

## 2020-09-23 MED ORDER — DEXAMETHASONE SODIUM PHOSPHATE 10 MG/ML IJ SOLN
INTRAMUSCULAR | Status: DC | PRN
  Administered 2020-09-23: 10 mg via INTRAVENOUS

## 2020-09-23 MED ORDER — ONDANSETRON HCL 4 MG/2ML IJ SOLN
INTRAMUSCULAR | Status: DC | PRN
  Administered 2020-09-23: 4 mg via INTRAVENOUS

## 2020-09-23 MED ORDER — SODIUM CHLORIDE 0.9 % IV SOLN: INTRAVENOUS | Status: DC

## 2020-09-23 MED ORDER — GADOBUTROL 1 MMOL/ML IV SOLN
8.0000 mL | Freq: Once | INTRAVENOUS | Status: AC | PRN
  Administered 2020-09-23: 8 mL via INTRAVENOUS

## 2020-09-23 MED ORDER — INDOMETHACIN 50 MG RE SUPP
50.0000 mg | Freq: Once | RECTAL | Status: DC
  Filled 2020-09-23: qty 1

## 2020-09-23 MED ORDER — BUSPIRONE HCL 5 MG PO TABS
5.0000 mg | ORAL_TABLET | Freq: Every day | ORAL | Status: DC
  Administered 2020-09-23: 5 mg via ORAL
  Filled 2020-09-23: qty 1

## 2020-09-23 MED ORDER — TIAGABINE HCL 4 MG PO TABS
8.0000 mg | ORAL_TABLET | Freq: Every day | ORAL | Status: DC
  Administered 2020-09-23: 8 mg via ORAL
  Filled 2020-09-23: qty 2

## 2020-09-23 MED ORDER — ONDANSETRON HCL 4 MG PO TABS: 4.0000 mg | ORAL_TABLET | Freq: Four times a day (QID) | ORAL | Status: DC | PRN

## 2020-09-23 MED ORDER — GLYCOPYRROLATE 0.2 MG/ML IJ SOLN
INTRAMUSCULAR | Status: DC | PRN
Start: 2020-09-23 — End: 2020-09-23
  Administered 2020-09-23: .2 mg via INTRAVENOUS

## 2020-09-23 MED ORDER — FENTANYL CITRATE (PF) 250 MCG/5ML IJ SOLN: INTRAMUSCULAR | Status: DC | PRN

## 2020-09-23 MED ORDER — SUGAMMADEX SODIUM 500 MG/5ML IV SOLN
INTRAVENOUS | Status: DC | PRN
  Administered 2020-09-23: 300 mg via INTRAVENOUS

## 2020-09-23 MED ORDER — EPHEDRINE SULFATE 50 MG/ML IJ SOLN
INTRAMUSCULAR | Status: DC | PRN
  Administered 2020-09-23: 5 mg via INTRAVENOUS

## 2020-09-23 MED ORDER — CLONAZEPAM 0.5 MG PO TABS: 0.5000 mg | ORAL_TABLET | Freq: Two times a day (BID) | ORAL | Status: DC | PRN

## 2020-09-23 MED ORDER — SENNOSIDES-DOCUSATE SODIUM 8.6-50 MG PO TABS: 1.0000 | ORAL_TABLET | Freq: Every evening | ORAL | Status: DC | PRN

## 2020-09-23 MED ORDER — PROPOFOL 10 MG/ML IV BOLUS
INTRAVENOUS | Status: DC | PRN
  Administered 2020-09-23: 150 mg via INTRAVENOUS
  Administered 2020-09-23: 50 mg via INTRAVENOUS

## 2020-09-23 NOTE — Op Note (Signed)
[facility] Patient Name: [name] [name] Procedure Date: 09/23/2020 MRN: [id number] Attending MD: Carol Ada , MD Date of Birth: 05/26/1955 CSN: [id number] Age: 65 Admit Type: Inpatient Procedure:                ERCP Indications:              Malignant stricture of the common bile duct Providers:                Carol Ada, MD, Janee Morn, Technician,                            Doristine Johns, RN, Glenis Smoker, CRNA Referring MD:              Medicines:                General Anesthesia Complications:            No immediate complications. Estimated Blood Loss:     Estimated blood loss: none. Procedure:                Pre-Anesthesia Assessment:                           - Prior to the procedure, a History and Physical                            was performed, and patient medications and                            allergies were reviewed. The patient's tolerance of                            previous anesthesia was also reviewed. The risks                            and benefits of the procedure and the sedation                            options and risks were discussed with the patient.                            All questions were answered, and informed consent                            was obtained. Prior Anticoagulants: The patient has                            taken no previous anticoagulant or antiplatelet                            agents. ASA Grade Assessment: II - A patient with                            mild systemic disease. After reviewing the risks  and benefits, the patient was deemed in                            satisfactory condition to undergo the procedure.                           - Sedation was administered by an anesthesia                            professional. General anesthesia was attained.                           After obtaining informed consent, the scope was                            passed  under direct vision. Throughout the                            procedure, the patient's blood pressure, pulse, and                            oxygen saturations were monitored continuously. The                            [employer] single use                            duodenoscope was introduced through the mouth, and                            used to inject contrast into and used to inject                            contrast into the bile duct. The ERCP was                            accomplished without difficulty. The patient                            tolerated the procedure well. Scope In: Scope Out: Findings:      The major papilla was normal. The bile duct was deeply cannulated with       the short-nosed traction sphincterotome. Contrast was injected. I       personally interpreted the bile duct images. There was brisk flow of       contrast through the ducts. Image quality was excellent. Contrast       extended to the hepatic ducts. The common bile duct contained a single       localized stenosis 10 mm in length. The common bile duct and left and       right hepatic ducts and all intrahepatic branches were moderately       dilated and diffusely dilated, with a mass causing an obstruction. A       short 0.035 inch Soft Jagwire was passed into the biliary tree. A 12 mm  biliary sphincterotomy was made with a monofilament traction (standard)       sphincterotome using ERBE electrocautery. There was no       post-sphincterotomy bleeding. Cells for cytology were obtained by       brushing in the upper third of the main bile duct. One 7 Fr by 7 cm       plastic biliary stent with a single external flap and a single internal       flap was placed 6.5 cm into the common bile duct. Bile flowed through       the stent. The stent was in good position.      The CBD was cannulated after 5 minutes of attempts. The PD was not       cannulated during this process. The  guidewire was secured in the left       intrahepatic ducts. Contrast injection revealed a 1-1.5 cm stricture in       the proximal CBD, correlating with the MRCP. Contrast proximal to this       point delineated the dilated proximal ducts. Brushings were obtained of       the stricture using two brushes. The biliary fluid was also collected       and some blood was elicited with the brushings. A 7 Fr x 7 cm plastic       biliary stent was inserted with mild difficulty at the stricture. Clear       bile was noted to be draining s/p insertion. Impression:               - The major papilla appeared normal.                           - A single localized biliary stricture was found in                            the common bile duct. The stricture was malignant                            appearing.                           - The left and right hepatic ducts and all                            intrahepatic branches and common bile duct were                            moderately dilated, with a mass causing an                            obstruction.                           - A biliary sphincterotomy was performed.                           - Cells for cytology obtained in the upper third of  the main bile duct.                           - One plastic biliary stent was placed into the                            common bile duct. Moderate Sedation:      Not Applicable - Patient had care per Anesthesia. Recommendation:           - Return patient to hospital ward for ongoing care.                           - If the patient is stable tomorrow he can be                            discharged home.                           - Follow up in the office in 1-2 weeks. Procedure Code(s):        --- Professional ---                           [id number], Endoscopic retrograde                            cholangiopancreatography (ERCP); with placement of                             endoscopic stent into biliary or pancreatic duct,                            including pre- and post-dilation and guide wire                            passage, when performed, including sphincterotomy,                            when performed, each stent                           [id number], Endoscopic catheterization of the biliary                            ductal system, radiological supervision and                            interpretation Diagnosis Code(s):        --- Professional ---                           K83.1, Obstruction of bile duct CPT copyright 2019 [employer]. All rights reserved. The codes documented in this report are preliminary and upon coder review may  be revised to meet current compliance requirements. Carol Ada, MD Carol Ada, MD 09/23/2020 5:26:35 PM This report has been signed electronically. Number of Addenda: 0

## 2020-09-23 NOTE — Anesthesia Postprocedure Evaluation (Signed)
Anesthesia Post Note  Patient: [name] [name]  Procedure(s) Performed: ENDOSCOPIC RETROGRADE CHOLANGIOPANCREATOGRAPHY (ERCP) BILIARY STENT PLACEMENT SPHINCTEROTOMY BILIARY BRUSHING     Patient location during evaluation: PACU Anesthesia Type: General Level of consciousness: awake and alert Pain management: pain level controlled Vital Signs Assessment: post-procedure vital signs reviewed and stable Respiratory status: spontaneous breathing, nonlabored ventilation, respiratory function stable and patient connected to nasal cannula oxygen Cardiovascular status: blood pressure returned to baseline and stable Postop Assessment: no apparent nausea or vomiting Anesthetic complications: no   No notable events documented.  Last Vitals:  Vitals:   09/23/20 1745 09/23/20 1752  BP: 132/83 125/86  Pulse: 71 74  Resp: 18 18  Temp:    SpO2: 98% 97%    Last Pain:  Vitals:   09/23/20 1734  TempSrc:   PainSc: 0-No pain                 Sherrilynn Gudgel

## 2020-09-23 NOTE — Progress Notes (Signed)
PROGRESS NOTE    [name] [name]  [id number] DOB: Feb 17, 1956 DOA: 09/22/2020 PCP: Hoyt Koch, MD   Brief Narrative:  HPI: [name] [name] is a 65 y.o. male with medical history significant for COPD with interstitial pulmonary fibrosis, type 2 diabetes, depression/anxiety, alcohol use disorder, and OSA on CPAP who presented to the ED for evaluation of abdominal pain.   Patient states that 1 week ago he developed sudden onset of severe lower abdominal pain.  He says he and his family developed "stomach bug" type symptoms.  He was experiencing some urgency for bowel movement but had scant loose stools.  He says his family's symptoms resolved after short period of time however his continued.  This morning he noticed that his skin and eyes appeared to be yellow.  He had a telemedicine visit with his primary care and was advised to present to the ED for further management.  Patient reports a history of alcohol use but has been sober since undergoing a detox program in October 2021.   ED Course:  Initial vitals showed BP 130/78, pulse 73, RR 18, temp 98.7 F, SPO2 94% on room air.   Labs significant for total bilirubin 15.3, AST 110, ALT 148, alkaline phosphatase 475, sodium 135, potassium 3.8, bicarb 28, BUN 13, creatinine 0.71, serum glucose 109, WBC 9.4, hemoglobin 13.7, platelets 219,000, CK 63.  Acetaminophen level <10, INR 0.9.  Acute hepatitis panel in process.   SARS-CoV-2 PCR is positive.  Influenza A/B PCR's are negative.   RUQ ultrasound showed intrahepatic biliary ductal dilatation and dilated proximal common bile duct consistent with biliary obstruction.   CT abdomen/pelvis with contrast again showed findings consistent with biliary obstruction possibly due to common bile duct mass.   Patient was given 500 cc normal saline.  EDP discussed with on-call GI who recommended admission and MRCP.  The hospitalist service was consulted to admit for further evaluation and  management.  Assessment & Plan:   Principal Problem:   Biliary obstruction Active Problems:   Obstructive sleep apnea   Hyperlipidemia associated with type 2 diabetes mellitus ([place])   COPD mixed type (HCC)   Alcohol dependence in remission ([place])   Diabetes mellitus type 2 with complications ([place])   Interstitial pulmonary fibrosis (HCC)   Biliary obstruction secondary to soft tissue mass in proximal CBD: Patient still complains of abdominal discomfort but he does not call it pain.  Bilirubin is stable.  GI on board.  Plan for ERCP today.  Appreciate GI help.   Positive COVID-19 test: Incidental positive finding.  Patient does report recent gastroenteritis symptoms between himself and his family which may have been COVID symptoms.  Otherwise asymptomatic at this time.  Will keep on precautions for now but no directed therapy at this point.  Might be a candidate for paxlovid but would like to treat his immediate issue first.   COPD/interstitial pulmonary fibrosis: Chronic and stable.  Continue albuterol as needed.   Type II diabetes: Takes metformin at home which I will continue to hold.  Placed on SSI and check hemoglobin A1c.   Depression/anxiety: Resume home medications.   Hyperlipidemia: Holding statin due to elevated LFTs.   Alcohol use disorder: In remission, reports he has been sober for 9 months.   OSA: Continue CPAP nightly.  DVT prophylaxis: SCDs Start: 09/23/20 0113   Code Status: Full Code  Family Communication:  None present at bedside.  Plan of care discussed with patient in length and he verbalized understanding and  agreed with it.  Status is: Inpatient  Remains inpatient appropriate because:Ongoing diagnostic testing needed not appropriate for outpatient work up  Dispo: The patient is from: Home              Anticipated d/c is to: Home              Patient currently is not medically stable to d/c.   Difficult to place patient No        Estimated body  mass index is 25.59 kg/m as calculated from the following:   Height as of this encounter: '5\' 10"'$  (1.778 m).   Weight as of this encounter: 80.9 kg.     Nutritional Assessment: Body mass index is 25.59 kg/m.Marland Kitchen Seen by dietician.  I agree with the assessment and plan as outlined below: Nutrition Status:     Consultants:  GI  Procedures:  None  Antimicrobials:  Anti-infectives (From admission, onward)    None          Subjective: Seen and examined.  Still complains of abdominal discomfort but very minimal.  No other complaint.  No shortness of breath.  Objective: Vitals:   09/22/20 2230 09/22/20 2312 09/23/20 0311 09/23/20 0701  BP: 120/74 131/86 134/89 (!) 139/93  Pulse: 65 (!) 57 (!) 57 60  Resp: '18 20 18 20  '$ Temp: 98.8 F (37.1 C) 98.4 F (36.9 C) 97.9 F (36.6 C) 97.8 F (36.6 C)  TempSrc: Oral Oral Oral Oral  SpO2: 96% 99% 99% 100%  Weight:  80.9 kg    Height:  '5\' 10"'$  (1.778 m)      Intake/Output Summary (Last 24 hours) at 09/23/2020 1021 Last data filed at 09/23/2020 0300 Gross per 24 hour  Intake 0 ml  Output --  Net 0 ml   Filed Weights   09/22/20 1726 09/22/20 2312  Weight: 81.2 kg 80.9 kg    Examination:  General exam: Appears calm and comfortable  Respiratory system: Clear to auscultation. Respiratory effort normal. Cardiovascular system: S1 & S2 heard, RRR. No JVD, murmurs, rubs, gallops or clicks. No pedal edema. Gastrointestinal system: Abdomen is only slightly distended, soft with mild epigastric tenderness.  No organomegaly or masses felt. Normal bowel sounds heard. Central nervous system: Alert and oriented. No focal neurological deficits. Extremities: Symmetric 5 x 5 power. Skin: No rashes, lesions or ulcers Psychiatry: Judgement and insight appear normal. Mood & affect appropriate.    Data Reviewed: I have personally reviewed following labs and imaging studies  CBC: Recent Labs  Lab 09/22/20 1733 09/23/20 0341  WBC 9.4 8.4   HGB 13.7 13.1  HCT 38.8* 37.4*  MCV 96.3 97.4  PLT 219 [id number]   Basic Metabolic Panel: Recent Labs  Lab 09/22/20 1733 09/23/20 0341  NA 135 135  K 3.8 3.5  CL 98 103  CO2 28 25  GLUCOSE 109* 87  BUN 13 14  CREATININE 0.71 <0.30*  CALCIUM 8.6* 8.7*   GFR: CrCl cannot be calculated (This lab value cannot be used to calculate CrCl because it is not a number: <0.30). Liver Function Tests: Recent Labs  Lab 09/22/20 1733 09/23/20 0341  AST 110* 85*  ALT 148* 128*  ALKPHOS 475* 451*  BILITOT 15.3* 14.6*  PROT 6.3* 5.8*  ALBUMIN 3.2* 2.8*   Recent Labs  Lab 09/22/20 1733  LIPASE 19   No results for input(s): AMMONIA in the last 168 hours. Coagulation Profile: Recent Labs  Lab 09/22/20 1825 09/23/20 0341  INR 0.9 0.9  Cardiac Enzymes: Recent Labs  Lab 09/22/20 1733  CKTOTAL 63   BNP (last 3 results) No results for input(s): PROBNP in the last 8760 hours. HbA1C: No results for input(s): HGBA1C in the last 72 hours. CBG: Recent Labs  Lab 09/22/20 2312  GLUCAP 85   Lipid Profile: No results for input(s): CHOL, HDL, LDLCALC, TRIG, CHOLHDL, LDLDIRECT in the last 72 hours. Thyroid Function Tests: No results for input(s): TSH, T4TOTAL, FREET4, T3FREE, THYROIDAB in the last 72 hours. Anemia Panel: No results for input(s): VITAMINB12, FOLATE, FERRITIN, TIBC, IRON, RETICCTPCT in the last 72 hours. Sepsis Labs: No results for input(s): PROCALCITON, LATICACIDVEN in the last 168 hours.  Recent Results (from the past 240 hour(s))  Resp Panel by RT-PCR (Flu A&B, Covid) Nasopharyngeal Swab     Status: Abnormal   Collection Time: 09/22/20  6:25 PM   Specimen: Nasopharyngeal Swab; Nasopharyngeal(NP) swabs in vial transport medium  Result Value Ref Range Status   SARS Coronavirus 2 by RT PCR POSITIVE (A) NEGATIVE Final    Comment: RESULT CALLED TO, READ BACK BY AND VERIFIED WITH: KELLIE NEAL RN '@1950'$  09/22/2020 OLSONM (NOTE) SARS-CoV-2 target nucleic acids are  DETECTED.  The SARS-CoV-2 RNA is generally detectable in upper respiratory specimens during the acute phase of infection. Positive results are indicative of the presence of the identified virus, but do not rule out bacterial infection or co-infection with other pathogens not detected by the test. Clinical correlation with patient history and other diagnostic information is necessary to determine patient infection status. The expected result is Negative.  Fact Sheet for Patients: EntrepreneurPulse.com.au  Fact Sheet for Healthcare Providers: IncredibleEmployment.be  This test is not yet approved or cleared by the [place] FDA and  has been authorized for detection and/or diagnosis of SARS-CoV-2 by FDA under an Emergency Use Authorization (EUA).  This EUA will remain in effect (meaning this test c an be used) for the duration of  the COVID-19 declaration under Section 564(b)(1) of the Act, 21 U.S.C. section 360bbb-3(b)(1), unless the authorization is terminated or revoked sooner.     Influenza A by PCR NEGATIVE NEGATIVE Final   Influenza B by PCR NEGATIVE NEGATIVE Final    Comment: (NOTE) The Xpert Xpress SARS-CoV-2/FLU/RSV plus assay is intended as an aid in the diagnosis of influenza from Nasopharyngeal swab specimens and should not be used as a sole basis for treatment. Nasal washings and aspirates are unacceptable for Xpert Xpress SARS-CoV-2/FLU/RSV testing.  Fact Sheet for Patients: EntrepreneurPulse.com.au  Fact Sheet for Healthcare Providers: IncredibleEmployment.be  This test is not yet approved or cleared by the [place] FDA and has been authorized for detection and/or diagnosis of SARS-CoV-2 by FDA under an Emergency Use Authorization (EUA). This EUA will remain in effect (meaning this test can be used) for the duration of the COVID-19 declaration under Section 564(b)(1) of the Act, 21  U.S.C. section 360bbb-3(b)(1), unless the authorization is terminated or revoked.  Performed at [facility], [address], [place], [place] [address]       Radiology Studies: CT ABDOMEN PELVIS W CONTRAST  Result Date: 09/22/2020 CLINICAL DATA:  Nonlocalized acute abdominal pain. Biliary obstruction or mass. EXAM: CT ABDOMEN AND PELVIS WITH CONTRAST TECHNIQUE: Multidetector CT imaging of the abdomen and pelvis was performed using the standard protocol following bolus administration of intravenous contrast. CONTRAST:  159m OMNIPAQUE IOHEXOL 300 MG/ML  SOLN COMPARISON:  Ultrasound abdomen 09/22/2020. FINDINGS: Lower chest: Bibasilar reticulations. Coronary artery calcifications. Hepatobiliary: No focal liver  abnormality. Homogeneous hyperdensity fills the gallbladder lumen. Intra and extrahepatic biliary ductal dilatation. Pancreas: No focal lesion. Normal pancreatic contour. No surrounding inflammatory changes. No main pancreatic ductal dilatation. Spleen: Normal in size without focal abnormality. Adrenals/Urinary Tract: No adrenal nodule bilaterally. Bilateral kidneys enhance symmetrically. No hydronephrosis. No hydroureter. The urinary bladder is unremarkable. Stomach/Bowel: Stomach is within normal limits. No evidence of bowel wall thickening or dilatation. Under distension of the rectum. Perirectal fat stranding. The appendix not definitely identified. Vascular/Lymphatic: No abdominal aorta or iliac aneurysm. Severe atherosclerotic plaque of the aorta and its branches. No abdominal, pelvic, or inguinal lymphadenopathy. Reproductive: Prostate is unremarkable. Other: No intraperitoneal free fluid. No intraperitoneal free gas. No organized fluid collection. Musculoskeletal: No abdominal wall hernia or abnormality. No suspicious lytic or blastic osseous lesions. No acute displaced fracture. Multilevel degenerative changes of the spine. IMPRESSION: 1. Question bibasilar pulmonary fibrosis. 2.  Homogeneous hyperdensity fills the gallbladder lumen - likely represents gallbladder sludge. Intra and extrahepatic biliary ductal dilatation with no main pancreatic duct dilatation. Findings are consistent with biliary obstruction, possibly due to a common bile duct mass which is incompletely evaluated on this single phase portal venous study. Recommend MRCP for further evaluation. 3. Nonspecific perirectal fat stranding with under distension of the rectum. 4. Aortic Atherosclerosis (ICD10-I70.0). Electronically Signed   By: Iven Finn M.D.   On: 09/22/2020 20:20   MR 3D Recon At Scanner  Result Date: 09/23/2020 CLINICAL DATA:  65 year old male with history of jaundice. EXAM: MRI ABDOMEN WITHOUT AND WITH CONTRAST (INCLUDING MRCP) TECHNIQUE: Multiplanar multisequence MR imaging of the abdomen was performed both before and after the administration of intravenous contrast. Heavily T2-weighted images of the biliary and pancreatic ducts were obtained, and three-dimensional MRCP images were rendered by post processing. CONTRAST:  55m GADAVIST GADOBUTROL 1 MMOL/ML IV SOLN COMPARISON:  CT the abdomen and pelvis 09/22/2020. FINDINGS: Comment: Portions of today's examination are severely limited by artifact from a large amount of patient respiratory motion. Lower chest: Unremarkable. Hepatobiliary: No suspicious cystic or solid hepatic lesions. However, MRCP images demonstrate moderate to severe intra hepatic biliary ductal dilatation. The ductal dilatation appears to abruptly terminate in the proximal common bile duct at or near the junction with the cystic duct (which is very poorly visualized on today's motion limited examination). The source of obstruction is poorly demonstrated, but appears to be a band like area of soft tissue encasing the duct, best appreciated on axial image 20 of series 5 where this is estimated to measure approximately 1.2 x 1.8 cm, and corresponds to areas of enhancement on post gadolinium  imaging. Distal common bile duct is completely decompressed. No filling defects are noted in the distal common bile duct to suggest choledocholithiasis. Gallbladder is moderately distended with T1 hyperintense mildly T2 hyperintense material, which likely represents biliary sludge. Pancreas: No pancreatic mass. No pancreatic ductal dilatation noted on MRCP images. No pancreatic or peripancreatic fluid collections or inflammatory changes. Spleen:  Unremarkable. Adrenals/Urinary Tract: Subcentimeter T1 hypointense, T2 hyperintense, nonenhancing lesions in both kidneys are compatible with simple cysts. No hydroureteronephrosis in the visualized portions of the abdomen. Bilateral adrenal glands are normal in appearance. Stomach/Bowel: Visualized portions are unremarkable. Vascular/Lymphatic: No aneurysm identified in the visualized abdominal vasculature. No lymphadenopathy. Other: No significant volume of ascites noted in the visualized portions of the peritoneal cavity. Musculoskeletal: No aggressive appearing osseous lesions are noted in the visualized portions of the skeleton. IMPRESSION: 1. Obstructing soft tissue mass in the proximal common bile duct at or  adjacent to the junction with the cystic duct, associated with moderate to severe intrahepatic biliary ductal dilatation indicating obstruction, as discussed above. 2. Biliary sludge filling the gallbladder lumen. Electronically Signed   By: Vinnie Langton M.D.   On: 09/23/2020 06:27   MR ABDOMEN MRCP W WO CONTAST  Result Date: 09/23/2020 CLINICAL DATA:  65 year old male with history of jaundice. EXAM: MRI ABDOMEN WITHOUT AND WITH CONTRAST (INCLUDING MRCP) TECHNIQUE: Multiplanar multisequence MR imaging of the abdomen was performed both before and after the administration of intravenous contrast. Heavily T2-weighted images of the biliary and pancreatic ducts were obtained, and three-dimensional MRCP images were rendered by post processing. CONTRAST:  4m  GADAVIST GADOBUTROL 1 MMOL/ML IV SOLN COMPARISON:  CT the abdomen and pelvis 09/22/2020. FINDINGS: Comment: Portions of today's examination are severely limited by artifact from a large amount of patient respiratory motion. Lower chest: Unremarkable. Hepatobiliary: No suspicious cystic or solid hepatic lesions. However, MRCP images demonstrate moderate to severe intra hepatic biliary ductal dilatation. The ductal dilatation appears to abruptly terminate in the proximal common bile duct at or near the junction with the cystic duct (which is very poorly visualized on today's motion limited examination). The source of obstruction is poorly demonstrated, but appears to be a band like area of soft tissue encasing the duct, best appreciated on axial image 20 of series 5 where this is estimated to measure approximately 1.2 x 1.8 cm, and corresponds to areas of enhancement on post gadolinium imaging. Distal common bile duct is completely decompressed. No filling defects are noted in the distal common bile duct to suggest choledocholithiasis. Gallbladder is moderately distended with T1 hyperintense mildly T2 hyperintense material, which likely represents biliary sludge. Pancreas: No pancreatic mass. No pancreatic ductal dilatation noted on MRCP images. No pancreatic or peripancreatic fluid collections or inflammatory changes. Spleen:  Unremarkable. Adrenals/Urinary Tract: Subcentimeter T1 hypointense, T2 hyperintense, nonenhancing lesions in both kidneys are compatible with simple cysts. No hydroureteronephrosis in the visualized portions of the abdomen. Bilateral adrenal glands are normal in appearance. Stomach/Bowel: Visualized portions are unremarkable. Vascular/Lymphatic: No aneurysm identified in the visualized abdominal vasculature. No lymphadenopathy. Other: No significant volume of ascites noted in the visualized portions of the peritoneal cavity. Musculoskeletal: No aggressive appearing osseous lesions are noted in  the visualized portions of the skeleton. IMPRESSION: 1. Obstructing soft tissue mass in the proximal common bile duct at or adjacent to the junction with the cystic duct, associated with moderate to severe intrahepatic biliary ductal dilatation indicating obstruction, as discussed above. 2. Biliary sludge filling the gallbladder lumen. Electronically Signed   By: DVinnie LangtonM.D.   On: 09/23/2020 06:27   UKoreaAbdomen Limited RUQ (LIVER/GB)  Result Date: 09/22/2020 CLINICAL DATA:  Hyperbilirubinemia EXAM: ULTRASOUND ABDOMEN LIMITED RIGHT UPPER QUADRANT COMPARISON:  Ultrasound 04/11/2017 FINDINGS: Gallbladder: The gallbladder is mildly distended, filled with material of varying echogenicity, likely sludge and tiny stones. No pericholecystic fluid or wall thickening. Sonographic Murphy's was not determined by the sonographer. Common bile duct: Diameter: Measures up to 11 mm proximally. Liver: There is intrahepatic biliary ductal dilation. Coarsened liver parenchyma in somewhat nodular left hepatic lobe. Portal vein is patent on color Doppler imaging with normal direction of blood flow towards the liver. Other: None. IMPRESSION: Intrahepatic biliary ductal dilation and dilated proximal common bile duct. Findings are consistent with biliary obstruction, possibly due to a common bile duct mass, incompletely evaluated by ultrasound. Recommend multiphase CT or MRCP for further evaluation. Mildly distended gallbladder filled with material of  varying echogenicity, likely sludge and tiny stones. In the absence of leukocytosis and right upper quadrant pain this is unlikely to represent cholecystitis. Coarsened liver echogenicity with somewhat nodular left hepatic lobe, compatible with chronic liver disease. Electronically Signed   By: Maurine Simmering   On: 09/22/2020 19:26    Scheduled Meds: Continuous Infusions:  sodium chloride       LOS: 1 day   Time spent: 35 minutes   Darliss Cheney, MD [facility]  09/23/2020, 10:21 AM   How to contact the [facility] Attending or Consulting provider [place] or covering provider during after hours [place], for this patient?  Check the care team in [facility] and look for a) attending/consulting [facility] provider listed and b) the [facility] team listed. Page or secure chat [phone]. Log into [email or web address] and use [place]'s universal password to access. If you do not have the password, please contact the hospital operator. Locate the [facility] provider you are looking for under [facility] and page to a number that you can be directly reached. If you still have difficulty reaching the provider, please page the [facility] (Director on Call) for the Hospitalists listed on amion for assistance.

## 2020-09-23 NOTE — Transfer of Care (Signed)
Immediate Anesthesia Transfer of Care Note  Patient: [name] [name]  Procedure(s) Performed: ENDOSCOPIC RETROGRADE CHOLANGIOPANCREATOGRAPHY (ERCP) BILIARY STENT PLACEMENT SPHINCTEROTOMY  Patient Location: Endoscopy Unit  Anesthesia Type:General  Level of Consciousness: awake and alert   Airway & Oxygen Therapy: Patient Spontanous Breathing and Patient connected to face mask oxygen  Post-op Assessment: Report given to RN and Post -op Vital signs reviewed and stable  Post vital signs: Reviewed and stable  Last Vitals:  Vitals Value Taken Time  BP 137/88 09/23/20 1734  Temp    Pulse 85 09/23/20 1734  Resp 14 09/23/20 1734  SpO2 100 % 09/23/20 1734    Last Pain:  Vitals:   09/23/20 1734  TempSrc:   PainSc: 0-No pain      Patients Stated Pain Goal: 2 ([id number])  Complications: No notable events documented.

## 2020-09-23 NOTE — Consult Note (Signed)
Reason for Consult:Obstructive jaundice Referring Physician: [facility]  [name] HPI: This is a 65 year old male with a PMH of ETOH abuse, COPD, DM, and sleep apnea admitted for complaints of diarrhea (? Steatorrhea), headaches, weakness, abdominal discomfort, and jaundice.  The patient's symptoms started one week ago and his family members experienced similar symptoms, but not jaundice.  His symptoms continued to persist and he had a virtual visit with his PCP yesterday.  His jaundice was noted by his PCP and this issue only started yesterday AM.  As a result of his presentation he was advised to present to the ER.  Work up showed a TB at 15.3 and AP at 475.  His liver enzymes did improve with his ETOH cessation 9 months ago.  Imaging in the ER with a RUQ and CT scan both showed intra and extrahepatic biliary ductal dilation.  The MRCP last evening shows that there is a soft tissue band measuring 1.2 x 1.8 cm encasing the proximal CBD at the junction of the CBD and cystic duct.  Past Medical History:  Diagnosis Date   Alcoholism ([address])    Anxiety    COPD (chronic obstructive pulmonary disease) ([place])    Depression    " post traumatic stress disorder" sees MD in [place] every 3-6 months.   Diabetes mellitus without complication (HCC)    Dysrhythmia    hx. Bundle branch block- saw Dr. Kathlee Nations   Irregular heart rate    Sleep apnea    cpap used with nose clip- Dr. Baird Lyons follows   Substance abuse [facility])    "tends to self medicate(Rx. meds or Alcohol) trying to get relief from "PSTD"    Past Surgical History:  Procedure Laterality Date   COLONOSCOPY WITH PROPOFOL N/A 01/19/2014   Procedure: COLONOSCOPY WITH PROPOFOL;  Surgeon: Juanita Craver, MD;  Location: [facility];  Service: Endoscopy;  Laterality: N/A;    Family History  Problem Relation Age of Onset   Diabetes Father    Heart Problems Father    Heart disease Father    Mental illness Father      Social History:  reports that he has been smoking cigarettes. He started smoking about 3 years ago. He has a 45.00 pack-year smoking history. He has never used smokeless tobacco. He reports current alcohol use of about 42.0 standard drinks of alcohol per week. He reports that he does not use drugs.  Allergies: No Known Allergies  Medications: Scheduled: Continuous:  Results for orders placed or performed during the hospital encounter of 09/22/20 (from the past 24 hour(s))  Lipase, blood     Status: None   Collection Time: 09/22/20  5:33 PM  Result Value Ref Range   Lipase 19 11 - 51 U/L  Comprehensive metabolic panel     Status: Abnormal   Collection Time: 09/22/20  5:33 PM  Result Value Ref Range   Sodium 135 135 - 145 mmol/L   Potassium 3.8 3.5 - 5.1 mmol/L   Chloride 98 98 - 111 mmol/L   CO2 28 22 - 32 mmol/L   Glucose, Bld 109 (H) 70 - 99 mg/dL   BUN 13 8 - 23 mg/dL   Creatinine, Ser 0.71 0.61 - 1.24 mg/dL   Calcium 8.6 (L) 8.9 - 10.3 mg/dL   Total Protein 6.3 (L) 6.5 - 8.1 g/dL   Albumin 3.2 (L) 3.5 - 5.0 g/dL   AST 110 (H) 15 - 41 U/L   ALT 148 (H)  0 - 44 U/L   Alkaline Phosphatase 475 (H) 38 - 126 U/L   Total Bilirubin 15.3 (H) 0.3 - 1.2 mg/dL   GFR, Estimated >60 >60 mL/min   Anion gap 9 5 - 15  CBC     Status: Abnormal   Collection Time: 09/22/20  5:33 PM  Result Value Ref Range   WBC 9.4 4.0 - 10.5 K/uL   RBC 4.03 (L) 4.22 - 5.81 MIL/uL   Hemoglobin 13.7 13.0 - 17.0 g/dL   HCT 38.8 (L) 39.0 - 52.0 %   MCV 96.3 80.0 - 100.0 fL   MCH 34.0 26.0 - 34.0 pg   MCHC 35.3 30.0 - 36.0 g/dL   RDW 17.4 (H) 11.5 - 15.5 %   Platelets 219 150 - 400 K/uL   nRBC 0.0 0.0 - 0.2 %  CK     Status: None   Collection Time: 09/22/20  5:33 PM  Result Value Ref Range   Total CK 63 49 - 397 U/L  Acetaminophen level     Status: Abnormal   Collection Time: 09/22/20  5:33 PM  Result Value Ref Range   Acetaminophen (Tylenol), Serum <10 (L) 10 - 30 ug/mL  Urinalysis, Routine w  reflex microscopic Urine, Clean Catch     Status: Abnormal   Collection Time: 09/22/20  5:34 PM  Result Value Ref Range   Color, Urine ORANGE (A) YELLOW   APPearance CLOUDY (A) CLEAR   Specific Gravity, Urine 1.025 1.005 - 1.030   pH 6.5 5.0 - 8.0   Glucose, UA 100 (A) NEGATIVE mg/dL   Hgb urine dipstick NEGATIVE NEGATIVE   Bilirubin Urine LARGE (A) NEGATIVE   Ketones, ur NEGATIVE NEGATIVE mg/dL   Protein, ur NEGATIVE NEGATIVE mg/dL   Nitrite NEGATIVE NEGATIVE   Leukocytes,Ua NEGATIVE NEGATIVE  Protime-INR     Status: None   Collection Time: 09/22/20  6:25 PM  Result Value Ref Range   Prothrombin Time 12.0 11.4 - 15.2 seconds   INR 0.9 0.8 - 1.2  Resp Panel by RT-PCR (Flu A&B, Covid) Nasopharyngeal Swab     Status: Abnormal   Collection Time: 09/22/20  6:25 PM   Specimen: Nasopharyngeal Swab; Nasopharyngeal(NP) swabs in vial transport medium  Result Value Ref Range   SARS Coronavirus 2 by RT PCR POSITIVE (A) NEGATIVE   Influenza A by PCR NEGATIVE NEGATIVE   Influenza B by PCR NEGATIVE NEGATIVE  Glucose, capillary     Status: None   Collection Time: 09/22/20 11:12 PM  Result Value Ref Range   Glucose-Capillary 85 70 - 99 mg/dL  Comprehensive metabolic panel     Status: Abnormal   Collection Time: 09/23/20  3:41 AM  Result Value Ref Range   Sodium 135 135 - 145 mmol/L   Potassium 3.5 3.5 - 5.1 mmol/L   Chloride 103 98 - 111 mmol/L   CO2 25 22 - 32 mmol/L   Glucose, Bld 87 70 - 99 mg/dL   BUN 14 8 - 23 mg/dL   Creatinine, Ser <0.30 (L) 0.61 - 1.24 mg/dL   Calcium 8.7 (L) 8.9 - 10.3 mg/dL   Total Protein 5.8 (L) 6.5 - 8.1 g/dL   Albumin 2.8 (L) 3.5 - 5.0 g/dL   AST 85 (H) 15 - 41 U/L   ALT 128 (H) 0 - 44 U/L   Alkaline Phosphatase 451 (H) 38 - 126 U/L   Total Bilirubin 14.6 (H) 0.3 - 1.2 mg/dL   GFR, Estimated NOT CALCULATED >60 mL/min  Anion gap 7 5 - 15  CBC     Status: Abnormal   Collection Time: 09/23/20  3:41 AM  Result Value Ref Range   WBC 8.4 4.0 - 10.5 K/uL    RBC 3.84 (L) 4.22 - 5.81 MIL/uL   Hemoglobin 13.1 13.0 - 17.0 g/dL   HCT 37.4 (L) 39.0 - 52.0 %   MCV 97.4 80.0 - 100.0 fL   MCH 34.1 (H) 26.0 - 34.0 pg   MCHC 35.0 30.0 - 36.0 g/dL   RDW 17.3 (H) 11.5 - 15.5 %   Platelets 191 150 - 400 K/uL   nRBC 0.0 0.0 - 0.2 %  Protime-INR     Status: None   Collection Time: 09/23/20  3:41 AM  Result Value Ref Range   Prothrombin Time 12.4 11.4 - 15.2 seconds   INR 0.9 0.8 - 1.2     CT ABDOMEN PELVIS W CONTRAST  Result Date: 09/22/2020 CLINICAL DATA:  Nonlocalized acute abdominal pain. Biliary obstruction or mass. EXAM: CT ABDOMEN AND PELVIS WITH CONTRAST TECHNIQUE: Multidetector CT imaging of the abdomen and pelvis was performed using the standard protocol following bolus administration of intravenous contrast. CONTRAST:  152m OMNIPAQUE IOHEXOL 300 MG/ML  SOLN COMPARISON:  Ultrasound abdomen 09/22/2020. FINDINGS: Lower chest: Bibasilar reticulations. Coronary artery calcifications. Hepatobiliary: No focal liver abnormality. Homogeneous hyperdensity fills the gallbladder lumen. Intra and extrahepatic biliary ductal dilatation. Pancreas: No focal lesion. Normal pancreatic contour. No surrounding inflammatory changes. No main pancreatic ductal dilatation. Spleen: Normal in size without focal abnormality. Adrenals/Urinary Tract: No adrenal nodule bilaterally. Bilateral kidneys enhance symmetrically. No hydronephrosis. No hydroureter. The urinary bladder is unremarkable. Stomach/Bowel: Stomach is within normal limits. No evidence of bowel wall thickening or dilatation. Under distension of the rectum. Perirectal fat stranding. The appendix not definitely identified. Vascular/Lymphatic: No abdominal aorta or iliac aneurysm. Severe atherosclerotic plaque of the aorta and its branches. No abdominal, pelvic, or inguinal lymphadenopathy. Reproductive: Prostate is unremarkable. Other: No intraperitoneal free fluid. No intraperitoneal free gas. No organized fluid  collection. Musculoskeletal: No abdominal wall hernia or abnormality. No suspicious lytic or blastic osseous lesions. No acute displaced fracture. Multilevel degenerative changes of the spine. IMPRESSION: 1. Question bibasilar pulmonary fibrosis. 2. Homogeneous hyperdensity fills the gallbladder lumen - likely represents gallbladder sludge. Intra and extrahepatic biliary ductal dilatation with no main pancreatic duct dilatation. Findings are consistent with biliary obstruction, possibly due to a common bile duct mass which is incompletely evaluated on this single phase portal venous study. Recommend MRCP for further evaluation. 3. Nonspecific perirectal fat stranding with under distension of the rectum. 4. Aortic Atherosclerosis (ICD10-I70.0). Electronically Signed   By: MIven FinnM.D.   On: 09/22/2020 20:20   MR 3D Recon At Scanner  Result Date: 09/23/2020 CLINICAL DATA:  65year old male with history of jaundice. EXAM: MRI ABDOMEN WITHOUT AND WITH CONTRAST (INCLUDING MRCP) TECHNIQUE: Multiplanar multisequence MR imaging of the abdomen was performed both before and after the administration of intravenous contrast. Heavily T2-weighted images of the biliary and pancreatic ducts were obtained, and three-dimensional MRCP images were rendered by post processing. CONTRAST:  854mGADAVIST GADOBUTROL 1 MMOL/ML IV SOLN COMPARISON:  CT the abdomen and pelvis 09/22/2020. FINDINGS: Comment: Portions of today's examination are severely limited by artifact from a large amount of patient respiratory motion. Lower chest: Unremarkable. Hepatobiliary: No suspicious cystic or solid hepatic lesions. However, MRCP images demonstrate moderate to severe intra hepatic biliary ductal dilatation. The ductal dilatation appears to abruptly terminate in the proximal common  bile duct at or near the junction with the cystic duct (which is very poorly visualized on today's motion limited examination). The source of obstruction is poorly  demonstrated, but appears to be a band like area of soft tissue encasing the duct, best appreciated on axial image 20 of series 5 where this is estimated to measure approximately 1.2 x 1.8 cm, and corresponds to areas of enhancement on post gadolinium imaging. Distal common bile duct is completely decompressed. No filling defects are noted in the distal common bile duct to suggest choledocholithiasis. Gallbladder is moderately distended with T1 hyperintense mildly T2 hyperintense material, which likely represents biliary sludge. Pancreas: No pancreatic mass. No pancreatic ductal dilatation noted on MRCP images. No pancreatic or peripancreatic fluid collections or inflammatory changes. Spleen:  Unremarkable. Adrenals/Urinary Tract: Subcentimeter T1 hypointense, T2 hyperintense, nonenhancing lesions in both kidneys are compatible with simple cysts. No hydroureteronephrosis in the visualized portions of the abdomen. Bilateral adrenal glands are normal in appearance. Stomach/Bowel: Visualized portions are unremarkable. Vascular/Lymphatic: No aneurysm identified in the visualized abdominal vasculature. No lymphadenopathy. Other: No significant volume of ascites noted in the visualized portions of the peritoneal cavity. Musculoskeletal: No aggressive appearing osseous lesions are noted in the visualized portions of the skeleton. IMPRESSION: 1. Obstructing soft tissue mass in the proximal common bile duct at or adjacent to the junction with the cystic duct, associated with moderate to severe intrahepatic biliary ductal dilatation indicating obstruction, as discussed above. 2. Biliary sludge filling the gallbladder lumen. Electronically Signed   By: Vinnie Langton M.D.   On: 09/23/2020 06:27   MR ABDOMEN MRCP W WO CONTAST  Result Date: 09/23/2020 CLINICAL DATA:  65 year old male with history of jaundice. EXAM: MRI ABDOMEN WITHOUT AND WITH CONTRAST (INCLUDING MRCP) TECHNIQUE: Multiplanar multisequence MR imaging of the  abdomen was performed both before and after the administration of intravenous contrast. Heavily T2-weighted images of the biliary and pancreatic ducts were obtained, and three-dimensional MRCP images were rendered by post processing. CONTRAST:  88m GADAVIST GADOBUTROL 1 MMOL/ML IV SOLN COMPARISON:  CT the abdomen and pelvis 09/22/2020. FINDINGS: Comment: Portions of today's examination are severely limited by artifact from a large amount of patient respiratory motion. Lower chest: Unremarkable. Hepatobiliary: No suspicious cystic or solid hepatic lesions. However, MRCP images demonstrate moderate to severe intra hepatic biliary ductal dilatation. The ductal dilatation appears to abruptly terminate in the proximal common bile duct at or near the junction with the cystic duct (which is very poorly visualized on today's motion limited examination). The source of obstruction is poorly demonstrated, but appears to be a band like area of soft tissue encasing the duct, best appreciated on axial image 20 of series 5 where this is estimated to measure approximately 1.2 x 1.8 cm, and corresponds to areas of enhancement on post gadolinium imaging. Distal common bile duct is completely decompressed. No filling defects are noted in the distal common bile duct to suggest choledocholithiasis. Gallbladder is moderately distended with T1 hyperintense mildly T2 hyperintense material, which likely represents biliary sludge. Pancreas: No pancreatic mass. No pancreatic ductal dilatation noted on MRCP images. No pancreatic or peripancreatic fluid collections or inflammatory changes. Spleen:  Unremarkable. Adrenals/Urinary Tract: Subcentimeter T1 hypointense, T2 hyperintense, nonenhancing lesions in both kidneys are compatible with simple cysts. No hydroureteronephrosis in the visualized portions of the abdomen. Bilateral adrenal glands are normal in appearance. Stomach/Bowel: Visualized portions are unremarkable. Vascular/Lymphatic: No  aneurysm identified in the visualized abdominal vasculature. No lymphadenopathy. Other: No significant volume of ascites noted  in the visualized portions of the peritoneal cavity. Musculoskeletal: No aggressive appearing osseous lesions are noted in the visualized portions of the skeleton. IMPRESSION: 1. Obstructing soft tissue mass in the proximal common bile duct at or adjacent to the junction with the cystic duct, associated with moderate to severe intrahepatic biliary ductal dilatation indicating obstruction, as discussed above. 2. Biliary sludge filling the gallbladder lumen. Electronically Signed   By: Vinnie Langton M.D.   On: 09/23/2020 06:27   US Abdomen Limited RUQ (LIVER/GB)  Result Date: 09/22/2020 CLINICAL DATA:  Hyperbilirubinemia EXAM: ULTRASOUND ABDOMEN LIMITED RIGHT UPPER QUADRANT COMPARISON:  Ultrasound 04/11/2017 FINDINGS: Gallbladder: The gallbladder is mildly distended, filled with material of varying echogenicity, likely sludge and tiny stones. No pericholecystic fluid or wall thickening. Sonographic Murphy's was not determined by the sonographer. Common bile duct: Diameter: Measures up to 11 mm proximally. Liver: There is intrahepatic biliary ductal dilation. Coarsened liver parenchyma in somewhat nodular left hepatic lobe. Portal vein is patent on color Doppler imaging with normal direction of blood flow towards the liver. Other: None. IMPRESSION: Intrahepatic biliary ductal dilation and dilated proximal common bile duct. Findings are consistent with biliary obstruction, possibly due to a common bile duct mass, incompletely evaluated by ultrasound. Recommend multiphase CT or MRCP for further evaluation. Mildly distended gallbladder filled with material of varying echogenicity, likely sludge and tiny stones. In the absence of leukocytosis and right upper quadrant pain this is unlikely to represent cholecystitis. Coarsened liver echogenicity with somewhat nodular left hepatic lobe,  compatible with chronic liver disease. Electronically Signed   By: Maurine Simmering   On: 09/22/2020 19:26    ROS:  As stated above in the HPI otherwise negative.  Blood pressure (!) 139/93, pulse 60, temperature 97.8 F (36.6 C), temperature source Oral, resp. rate 20, height '5\' 10"'$  (1.778 m), weight 80.9 kg, SpO2 100 %.    PE: Gen: NAD, Alert and Oriented HEENT:  [place]/AT, EOMI Neck: Supple, no LAD Lungs: CTA Bilaterally CV: RRR without M/G/R ABD: Soft, NTND, +BS Ext: No C/C/E  Assessment/Plan: 1) Obstructive jaundice. 2) Biliary ductal dilation. 3) Incidental COVID.   The findings are very worrisome.  There is a soft tissue mass and his TB on admission was 15.  Further evaluation and treatment is necessary with an ERCP.  Plan: 1) ERCP, hopefully today.  This is depending on anesthesia availability.  Doria Fern D 09/23/2020, 7:17 AM

## 2020-09-23 NOTE — Anesthesia Preprocedure Evaluation (Addendum)
Anesthesia Evaluation  Patient identified by MRN, date of birth, ID band Patient awake    Reviewed: Allergy & Precautions, NPO status , Patient's Chart, lab work & pertinent test results  Airway Mallampati: I  TM Distance: >3 FB Neck ROM: Full    Dental  (+) Poor Dentition, Missing, Dental Advisory Given,    Pulmonary sleep apnea and Continuous Positive Airway Pressure Ventilation , COPD,  COPD inhaler, Current Smoker (1.5ppd),  Still smoking, 45 pack year history Interstitial pulmonary fibrosis  COVID positive     Pulmonary exam normal breath sounds clear to auscultation       Cardiovascular Normal cardiovascular exam Rhythm:Regular Rate:Normal  Echo 2014: Left ventricle: The cavity size was normal. There was mild  concentric hypertrophy. Systolic function was normal. Wall  motion was normal; there were no regional wall motion  abnormalities.    Neuro/Psych PSYCHIATRIC DISORDERS Anxiety Depression negative neurological ROS     GI/Hepatic negative GI ROS, (+)     substance abuse (hx alcohol abuse- 42 drinks/wk-  sober since undergoing a detox program in October 2021.)  alcohol use, Biliary obstruction   Endo/Other  diabetes, Well Controlled, Type 2, Oral Hypoglycemic Agentsa1c 5.0  Renal/GU negative Renal ROS  negative genitourinary   Musculoskeletal negative musculoskeletal ROS (+)   Abdominal   Peds  Hematology negative hematology ROS (+) hct 37.4   Anesthesia Other Findings   Reproductive/Obstetrics negative OB ROS                           Anesthesia Physical Anesthesia Plan  ASA: 3  Anesthesia Plan: General   Post-op Pain Management:    Induction: Intravenous and Rapid sequence  PONV Risk Score and Plan: 1 and Ondansetron, Dexamethasone, Midazolam and Treatment may vary due to age or medical condition  Airway Management Planned: Oral ETT  Additional Equipment:  None  Intra-op Plan:   Post-operative Plan: Extubation in OR  Informed Consent: I have reviewed the patients History and Physical, chart, labs and discussed the procedure including the risks, benefits and alternatives for the proposed anesthesia with the patient or authorized representative who has indicated his/her understanding and acceptance.     Dental advisory given  Plan Discussed with: CRNA  Anesthesia Plan Comments: (COVID precautions)        Anesthesia Quick Evaluation

## 2020-09-23 NOTE — H&P (Signed)
History and Physical    [name] [name] [id number] DOB: 11/02/1955 DOA: 09/22/2020  PCP: Hoyt Koch, MD  Patient coming from: [place]  I have personally briefly reviewed patient's old medical records in [place]  Chief Complaint: Abdominal pain  HPI: [name] [name] is a 65 y.o. male with medical history significant for COPD with interstitial pulmonary fibrosis, type 2 diabetes, depression/anxiety, alcohol use disorder, and OSA on CPAP who presented to the ED for evaluation of abdominal pain.  Patient states that 1 week ago he developed sudden onset of severe lower abdominal pain.  He says he and his family developed "stomach bug" type symptoms.  He was experiencing some urgency for bowel movement but had scant loose stools.  He says his family's symptoms resolved after short period of time however his continued.  This morning he noticed that his skin and eyes appeared to be yellow.  He had a telemedicine visit with his primary care and was advised to present to the ED for further management.  Patient reports a history of alcohol use but has been sober since undergoing a detox program in October 2021.  ED Course:  Initial vitals showed BP 130/78, pulse 73, RR 18, temp 98.7 F, SPO2 94% on room air.  Labs significant for total bilirubin 15.3, AST 110, ALT 148, alkaline phosphatase 475, sodium 135, potassium 3.8, bicarb 28, BUN 13, creatinine 0.71, serum glucose 109, WBC 9.4, hemoglobin 13.7, platelets 219,000, CK 63.  Acetaminophen level <10, INR 0.9.  Acute hepatitis panel in process.  SARS-CoV-2 PCR is positive.  Influenza A/B PCR's are negative.  RUQ ultrasound showed intrahepatic biliary ductal dilatation and dilated proximal common bile duct consistent with biliary obstruction.  CT abdomen/pelvis with contrast again showed findings consistent with biliary obstruction possibly due to common bile duct mass.  Patient was given 500 cc normal saline.   EDP discussed with on-call GI who recommended admission and MRCP.  The hospitalist service was consulted to admit for further evaluation and management.  Review of Systems: All systems reviewed and are negative except as documented in history of present illness above.   Past Medical History:  Diagnosis Date   Alcoholism ([place])    Anxiety    COPD (chronic obstructive pulmonary disease) ([place])    Depression    " post traumatic stress disorder" sees MD in [place] every 3-6 months.   Diabetes mellitus without complication (HCC)    Dysrhythmia    hx. Bundle branch block- saw Dr. Kathlee Nations   Irregular heart rate    Sleep apnea    cpap used with nose clip- Dr. Baird Lyons follows   Substance abuse [facility])    "tends to self medicate(Rx. meds or Alcohol) trying to get relief from "PSTD"    Past Surgical History:  Procedure Laterality Date   COLONOSCOPY WITH PROPOFOL N/A 01/19/2014   Procedure: COLONOSCOPY WITH PROPOFOL;  Surgeon: Juanita Craver, MD;  Location: [facility] END[facility];  Service: Endoscopy;  Laterality: N/A;    Social History:  reports that he has been smoking cigarettes. He started smoking about 3 years ago. He has a 45.00 pack-year smoking history. He has never used smokeless tobacco. He reports current alcohol use of about 42.0 standard drinks of alcohol per week. He reports that he does not use drugs.  No Known Allergies  Family History  Problem Relation Age of Onset   Diabetes Father    Heart Problems Father    Heart disease Father  Mental illness Father      Prior to Admission medications   Medication Sig Start Date End Date Taking? Authorizing Provider  albuterol (VENTOLIN HFA) 108 (90 Base) MCG/ACT inhaler Inhale 2 puffs into the lungs every 6 (six) hours as needed for wheezing or shortness of breath. 10/09/19   Baird Lyons D, MD  Ascorbic Acid (VITAMIN C) 1000 MG tablet Take 1,000 mg by mouth daily.    [provider]  aspirin 325 MG tablet Take  325 mg by mouth daily.    [provider]  busPIRone (BUSPAR) 5 MG tablet Take 5 mg by mouth at bedtime. 07/01/20   [provider]  Cholecalciferol (VITAMIN D) 2000 UNITS tablet Take 2,000 Units by mouth daily.    [provider]  CLENPIQ 10-3.5-12 MG-GM -GM/160ML SOLN Take by mouth every 30 (thirty) days. 04/14/20   [provider]  clonazePAM (KLONOPIN) 0.5 MG tablet TAKE 1 TABLET BY MOUTH TWICE DAILY AS NEEDED 12/25/17   Baird Lyons D, MD  cloNIDine (CATAPRES) 0.1 MG tablet Take 0.1 mg by mouth 2 (two) times daily. 07/07/20   [provider]  folic acid (FOLVITE) 1 MG tablet Take 1 mg by mouth daily. 07/19/20   [provider]  magnesium oxide (MAG-OX) 400 (240 Mg) MG tablet Take 1 tablet by mouth daily. 04/14/20   [provider]  metFORMIN (GLUCOPHAGE) 1000 MG tablet TAKE 1 TABLET(1000 MG) BY MOUTH TWICE DAILY WITH A MEAL 08/10/20   Hoyt Koch, MD  Naltrexone (VIVITROL) 380 MG SUSR     [provider]  PARoxetine (PAXIL) 10 MG tablet  12/02/18   [provider]  QUEtiapine (SEROQUEL) 50 MG tablet Take 50 mg by mouth at bedtime. 07/07/20   [provider]  rosuvastatin (CRESTOR) 5 MG tablet Take 1 tablet (5 mg total) by mouth daily. 07/28/20   Hoyt Koch, MD  tiaGABine (GABITRIL) 4 MG tablet Take 4 mg by mouth at bedtime.    [provider]  VASCEPA 1 g capsule TAKE 2 CAPSULES(2 GRAMS) BY MOUTH TWICE DAILY 01/11/20   Hilty, Nadean Corwin, MD  vitamin B-12 (CYANOCOBALAMIN) 500 MCG tablet Take 500 mcg by mouth daily before supper.     [provider]    Physical Exam: Vitals:   09/22/20 1830 09/22/20 1930 09/22/20 2230 09/22/20 2312  BP: 113/83 127/73 120/74 131/86  Pulse: 61 76 65 (!) 57  Resp: '18 20 18 20  '$ Temp:   98.8 F (37.1 C) 98.4 F (36.9 C)  TempSrc:   Oral Oral  SpO2: 97% 97% 96% 99%  Weight:    80.9 kg  Height:    '5\' 10"'$  (1.778 m)   Constitutional: NAD,  calm, comfortable Eyes: PERRL, scleral icterus. ENMT: Mucous membranes are moist. Posterior pharynx clear of any exudate or lesions.Normal dentition.  Neck: normal, supple, no masses. Respiratory: Fine inspiratory crackles bibasilar. Normal respiratory effort. No accessory muscle use.  Cardiovascular: Regular rate and rhythm, no murmurs / rubs / gallops. No extremity edema. 2+ pedal pulses. Abdomen: no tenderness, no masses palpated. No hepatosplenomegaly. Bowel sounds positive.  Musculoskeletal: no clubbing / cyanosis. No joint deformity upper and lower extremities. Good ROM, no contractures. Normal muscle tone.  Skin: Jaundiced appearance.  No rashes, lesions, ulcers. No induration Neurologic: CN 2-12 grossly intact. Sensation intact. Strength 5/5 in all 4.  Psychiatric: Normal judgment and insight. Alert and oriented x 3. Normal mood.   Labs on Admission: I have personally reviewed following  labs and imaging studies  CBC: Recent Labs  Lab 09/22/20 1733  WBC 9.4  HGB 13.7  HCT 38.8*  MCV 96.3  PLT [id number]   Basic Metabolic Panel: Recent Labs  Lab 09/22/20 1733  NA 135  K 3.8  CL 98  CO2 28  GLUCOSE 109*  BUN 13  CREATININE 0.71  CALCIUM 8.6*   GFR: Estimated Creatinine Clearance: 98.9 mL/min (by C-G formula based on SCr of 0.71 mg/dL). Liver Function Tests: Recent Labs  Lab 09/22/20 1733  AST 110*  ALT 148*  ALKPHOS 475*  BILITOT 15.3*  PROT 6.3*  ALBUMIN 3.2*   Recent Labs  Lab 09/22/20 1733  LIPASE 19   No results for input(s): AMMONIA in the last 168 hours. Coagulation Profile: Recent Labs  Lab 09/22/20 1825  INR 0.9   Cardiac Enzymes: Recent Labs  Lab 09/22/20 1733  CKTOTAL 63   BNP (last 3 results) No results for input(s): PROBNP in the last 8760 hours. HbA1C: No results for input(s): HGBA1C in the last 72 hours. CBG: Recent Labs  Lab 09/22/20 2312  GLUCAP 85   Lipid Profile: No results for input(s): CHOL, HDL, LDLCALC, TRIG, CHOLHDL,  LDLDIRECT in the last 72 hours. Thyroid Function Tests: No results for input(s): TSH, T4TOTAL, FREET4, T3FREE, THYROIDAB in the last 72 hours. Anemia Panel: No results for input(s): VITAMINB12, FOLATE, FERRITIN, TIBC, IRON, RETICCTPCT in the last 72 hours. Urine analysis:    Component Value Date/Time   COLORURINE ORANGE (A) 09/22/2020 1734   APPEARANCEUR CLOUDY (A) 09/22/2020 1734   LABSPEC 1.025 09/22/2020 1734   PHURINE 6.5 09/22/2020 1734   GLUCOSEU 100 (A) 09/22/2020 1734   HGBUR NEGATIVE 09/22/2020 1734   BILIRUBINUR LARGE (A) 09/22/2020 1734   BILIRUBINUR Neg 04/22/2018 1040   KETONESUR NEGATIVE 09/22/2020 1734   PROTEINUR NEGATIVE 09/22/2020 1734   UROBILINOGEN 0.2 04/22/2018 1040   UROBILINOGEN 0.2 09/27/2012 0625   NITRITE NEGATIVE 09/22/2020 1734   LEUKOCYTESUR NEGATIVE 09/22/2020 1734    Radiological Exams on Admission: CT ABDOMEN PELVIS W CONTRAST  Result Date: 09/22/2020 CLINICAL DATA:  Nonlocalized acute abdominal pain. Biliary obstruction or mass. EXAM: CT ABDOMEN AND PELVIS WITH CONTRAST TECHNIQUE: Multidetector CT imaging of the abdomen and pelvis was performed using the standard protocol following bolus administration of intravenous contrast. CONTRAST:  15m OMNIPAQUE IOHEXOL 300 MG/ML  SOLN COMPARISON:  Ultrasound abdomen 09/22/2020. FINDINGS: Lower chest: Bibasilar reticulations. Coronary artery calcifications. Hepatobiliary: No focal liver abnormality. Homogeneous hyperdensity fills the gallbladder lumen. Intra and extrahepatic biliary ductal dilatation. Pancreas: No focal lesion. Normal pancreatic contour. No surrounding inflammatory changes. No main pancreatic ductal dilatation. Spleen: Normal in size without focal abnormality. Adrenals/Urinary Tract: No adrenal nodule bilaterally. Bilateral kidneys enhance symmetrically. No hydronephrosis. No hydroureter. The urinary bladder is unremarkable. Stomach/Bowel: Stomach is within normal limits. No evidence of bowel wall  thickening or dilatation. Under distension of the rectum. Perirectal fat stranding. The appendix not definitely identified. Vascular/Lymphatic: No abdominal aorta or iliac aneurysm. Severe atherosclerotic plaque of the aorta and its branches. No abdominal, pelvic, or inguinal lymphadenopathy. Reproductive: Prostate is unremarkable. Other: No intraperitoneal free fluid. No intraperitoneal free gas. No organized fluid collection. Musculoskeletal: No abdominal wall hernia or abnormality. No suspicious lytic or blastic osseous lesions. No acute displaced fracture. Multilevel degenerative changes of the spine. IMPRESSION: 1. Question bibasilar pulmonary fibrosis. 2. Homogeneous hyperdensity fills the gallbladder lumen - likely represents gallbladder sludge. Intra and extrahepatic biliary ductal dilatation with no main pancreatic duct dilatation. Findings are consistent  with biliary obstruction, possibly due to a common bile duct mass which is incompletely evaluated on this single phase portal venous study. Recommend MRCP for further evaluation. 3. Nonspecific perirectal fat stranding with under distension of the rectum. 4. Aortic Atherosclerosis (ICD10-I70.0). Electronically Signed   By: Iven Finn M.D.   On: 09/22/2020 20:20   US Abdomen Limited RUQ (LIVER/GB)  Result Date: 09/22/2020 CLINICAL DATA:  Hyperbilirubinemia EXAM: ULTRASOUND ABDOMEN LIMITED RIGHT UPPER QUADRANT COMPARISON:  Ultrasound 04/11/2017 FINDINGS: Gallbladder: The gallbladder is mildly distended, filled with material of varying echogenicity, likely sludge and tiny stones. No pericholecystic fluid or wall thickening. Sonographic Murphy's was not determined by the sonographer. Common bile duct: Diameter: Measures up to 11 mm proximally. Liver: There is intrahepatic biliary ductal dilation. Coarsened liver parenchyma in somewhat nodular left hepatic lobe. Portal vein is patent on color Doppler imaging with normal direction of blood flow towards  the liver. Other: None. IMPRESSION: Intrahepatic biliary ductal dilation and dilated proximal common bile duct. Findings are consistent with biliary obstruction, possibly due to a common bile duct mass, incompletely evaluated by ultrasound. Recommend multiphase CT or MRCP for further evaluation. Mildly distended gallbladder filled with material of varying echogenicity, likely sludge and tiny stones. In the absence of leukocytosis and right upper quadrant pain this is unlikely to represent cholecystitis. Coarsened liver echogenicity with somewhat nodular left hepatic lobe, compatible with chronic liver disease. Electronically Signed   By: Maurine Simmering   On: 09/22/2020 19:26    EKG: Not performed.  Assessment/Plan Principal Problem:   Biliary obstruction Active Problems:   Obstructive sleep apnea   Hyperlipidemia associated with type 2 diabetes mellitus ([place])   COPD mixed type (HCC)   Alcohol dependence in remission ([place])   Diabetes mellitus type 2 with complications ([place])   Interstitial pulmonary fibrosis (HCC)   [name] [name] is a 65 y.o. male with medical history significant for COPD with interstitial pulmonary fibrosis, type 2 diabetes, depression/anxiety, alcohol use disorder, and OSA who is admitted with biliary obstruction.  Biliary obstruction: Patient with significant hyperbilirubinemia found to have biliary obstruction suspected due to mass on ultrasound and CT abdomen.  MRCP has been ordered and pending. -GI consulted -Follow MRCP results -Keep n.p.o. -Follow CMP and acute hepatitis panel  Positive COVID-19 test: Incidental positive finding.  Patient does report recent gastroenteritis symptoms between himself and his family which may have been COVID symptoms.  Otherwise asymptomatic at this time.  Will keep on precautions for now but no directed therapy at this point.  COPD/interstitial pulmonary fibrosis: Chronic and stable.  Continue albuterol as needed.  Type II  diabetes: Hold home metformin for now.  Depression/anxiety: Resume home meds once med medication reconciliation complete.  Hyperlipidemia: Hold rosuvastatin with elevated LFTs.  Alcohol use disorder: In remission, reports he has been sober for 9 months.  OSA: Continue CPAP nightly.  DVT prophylaxis: SCDs Code Status: Full code, confirmed with patient Family Communication: Discussed with patient, he has discussed with family Disposition Plan: From home and likely return at home pending clinical progress Consults called: GI Level of care: Med-Surg Admission status:  Status is: Inpatient  Remains inpatient appropriate because:Ongoing diagnostic testing needed not appropriate for outpatient work up  Dispo: The patient is from: Home              Anticipated d/c is to: Home              Patient currently is not medically stable to d/c.  Difficult to place patient No  Zada Finders MD [facility]  If 7PM-7AM, please contact night-coverage [email or web address]  09/23/2020, 12:28 AM

## 2020-09-23 NOTE — Anesthesia Procedure Notes (Signed)
Procedure Name: Intubation Date/Time: 09/23/2020 4:24 PM Performed by: Cynda Familia, CRNA Pre-anesthesia Checklist: Patient identified, Emergency Drugs available, Suction available and Patient being monitored Patient Re-evaluated:Patient Re-evaluated prior to induction Oxygen Delivery Method: Circle System Utilized Preoxygenation: Pre-oxygenation with 100% oxygen Induction Type: IV induction Ventilation: Mask ventilation without difficulty Laryngoscope Size: Miller and 2 Grade View: Grade I Tube type: Oral Number of attempts: 1 Airway Equipment and Method: Stylet Placement Confirmation: ETT inserted through vocal cords under direct vision, positive ETCO2 and breath sounds checked- equal and bilateral Secured at: 23 cm Tube secured with: Tape Dental Injury: Teeth and Oropharynx as per pre-operative assessment  Comments: Smooth IV induction Finucane- intubation AM CRNA - atraumatic- teeth and mouth as preop- many missing upper teeth- plate removed by pt and pt gave to Endo RN- bilat BS [employer]

## 2020-09-24 DIAGNOSIS — K831 Obstruction of bile duct: Secondary | ICD-10-CM | POA: Diagnosis not present

## 2020-09-24 LAB — CBC WITH DIFFERENTIAL/PLATELET
Abs Immature Granulocytes: 0.04 10*3/uL (ref 0.00–0.07)
Basophils Absolute: 0 10*3/uL (ref 0.0–0.1)
Basophils Relative: 1 %
Eosinophils Absolute: 0.1 10*3/uL (ref 0.0–0.5)
Eosinophils Relative: 1 %
HCT: 39.1 % (ref 39.0–52.0)
Hemoglobin: 13.5 g/dL (ref 13.0–17.0)
Immature Granulocytes: 1 %
Lymphocytes Relative: 23 %
Lymphs Abs: 1.9 10*3/uL (ref 0.7–4.0)
MCH: 33.3 pg (ref 26.0–34.0)
MCHC: 34.5 g/dL (ref 30.0–36.0)
MCV: 96.3 fL (ref 80.0–100.0)
Monocytes Absolute: 0.5 10*3/uL (ref 0.1–1.0)
Monocytes Relative: 6 %
Neutro Abs: 5.8 10*3/uL (ref 1.7–7.7)
Neutrophils Relative %: 68 %
Platelets: 208 10*3/uL (ref 150–400)
RBC: 4.06 MIL/uL — ABNORMAL LOW (ref 4.22–5.81)
RDW: 17 % — ABNORMAL HIGH (ref 11.5–15.5)
WBC: 8.4 10*3/uL (ref 4.0–10.5)
nRBC: 0 % (ref 0.0–0.2)

## 2020-09-24 LAB — GLUCOSE, CAPILLARY
Glucose-Capillary: 201 mg/dL — ABNORMAL HIGH (ref 70–99)
Glucose-Capillary: 82 mg/dL (ref 70–99)
Glucose-Capillary: 93 mg/dL (ref 70–99)

## 2020-09-24 LAB — COMPREHENSIVE METABOLIC PANEL
ALT: 105 U/L — ABNORMAL HIGH (ref 0–44)
AST: 54 U/L — ABNORMAL HIGH (ref 15–41)
Albumin: 3 g/dL — ABNORMAL LOW (ref 3.5–5.0)
Alkaline Phosphatase: 416 U/L — ABNORMAL HIGH (ref 38–126)
Anion gap: 10 (ref 5–15)
BUN: 13 mg/dL (ref 8–23)
CO2: 27 mmol/L (ref 22–32)
Calcium: 8.9 mg/dL (ref 8.9–10.3)
Chloride: 99 mmol/L (ref 98–111)
Creatinine, Ser: 0.53 mg/dL — ABNORMAL LOW (ref 0.61–1.24)
GFR, Estimated: >60 mL/min (ref >60)
Glucose, Bld: 84 mg/dL (ref 70–99)
Potassium: 3.6 mmol/L (ref 3.5–5.1)
Sodium: 136 mmol/L (ref 135–145)
Total Bilirubin: 6.3 mg/dL — ABNORMAL HIGH (ref 0.3–1.2)
Total Protein: 5.9 g/dL — ABNORMAL LOW (ref 6.5–8.1)

## 2020-09-24 MED ORDER — NIRMATRELVIR/RITONAVIR (PAXLOVID)TABLET: 3.0000 | ORAL_TABLET | Freq: Two times a day (BID) | ORAL | 0 refills | Status: AC

## 2020-09-24 NOTE — Discharge Summary (Signed)
Physician Discharge Summary  [name] [name] [id number] DOB: 16-Mar-1955 DOA: 09/22/2020  PCP: Hoyt Koch, MD  Admit date: 09/22/2020 Discharge date: 09/24/2020 30 Day Unplanned Readmission Risk Score    Flowsheet Row ED to Hosp-Admission (Current) from 09/22/2020 in [place]  30 Day Unplanned Readmission Risk Score (%) 7.01 Filed at 09/24/2020 0800       This score is the patient's risk of an unplanned readmission within 30 days of being discharged (0 -100%). The score is based on dignosis, age, lab data, medications, orders, and past utilization.   Low:  0-14.9   Medium: 15-21.9   High: 22-29.9   Extreme: 30 and above          Admitted From: Home Disposition: All  Recommendations for Outpatient Follow-up:  Follow up with PCP in 1-2 weeks Follow-up with GI for follow biopsy results in next 2 to 3 days Please obtain BMP/CBC in one week Please follow up with your PCP on the following pending results: Unresulted Labs (From admission, onward)     Start     Ordered   09/24/20 0500  CBC with Differential/Platelet  Daily,   R      09/23/20 1026   09/24/20 0500  Comprehensive metabolic panel  Daily,   R      09/23/20 [place]: None Equipment/Devices: None  Discharge Condition: Stable CODE STATUS: Full code Diet recommendation: Cardiac  Subjective: Seen and examined.  Feels much better.  Walking around in the room.  No abdominal pain or abdominal tenderness on examination and tolerated breakfast with regular diet as well.  Wants to go home.  Following HPI and ED course is copied from my colleague hospitalist Dr. Serita Grit H&P. HPI: [name] [name] is a 65 y.o. male with medical history significant for COPD with interstitial pulmonary fibrosis, type 2 diabetes, depression/anxiety, alcohol use disorder, and OSA on CPAP who presented to the ED for evaluation of abdominal pain.   Patient states that 1 week  ago he developed sudden onset of severe lower abdominal pain.  He says he and his family developed "stomach bug" type symptoms.  He was experiencing some urgency for bowel movement but had scant loose stools.  He says his family's symptoms resolved after short period of time however his continued.  This morning he noticed that his skin and eyes appeared to be yellow.  He had a telemedicine visit with his primary care and was advised to present to the ED for further management.  Patient reports a history of alcohol use but has been sober since undergoing a detox program in October 2021.   ED Course:  Initial vitals showed BP 130/78, pulse 73, RR 18, temp 98.7 F, SPO2 94% on room air.   Labs significant for total bilirubin 15.3, AST 110, ALT 148, alkaline phosphatase 475, sodium 135, potassium 3.8, bicarb 28, BUN 13, creatinine 0.71, serum glucose 109, WBC 9.4, hemoglobin 13.7, platelets 219,000, CK 63.  Acetaminophen level <10, INR 0.9.  Acute hepatitis panel in process.   SARS-CoV-2 PCR is positive.  Influenza A/B PCR's are negative.   RUQ ultrasound showed intrahepatic biliary ductal dilatation and dilated proximal common bile duct consistent with biliary obstruction.   CT abdomen/pelvis with contrast again showed findings consistent with biliary obstruction possibly due to common bile duct mass.   Patient was given 500 cc normal saline.  EDP  discussed with on-call GI who recommended admission and MRCP.  The hospitalist service was consulted to admit for further evaluation and management.    Brief/Interim Summary: In short, patient was admitted due to biliary obstruction, elevated LFTs and hyperbilirubinemia and was thought to be having soft tissue mass in proximal CBD.  GI was consulted.  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  Moderately dilated left and right hepatic ducts and all intrahepatic branches and CBD.  Biliary  sphincterotomy was performed.  Biopsy was taken and cells were sent for cytology.  Postprocedure, he was started on regular diet which he tolerated well.  His LFTs and bilirubin has improved significantly since yesterday.  GI has cleared the patient for discharge.  He is completely asymptomatic with no abdominal tenderness and has tolerated diet and wants to go home so he will be discharged.  GI will follow up with him with the results of the biopsy.  Of note, patient was also tested positive incidentally for COVID.  He is being discharged on 5 days of paxlovid.  Discharge Diagnoses:  Principal Problem:   Biliary obstruction Active Problems:   Obstructive sleep apnea   Hyperlipidemia associated with type 2 diabetes mellitus ([place])   COPD mixed type ([facility])   Alcohol dependence in remission ([place])   Diabetes mellitus type 2 with complications ([place])   Interstitial pulmonary fibrosis ([place])    Discharge Instructions   Allergies as of 09/24/2020   No Known Allergies      Medication List     STOP taking these medications    rosuvastatin 5 MG tablet Commonly known as: CRESTOR       TAKE these medications    albuterol 108 (90 Base) MCG/ACT inhaler Commonly known as: VENTOLIN HFA Inhale 2 puffs into the lungs every 6 (six) hours as needed for wheezing or shortness of breath.   aspirin EC 81 MG tablet Take 81 mg by mouth daily. Swallow whole.   busPIRone 5 MG tablet Commonly known as: BUSPAR Take 5 mg by mouth at bedtime.   CHROMIUM PO Take 1 tablet by mouth daily.   clonazePAM 0.5 MG tablet Commonly known as: KLONOPIN TAKE 1 TABLET BY MOUTH TWICE DAILY AS NEEDED What changed:  how much to take how to take this when to take this reasons to take this additional instructions   doxylamine (Sleep) 25 MG tablet Commonly known as: UNISOM Take 25 mg by mouth at bedtime as needed for sleep.   folic acid 1 MG tablet Commonly known as: FOLVITE Take 1 mg by mouth daily.    magnesium oxide 400 (240 Mg) MG tablet Commonly known as: MAG-OX Take 400 mg by mouth daily.   metFORMIN 1000 MG tablet Commonly known as: GLUCOPHAGE TAKE 1 TABLET(1000 MG) BY MOUTH TWICE DAILY WITH A MEAL What changed: See the new instructions.   multivitamin with minerals Tabs tablet Take 1 tablet by mouth daily.   nirmatrelvir/ritonavir EUA Tabs Commonly known as: PAXLOVID Take 3 tablets by mouth 2 (two) times daily for 5 days. Patient GFR is >60. Take nirmatrelvir (150 mg) two tablets twice daily for 5 days and ritonavir (100 mg) one tablet twice daily for 5 days.   PARoxetine 30 MG tablet Commonly known as: PAXIL Take 30 mg by mouth at bedtime.   QUEtiapine 50 MG tablet Commonly known as: SEROQUEL Take 50 mg by mouth at bedtime.   tiaGABine 4 MG tablet Commonly known as: GABITRIL  Take 8 mg by mouth at bedtime.   Vascepa 1 g capsule Generic drug: icosapent Ethyl TAKE 2 CAPSULES(2 GRAMS) BY MOUTH TWICE DAILY What changed: See the new instructions.   vitamin B-12 500 MCG tablet Commonly known as: CYANOCOBALAMIN Take 500 mcg by mouth daily before supper.   vitamin C 1000 MG tablet Take 1,000 mg by mouth daily.   Vitamin D 50 MCG (2000 UT) tablet Take 2,000 Units by mouth daily.   Vivitrol 380 MG Susr Generic drug: Naltrexone Inject 380 mg into the muscle every 30 (thirty) days.        Follow-up Information     Hoyt Koch, MD Follow up in 1 week(s).   Specialty: Internal Medicine Contact information: [place] [address] [phone]         Carol Ada, MD Follow up in 1 week(s).   Specialty: Gastroenterology Contact information: [place], [place] [address] [phone]                No Known Allergies  Consultations: GI   Procedures/Studies: CT ABDOMEN PELVIS W CONTRAST  Result Date: 09/22/2020 CLINICAL DATA:  Nonlocalized acute abdominal pain. Biliary obstruction or  mass. EXAM: CT ABDOMEN AND PELVIS WITH CONTRAST TECHNIQUE: Multidetector CT imaging of the abdomen and pelvis was performed using the standard protocol following bolus administration of intravenous contrast. CONTRAST:  139m OMNIPAQUE IOHEXOL 300 MG/ML  SOLN COMPARISON:  Ultrasound abdomen 09/22/2020. FINDINGS: Lower chest: Bibasilar reticulations. Coronary artery calcifications. Hepatobiliary: No focal liver abnormality. Homogeneous hyperdensity fills the gallbladder lumen. Intra and extrahepatic biliary ductal dilatation. Pancreas: No focal lesion. Normal pancreatic contour. No surrounding inflammatory changes. No main pancreatic ductal dilatation. Spleen: Normal in size without focal abnormality. Adrenals/Urinary Tract: No adrenal nodule bilaterally. Bilateral kidneys enhance symmetrically. No hydronephrosis. No hydroureter. The urinary bladder is unremarkable. Stomach/Bowel: Stomach is within normal limits. No evidence of bowel wall thickening or dilatation. Under distension of the rectum. Perirectal fat stranding. The appendix not definitely identified. Vascular/Lymphatic: No abdominal aorta or iliac aneurysm. Severe atherosclerotic plaque of the aorta and its branches. No abdominal, pelvic, or inguinal lymphadenopathy. Reproductive: Prostate is unremarkable. Other: No intraperitoneal free fluid. No intraperitoneal free gas. No organized fluid collection. Musculoskeletal: No abdominal wall hernia or abnormality. No suspicious lytic or blastic osseous lesions. No acute displaced fracture. Multilevel degenerative changes of the spine. IMPRESSION: 1. Question bibasilar pulmonary fibrosis. 2. Homogeneous hyperdensity fills the gallbladder lumen - likely represents gallbladder sludge. Intra and extrahepatic biliary ductal dilatation with no main pancreatic duct dilatation. Findings are consistent with biliary obstruction, possibly due to a common bile duct mass which is incompletely evaluated on this single phase  portal venous study. Recommend MRCP for further evaluation. 3. Nonspecific perirectal fat stranding with under distension of the rectum. 4. Aortic Atherosclerosis (ICD10-I70.0). Electronically Signed   By: MIven FinnM.D.   On: 09/22/2020 20:20   MR 3D Recon At Scanner  Result Date: 09/23/2020 CLINICAL DATA:  65year old male with history of jaundice. EXAM: MRI ABDOMEN WITHOUT AND WITH CONTRAST (INCLUDING MRCP) TECHNIQUE: Multiplanar multisequence MR imaging of the abdomen was performed both before and after the administration of intravenous contrast. Heavily T2-weighted images of the biliary and pancreatic ducts were obtained, and three-dimensional MRCP images were rendered by post processing. CONTRAST:  844mGADAVIST GADOBUTROL 1 MMOL/ML IV SOLN COMPARISON:  CT the abdomen and pelvis 09/22/2020. FINDINGS: Comment: Portions of today's examination are severely limited by artifact from a large amount of patient  respiratory motion. Lower chest: Unremarkable. Hepatobiliary: No suspicious cystic or solid hepatic lesions. However, MRCP images demonstrate moderate to severe intra hepatic biliary ductal dilatation. The ductal dilatation appears to abruptly terminate in the proximal common bile duct at or near the junction with the cystic duct (which is very poorly visualized on today's motion limited examination). The source of obstruction is poorly demonstrated, but appears to be a band like area of soft tissue encasing the duct, best appreciated on axial image 20 of series 5 where this is estimated to measure approximately 1.2 x 1.8 cm, and corresponds to areas of enhancement on post gadolinium imaging. Distal common bile duct is completely decompressed. No filling defects are noted in the distal common bile duct to suggest choledocholithiasis. Gallbladder is moderately distended with T1 hyperintense mildly T2 hyperintense material, which likely represents biliary sludge. Pancreas: No pancreatic mass. No  pancreatic ductal dilatation noted on MRCP images. No pancreatic or peripancreatic fluid collections or inflammatory changes. Spleen:  Unremarkable. Adrenals/Urinary Tract: Subcentimeter T1 hypointense, T2 hyperintense, nonenhancing lesions in both kidneys are compatible with simple cysts. No hydroureteronephrosis in the visualized portions of the abdomen. Bilateral adrenal glands are normal in appearance. Stomach/Bowel: Visualized portions are unremarkable. Vascular/Lymphatic: No aneurysm identified in the visualized abdominal vasculature. No lymphadenopathy. Other: No significant volume of ascites noted in the visualized portions of the peritoneal cavity. Musculoskeletal: No aggressive appearing osseous lesions are noted in the visualized portions of the skeleton. IMPRESSION: 1. Obstructing soft tissue mass in the proximal common bile duct at or adjacent to the junction with the cystic duct, associated with moderate to severe intrahepatic biliary ductal dilatation indicating obstruction, as discussed above. 2. Biliary sludge filling the gallbladder lumen. Electronically Signed   By: Vinnie Langton M.D.   On: 09/23/2020 06:27   DG ERCP BILIARY & PANCREATIC DUCTS  Result Date: 09/24/2020 CLINICAL DATA:  Biliary obstruction. EXAM: ERCP TECHNIQUE: Multiple spot images obtained with the fluoroscopic device and submitted for interpretation post-procedure. FLUOROSCOPY TIME:  Fluoroscopy Time:  2 minutes, 5 seconds COMPARISON:  MRI 09/23/2020 FINDINGS: Retrograde cholangiogram demonstrates irregularity and filling defects in the proximal common bile duct region/common hepatic duct region. Wire was advanced into the intrahepatic biliary system. A nonmetallic biliary stent was placed across the area of narrowing and irregularity. IMPRESSION: Irregular biliary stricture involving the proximal common bile duct region. This is concerning for a malignant stricture based on the cholangiogram appearance and previous  cross-sectional imaging. These images were submitted for radiologic interpretation only. Please see the procedural report for the amount of contrast and the fluoroscopy time utilized. Electronically Signed   By: Markus Daft M.D.   On: 09/24/2020 08:43   MR ABDOMEN MRCP W WO CONTAST  Result Date: 09/23/2020 CLINICAL DATA:  65 year old male with history of jaundice. EXAM: MRI ABDOMEN WITHOUT AND WITH CONTRAST (INCLUDING MRCP) TECHNIQUE: Multiplanar multisequence MR imaging of the abdomen was performed both before and after the administration of intravenous contrast. Heavily T2-weighted images of the biliary and pancreatic ducts were obtained, and three-dimensional MRCP images were rendered by post processing. CONTRAST:  55m GADAVIST GADOBUTROL 1 MMOL/ML IV SOLN COMPARISON:  CT the abdomen and pelvis 09/22/2020. FINDINGS: Comment: Portions of today's examination are severely limited by artifact from a large amount of patient respiratory motion. Lower chest: Unremarkable. Hepatobiliary: No suspicious cystic or solid hepatic lesions. However, MRCP images demonstrate moderate to severe intra hepatic biliary ductal dilatation. The ductal dilatation appears to abruptly terminate in the proximal common bile duct at or  near the junction with the cystic duct (which is very poorly visualized on today's motion limited examination). The source of obstruction is poorly demonstrated, but appears to be a band like area of soft tissue encasing the duct, best appreciated on axial image 20 of series 5 where this is estimated to measure approximately 1.2 x 1.8 cm, and corresponds to areas of enhancement on post gadolinium imaging. Distal common bile duct is completely decompressed. No filling defects are noted in the distal common bile duct to suggest choledocholithiasis. Gallbladder is moderately distended with T1 hyperintense mildly T2 hyperintense material, which likely represents biliary sludge. Pancreas: No pancreatic mass. No  pancreatic ductal dilatation noted on MRCP images. No pancreatic or peripancreatic fluid collections or inflammatory changes. Spleen:  Unremarkable. Adrenals/Urinary Tract: Subcentimeter T1 hypointense, T2 hyperintense, nonenhancing lesions in both kidneys are compatible with simple cysts. No hydroureteronephrosis in the visualized portions of the abdomen. Bilateral adrenal glands are normal in appearance. Stomach/Bowel: Visualized portions are unremarkable. Vascular/Lymphatic: No aneurysm identified in the visualized abdominal vasculature. No lymphadenopathy. Other: No significant volume of ascites noted in the visualized portions of the peritoneal cavity. Musculoskeletal: No aggressive appearing osseous lesions are noted in the visualized portions of the skeleton. IMPRESSION: 1. Obstructing soft tissue mass in the proximal common bile duct at or adjacent to the junction with the cystic duct, associated with moderate to severe intrahepatic biliary ductal dilatation indicating obstruction, as discussed above. 2. Biliary sludge filling the gallbladder lumen. Electronically Signed   By: Vinnie Langton M.D.   On: 09/23/2020 06:27   US Abdomen Limited RUQ (LIVER/GB)  Result Date: 09/22/2020 CLINICAL DATA:  Hyperbilirubinemia EXAM: ULTRASOUND ABDOMEN LIMITED RIGHT UPPER QUADRANT COMPARISON:  Ultrasound 04/11/2017 FINDINGS: Gallbladder: The gallbladder is mildly distended, filled with material of varying echogenicity, likely sludge and tiny stones. No pericholecystic fluid or wall thickening. Sonographic Murphy's was not determined by the sonographer. Common bile duct: Diameter: Measures up to 11 mm proximally. Liver: There is intrahepatic biliary ductal dilation. Coarsened liver parenchyma in somewhat nodular left hepatic lobe. Portal vein is patent on color Doppler imaging with normal direction of blood flow towards the liver. Other: None. IMPRESSION: Intrahepatic biliary ductal dilation and dilated proximal common  bile duct. Findings are consistent with biliary obstruction, possibly due to a common bile duct mass, incompletely evaluated by ultrasound. Recommend multiphase CT or MRCP for further evaluation. Mildly distended gallbladder filled with material of varying echogenicity, likely sludge and tiny stones. In the absence of leukocytosis and right upper quadrant pain this is unlikely to represent cholecystitis. Coarsened liver echogenicity with somewhat nodular left hepatic lobe, compatible with chronic liver disease. Electronically Signed   By: Maurine Simmering   On: 09/22/2020 19:26     Discharge Exam: Vitals:   09/23/20 2051 09/24/20 0421  BP:  131/80  Pulse: 68 (!) 59  Resp: 17 18  Temp:  98.5 F (36.9 C)  SpO2: 97% 100%   Vitals:   09/23/20 1812 09/23/20 2004 09/23/20 2051 09/24/20 0421  BP: 119/82 121/74  131/80  Pulse: 62 (!) 53 68 (!) 59  Resp: '18 16 17 18  '$ Temp: 98.6 F (37 C) 97.6 F (36.4 C)  98.5 F (36.9 C)  TempSrc: Oral Oral  Oral  SpO2: 98% 98% 97% 100%  Weight:      Height:        General: Pt is alert, awake, not in acute distress Cardiovascular: RRR, S1/S2 +, no rubs, no gallops Respiratory: CTA bilaterally, no wheezing, no rhonchi  Abdominal: Soft, NT, ND, bowel sounds + Extremities: no edema, no cyanosis    The results of significant diagnostics from this hospitalization (including imaging, microbiology, ancillary and laboratory) are listed below for reference.     Microbiology: Recent Results (from the past 240 hour(s))  Resp Panel by RT-PCR (Flu A&B, Covid) Nasopharyngeal Swab     Status: Abnormal   Collection Time: 09/22/20  6:25 PM   Specimen: Nasopharyngeal Swab; Nasopharyngeal(NP) swabs in vial transport medium  Result Value Ref Range Status   SARS Coronavirus 2 by RT PCR POSITIVE (A) NEGATIVE Final    Comment: RESULT CALLED TO, READ BACK BY AND VERIFIED WITH: KELLIE NEAL RN '@1950'$  09/22/2020 OLSONM (NOTE) SARS-CoV-2 target nucleic acids are  DETECTED.  The SARS-CoV-2 RNA is generally detectable in upper respiratory specimens during the acute phase of infection. Positive results are indicative of the presence of the identified virus, but do not rule out bacterial infection or co-infection with other pathogens not detected by the test. Clinical correlation with patient history and other diagnostic information is necessary to determine patient infection status. The expected result is Negative.  Fact Sheet for Patients: EntrepreneurPulse.com.au  Fact Sheet for Healthcare Providers: IncredibleEmployment.be  This test is not yet approved or cleared by the [place] FDA and  has been authorized for detection and/or diagnosis of SARS-CoV-2 by FDA under an Emergency Use Authorization (EUA).  This EUA will remain in effect (meaning this test c an be used) for the duration of  the COVID-19 declaration under Section 564(b)(1) of the Act, 21 U.S.C. section 360bbb-3(b)(1), unless the authorization is terminated or revoked sooner.     Influenza A by PCR NEGATIVE NEGATIVE Final   Influenza B by PCR NEGATIVE NEGATIVE Final    Comment: (NOTE) The Xpert Xpress SARS-CoV-2/FLU/RSV plus assay is intended as an aid in the diagnosis of influenza from Nasopharyngeal swab specimens and should not be used as a sole basis for treatment. Nasal washings and aspirates are unacceptable for Xpert Xpress SARS-CoV-2/FLU/RSV testing.  Fact Sheet for Patients: EntrepreneurPulse.com.au  Fact Sheet for Healthcare Providers: IncredibleEmployment.be  This test is not yet approved or cleared by the [place] FDA and has been authorized for detection and/or diagnosis of SARS-CoV-2 by FDA under an Emergency Use Authorization (EUA). This EUA will remain in effect (meaning this test can be used) for the duration of the COVID-19 declaration under Section 564(b)(1) of the Act, 21  U.S.C. section 360bbb-3(b)(1), unless the authorization is terminated or revoked.  Performed at [facility], [address] [place], [place] [address]      Labs: BNP (last 3 results) No results for input(s): BNP in the last 8760 hours. Basic Metabolic Panel: Recent Labs  Lab 09/22/20 1733 09/23/20 0341 09/24/20 0428  NA 135 135 136  K 3.8 3.5 3.6  CL 98 103 99  CO2 '28 25 27  '$ GLUCOSE 109* 87 84  BUN '13 14 13  '$ CREATININE 0.71 <0.30* 0.53*  CALCIUM 8.6* 8.7* 8.9   Liver Function Tests: Recent Labs  Lab 09/22/20 1733 09/23/20 0341 09/24/20 0428  AST 110* 85* 54*  ALT 148* 128* 105*  ALKPHOS 475* 451* 416*  BILITOT 15.3* 14.6* 6.3*  PROT 6.3* 5.8* 5.9*  ALBUMIN 3.2* 2.8* 3.0*   Recent Labs  Lab 09/22/20 1733  LIPASE 19   No results for input(s): AMMONIA in the last 168 hours. CBC: Recent Labs  Lab 09/22/20 1733 09/23/20 0341 09/24/20 0428  WBC 9.4 8.4 8.4  NEUTROABS  --   --  5.8  HGB 13.7 13.1 13.5  HCT 38.8* 37.4* 39.1  MCV 96.3 97.4 96.3  PLT 219 191 208   Cardiac Enzymes: Recent Labs  Lab 09/22/20 1733  CKTOTAL 63   BNP: Invalid input(s): POCBNP CBG: Recent Labs  Lab 09/23/20 1810 09/23/20 2002 09/24/20 0002 09/24/20 0426 09/24/20 0806  GLUCAP 122* 130* 201* 93 82   D-Dimer No results for input(s): DDIMER in the last 72 hours. Hgb A1c Recent Labs    09/23/20 0341  HGBA1C 5.0   Lipid Profile No results for input(s): CHOL, HDL, LDLCALC, TRIG, CHOLHDL, LDLDIRECT in the last 72 hours. Thyroid function studies No results for input(s): TSH, T4TOTAL, T3FREE, THYROIDAB in the last 72 hours.  Invalid input(s): FREET3 Anemia work up No results for input(s): VITAMINB12, FOLATE, FERRITIN, TIBC, IRON, RETICCTPCT in the last 72 hours. Urinalysis    Component Value Date/Time   COLORURINE ORANGE (A) 09/22/2020 1734   APPEARANCEUR CLOUDY (A) 09/22/2020 1734   LABSPEC 1.025 09/22/2020 1734   PHURINE 6.5 09/22/2020 1734    GLUCOSEU 100 (A) 09/22/2020 1734   HGBUR NEGATIVE 09/22/2020 1734   BILIRUBINUR LARGE (A) 09/22/2020 1734   BILIRUBINUR Neg 04/22/2018 1040   KETONESUR NEGATIVE 09/22/2020 1734   PROTEINUR NEGATIVE 09/22/2020 1734   UROBILINOGEN 0.2 04/22/2018 1040   UROBILINOGEN 0.2 09/27/2012 0625   NITRITE NEGATIVE 09/22/2020 1734   LEUKOCYTESUR NEGATIVE 09/22/2020 1734   Sepsis Labs Invalid input(s): PROCALCITONIN,  WBC,  LACTICIDVEN Microbiology Recent Results (from the past 240 hour(s))  Resp Panel by RT-PCR (Flu A&B, Covid) Nasopharyngeal Swab     Status: Abnormal   Collection Time: 09/22/20  6:25 PM   Specimen: Nasopharyngeal Swab; Nasopharyngeal(NP) swabs in vial transport medium  Result Value Ref Range Status   SARS Coronavirus 2 by RT PCR POSITIVE (A) NEGATIVE Final    Comment: RESULT CALLED TO, READ BACK BY AND VERIFIED WITH: KELLIE NEAL RN '@1950'$  09/22/2020 OLSONM (NOTE) SARS-CoV-2 target nucleic acids are DETECTED.  The SARS-CoV-2 RNA is generally detectable in upper respiratory specimens during the acute phase of infection. Positive results are indicative of the presence of the identified virus, but do not rule out bacterial infection or co-infection with other pathogens not detected by the test. Clinical correlation with patient history and other diagnostic information is necessary to determine patient infection status. The expected result is Negative.  Fact Sheet for Patients: EntrepreneurPulse.com.au  Fact Sheet for Healthcare Providers: IncredibleEmployment.be  This test is not yet approved or cleared by the [place] FDA and  has been authorized for detection and/or diagnosis of SARS-CoV-2 by FDA under an Emergency Use Authorization (EUA).  This EUA will remain in effect (meaning this test c an be used) for the duration of  the COVID-19 declaration under Section 564(b)(1) of the Act, 21 U.S.C. section 360bbb-3(b)(1), unless the  authorization is terminated or revoked sooner.     Influenza A by PCR NEGATIVE NEGATIVE Final   Influenza B by PCR NEGATIVE NEGATIVE Final    Comment: (NOTE) The Xpert Xpress SARS-CoV-2/FLU/RSV plus assay is intended as an aid in the diagnosis of influenza from Nasopharyngeal swab specimens and should not be used as a sole basis for treatment. Nasal washings and aspirates are unacceptable for Xpert Xpress SARS-CoV-2/FLU/RSV testing.  Fact Sheet for Patients: EntrepreneurPulse.com.au  Fact Sheet for Healthcare Providers: IncredibleEmployment.be  This test is not yet approved or cleared by the [place] FDA and has been authorized for detection and/or diagnosis  of SARS-CoV-2 by FDA under an Emergency Use Authorization (EUA). This EUA will remain in effect (meaning this test can be used) for the duration of the COVID-19 declaration under Section 564(b)(1) of the Act, 21 U.S.C. section 360bbb-3(b)(1), unless the authorization is terminated or revoked.  Performed at [facility], [address] [place], [place] [address]      Time coordinating discharge: Over 30 minutes  SIGNED:   Darliss Cheney, MD  [facility] 09/24/2020, 9:42 AM  If 7PM-7AM, please contact night-coverage [email or web address]

## 2020-09-24 NOTE — Plan of Care (Signed)
  Problem: Education: Goal: Knowledge of risk factors and measures for prevention of condition will improve Outcome: Progressing   Problem: Respiratory: Goal: Will maintain a patent airway Outcome: Progressing Goal: Complications related to the disease process, condition or treatment will be avoided or minimized Outcome: Progressing   Problem: Health Behavior/Discharge Planning: Goal: Ability to manage health-related needs will improve Outcome: Progressing   Problem: Clinical Measurements: Goal: Ability to maintain clinical measurements within normal limits will improve Outcome: Progressing Goal: Diagnostic test results will improve Outcome: Progressing   Problem: Activity: Goal: Risk for activity intolerance will decrease Outcome: Progressing   Problem: Pain Managment: Goal: General experience of comfort will improve Outcome: Progressing   Problem: Safety: Goal: Ability to remain free from injury will improve Outcome: Progressing   Problem: Skin Integrity: Goal: Risk for impaired skin integrity will decrease Outcome: Progressing

## 2020-09-24 NOTE — Progress Notes (Signed)
AVS given to patient and explained at the bedside. Medications and follow up appointments have been explained with pt verbalizing understanding.  

## 2020-09-27 ENCOUNTER — Encounter (HOSPITAL_COMMUNITY): Payer: Self-pay | Admitting: Gastroenterology

## 2020-09-27 ENCOUNTER — Telehealth: Payer: Self-pay

## 2020-09-27 NOTE — Telephone Encounter (Signed)
Transition Care Management Follow-up Telephone Call Date of discharge and from where: 09/24/2020 from [facility] How have you been since you were released from the hospital? Feeling okay; no BM, + gas, no nausea/vomiting; appetite good Any questions or concerns? No  Items Reviewed: Did the pt receive and understand the discharge instructions provided? Yes  Medications obtained and verified? Yes  Other? No  Any new allergies since your discharge? No  Dietary orders reviewed? Yes, cardiac diet Do you have support at home? Yes , wife  Home Care and Equipment/Supplies: Were home health services ordered? no If so, what is the name of the agency? no  Has the agency set up a time to come to the patient's home? not applicable Were any new equipment or medical supplies ordered?  No What is the name of the medical supply agency? No Were you able to get the supplies/equipment? not applicable Do you have any questions related to the use of the equipment or supplies? No  Functional Questionnaire: (I = Independent and D = Dependent) ADLs: I  Bathing/Dressing- I  Meal Prep- I  Eating- I  Maintaining continence- I  Transferring/Ambulation- I  Managing Meds- I  Follow up appointments reviewed:  PCP Hospital f/u appt confirmed? Yes  Scheduled to see Pricilla Holm, MD on 09/30/2020 @ 3:20 pm. [facility] f/u appt confirmed? Yes  Scheduled to see Carol Ada, MD  Are transportation arrangements needed? No  If their condition worsens, is the pt aware to call PCP or go to the Emergency Dept.? Yes Was the patient provided with contact information for the PCP's office or ED? Yes Was to pt encouraged to call back with questions or concerns? Yes

## 2020-09-28 LAB — CYTOLOGY - NON PAP

## 2020-09-29 ENCOUNTER — Telehealth: Payer: Self-pay | Admitting: Radiation Oncology

## 2020-09-29 ENCOUNTER — Other Ambulatory Visit: Payer: Self-pay

## 2020-09-29 DIAGNOSIS — C249 Malignant neoplasm of biliary tract, unspecified: Secondary | ICD-10-CM

## 2020-09-29 NOTE — Telephone Encounter (Signed)
Called patient to schedule consultation with Dr. Moody. No answer, LVM for return call. 

## 2020-09-30 ENCOUNTER — Inpatient Hospital Stay: Payer: Federal, State, Local not specified - PPO | Admitting: Internal Medicine

## 2020-09-30 NOTE — Progress Notes (Signed)
I spoke with [name] [name].  I reviewed appt with Dr Burr Medico on 8/22 at 1520.  I briefly explained my role.  She confirmed the appt.

## 2020-10-04 NOTE — Progress Notes (Signed)
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  He complains of diarrhea, headaches, weakness, abdominal discomfort for about a week, and then noted some jaundice a day prior to hospital admit.  He states he and his family developed  "stomach bug" type symptoms, his symptoms did not resolve.  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  MRI Abdomen 09/23/2020: Obstructing soft tissue mass in the proximal common bile duct at or adjacent to the junction with the cystic duct, associated with moderate to severe intrahepatic biliary ductal dilatation indicating obstruction, as discussed above.  US abdomen 09/22/2020: Intrahepatic biliary ductal dilation and dilated proximal common bile duct. Findings are consistent with biliary obstruction, possibly due to a common bile duct mass, incompletely evaluated by ultrasound.   Biopsies of Biliary Mass 09/23/2020     Past/Anticipated interventions by surgeon, if any:   Past/Anticipated interventions by medical oncology, if any:  Dr. Burr Medico 10/10/2020   Weight changes, if any: Lost about 85 pounds since October, conscious effort.  He changed his diet, and added in more exercise.  Bowel/Bladder complaints, if any: Has bowel irregularity.  Notes he will go about every 5 days.  Does not feel constipated.  This has been going on for about 5-6 months.  Feels bloated and gassy.  He notes his urine is cloudy, denies pain or burning with urination.  Has some hesitancy on occasion, seen by Urology.  Nausea / Vomiting, if any: No  Pain issues, if any:  Notes some pressure/mild discomfort in his abdomen daily, denies pain.  Notes some tenderness in his abdomen over his liver.  Skin: No.  Notes some swelling in his feet  and ankles.  Notes increased fatigue about mid day to early evening.  He states he feels like he has a manic high in the morning.  Diagnosed with bipolar early in life.  States it has been years since he experienced this.       SAFETY ISSUES: Prior radiation? No Pacemaker/ICD? No Possible current pregnancy? N/a Is the patient on methotrexate? No  Current Complaints/Details: -States he is having some difficulty with his balance for about a month or so.  Denies any dizziness.

## 2020-10-05 ENCOUNTER — Other Ambulatory Visit: Payer: Self-pay | Admitting: Surgery

## 2020-10-05 ENCOUNTER — Other Ambulatory Visit (HOSPITAL_COMMUNITY): Payer: Self-pay | Admitting: Surgery

## 2020-10-05 DIAGNOSIS — C240 Malignant neoplasm of extrahepatic bile duct: Secondary | ICD-10-CM

## 2020-10-06 ENCOUNTER — Ambulatory Visit
Admission: RE | Admit: 2020-10-06 | Discharge: 2020-10-06 | Disposition: A | Discharge status: Home / Self Care | Payer: Federal, State, Local not specified - PPO | Source: Ambulatory Visit | Attending: Radiation Oncology | Admitting: Radiation Oncology

## 2020-10-06 ENCOUNTER — Telehealth: Payer: Self-pay | Admitting: *Deleted

## 2020-10-06 ENCOUNTER — Encounter: Payer: Self-pay | Admitting: Radiation Oncology

## 2020-10-06 ENCOUNTER — Other Ambulatory Visit: Payer: Self-pay

## 2020-10-06 VITALS — BP 137/79 | HR 82 | Temp 97.2°F | Resp 18 | Ht 70.0 in | Wt 187.5 lb

## 2020-10-06 DIAGNOSIS — F17210 Nicotine dependence, cigarettes, uncomplicated: Secondary | ICD-10-CM | POA: Diagnosis not present

## 2020-10-06 DIAGNOSIS — I700 Atherosclerosis of aorta: Secondary | ICD-10-CM | POA: Insufficient documentation

## 2020-10-06 DIAGNOSIS — Z7984 Long term (current) use of oral hypoglycemic drugs: Secondary | ICD-10-CM | POA: Insufficient documentation

## 2020-10-06 DIAGNOSIS — Z79899 Other long term (current) drug therapy: Secondary | ICD-10-CM | POA: Insufficient documentation

## 2020-10-06 DIAGNOSIS — J449 Chronic obstructive pulmonary disease, unspecified: Secondary | ICD-10-CM | POA: Diagnosis not present

## 2020-10-06 DIAGNOSIS — C221 Intrahepatic bile duct carcinoma: Secondary | ICD-10-CM

## 2020-10-06 DIAGNOSIS — G4730 Sleep apnea, unspecified: Secondary | ICD-10-CM | POA: Insufficient documentation

## 2020-10-06 DIAGNOSIS — C249 Malignant neoplasm of biliary tract, unspecified: Secondary | ICD-10-CM

## 2020-10-06 DIAGNOSIS — E119 Type 2 diabetes mellitus without complications: Secondary | ICD-10-CM | POA: Insufficient documentation

## 2020-10-06 DIAGNOSIS — Z7982 Long term (current) use of aspirin: Secondary | ICD-10-CM | POA: Insufficient documentation

## 2020-10-06 NOTE — Progress Notes (Signed)
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   HISTORY OF PRESENT ILLNESS: [name] [name] is a 65 y.o. male seen at the request of Dr. Renaee Munda for newly diagnosed biliary carcinoma.  The patient presented to the emergency room after experiencing more than a week of abdominal pain, nausea and diarrhea.  He had had some jaundice the day prior to his hospital admission as well as weakness and headaches.  Apparently he and his family had developed a GI virus or bug prior to this but that was persistent for him and drove this evaluation.  In the emergency department he was found to have hyperbilirubinemia and an ultrasound of the abdomen and right upper quadrant revealed an intrahepatic biliary ductal dilatation and proximal common bile duct dilatation CT abdomen pelvis on 09/22/2020 the same day showed questionable bibasilar pulmonary fibrosis and homogeneous hyperdensity filling the gallbladder lumen with intra and extrahepatic biliary ductal dilatation no dilation of the pancreatic duct was identified.  Nonspecific perirectal fat stranding with under distention of the rectum and atherosclerotic disease of the aorta were also seen.  An MRCP of the abdomen on 09/23/2020 showed obstructing soft tissue mass in the proximal common bile duct at or adjacent to the junction with the cystic duct resulting in moderate to severe intrahepatic biliary ductal dilatation and biliary sludge filling the gallbladder lumen.  The soft tissue density was estimated at 1.2 x 1.8 cm in the distal common bile duct was completely decompressed.  No filling defects were  noted in the distal common bile duct he underwent ERCP with Dr. Benson Norway and images revealed irregular biliary stricture involving the proximal common bile duct region at the time of the procedure Dr. Benson Norway described deeply cannulating the bile duct with brisk flow of contrast through the ducts but contrast extended into the hepatic ducts and the common bile duct contained a single local stenosis that was 10 mm in length the common biliary tree and right and left hepatic ducts were also moderately dilated and diffusely dilated with a mass causing an obstruction a guidewire was passed into the biliary tree and sphincterotomy was made cytology was submitted in a 7 [place] by 7 cm plastic biliary stent was left in place with good biliary flow through the stent at the conclusion of the previous treatment.  Final cytology from the specimen was consistent with an adenocarcinoma.  He is scheduled to undergo a CT chest with contrast and CT abdomen pelvis with and without contrast on 10/10/2020 ordered by Dr. Zenia Resides.  She saw him yesterday and recommended the triple phase CT scan to better evaluate the vasculature and hopefully would be a good upfront surgical candidate.  He is seen today to discuss the possibilities of radiotherapy as a part of his treatment plan.    PREVIOUS RADIATION THERAPY: No   PAST MEDICAL HISTORY:  Past Medical History:  Diagnosis Date   Alcoholism ([place])    Anxiety    COPD (chronic obstructive pulmonary disease) ([place])    Depression    " post traumatic stress disorder" sees MD in [place] every 3-6  months.   Diabetes mellitus without complication (H[facility])    Dysrhythmia    hx. Bundle branch block- saw Dr. Kathlee Nations   Irregular heart rate    Sleep apnea    cpap used with nose clip- Dr. Baird Lyons follows   Substance abuse [facility])    "tends to self medicate(Rx. meds or Alcohol) trying to get relief from "PSTD"       PAST SURGICAL HISTORY: Past Surgical History:  Procedure  Laterality Date   BILIARY BRUSHING  09/23/2020   Procedure: BILIARY BRUSHING;  Surgeon: Carol Ada, MD;  Location: [facility];  Service: Endoscopy;;   BILIARY STENT PLACEMENT N/A 09/23/2020   Procedure: BILIARY STENT PLACEMENT;  Surgeon: Carol Ada, MD;  Location: [facility];  Service: Endoscopy;  Laterality: N/A;   COLONOSCOPY WITH PROPOFOL N/A 01/19/2014   Procedure: COLONOSCOPY WITH PROPOFOL;  Surgeon: Juanita Craver, MD;  Location: [facility];  Service: Endoscopy;  Laterality: N/A;   ERCP N/A 09/23/2020   Procedure: ENDOSCOPIC RETROGRADE CHOLANGIOPANCREATOGRAPHY (ERCP);  Surgeon: Carol Ada, MD;  Location: [facility];  Service: Endoscopy;  Laterality: N/A;   SPHINCTEROTOMY  09/23/2020   Procedure: SPHINCTEROTOMY;  Surgeon: Carol Ada, MD;  Location: [facility];  Service: Endoscopy;;     FAMILY HISTORY:  Family History  Problem Relation Age of Onset   Diabetes Father    Heart Problems Father    Heart disease Father    Mental illness Father      SOCIAL HISTORY:  reports that he has been smoking cigarettes. He started smoking about 3 years ago. He has a 45.00 pack-year smoking history. He has never used smokeless tobacco. He reports that he does not currently use alcohol after a past usage of about 42.0 standard drinks per week. He reports that he does not use drugs.  The patient is married and accompanied by his wife, they live in [place].     ALLERGIES: Patient has no known allergies.   MEDICATIONS:  Current Outpatient Medications  Medication Sig Dispense Refill   albuterol (VENTOLIN HFA) 108 (90 Base) MCG/ACT inhaler Inhale 2 puffs into the lungs every 6 (six) hours as needed for wheezing or shortness of breath. 8 g 12   Ascorbic Acid (VITAMIN C) 1000 MG tablet Take 1,000 mg by mouth daily.     aspirin EC 81 MG tablet Take 81 mg by mouth daily. Swallow whole.     busPIRone (BUSPAR) 5 MG tablet Take 5 mg by mouth at bedtime.     Cholecalciferol (VITAMIN D) 2000  UNITS tablet Take 2,000 Units by mouth daily.     CHROMIUM PO Take 1 tablet by mouth daily.     clonazePAM (KLONOPIN) 0.5 MG tablet TAKE 1 TABLET BY MOUTH TWICE DAILY AS NEEDED 60 tablet 5   doxylamine, Sleep, (UNISOM) 25 MG tablet Take 25 mg by mouth at bedtime as needed for sleep.     folic acid (FOLVITE) 1 MG tablet Take 1 mg by mouth daily.     magnesium oxide (MAG-OX) 400 (240 Mg) MG tablet Take 400 mg by mouth daily.     metFORMIN (GLUCOPHAGE) 1000 MG tablet TAKE 1 TABLET(1000 MG) BY MOUTH TWICE DAILY WITH A MEAL 180 tablet 3   Multiple Vitamin (MULTIVITAMIN WITH MINERALS) TABS tablet Take 1 tablet by mouth daily.     Naltrexone (VIVITROL) 380 MG SUSR Inject 380 mg into the muscle every 30 (thirty) days.     PARoxetine (PAXIL) 40 MG tablet Take 50 mg by mouth every morning.  QUEtiapine (SEROQUEL) 50 MG tablet Take 50 mg by mouth at bedtime.     tiaGABine (GABITRIL) 4 MG tablet Take 8 mg by mouth at bedtime.     VASCEPA 1 g capsule TAKE 2 CAPSULES(2 GRAMS) BY MOUTH TWICE DAILY 360 capsule 1   vitamin B-12 (CYANOCOBALAMIN) 500 MCG tablet Take 500 mcg by mouth daily before supper.      No current facility-administered medications for this encounter.     REVIEW OF SYSTEMS: On review of systems, the patient reports that he is doing very well at this time since the placement of his biliary stent.  He has not had any jaundice or itching but is aware that his levels have still been elevated.  He is not experiencing any nausea or vomiting.  He does have occasional discomfort and pressure in the mid abdomen but does not go to the bathroom regularly to have a bowel movement and states it can take 5 to 6 days to be able to pass stool.  He does feel gassy and bloated as well.  He has had some urinary hesitancy at times and reports cloudiness of his urine from time to time.  He has lost approximately 85 pounds intentionally and has been more active physically.  No other complaints are verbalized.      PHYSICAL EXAM:  Wt Readings from Last 3 Encounters:  10/06/20 187 lb 8 oz (85 kg)  09/22/20 178 lb 5.6 oz (80.9 kg)  09/22/20 179 lb (81.2 kg)   Temp Readings from Last 3 Encounters:  10/06/20 (!) 97.2 F (36.2 C) (Temporal)  09/24/20 98.5 F (36.9 C) (Oral)  07/28/20 99 F (37.2 C) (Oral)   BP Readings from Last 3 Encounters:  10/06/20 137/79  09/24/20 131/80  07/28/20 118/60   Pulse Readings from Last 3 Encounters:  10/06/20 82  09/24/20 (!) 59  07/28/20 72   Pain Assessment Pain Score: 2 /10  In general this is a well appearing Caucasian male in no acute distress. He's alert and oriented x4 and appropriate throughout the examination. Cardiopulmonary assessment is negative for acute distress and he exhibits normal effort.    ECOG = 1  0 - Asymptomatic (Fully active, able to carry on all predisease activities without restriction)  1 - Symptomatic but completely ambulatory (Restricted in physically strenuous activity but ambulatory and able to carry out work of a light or sedentary nature. For example, light housework, office work)  2 - Symptomatic, <50% in bed during the day (Ambulatory and capable of all self care but unable to carry out any work activities. Up and about more than 50% of waking hours)  3 - Symptomatic, >50% in bed, but not bedbound (Capable of only limited self-care, confined to bed or chair 50% or more of waking hours)  4 - Bedbound (Completely disabled. Cannot carry on any self-care. Totally confined to bed or chair)  5 - Death   [name] [name] MM, Creech RH, Tormey DC, et al. [phone]). "Toxicity and response criteria of the [facility]". [place] Oncol. 5 (6): 649-55    LABORATORY DATA:  Lab Results  Component Value Date   WBC 8.4 09/24/2020   HGB 13.5 09/24/2020   HCT 39.1 09/24/2020   MCV 96.3 09/24/2020   PLT 208 09/24/2020   Lab Results  Component Value Date   NA 136 09/24/2020   K 3.6 09/24/2020   CL 99  09/24/2020   CO2 27 09/24/2020   Lab Results  Component  Value Date   ALT 105 (H) 09/24/2020   AST 54 (H) 09/24/2020   ALKPHOS 416 (H) 09/24/2020   BILITOT 6.3 (H) 09/24/2020      RADIOGRAPHY: CT ABDOMEN PELVIS W CONTRAST  Result Date: 09/22/2020 CLINICAL DATA:  Nonlocalized acute abdominal pain. Biliary obstruction or mass. EXAM: CT ABDOMEN AND PELVIS WITH CONTRAST TECHNIQUE: Multidetector CT imaging of the abdomen and pelvis was performed using the standard protocol following bolus administration of intravenous contrast. CONTRAST:  164m OMNIPAQUE IOHEXOL 300 MG/ML  SOLN COMPARISON:  Ultrasound abdomen 09/22/2020. FINDINGS: Lower chest: Bibasilar reticulations. Coronary artery calcifications. Hepatobiliary: No focal liver abnormality. Homogeneous hyperdensity fills the gallbladder lumen. Intra and extrahepatic biliary ductal dilatation. Pancreas: No focal lesion. Normal pancreatic contour. No surrounding inflammatory changes. No main pancreatic ductal dilatation. Spleen: Normal in size without focal abnormality. Adrenals/Urinary Tract: No adrenal nodule bilaterally. Bilateral kidneys enhance symmetrically. No hydronephrosis. No hydroureter. The urinary bladder is unremarkable. Stomach/Bowel: Stomach is within normal limits. No evidence of bowel wall thickening or dilatation. Under distension of the rectum. Perirectal fat stranding. The appendix not definitely identified. Vascular/Lymphatic: No abdominal aorta or iliac aneurysm. Severe atherosclerotic plaque of the aorta and its branches. No abdominal, pelvic, or inguinal lymphadenopathy. Reproductive: Prostate is unremarkable. Other: No intraperitoneal free fluid. No intraperitoneal free gas. No organized fluid collection. Musculoskeletal: No abdominal wall hernia or abnormality. No suspicious lytic or blastic osseous lesions. No acute displaced fracture. Multilevel degenerative changes of the spine. IMPRESSION: 1. Question bibasilar pulmonary  fibrosis. 2. Homogeneous hyperdensity fills the gallbladder lumen - likely represents gallbladder sludge. Intra and extrahepatic biliary ductal dilatation with no main pancreatic duct dilatation. Findings are consistent with biliary obstruction, possibly due to a common bile duct mass which is incompletely evaluated on this single phase portal venous study. Recommend MRCP for further evaluation. 3. Nonspecific perirectal fat stranding with under distension of the rectum. 4. Aortic Atherosclerosis (ICD10-I70.0). Electronically Signed   By: MIven FinnM.D.   On: 09/22/2020 20:20   MR 3D Recon At Scanner  Result Date: 09/23/2020 CLINICAL DATA:  65year old male with history of jaundice. EXAM: MRI ABDOMEN WITHOUT AND WITH CONTRAST (INCLUDING MRCP) TECHNIQUE: Multiplanar multisequence MR imaging of the abdomen was performed both before and after the administration of intravenous contrast. Heavily T2-weighted images of the biliary and pancreatic ducts were obtained, and three-dimensional MRCP images were rendered by post processing. CONTRAST:  843mGADAVIST GADOBUTROL 1 MMOL/ML IV SOLN COMPARISON:  CT the abdomen and pelvis 09/22/2020. FINDINGS: Comment: Portions of today's examination are severely limited by artifact from a large amount of patient respiratory motion. Lower chest: Unremarkable. Hepatobiliary: No suspicious cystic or solid hepatic lesions. However, MRCP images demonstrate moderate to severe intra hepatic biliary ductal dilatation. The ductal dilatation appears to abruptly terminate in the proximal common bile duct at or near the junction with the cystic duct (which is very poorly visualized on today's motion limited examination). The source of obstruction is poorly demonstrated, but appears to be a band like area of soft tissue encasing the duct, best appreciated on axial image 20 of series 5 where this is estimated to measure approximately 1.2 x 1.8 cm, and corresponds to areas of enhancement on  post gadolinium imaging. Distal common bile duct is completely decompressed. No filling defects are noted in the distal common bile duct to suggest choledocholithiasis. Gallbladder is moderately distended with T1 hyperintense mildly T2 hyperintense material, which likely represents biliary sludge. Pancreas: No pancreatic mass. No pancreatic ductal dilatation noted on MRCP  images. No pancreatic or peripancreatic fluid collections or inflammatory changes. Spleen:  Unremarkable. Adrenals/Urinary Tract: Subcentimeter T1 hypointense, T2 hyperintense, nonenhancing lesions in both kidneys are compatible with simple cysts. No hydroureteronephrosis in the visualized portions of the abdomen. Bilateral adrenal glands are normal in appearance. Stomach/Bowel: Visualized portions are unremarkable. Vascular/Lymphatic: No aneurysm identified in the visualized abdominal vasculature. No lymphadenopathy. Other: No significant volume of ascites noted in the visualized portions of the peritoneal cavity. Musculoskeletal: No aggressive appearing osseous lesions are noted in the visualized portions of the skeleton. IMPRESSION: 1. Obstructing soft tissue mass in the proximal common bile duct at or adjacent to the junction with the cystic duct, associated with moderate to severe intrahepatic biliary ductal dilatation indicating obstruction, as discussed above. 2. Biliary sludge filling the gallbladder lumen. Electronically Signed   By: Vinnie Langton M.D.   On: 09/23/2020 06:27   DG ERCP BILIARY & PANCREATIC DUCTS  Result Date: 09/24/2020 CLINICAL DATA:  Biliary obstruction. EXAM: ERCP TECHNIQUE: Multiple spot images obtained with the fluoroscopic device and submitted for interpretation post-procedure. FLUOROSCOPY TIME:  Fluoroscopy Time:  2 minutes, 5 seconds COMPARISON:  MRI 09/23/2020 FINDINGS: Retrograde cholangiogram demonstrates irregularity and filling defects in the proximal common bile duct region/common hepatic duct region.  Wire was advanced into the intrahepatic biliary system. A nonmetallic biliary stent was placed across the area of narrowing and irregularity. IMPRESSION: Irregular biliary stricture involving the proximal common bile duct region. This is concerning for a malignant stricture based on the cholangiogram appearance and previous cross-sectional imaging. These images were submitted for radiologic interpretation only. Please see the procedural report for the amount of contrast and the fluoroscopy time utilized. Electronically Signed   By: Markus Daft M.D.   On: 09/24/2020 08:43   MR ABDOMEN MRCP W WO CONTAST  Result Date: 09/23/2020 CLINICAL DATA:  65 year old male with history of jaundice. EXAM: MRI ABDOMEN WITHOUT AND WITH CONTRAST (INCLUDING MRCP) TECHNIQUE: Multiplanar multisequence MR imaging of the abdomen was performed both before and after the administration of intravenous contrast. Heavily T2-weighted images of the biliary and pancreatic ducts were obtained, and three-dimensional MRCP images were rendered by post processing. CONTRAST:  16m GADAVIST GADOBUTROL 1 MMOL/ML IV SOLN COMPARISON:  CT the abdomen and pelvis 09/22/2020. FINDINGS: Comment: Portions of today's examination are severely limited by artifact from a large amount of patient respiratory motion. Lower chest: Unremarkable. Hepatobiliary: No suspicious cystic or solid hepatic lesions. However, MRCP images demonstrate moderate to severe intra hepatic biliary ductal dilatation. The ductal dilatation appears to abruptly terminate in the proximal common bile duct at or near the junction with the cystic duct (which is very poorly visualized on today's motion limited examination). The source of obstruction is poorly demonstrated, but appears to be a band like area of soft tissue encasing the duct, best appreciated on axial image 20 of series 5 where this is estimated to measure approximately 1.2 x 1.8 cm, and corresponds to areas of enhancement on post  gadolinium imaging. Distal common bile duct is completely decompressed. No filling defects are noted in the distal common bile duct to suggest choledocholithiasis. Gallbladder is moderately distended with T1 hyperintense mildly T2 hyperintense material, which likely represents biliary sludge. Pancreas: No pancreatic mass. No pancreatic ductal dilatation noted on MRCP images. No pancreatic or peripancreatic fluid collections or inflammatory changes. Spleen:  Unremarkable. Adrenals/Urinary Tract: Subcentimeter T1 hypointense, T2 hyperintense, nonenhancing lesions in both kidneys are compatible with simple cysts. No hydroureteronephrosis in the visualized portions of the abdomen. Bilateral  adrenal glands are normal in appearance. Stomach/Bowel: Visualized portions are unremarkable. Vascular/Lymphatic: No aneurysm identified in the visualized abdominal vasculature. No lymphadenopathy. Other: No significant volume of ascites noted in the visualized portions of the peritoneal cavity. Musculoskeletal: No aggressive appearing osseous lesions are noted in the visualized portions of the skeleton. IMPRESSION: 1. Obstructing soft tissue mass in the proximal common bile duct at or adjacent to the junction with the cystic duct, associated with moderate to severe intrahepatic biliary ductal dilatation indicating obstruction, as discussed above. 2. Biliary sludge filling the gallbladder lumen. Electronically Signed   By: Vinnie Langton M.D.   On: 09/23/2020 06:27   US Abdomen Limited RUQ (LIVER/GB)  Result Date: 09/22/2020 CLINICAL DATA:  Hyperbilirubinemia EXAM: ULTRASOUND ABDOMEN LIMITED RIGHT UPPER QUADRANT COMPARISON:  Ultrasound 04/11/2017 FINDINGS: Gallbladder: The gallbladder is mildly distended, filled with material of varying echogenicity, likely sludge and tiny stones. No pericholecystic fluid or wall thickening. Sonographic Murphy's was not determined by the sonographer. Common bile duct: Diameter: Measures up to 11  mm proximally. Liver: There is intrahepatic biliary ductal dilation. Coarsened liver parenchyma in somewhat nodular left hepatic lobe. Portal vein is patent on color Doppler imaging with normal direction of blood flow towards the liver. Other: None. IMPRESSION: Intrahepatic biliary ductal dilation and dilated proximal common bile duct. Findings are consistent with biliary obstruction, possibly due to a common bile duct mass, incompletely evaluated by ultrasound. Recommend multiphase CT or MRCP for further evaluation. Mildly distended gallbladder filled with material of varying echogenicity, likely sludge and tiny stones. In the absence of leukocytosis and right upper quadrant pain this is unlikely to represent cholecystitis. Coarsened liver echogenicity with somewhat nodular left hepatic lobe, compatible with chronic liver disease. Electronically Signed   By: Maurine Simmering   On: 09/22/2020 19:26       IMPRESSION/PLAN: 1. Cholangiocarcinoma of the common bile duct. Dr. Lisbeth Renshaw discusses the pathology findings and reviews the nature of cholangiocarcinoma. We agree with continuing his diagnostic work up of his cancer including the abdominal scans that Dr. Zenia Resides needs as well as a staging PET scan. His case will be discussed in multidisciplinary GI oncology conference next week. We also briefly discussed the utility of radiotherapy in the adjuvant or palliative setting, but at this time the hopes are to proceed surgically if he is a good candidate. He will follow up with his CT imaging with Dr. Zenia Resides next week too. We discussed the risks, benefits, short, and long term effects of radiotherapy, as well as the different courses and  intents of treatment and we will follow along in his work up prior to making final decisions of his therapy.  In a visit lasting 60 minutes, greater than 50% of the time was spent face to face discussing the patient's condition, in preparation for the discussion, and coordinating the  patient's care.   The above documentation reflects my direct findings during this shared patient visit. Please see the separate note by Dr. Lisbeth Renshaw on this date for the remainder of the patient's plan of care.    Carola Rhine, [facility]   **Disclaimer: This note was dictated with voice recognition software. Similar sounding words can inadvertently be transcribed and this note may contain transcription errors which may not have been corrected upon publication of note.**

## 2020-10-06 NOTE — Telephone Encounter (Signed)
Called patient to inform of Pet Scan for 10-10-20- arrival time- 7 am , patient to have water only- 6 hrs. prior to test, I also moved patient's appt. with Dr. Annamaria Boots to 10-20-20- arrival time- 11:15 am, spoke with patient's wife- [name] and she is aware of these appts. and is good with them

## 2020-10-09 DIAGNOSIS — C221 Intrahepatic bile duct carcinoma: Secondary | ICD-10-CM | POA: Insufficient documentation

## 2020-10-10 ENCOUNTER — Ambulatory Visit: Payer: Federal, State, Local not specified - PPO | Admitting: Internal Medicine

## 2020-10-10 ENCOUNTER — Ambulatory Visit (HOSPITAL_COMMUNITY)
Admission: RE | Admit: 2020-10-10 | Discharge: 2020-10-10 | Disposition: A | Discharge status: Home / Self Care | Payer: Federal, State, Local not specified - PPO | Source: Ambulatory Visit | Attending: Radiation Oncology | Admitting: Radiation Oncology

## 2020-10-10 ENCOUNTER — Encounter (HOSPITAL_COMMUNITY): Payer: Self-pay

## 2020-10-10 ENCOUNTER — Other Ambulatory Visit: Payer: Self-pay

## 2020-10-10 ENCOUNTER — Ambulatory Visit (HOSPITAL_COMMUNITY)
Admission: RE | Admit: 2020-10-10 | Discharge: 2020-10-10 | Disposition: A | Discharge status: Home / Self Care | Payer: Federal, State, Local not specified - PPO | Source: Ambulatory Visit | Attending: Surgery | Admitting: Surgery

## 2020-10-10 ENCOUNTER — Encounter: Payer: Self-pay | Admitting: Hematology

## 2020-10-10 ENCOUNTER — Telehealth: Payer: Self-pay | Admitting: Radiation Oncology

## 2020-10-10 ENCOUNTER — Inpatient Hospital Stay: Payer: Federal, State, Local not specified - PPO | Attending: Hematology | Admitting: Hematology

## 2020-10-10 VITALS — BP 137/85 | HR 75 | Temp 99.6°F | Resp 18 | Ht 70.0 in | Wt 183.0 lb

## 2020-10-10 DIAGNOSIS — E119 Type 2 diabetes mellitus without complications: Secondary | ICD-10-CM

## 2020-10-10 DIAGNOSIS — F1021 Alcohol dependence, in remission: Secondary | ICD-10-CM | POA: Insufficient documentation

## 2020-10-10 DIAGNOSIS — C249 Malignant neoplasm of biliary tract, unspecified: Secondary | ICD-10-CM | POA: Insufficient documentation

## 2020-10-10 DIAGNOSIS — C240 Malignant neoplasm of extrahepatic bile duct: Secondary | ICD-10-CM | POA: Diagnosis present

## 2020-10-10 DIAGNOSIS — F17210 Nicotine dependence, cigarettes, uncomplicated: Secondary | ICD-10-CM | POA: Insufficient documentation

## 2020-10-10 DIAGNOSIS — C221 Intrahepatic bile duct carcinoma: Secondary | ICD-10-CM | POA: Diagnosis present

## 2020-10-10 LAB — GLUCOSE, CAPILLARY: Glucose-Capillary: 100 mg/dL — ABNORMAL HIGH (ref 70–99)

## 2020-10-10 MED ORDER — IOHEXOL 9 MG/ML PO SOLN
1000.0000 mL | ORAL | Status: AC
  Administered 2020-10-10: 1000 mL via ORAL

## 2020-10-10 MED ORDER — IOHEXOL 9 MG/ML PO SOLN
ORAL | Status: AC
  Filled 2020-10-10: qty 1000

## 2020-10-10 MED ORDER — IOHEXOL 350 MG/ML SOLN
100.0000 mL | Freq: Once | INTRAVENOUS | Status: AC | PRN
  Administered 2020-10-10: 80 mL via INTRAVENOUS

## 2020-10-10 MED ORDER — FLUDEOXYGLUCOSE F - 18 (FDG) INJECTION
9.7000 | Freq: Once | INTRAVENOUS | Status: AC
  Administered 2020-10-10: 9.35 via INTRAVENOUS

## 2020-10-10 NOTE — Telephone Encounter (Signed)
I called and spoke with the patient's wife to review his PET. He will get a CT as well today and see Dr. Burr Medico. His case will be discussed in conference this week too and hopefully leaning toward surgery depending on today's CT.

## 2020-10-10 NOTE — Progress Notes (Signed)
I met with [name] [name] and his wife after his consultation with Dr Burr Medico.  I explained my role as the GI Oncology Nurse Navigator.  I provided my contact information.  All questions were answered. He declined nutrition referral at this time. They verbalized understanding.

## 2020-10-10 NOTE — Progress Notes (Signed)
[place]   Telephone:([phone]) [phone] Fax:([phone]) [place] Note   Patient Care Team: Hoyt Koch, MD as PCP - General (Internal Medicine) Deneise Lever, MD as Consulting Physician (Pulmonary Disease) Dwan Bolt, MD as Consulting Physician (General Surgery) Kyung Rudd, MD as Consulting Physician (Radiation Oncology) [name], MD as Consulting Physician (Hematology)  Date of Service:  10/10/2020   CHIEF COMPLAINTS/PURPOSE OF CONSULTATION:  Newly diagnosed extrahepatic cholangiocarcinoma  REFERRING PHYSICIAN:  Dr. Carol Ada  Oncology History  Cholangiocarcinoma [facility])  09/22/2020 Imaging   US Abdomen  IMPRESSION: Intrahepatic biliary ductal dilation and dilated proximal common bile duct. Findings are consistent with biliary obstruction, possibly due to a common bile duct mass, incompletely evaluated by ultrasound. Recommend multiphase CT or MRCP for further evaluation.   Mildly distended gallbladder filled with material of varying echogenicity, likely sludge and tiny stones. In the absence of leukocytosis and right upper quadrant pain this is unlikely to represent cholecystitis.   Coarsened liver echogenicity with somewhat nodular left hepatic lobe, compatible with chronic liver disease.   09/22/2020 Imaging   CT AP  IMPRESSION: 1. Question bibasilar pulmonary fibrosis. 2. Homogeneous hyperdensity fills the gallbladder lumen - likely represents gallbladder sludge. Intra and extrahepatic biliary ductal dilatation with no main pancreatic duct dilatation. Findings are consistent with biliary obstruction, possibly due to a common bile duct mass which is incompletely evaluated on this single phase portal venous study. Recommend MRCP for further evaluation. 3. Nonspecific perirectal fat stranding with under distension of the rectum. 4. Aortic Atherosclerosis (ICD10-I70.0).   09/23/2020 Imaging   MRI Abdomen  MRCP  IMPRESSION: 1. Obstructing soft tissue mass in the proximal common bile duct at or adjacent to the junction with the cystic duct, associated with moderate to severe intrahepatic biliary ductal dilatation indicating obstruction, as discussed above. 2. Biliary sludge filling the gallbladder lumen.   09/23/2020 Procedure   ERCP, Dr. Benson Norway  Impression: - The major papilla appeared normal. - A single localized biliary stricture was found in the common bile duct. The stricture was malignant appearing. - The left and right hepatic ducts and all intrahepatic branches and common bile duct were moderately dilated, with a mass causing an obstruction. - A biliary sphincterotomy was performed. - Cells for cytology obtained in the upper third of the main bile duct. - One plastic biliary stent was placed into the common bile duct.   09/23/2020 Pathology Results   A. BILIARY, BRUSHING:   FINAL MICROSCOPIC DIAGNOSIS:  - Malignant cells consistent with adenocarcinoma    10/09/2020 Initial Diagnosis   Cholangiocarcinoma ([place])   10/10/2020 PET scan   IMPRESSION: 1. No evidence of metastatic disease. Common bile duct mass, better seen and described on MR abdomen 09/23/2020. 2. Common bile duct stent in place with persistent biliary ductal dilatation and associated pneumobilia. 3. Tiny left renal stone. 4. Bladder wall thickening. 5. Aortic atherosclerosis (ICD10-I70.0). Coronary artery calcification. 6.  Emphysema (ICD10-J43.9).   10/10/2020 Imaging   CT AP  IMPRESSION: No suspicious liver lesions. Replaced right hepatic artery arising from the SMA.   Common bile duct tumor appears unchanged compared to prior CT. Common bile duct stent place.   Findings of fibrotic interstitial lung disease in the partially visualized lower chest, could be further evaluated with dedicated high-resolution chest CT.      HISTORY OF PRESENTING ILLNESS:  [name] [name] 65 y.o. male is a here because of  cholangiocarcinoma. The patient was referred by Dr.  Hung. The patient presents to the clinic today accompanied by his wife [name].  He initially reached out to his PCP on 09/22/20 for ongoing stomach virus symptoms. He notes his family all had a "stomach bug," and their symptoms improved. He continued to experience diarrhea, vomiting, abdominal tenderness, and dark urine, as well as jaundice, blurred vision, dizziness, and malaise. He was referred to the ED that day and was found to have bilirubin of 15 and large bilirubin on urinalysis. Abdomen US showed findings consistent with biliary obstruction.   He was admitted and underwent abdomen MRI the following day showing: obstructing soft tissue mass in proximal common bile duct at or adjacent to junction with cystic duct; biliary sludge filling gallbladder lumen. He proceeded to emergent ERCP the same day under Dr. Benson Norway. Cytology from the procedure confirmed malignant cells consistent with adenocarcinoma.  He has met with Dr. Zenia Resides and with Dr. Lisbeth Renshaw. He hopes to proceed with Whipple procedure under Dr. Zenia Resides. PET scan from this morning (10/10/20) showed no evidence of metastatic disease.   Today the patient notes they felt/feeling prior/after... -He reports he had donated plasma, and the plasma was noted to be dark in color. He later went to the restroom and noticed his skin and eyes were yellow. He states that's when he reached out to his PCP. -He notes he has been trying to lose weight since 11/2019, and he lost about 80-85 lbs. He reports he increased his protein intake and began walking more. His PCP lowered his metformin dosage. -Recent colonoscopy from 04/2020 was normal. -He denies any pain or feeling sick prior to his ED visit. -He reports some issues with balance and has fallen once at home. -He reports swelling to his ankles and feet, which was present prior to his hospital visit. -He reports feeling well and has lots of energy until about 3  pm, when he becomes fatigues. He denies feeling tired, "I've tried to lay down and go to sleep."   He has a PMHx of.... -mild COPD, has rescue inhaler that does not need -current smoker, has tried vaping -prior alcohol use, has been sober since 11/2019 -anxiety and depression, follows with [facility] behavioral health (Dr. Dwyane Dee).   Socially... -married with two children -no family history of cancer to his knowledge -retired from working as a Secretary/administrator.   REVIEW OF SYSTEMS:    Constitutional: Denies fevers, chills or abnormal night sweats Eyes: Denies blurriness of vision, double vision or watery eyes Ears, nose, mouth, throat, and face: Denies mucositis or sore throat Respiratory: Denies cough, dyspnea or wheezes Cardiovascular: Denies palpitation, chest discomfort or lower extremity swelling Gastrointestinal:  Denies nausea, heartburn or change in bowel habits Skin: Denies abnormal skin rashes Lymphatics: Denies new lymphadenopathy or easy bruising Neurological:Denies numbness, tingling or new weaknesses Behavioral/Psych: Mood is stable, no new changes  All other systems were reviewed with the patient and are negative.   MEDICAL HISTORY:  Past Medical History:  Diagnosis Date   Alcoholism ([place])    Anxiety    COPD (chronic obstructive pulmonary disease) ([place])    Depression    " post traumatic stress disorder" sees MD in [place] every 3-6 months.   Diabetes mellitus without complication (HCC)    Dysrhythmia    hx. Bundle branch block- saw Dr. Kathlee Nations   Irregular heart rate    Sleep apnea    cpap used with nose clip- Dr. Baird Lyons follows   Substance abuse [facility])    "tends  to self medicate(Rx. meds or Alcohol) trying to get relief from "PSTD"    SURGICAL HISTORY: Past Surgical History:  Procedure Laterality Date   BILIARY BRUSHING  09/23/2020   Procedure: BILIARY BRUSHING;  Surgeon: Carol Ada, MD;  Location: [facility] ENDOSCOPY;  Service:  Endoscopy;;   BILIARY STENT PLACEMENT N/A 09/23/2020   Procedure: BILIARY STENT PLACEMENT;  Surgeon: Carol Ada, MD;  Location: [facility] ENDOS[facility];  Service: Endoscopy;  Laterality: N/A;   COLONOSCOPY WITH PROPOFOL N/A 01/19/2014   Procedure: COLONOSCOPY WITH PROPOFOL;  Surgeon: Juanita Craver, MD;  Location: [facility] ENDOSCOPY;  Service: Endoscopy;  Laterality: N/A;   ERCP N/A 09/23/2020   Procedure: ENDOSCOPIC RETROGRADE CHOLANGIOPANCREATOGRAPHY (ERCP);  Surgeon: Carol Ada, MD;  Location: [facility];  Service: Endoscopy;  Laterality: N/A;   SPHINCTEROTOMY  09/23/2020   Procedure: SPHINCTEROTOMY;  Surgeon: Carol Ada, MD;  Location: [facility];  Service: Endoscopy;;    SOCIAL HISTORY: Social History   Socioeconomic History   Marital status: Married    Spouse name: Not on file   Number of children: 2   Years of education: Not on file   Highest education level: Not on file  Occupational History   Not on file  Tobacco Use   Smoking status: Every Day    Packs/day: 1.50    Years: 30.00    Pack years: 45.00    Types: Cigarettes    Start date: 08/24/2017   Smokeless tobacco: Never  Vaping Use   Vaping Use: Every day  Substance and Sexual Activity   Alcohol use: Not Currently    Alcohol/week: 42.0 standard drinks    Types: 42 Cans of beer per week    Comment: Quit in October 2021, used to drink heavy   Drug use: No   Sexual activity: Yes  Other Topics Concern   Not on file  Social History Narrative   Not on file   Social Determinants of Health   Financial Resource Strain: Not on file  Food Insecurity: Not on file  Transportation Needs: Not on file  Physical Activity: Not on file  Stress: Not on file  Social Connections: Not on file  Intimate Partner Violence: Not on file    FAMILY HISTORY: Family History  Problem Relation Age of Onset   Diabetes Father    Heart Problems Father    Heart disease Father    Mental illness Father     ALLERGIES:  has No Known  Allergies.  MEDICATIONS:  Current Outpatient Medications  Medication Sig Dispense Refill   albuterol (VENTOLIN HFA) 108 (90 Base) MCG/ACT inhaler Inhale 2 puffs into the lungs every 6 (six) hours as needed for wheezing or shortness of breath. 8 g 12   Ascorbic Acid (VITAMIN C) 1000 MG tablet Take 1,000 mg by mouth daily.     aspirin EC 81 MG tablet Take 81 mg by mouth daily. Swallow whole.     busPIRone (BUSPAR) 5 MG tablet Take 5 mg by mouth at bedtime.     Cholecalciferol (VITAMIN D) 2000 UNITS tablet Take 2,000 Units by mouth daily.     CHROMIUM PO Take 1 tablet by mouth daily.     clonazePAM (KLONOPIN) 0.5 MG tablet TAKE 1 TABLET BY MOUTH TWICE DAILY AS NEEDED 60 tablet 5   doxylamine, Sleep, (UNISOM) 25 MG tablet Take 25 mg by mouth at bedtime as needed for sleep.     folic acid (FOLVITE) 1 MG tablet Take 1 mg by mouth daily.     magnesium oxide (  MAG-OX) 400 (240 Mg) MG tablet Take 400 mg by mouth daily.     metFORMIN (GLUCOPHAGE) 1000 MG tablet TAKE 1 TABLET(1000 MG) BY MOUTH TWICE DAILY WITH A MEAL 180 tablet 3   Multiple Vitamin (MULTIVITAMIN WITH MINERALS) TABS tablet Take 1 tablet by mouth daily.     Naltrexone (VIVITROL) 380 MG SUSR Inject 380 mg into the muscle every 30 (thirty) days.     PARoxetine (PAXIL) 40 MG tablet Take 50 mg by mouth every morning.     QUEtiapine (SEROQUEL) 50 MG tablet Take 50 mg by mouth at bedtime.     tiaGABine (GABITRIL) 4 MG tablet Take 8 mg by mouth at bedtime.     VASCEPA 1 g capsule TAKE 2 CAPSULES(2 GRAMS) BY MOUTH TWICE DAILY 360 capsule 1   vitamin B-12 (CYANOCOBALAMIN) 500 MCG tablet Take 500 mcg by mouth daily before supper.      No current facility-administered medications for this visit.    PHYSICAL EXAMINATION: ECOG PERFORMANCE STATUS: 1 - Symptomatic but completely ambulatory  Vitals:   10/10/20 1527  BP: 137/85  Pulse: 75  Resp: 18  Temp: 99.6 F (37.6 C)  SpO2: 97%   Filed Weights   10/10/20 1527  Weight: 183 lb (83 kg)     GENERAL:alert, no distress and comfortable SKIN: skin color, texture, turgor are normal, no rashes or significant lesions EYES: normal, Conjunctiva are pink and non-injected, sclera mild jaundice  NECK: supple, thyroid normal size, non-tender, without nodularity LYMPH:  no palpable lymphadenopathy in the cervical, axillary  LUNGS: clear to auscultation and percussion with normal breathing effort HEART: regular rate & rhythm and no murmurs and no lower extremity edema ABDOMEN:abdomen soft, non-tender and normal bowel sounds Musculoskeletal:no cyanosis of digits and no clubbing  NEURO: alert & oriented x 3 with fluent speech, no focal motor/sensory deficits  LABORATORY DATA:  I have reviewed the data as listed CBC Latest Ref Rng & Units 09/24/2020 09/23/2020 09/22/2020  WBC 4.0 - 10.5 K/uL 8.4 8.4 9.4  Hemoglobin 13.0 - 17.0 g/dL 13.5 13.1 13.7  Hematocrit 39.0 - 52.0 % 39.1 37.4(L) 38.8(L)  Platelets 150 - 400 K/uL 208 191 219    CMP Latest Ref Rng & Units 09/24/2020 09/23/2020 09/22/2020  Glucose 70 - 99 mg/dL 84 87 109(H)  BUN 8 - 23 mg/dL _0 Creatinine 0.61 - 1.24 mg/dL 0.53(L) <0.30(L) 0.71  Sodium 135 - 145 mmol/L 136 135 135  Potassium 3.5 - 5.1 mmol/L 3.6 3.5 3.8  Chloride 98 - 111 mmol/L 99 103 98  CO2 22 - 32 mmol/L _1 Calcium 8.9 - 10.3 mg/dL 8.9 8.7(L) 8.6(L)  Total Protein 6.5 - 8.1 g/dL 5.9(L) 5.8(L) 6.3(L)  Total Bilirubin 0.3 - 1.2 mg/dL 6.3(H) 14.6(H) 15.3(H)  Alkaline Phos 38 - 126 U/L 416(H) 451(H) 475(H)  AST 15 - 41 U/L 54(H) 85(H) 110(H)  ALT 0 - 44 U/L 105(H) 128(H) 148(H)     RADIOGRAPHIC STUDIES: I have personally reviewed the radiological images as listed and agreed with the findings in the report. CT ABDOMEN PELVIS W WO CONTRAST  Result Date: 10/10/2020 CLINICAL DATA:  Common bile duct tumor EXAM: CT ABDOMEN AND PELVIS WITHOUT AND WITH CONTRAST TECHNIQUE: Multidetector CT imaging of the abdomen and pelvis was performed following the standard  protocol before and following the bolus administration of intravenous contrast. CONTRAST:  36m OMNIPAQUE IOHEXOL 350 MG/ML SOLN COMPARISON:  MRI abdomen dated September 24, 2018 FINDINGS: Lower chest: Findings of  fibrotic interstitial lung disease seen in the lower chest. Hepatobiliary: No suspicious liver lesions. Pneumobilia and periportal edema. Mildly dilated intrahepatic bile ducts. Focal enhancing soft tissue of the common bile duct seen on series 6, image 26, unchanged compared to prior CT. Common bile duct stent in place. Pancreas: Unremarkable. No pancreatic ductal dilatation or surrounding inflammatory changes. Spleen: Normal in size without focal abnormality. Adrenals/Urinary Tract: Adrenal glands are unremarkable. No evidence of hydronephrosis. Punctate nonobstructing stone of the lower pole the left kidney. Bladder is unremarkable. Stomach/Bowel: Stomach is within normal limits. Appendix is not visualized. No evidence of bowel wall thickening, distention, or inflammatory changes. Vascular/Lymphatic: Aortic atherosclerosis. No enlarged abdominal or pelvic lymph nodes. Replaced right hepatic artery arising from the SMA. Reproductive: Calcified and enlarged prostate. Other: No abdominal wall hernia or abnormality. No abdominopelvic ascites. Musculoskeletal: No acute or significant osseous findings. IMPRESSION: No suspicious liver lesions. Replaced right hepatic artery arising from the SMA. Common bile duct tumor appears unchanged compared to prior CT. Common bile duct stent place. Findings of fibrotic interstitial lung disease in the partially visualized lower chest, could be further evaluated with dedicated high-resolution chest CT. Electronically Signed   By: Yetta Glassman M.D.   On: 10/10/2020 11:43   CT ABDOMEN PELVIS W CONTRAST  Result Date: 09/22/2020 CLINICAL DATA:  Nonlocalized acute abdominal pain. Biliary obstruction or mass. EXAM: CT ABDOMEN AND PELVIS WITH CONTRAST TECHNIQUE: Multidetector  CT imaging of the abdomen and pelvis was performed using the standard protocol following bolus administration of intravenous contrast. CONTRAST:  161m OMNIPAQUE IOHEXOL 300 MG/ML  SOLN COMPARISON:  Ultrasound abdomen 09/22/2020. FINDINGS: Lower chest: Bibasilar reticulations. Coronary artery calcifications. Hepatobiliary: No focal liver abnormality. Homogeneous hyperdensity fills the gallbladder lumen. Intra and extrahepatic biliary ductal dilatation. Pancreas: No focal lesion. Normal pancreatic contour. No surrounding inflammatory changes. No main pancreatic ductal dilatation. Spleen: Normal in size without focal abnormality. Adrenals/Urinary Tract: No adrenal nodule bilaterally. Bilateral kidneys enhance symmetrically. No hydronephrosis. No hydroureter. The urinary bladder is unremarkable. Stomach/Bowel: Stomach is within normal limits. No evidence of bowel wall thickening or dilatation. Under distension of the rectum. Perirectal fat stranding. The appendix not definitely identified. Vascular/Lymphatic: No abdominal aorta or iliac aneurysm. Severe atherosclerotic plaque of the aorta and its branches. No abdominal, pelvic, or inguinal lymphadenopathy. Reproductive: Prostate is unremarkable. Other: No intraperitoneal free fluid. No intraperitoneal free gas. No organized fluid collection. Musculoskeletal: No abdominal wall hernia or abnormality. No suspicious lytic or blastic osseous lesions. No acute displaced fracture. Multilevel degenerative changes of the spine. IMPRESSION: 1. Question bibasilar pulmonary fibrosis. 2. Homogeneous hyperdensity fills the gallbladder lumen - likely represents gallbladder sludge. Intra and extrahepatic biliary ductal dilatation with no main pancreatic duct dilatation. Findings are consistent with biliary obstruction, possibly due to a common bile duct mass which is incompletely evaluated on this single phase portal venous study. Recommend MRCP for further evaluation. 3. Nonspecific  perirectal fat stranding with under distension of the rectum. 4. Aortic Atherosclerosis (ICD10-I70.0). Electronically Signed   By: MIven FinnM.D.   On: 09/22/2020 20:20   MR 3D Recon At Scanner  Result Date: 09/23/2020 CLINICAL DATA:  65year old male with history of jaundice. EXAM: MRI ABDOMEN WITHOUT AND WITH CONTRAST (INCLUDING MRCP) TECHNIQUE: Multiplanar multisequence MR imaging of the abdomen was performed both before and after the administration of intravenous contrast. Heavily T2-weighted images of the biliary and pancreatic ducts were obtained, and three-dimensional MRCP images were rendered by post processing. CONTRAST:  818mGADAVIST GADOBUTROL 1 MMOL/ML IV  SOLN COMPARISON:  CT the abdomen and pelvis 09/22/2020. FINDINGS: Comment: Portions of today's examination are severely limited by artifact from a large amount of patient respiratory motion. Lower chest: Unremarkable. Hepatobiliary: No suspicious cystic or solid hepatic lesions. However, MRCP images demonstrate moderate to severe intra hepatic biliary ductal dilatation. The ductal dilatation appears to abruptly terminate in the proximal common bile duct at or near the junction with the cystic duct (which is very poorly visualized on today's motion limited examination). The source of obstruction is poorly demonstrated, but appears to be a band like area of soft tissue encasing the duct, best appreciated on axial image 20 of series 5 where this is estimated to measure approximately 1.2 x 1.8 cm, and corresponds to areas of enhancement on post gadolinium imaging. Distal common bile duct is completely decompressed. No filling defects are noted in the distal common bile duct to suggest choledocholithiasis. Gallbladder is moderately distended with T1 hyperintense mildly T2 hyperintense material, which likely represents biliary sludge. Pancreas: No pancreatic mass. No pancreatic ductal dilatation noted on MRCP images. No pancreatic or peripancreatic  fluid collections or inflammatory changes. Spleen:  Unremarkable. Adrenals/Urinary Tract: Subcentimeter T1 hypointense, T2 hyperintense, nonenhancing lesions in both kidneys are compatible with simple cysts. No hydroureteronephrosis in the visualized portions of the abdomen. Bilateral adrenal glands are normal in appearance. Stomach/Bowel: Visualized portions are unremarkable. Vascular/Lymphatic: No aneurysm identified in the visualized abdominal vasculature. No lymphadenopathy. Other: No significant volume of ascites noted in the visualized portions of the peritoneal cavity. Musculoskeletal: No aggressive appearing osseous lesions are noted in the visualized portions of the skeleton. IMPRESSION: 1. Obstructing soft tissue mass in the proximal common bile duct at or adjacent to the junction with the cystic duct, associated with moderate to severe intrahepatic biliary ductal dilatation indicating obstruction, as discussed above. 2. Biliary sludge filling the gallbladder lumen. Electronically Signed   By: Vinnie Langton M.D.   On: 09/23/2020 06:27   NM PET Image Initial (PI) Skull Base To Thigh  Result Date: 10/10/2020 CLINICAL DATA:  I initial treatment strategy for hepatic biliary cancer. EXAM: NUCLEAR MEDICINE PET SKULL BASE TO THIGH TECHNIQUE: 9.4 mCi F-18 FDG was injected intravenously. Full-ring PET imaging was performed from the skull base to thigh after the radiotracer. CT data was obtained and used for attenuation correction and anatomic localization. Fasting blood glucose: 100 mg/dl COMPARISON:  MR abdomen 09/23/2020 and CT abdomen pelvis 09/22/2020. FINDINGS: Mediastinal blood pool activity: SUV max 2.1 Liver activity: SUV max NA NECK: No abnormal hypermetabolism. Incidental CT findings: Retention cyst or polyp in the left maxillary sinus. CHEST: No hypermetabolic mediastinal, hilar or axillary lymph nodes. No hypermetabolic pulmonary nodules. Incidental CT findings: Atherosclerotic calcification of  the aorta, aortic valve and coronary arteries. Heart is mildly enlarged. Mediastinal lymph nodes measure up to 11 mm in the AP window. Centrilobular and paraseptal emphysema. ABDOMEN/PELVIS: No abnormal hypermetabolism in the liver, adrenal glands, spleen or pancreas. No hypermetabolic lymph nodes. Incidental CT findings: Biliary ductal dilatation and pneumobilia. A stent is seen in the common bile duct. Adrenal glands are unremarkable. Subcentimeter low-attenuation lesions in the kidneys or characterized as simple cysts on 09/23/2020. Tiny stone in the left kidney. Spleen, pancreas, stomach and bowel are otherwise grossly unremarkable. Bladder wall thickening. Atherosclerotic calcification of the aorta. SKELETON: No abnormal hypermetabolism. Incidental CT findings: Degenerative changes in the spine. IMPRESSION: 1. No evidence of metastatic disease. Common bile duct mass, better seen and described on MR abdomen 09/23/2020. 2. Common bile duct stent  in place with persistent biliary ductal dilatation and associated pneumobilia. 3. Tiny left renal stone. 4. Bladder wall thickening. 5. Aortic atherosclerosis (ICD10-I70.0). Coronary artery calcification. 6.  Emphysema (ICD10-J43.9). Electronically Signed   By: Lorin Picket M.D.   On: 10/10/2020 09:10   DG ERCP BILIARY & PANCREATIC DUCTS  Result Date: 09/24/2020 CLINICAL DATA:  Biliary obstruction. EXAM: ERCP TECHNIQUE: Multiple spot images obtained with the fluoroscopic device and submitted for interpretation post-procedure. FLUOROSCOPY TIME:  Fluoroscopy Time:  2 minutes, 5 seconds COMPARISON:  MRI 09/23/2020 FINDINGS: Retrograde cholangiogram demonstrates irregularity and filling defects in the proximal common bile duct region/common hepatic duct region. Wire was advanced into the intrahepatic biliary system. A nonmetallic biliary stent was placed across the area of narrowing and irregularity. IMPRESSION: Irregular biliary stricture involving the proximal common  bile duct region. This is concerning for a malignant stricture based on the cholangiogram appearance and previous cross-sectional imaging. These images were submitted for radiologic interpretation only. Please see the procedural report for the amount of contrast and the fluoroscopy time utilized. Electronically Signed   By: Markus Daft M.D.   On: 09/24/2020 08:43   MR ABDOMEN MRCP W WO CONTAST  Result Date: 09/23/2020 CLINICAL DATA:  65 year old male with history of jaundice. EXAM: MRI ABDOMEN WITHOUT AND WITH CONTRAST (INCLUDING MRCP) TECHNIQUE: Multiplanar multisequence MR imaging of the abdomen was performed both before and after the administration of intravenous contrast. Heavily T2-weighted images of the biliary and pancreatic ducts were obtained, and three-dimensional MRCP images were rendered by post processing. CONTRAST:  78m GADAVIST GADOBUTROL 1 MMOL/ML IV SOLN COMPARISON:  CT the abdomen and pelvis 09/22/2020. FINDINGS: Comment: Portions of today's examination are severely limited by artifact from a large amount of patient respiratory motion. Lower chest: Unremarkable. Hepatobiliary: No suspicious cystic or solid hepatic lesions. However, MRCP images demonstrate moderate to severe intra hepatic biliary ductal dilatation. The ductal dilatation appears to abruptly terminate in the proximal common bile duct at or near the junction with the cystic duct (which is very poorly visualized on today's motion limited examination). The source of obstruction is poorly demonstrated, but appears to be a band like area of soft tissue encasing the duct, best appreciated on axial image 20 of series 5 where this is estimated to measure approximately 1.2 x 1.8 cm, and corresponds to areas of enhancement on post gadolinium imaging. Distal common bile duct is completely decompressed. No filling defects are noted in the distal common bile duct to suggest choledocholithiasis. Gallbladder is moderately distended with T1  hyperintense mildly T2 hyperintense material, which likely represents biliary sludge. Pancreas: No pancreatic mass. No pancreatic ductal dilatation noted on MRCP images. No pancreatic or peripancreatic fluid collections or inflammatory changes. Spleen:  Unremarkable. Adrenals/Urinary Tract: Subcentimeter T1 hypointense, T2 hyperintense, nonenhancing lesions in both kidneys are compatible with simple cysts. No hydroureteronephrosis in the visualized portions of the abdomen. Bilateral adrenal glands are normal in appearance. Stomach/Bowel: Visualized portions are unremarkable. Vascular/Lymphatic: No aneurysm identified in the visualized abdominal vasculature. No lymphadenopathy. Other: No significant volume of ascites noted in the visualized portions of the peritoneal cavity. Musculoskeletal: No aggressive appearing osseous lesions are noted in the visualized portions of the skeleton. IMPRESSION: 1. Obstructing soft tissue mass in the proximal common bile duct at or adjacent to the junction with the cystic duct, associated with moderate to severe intrahepatic biliary ductal dilatation indicating obstruction, as discussed above. 2. Biliary sludge filling the gallbladder lumen. Electronically Signed   By: DMauri BrooklynD.  On: 09/23/2020 06:27   US Abdomen Limited RUQ (LIVER/GB)  Result Date: 09/22/2020 CLINICAL DATA:  Hyperbilirubinemia EXAM: ULTRASOUND ABDOMEN LIMITED RIGHT UPPER QUADRANT COMPARISON:  Ultrasound 04/11/2017 FINDINGS: Gallbladder: The gallbladder is mildly distended, filled with material of varying echogenicity, likely sludge and tiny stones. No pericholecystic fluid or wall thickening. Sonographic Murphy's was not determined by the sonographer. Common bile duct: Diameter: Measures up to 11 mm proximally. Liver: There is intrahepatic biliary ductal dilation. Coarsened liver parenchyma in somewhat nodular left hepatic lobe. Portal vein is patent on color Doppler imaging with normal direction of  blood flow towards the liver. Other: None. IMPRESSION: Intrahepatic biliary ductal dilation and dilated proximal common bile duct. Findings are consistent with biliary obstruction, possibly due to a common bile duct mass, incompletely evaluated by ultrasound. Recommend multiphase CT or MRCP for further evaluation. Mildly distended gallbladder filled with material of varying echogenicity, likely sludge and tiny stones. In the absence of leukocytosis and right upper quadrant pain this is unlikely to represent cholecystitis. Coarsened liver echogenicity with somewhat nodular left hepatic lobe, compatible with chronic liver disease. Electronically Signed   By: Maurine Simmering   On: 09/22/2020 19:26    ASSESSMENT & PLAN:  [name] [name] is a 65 y.o.  male with a history of   1.  Extrahepatic cholangiocarcinoma, [id number] -presented to ED on 09/22/20 with jaundice and abdominal tenderness. He was found to have bilirubin of 15   -Abdomen MRI on 09/23/20 revealed obstructing soft tissue mass in proximal common bile duct. ERCP with bile duct brushing on 09/23/20 under Dr. Benson Norway confirmed adenocarcinoma.  He is status post CBD stent placement -PET scan and CT AP performed earlier today 10/10/20 showed no evidence of nodal or metastatic disease.  I reviewed with patient -They met with Dr. Zenia Resides on 10/05/20. She plans to offer Whipple procedure if CT scan shows no significant tumor invasion to liver or hepatic artery. CEA and CA 19-9 were normal. -We will review his scans from today at our multidisciplinary GI cancer conference this week to determine if he is a candidate for Whipple procedure. -We discussed that upfront surgery is the standard of care if it is resectable. -We reviewed the natural history of cholangiocarcinoma, and the high risk of recurrence after surgery. -We discussed adjuvant treatment with Xeloda for 6 months, per NCCN guideline.  We plan to start about 4-8 weeks after his surgery.  I discussed the benefit  and potential side effect of Xarelto, and gave him written material. -Follow-up 1 month after his Whipple surgery   2.  History of alcohol addiction, heavy smoking  -He was alcoholic, but has stopped completely.  He is still seeing psychiatrist and on medication for addiction  -he is a heavy smoker.  Has tried to switch to vaping but ultimately returned to smoking -we discussed cessation, as smoking will delay the healing process.   PLAN:  -PET and CT scan reviewed with patient -We will review his case in our GI tumor board this week -He will likely proceed with Whipple surgery by Dr. Zenia Resides soon -Follow-up one month after surgery, to discuss adjuvant Xeloda.   Orders Placed This Encounter  Procedures   CBC with Differential ([facility] Only)    Standing Status:   Standing    Number of Occurrences:   30    Standing Expiration Date:   10/10/2021   CMP ([facility] only)    Standing Status:   Standing    Number of Occurrences:  30    Standing Expiration Date:   10/10/2021   CA 19.9    Standing Status:   Standing    Number of Occurrences:   30    Standing Expiration Date:   10/10/2021     All questions were answered. The patient knows to call the clinic with any problems, questions or concerns. The total time spent in the appointment was 45 minutes.     [name], MD 10/10/2020 9:16 PM  I, Wilburn Mylar, am acting as scribe for [name], MD.   I have reviewed the above documentation for accuracy and completeness, and I agree with the above.

## 2020-10-12 ENCOUNTER — Ambulatory Visit: Payer: Self-pay | Admitting: Surgery

## 2020-10-12 ENCOUNTER — Other Ambulatory Visit: Payer: Self-pay

## 2020-10-12 NOTE — H&P (Signed)
History of Present Illness: [name] [name] is a 65 y.o. male who was referred to me with a newly-diagnosed cholangiocarcinoma. He presented with diarrhea, abdominal discomfort and jaundice in early August and was sent to the ED by his PCP, where lab workup showed a Tbili of 15. RUQ [place] and CT scan both showed biliary ductal dilation. An MRCP was done and showed a mass that appeared to be at the proximal common bile duct near the insertion of the cystic duct. He then underwent an ERCP by Dr. Benson Norway on 09/23/20, which confirmed the MRCP findings and showed a stricture in the proximal common bile duct with dilation of the hepatic ducts and intrahepatic biliary tree. The stricture was crossed with a wire and a plastic stent was placed. Brushings of the stricture showed malignant cells consistent with adenocarcinoma. His Tbili decreased to 6 the following day and he was discharged home.   Today he is feeling well. He has occasional RUQ discomfort. He endorses an 80-lb weight loss in the last year but says this was intentional. His jaundice has improved and urine has returned to a normal color. He has a history of alcohol abuse but quit drinking 10 months ago and remains active in [place]. Prior to quitting, he had heavy alcohol consumption for about 7 years. He has a history of COPD but never needs his rescue inhaler, and is not on any oxygen or daily inhalers. He can climb a flight of stairs with no dyspnea.   Review of Systems: A complete review of systems was obtained from the patient.  I have reviewed this information and discussed as appropriate with the patient.  See HPI as well for other ROS.       Medical History: Past Medical History Past Medical History: Diagnosis Date  Anxiety    COPD (chronic obstructive pulmonary disease) (CMS-HCC)    Diabetes mellitus without complication (CMS-HCC)    History of cancer    Hyperlipidemia    Sleep apnea    Substance abuse (CMS-HCC)        There is no problem  list on file for this patient.     Past Surgical History No past surgical history on file.     Allergies No Known Allergies    Current Outpatient Medications on File Prior to Visit Medication Sig Dispense Refill  busPIRone (BUSPAR) 5 MG tablet Take by mouth      folic acid (FOLVITE) 1 MG tablet Take by mouth      magnesium oxide (MAG-OX) 400 mg (241.3 mg magnesium) tablet Take by mouth      metFORMIN (GLUCOPHAGE) 1000 MG tablet TAKE 1 TABLET(1000 MG) BY MOUTH TWICE DAILY WITH A MEAL      clonazePAM (KLONOPIN) 0.5 MG tablet        cloNIDine HCL (CATAPRES) 0.1 MG tablet        naltrexone microspheres (VIVITROL) 380 mg extended release IM injectable suspension Inject into the muscle      PARoxetine (PAXIL) 10 MG tablet TAKE 1 TABLET BY MOUTH EVERY NIGHT AT BEDTIME IN ADDITION TO THE 40 MG      PARoxetine (PAXIL) 40 MG tablet        rosuvastatin (CRESTOR) 20 MG tablet        tiaGABine (GABITRIL) 4 MG tablet Take by mouth       No current facility-administered medications on file prior to visit.     Family History Family History Problem Relation Age of Onset  Diabetes Father  Coronary Artery Disease (Blocked arteries around heart) Father        Social History   Tobacco Use Smoking Status Smoker, Current Status Unknown  Packs/day: 1.00 Smokeless Tobacco Never Used     Social History Social History    Socioeconomic History  Marital status: Married Tobacco Use  Smoking status: Smoker, Current Status Unknown     Packs/day: 1.00  Smokeless tobacco: Never Used Vaping Use  Vaping Use: Never used Substance and Sexual Activity  Alcohol use: Not Currently  Drug use: Defer  Sexual activity: Defer      Objective:     Vitals:   10/05/20 1108 Pulse: 94 Temp: 36.8 C (98.2 F) SpO2: 100% Weight: 85.5 kg (188 lb 6.4 oz) Height: 177.8 cm (_0 )   Body mass index is 27.03 kg/m.   Physical Exam Vitals reviewed.  Constitutional:      General: He is not in  acute distress.    Appearance: Normal appearance.  HENT:     Head: Normocephalic and atraumatic.  Eyes:     General: No scleral icterus.    Conjunctiva/sclera: Conjunctivae normal.  Cardiovascular:     Rate and Rhythm: Normal rate and regular rhythm.     Heart sounds: No murmur heard. Pulmonary:     Effort: Pulmonary effort is normal. No respiratory distress.     Breath sounds: Normal breath sounds. No stridor. No wheezing.  Abdominal:     General: There is no distension.     Palpations: Abdomen is soft.     Comments: Mild RUQ tenderness to palpation. No surgical scars.  Musculoskeletal:        General: No swelling or deformity. Normal range of motion.     Cervical back: Normal range of motion.  Skin:    General: Skin is warm and dry.     Coloration: Skin is not jaundiced.  Neurological:     General: No focal deficit present.     Mental Status: He is alert and oriented to person, place, and time.  Psychiatric:        Mood and Affect: Mood normal.        Behavior: Behavior normal.        Thought Content: Thought content normal.          Assessment and Plan: Diagnoses and all orders for this visit:   Primary cholangiocarcinoma of extrahepatic bile duct (CMS-HCC) -     CT dual pancreas protocol incl CT abd pel w contrast -     CT chest with and without contrast with 3D MIPS -     CA 19-9 - [facility] -     Carcinoembryonic Antigen (CEA) -     CMP14+eGFR - [facility]       65 yo male with an extrahepatic cholangiocarcinoma. Jaundice has improved since placement of biliary stent. I personally reviewed his imaging, including his CT scan, MRCP, and ERCP. There appears to be a stricture at the proximal common bile duct near the cystic duct. This should be resectable via a Whipple, however the bile duct will need to be divided very proximal to obtain a clear margin. If the tumor extends more proximally or involves the right hepatic artery, a liver resection would be required to obtain  a clear margin. CT scan is single-phase and there is motion artifact on the MRI, making it difficult to clearly visualize the right hepatic artery. The liver does not appear cirrhotic and there no visible venous collaterals to suggest portal hypertension.  Since the available imaging is poor quality, will obtain a triple-phase CT scan of the abdomen to better evaluate the vasculature, as well as a staging CT scan of the chest. Will also repeat LFTs and obtain tumor markers today. He is already scheduled to see Dr. Burr Medico tomorrow and Dr. Lisbeth Renshaw next week. Will discuss case at multidisciplinary tumor board and review new imaging at that time.   I discussed with the patient and his wife that the ideal treatment for cholangiocarcinoma is up-front surgical resection, if the tumor is resectable and he has no metastases. He is an appropriate surgical candidate and I am hopeful this can be accomplished via a Whipple, but if the tumor involves the right hepatic artery or extends into the proximal hepatic ducts, he would require a major liver resection, which adds significant risk of morbidity and mortality. His liver does not appear diseased on imaging and on chart review his prior LFTs have been normal for the last several years. I will call him next week after obtaining further imaging workup and discussing at [facility].     Irish Breisch Awilda Metro, MD    I spent a total of 60 minutes in both face-to-face and non-face-to-face activities for this visit on the date of this encounter.     10/12/20: Case reviewed today at [facility], imaging include triple-phase CT reviewed and discussed with radiology. There does not appear to be any vascular invasion, specifically the right hepatic artery appears free of disease. Based on the location of the tumor in the mid-CBD, I believe this will be resectable via a Whipple alone. I called the patient and discussed this and will schedule him for surgery on 9/12. He expressed understanding and agrees  to proceed.

## 2020-10-12 NOTE — H&P (View-Only) (Signed)
History of Present Illness: [name] [name] is a 64 y.o. male who was referred to me with a newly-diagnosed cholangiocarcinoma. He presented with diarrhea, abdominal discomfort and jaundice in early August and was sent to the ED by his PCP, where lab workup showed a Tbili of 15. RUQ US and CT scan both showed biliary ductal dilation. An MRCP was done and showed a mass that appeared to be at the proximal common bile duct near the insertion of the cystic duct. He then underwent an ERCP by Dr. Hung on 09/23/20, which confirmed the MRCP findings and showed a stricture in the proximal common bile duct with dilation of the hepatic ducts and intrahepatic biliary tree. The stricture was crossed with a wire and a plastic stent was placed. Brushings of the stricture showed malignant cells consistent with adenocarcinoma. His Tbili decreased to 6 the following day and he was discharged home.   Today he is feeling well. He has occasional RUQ discomfort. He endorses an 80-lb weight loss in the last year but says this was intentional. His jaundice has improved and urine has returned to a normal color. He has a history of alcohol abuse but quit drinking 10 months ago and remains active in AA. Prior to quitting, he had heavy alcohol consumption for about 7 years. He has a history of COPD but never needs his rescue inhaler, and is not on any oxygen or daily inhalers. He can climb a flight of stairs with no dyspnea.   Review of Systems: A complete review of systems was obtained from the patient.  I have reviewed this information and discussed as appropriate with the patient.  See HPI as well for other ROS.       Medical History: Past Medical History Past Medical History: Diagnosis Date  Anxiety    COPD (chronic obstructive pulmonary disease) (CMS-HCC)    Diabetes mellitus without complication (CMS-HCC)    History of cancer    Hyperlipidemia    Sleep apnea    Substance abuse (CMS-HCC)        There is no problem  list on file for this patient.     Past Surgical History No past surgical history on file.     Allergies No Known Allergies    Current Outpatient Medications on File Prior to Visit Medication Sig Dispense Refill  busPIRone (BUSPAR) 5 MG tablet Take by mouth      folic acid (FOLVITE) 1 MG tablet Take by mouth      magnesium oxide (MAG-OX) 400 mg (241.3 mg magnesium) tablet Take by mouth      metFORMIN (GLUCOPHAGE) 1000 MG tablet TAKE 1 TABLET(1000 MG) BY MOUTH TWICE DAILY WITH A MEAL      clonazePAM (KLONOPIN) 0.5 MG tablet        cloNIDine HCL (CATAPRES) 0.1 MG tablet        naltrexone microspheres (VIVITROL) 380 mg extended release IM injectable suspension Inject into the muscle      PARoxetine (PAXIL) 10 MG tablet TAKE 1 TABLET BY MOUTH EVERY NIGHT AT BEDTIME IN ADDITION TO THE 40 MG      PARoxetine (PAXIL) 40 MG tablet        rosuvastatin (CRESTOR) 20 MG tablet        tiaGABine (GABITRIL) 4 MG tablet Take by mouth       No current facility-administered medications on file prior to visit.     Family History Family History Problem Relation Age of Onset  Diabetes Father      Coronary Artery Disease (Blocked arteries around heart) Father        Social History   Tobacco Use Smoking Status Smoker, Current Status Unknown  Packs/day: 1.00 Smokeless Tobacco Never Used     Social History Social History    Socioeconomic History  Marital status: Married Tobacco Use  Smoking status: Smoker, Current Status Unknown     Packs/day: 1.00  Smokeless tobacco: Never Used Vaping Use  Vaping Use: Never used Substance and Sexual Activity  Alcohol use: Not Currently  Drug use: Defer  Sexual activity: Defer      Objective:     Vitals:   10/05/20 1108 Pulse: 94 Temp: 36.8 C (98.2 F) SpO2: 100% Weight: 85.5 kg (188 lb 6.4 oz) Height: 177.8 cm (5' 10")   Body mass index is 27.03 kg/m.   Physical Exam Vitals reviewed.  Constitutional:      General: He is not in  acute distress.    Appearance: Normal appearance.  HENT:     Head: Normocephalic and atraumatic.  Eyes:     General: No scleral icterus.    Conjunctiva/sclera: Conjunctivae normal.  Cardiovascular:     Rate and Rhythm: Normal rate and regular rhythm.     Heart sounds: No murmur heard. Pulmonary:     Effort: Pulmonary effort is normal. No respiratory distress.     Breath sounds: Normal breath sounds. No stridor. No wheezing.  Abdominal:     General: There is no distension.     Palpations: Abdomen is soft.     Comments: Mild RUQ tenderness to palpation. No surgical scars.  Musculoskeletal:        General: No swelling or deformity. Normal range of motion.     Cervical back: Normal range of motion.  Skin:    General: Skin is warm and dry.     Coloration: Skin is not jaundiced.  Neurological:     General: No focal deficit present.     Mental Status: He is alert and oriented to person, place, and time.  Psychiatric:        Mood and Affect: Mood normal.        Behavior: Behavior normal.        Thought Content: Thought content normal.          Assessment and Plan: Diagnoses and all orders for this visit:   Primary cholangiocarcinoma of extrahepatic bile duct (CMS-HCC) -     CT dual pancreas protocol incl CT abd pel w contrast -     CT chest with and without contrast with 3D MIPS -     CA 19-9 - [facility] -     Carcinoembryonic Antigen (CEA) -     CMP14+eGFR - [facility]       64 yo male with an extrahepatic cholangiocarcinoma. Jaundice has improved since placement of biliary stent. I personally reviewed his imaging, including his CT scan, MRCP, and ERCP. There appears to be a stricture at the proximal common bile duct near the cystic duct. This should be resectable via a Whipple, however the bile duct will need to be divided very proximal to obtain a clear margin. If the tumor extends more proximally or involves the right hepatic artery, a liver resection would be required to obtain  a clear margin. CT scan is single-phase and there is motion artifact on the MRI, making it difficult to clearly visualize the right hepatic artery. The liver does not appear cirrhotic and there no visible venous collaterals to suggest portal hypertension.   Since the available imaging is poor quality, will obtain a triple-phase CT scan of the abdomen to better evaluate the vasculature, as well as a staging CT scan of the chest. Will also repeat LFTs and obtain tumor markers today. He is already scheduled to see Dr. Feng tomorrow and Dr. Moody next week. Will discuss case at multidisciplinary tumor board and review new imaging at that time.   I discussed with the patient and his wife that the ideal treatment for cholangiocarcinoma is up-front surgical resection, if the tumor is resectable and he has no metastases. He is an appropriate surgical candidate and I am hopeful this can be accomplished via a Whipple, but if the tumor involves the right hepatic artery or extends into the proximal hepatic ducts, he would require a major liver resection, which adds significant risk of morbidity and mortality. His liver does not appear diseased on imaging and on chart review his prior LFTs have been normal for the last several years. I will call him next week after obtaining further imaging workup and discussing at [facility].     [name] [name] LYNN Jeyren Danowski, MD    I spent a total of 60 minutes in both face-to-face and non-face-to-face activities for this visit on the date of this encounter.     10/12/20: Case reviewed today at [facility], imaging include triple-phase CT reviewed and discussed with radiology. There does not appear to be any vascular invasion, specifically the right hepatic artery appears free of disease. Based on the location of the tumor in the mid-CBD, I believe this will be resectable via a Whipple alone. I called the patient and discussed this and will schedule him for surgery on 9/12. He expressed understanding and agrees  to proceed.   

## 2020-10-12 NOTE — Progress Notes (Signed)
The proposed treatment discussed in conference is for discussion purpose only and is not a binding recommendation.  The patients have not been physically examined, or presented with their treatment options.  Therefore, final treatment plans cannot be decided.  

## 2020-10-18 ENCOUNTER — Ambulatory Visit (HOSPITAL_COMMUNITY): Admission: RE | Admit: 2020-10-18 | Payer: Federal, State, Local not specified - PPO | Source: Ambulatory Visit

## 2020-10-19 NOTE — Progress Notes (Signed)
HPI male smoker followed for OSA, COPD, Interstitial Fibrosis, tobacco use, complicated by obesity, PTSD/anxiety/depression, history syncope with RBBB/left hemiblock/ Dr Einar Gip  NPSG 11/12/1998- RDI/ AHI 75.8/hr. BiPAP 25/20 RDI 0/hr, tested in [place] PFT 06/14/11-within normal. FEV1 4.01/112%, FEV1/FVC 0.71, slight response to bronchodilator in small airways,  DLCO 87% Office Spirometry 06/24/17-WNL-FVC 4.74/95%, FEV1 3.21/85%, ratio 0.68, FEF 25-75% 2.09/67% PFT 11/05/19- minimal obstruction, no response to BD, moderate Diffusion deficit -------------------------------------------------------------------. 10/09/19-  66 year old male smoker(now Vaping) followed for OSA, COPD, Interstitial Fibrosis, tobacco use, complicated by obesity, PTSD/anxiety/depression, history syncope with RBBB/left hemiblock/ Dr Einar Gip, DM2,     CPAP auto 5-15/[employer] compliance 100%, AHI 2.9/ hr Body weight today 242 lbs Had J&J Covax Wife says he "gasps a lot" when up in the day.with exertion such as cutting grass. Denies wheeze or cough Has a rescue inhaler he never uses.  10/20/20- 65 year old male Smoker (16 pk yrs) followed for OSA, COPD, Interstitial Fibrosis, tobacco use, complicated by obesity, PTSD/anxiety/depression, history syncope with RBBB/left hemiblock/ Dr Einar Gip, DM2,  Cholangiocarcinoma   CPAP auto 5-15/[employer]-compliance 27%, AHI 2.7/ hr- mostly short nights, but uses 93% of nights,        averaging 3-4 hrs/ night. Body weight today-182 lbs Covid vax-3 [employer]izer  -----No complaint's He has lost 60 lbs over past year intentionally with diet, exercise and no alcohol (AA)  in months.  Falls asleep on couch before putting on CPAP. Not needing inhalers- breathing "ok". Pending Whipple for cholangiocarcinoma.  ROS-see HPI + = positive Constitutional:   +weight loss, night sweats, fevers, chills, fatigue, lassitude. HEENT:   No-  headaches, difficulty swallowing, tooth/dental problems,  sore throat,       No-  sneezing, itching, ear ache, +nasal congestion, post nasal drip,  CV:  No-   chest pain, orthopnea, PND, swelling in lower extremities, anasarca, dizziness, palpitations Resp:    shortness of breath with exertion or at rest.                 productive cough,   non-productive cough,  No- coughing up of blood.              No-   change in color of mucus.  No- wheezing.   Skin: No-   rash or lesions. GI:  No-   heartburn, indigestion, abdominal pain, nausea, vomiting,  GU:  MS:  No-   joint pain or swelling.  + back pain. Neuro-    No acute issue Psych:  No- change in mood or affect. +depression or anxiety.  No memory loss.  OBJ- Physical Exam General- Alert, Oriented, Affect-appropriate, Distress- none acute, +thinner Skin- rash-none, lesions- none, excoriation- none,  Lymphadenopathy- none Head- atraumatic            Eyes- Gross vision intact, PERRLA, conjunctivae and secretions clear            Ears- Hearing, canals-normal            Nose- +turbinate edema, no-Septal dev, mucus, polyps, erosion, perforation             Throat- Mallampati III , mucosa clear , drainage- none, tonsils- atrophic, + missing tooth Neck- flexible , trachea midline, no stridor , thyroid nl, carotid no bruit Chest - symmetrical excursion , unlabored           Heart/CV- RRR ,+ trace S murmur , no gallop  , no rub, nl s1 s2                           -  JVD- none , edema- none, stasis changes- none, varices- none           Lung- + few crackles, unlabored, wheeze- none, cough- none , dullness-none, rub- none, crackles-none           Chest wall-  Abd- Br/ Gen/ Rectal- Not done, not indicated Extrem- cyanosis- none, clubbing, none, atrophy- none, strength- nl Neuro- grossly intact to observation

## 2020-10-20 ENCOUNTER — Ambulatory Visit (INDEPENDENT_AMBULATORY_CARE_PROVIDER_SITE_OTHER): Payer: Federal, State, Local not specified - PPO

## 2020-10-20 ENCOUNTER — Encounter: Payer: Self-pay | Admitting: Internal Medicine

## 2020-10-20 ENCOUNTER — Ambulatory Visit: Payer: Federal, State, Local not specified - PPO | Admitting: Internal Medicine

## 2020-10-20 ENCOUNTER — Other Ambulatory Visit: Payer: Self-pay

## 2020-10-20 VITALS — BP 130/70 | HR 64 | Temp 98.1°F | Ht 70.0 in | Wt 182.0 lb

## 2020-10-20 DIAGNOSIS — C221 Intrahepatic bile duct carcinoma: Secondary | ICD-10-CM

## 2020-10-20 DIAGNOSIS — J449 Chronic obstructive pulmonary disease, unspecified: Secondary | ICD-10-CM

## 2020-10-20 DIAGNOSIS — G4733 Obstructive sleep apnea (adult) (pediatric): Secondary | ICD-10-CM | POA: Diagnosis not present

## 2020-10-20 DIAGNOSIS — J8410 Pulmonary fibrosis, unspecified: Secondary | ICD-10-CM

## 2020-10-20 NOTE — Patient Instructions (Signed)
Order- CXR    dx ILD, cholangiocarcinoma  Continue for now with CPAP auto 5-15. Try to use it at least 4 hours/ night if you can. When you come back we will consider whether you still need it.  Good luck with your surgery!!

## 2020-10-24 ENCOUNTER — Encounter: Payer: Self-pay | Admitting: *Deleted

## 2020-10-24 NOTE — Assessment & Plan Note (Addendum)
He denies cough, wheeze or DOE since weight loss. Consider update PFT

## 2020-10-24 NOTE — Assessment & Plan Note (Signed)
Mostly scarring, but discussed need to watch for progression Plan- CXR. Consider CT later, after Whipple.

## 2020-10-24 NOTE — Assessment & Plan Note (Addendum)
Significant weight loss. He wants to wait, but consider and HST in 6 months, after his surgery, to see if he still needs PAP.

## 2020-10-26 NOTE — Progress Notes (Signed)
Surgical Instructions    Your procedure is scheduled on 10/31/20.  Report to [facility] Main Entrance "A" at 05:30 A.M., then check in with the Admitting office.  Call this number if you have problems the morning of surgery:  [phone]   If you have any questions prior to your surgery date call [phone]: Open Monday-Friday 8am-4pm    Remember:  Do not eat after midnight the night before your surgery  You may drink clear liquids until 04:30 the morning of your surgery.   Clear liquids allowed are: Water, Non-Citrus Juices (without pulp), Carbonated Beverages, Clear Tea, Black Coffee ONLY (NO MILK, CREAM OR POWDERED CREAMER of any kind), and Gatorade  Patient Instructions  The night before surgery:  No food after midnight. ONLY clear liquids after midnight  The day of surgery (if you do NOT have diabetes):  Drink TWO Pre Surgery Clear Ensures the night before surgery. Drink ONE (1) Pre-Surgery Clear Ensure by 04:30am the morning of surgery. Drink in one sitting. Do not sip.  This drink was given to you during your hospital  pre-op appointment visit. Nothing else to drink after completing the  Pre-Surgery Clear Ensure the morning of surgery.           If you have questions, please contact your surgeon's office.     Take these medicines the morning of surgery with A SIP OF WATER  VASCEPA  IF NEEDED: albuterol (VENTOLIN HFA) 108 (90 Base) inhaler clonazePAM (KLONOPIN)    As of today, STOP taking any Aspirin (unless otherwise instructed by your surgeon) Aleve, Naproxen, Ibuprofen, Motrin, Advil, Goody's, BC's, all herbal medications, fish oil, and all vitamins.  WHAT DO I DO ABOUT MY DIABETES MEDICATION?   Do not take oral diabetes medicines (pills) the morning of surgery.     THE MORNING OF SURGERY, do not take metFORMIN (GLUCOPHAGE).  The day of surgery, do not take other diabetes injectables, including Byetta (exenatide), Bydureon (exenatide ER), Victoza  (liraglutide), or Trulicity (dulaglutide).  If your CBG is greater than 220 mg/dL, you may take  of your sliding scale (correction) dose of insulin.   HOW TO MANAGE YOUR DIABETES BEFORE AND AFTER SURGERY  Why is it important to control my blood sugar before and after surgery? Improving blood sugar levels before and after surgery helps healing and can limit problems. A way of improving blood sugar control is eating a healthy diet by:  Eating less sugar and carbohydrates  Increasing activity/exercise  Talking with your doctor about reaching your blood sugar goals High blood sugars (greater than 180 mg/dL) can raise your risk of infections and slow your recovery, so you will need to focus on controlling your diabetes during the weeks before surgery. Make sure that the doctor who takes care of your diabetes knows about your planned surgery including the date and location.  How do I manage my blood sugar before surgery? Check your blood sugar at least 4 times a day, starting 2 days before surgery, to make sure that the level is not too high or low.  Check your blood sugar the morning of your surgery when you wake up and every 2 hours until you get to the Short Stay unit.  If your blood sugar is less than 70 mg/dL, you will need to treat for low blood sugar: Do not take insulin. Treat a low blood sugar (less than 70 mg/dL) with  cup of clear juice (cranberry or apple), 4 glucose tablets, OR glucose gel. Recheck  blood sugar in 15 minutes after treatment (to make sure it is greater than 70 mg/dL). If your blood sugar is not greater than 70 mg/dL on recheck, call [phone] for further instructions. Report your blood sugar to the short stay nurse when you get to Short Stay.  If you are admitted to the hospital after surgery: Your blood sugar will be checked by the staff and you will probably be given insulin after surgery (instead of oral diabetes medicines) to make sure you have good blood  sugar levels. The goal for blood sugar control after surgery is 80-180 mg/dL.           Do not wear jewelry or makeup Do not wear lotions, powders, perfumes/colognes, or deodorant. Men may shave face and neck. Do not bring valuables to the hospital. DO Not wear nail polish, gel polish, artificial nails, or any other type of covering on natural nails including finger and toenails. If patients have artificial nails, gel coating, etc. that need to be removed by a nail salon please have this removed prior to surgery or surgery may need to be canceled/delayed if the surgeon/ anesthesia feels like the patient is unable to be adequately monitored.             [facility] is not responsible for any belongings or valuables.  Do NOT Smoke (Tobacco/Vaping)  24 hours prior to your procedure If you use a CPAP at night, you may bring all equipment for your overnight stay.   Contacts, glasses, dentures or bridgework may not be worn into surgery, please bring cases for these belongings   For patients admitted to the hospital, discharge time will be determined by your treatment team.   Patients discharged the day of surgery will not be allowed to drive home, and someone needs to stay with them for 24 hours.  ONLY 1 SUPPORT PERSON MAY BE PRESENT WHILE YOU ARE IN SURGERY. IF YOU ARE TO BE ADMITTED ONCE YOU ARE IN YOUR ROOM YOU WILL BE ALLOWED TWO (2) VISITORS.  Minor children may have two parents present. Special consideration for safety and communication needs will be reviewed on a case by case basis.  Special instructions:    Oral Hygiene is also important to reduce your risk of infection.  Remember - BRUSH YOUR TEETH THE MORNING OF SURGERY WITH YOUR REGULAR TOOTHPASTE   [facility]- Preparing For Surgery  Before surgery, you can play an important role. Because skin is not sterile, your skin needs to be as free of germs as possible. You can reduce the number of germs on your skin by washing with CHG  (chlorahexidine gluconate) Soap before surgery.  CHG is an antiseptic cleaner which kills germs and bonds with the skin to continue killing germs even after washing.     Please do not use if you have an allergy to CHG or antibacterial soaps. If your skin becomes reddened/irritated stop using the CHG.  Do not shave (including legs and underarms) for at least 48 hours prior to first CHG shower. It is OK to shave your face.  Please follow these instructions carefully.     Shower the NIGHT BEFORE SURGERY and the MORNING OF SURGERY with CHG Soap.   If you chose to wash your hair, wash your hair first as usual with your normal shampoo. After you shampoo, rinse your hair and body thoroughly to remove the shampoo.  Then [employer] and genitals (private parts) with your normal soap and rinse thoroughly to  remove soap.  After that Use CHG Soap as you would any other liquid soap. You can apply CHG directly to the skin and wash gently with a scrungie or a clean washcloth.   Apply the CHG Soap to your body ONLY FROM THE NECK DOWN.  Do not use on open wounds or open sores. Avoid contact with your eyes, ears, mouth and genitals (private parts). Wash Face and genitals (private parts)  with your normal soap.   Wash thoroughly, paying special attention to the area where your surgery will be performed.  Thoroughly rinse your body with warm water from the neck down.  DO NOT shower/wash with your normal soap after using and rinsing off the CHG Soap.  Pat yourself dry with a CLEAN TOWEL.  Wear CLEAN PAJAMAS to bed the night before surgery  Place CLEAN SHEETS on your bed the night before your surgery  DO NOT SLEEP WITH PETS.   Day of Surgery: Take a shower with CHG soap. Wear Clean/Comfortable clothing the morning of surgery Do not apply any deodorants/lotions.   Remember to brush your teeth WITH YOUR REGULAR TOOTHPASTE.   Please read over the following fact sheets that you were given.

## 2020-10-27 ENCOUNTER — Encounter (HOSPITAL_COMMUNITY)
Admission: RE | Admit: 2020-10-27 | Discharge: 2020-10-27 | Disposition: A | Discharge status: Home / Self Care | Payer: Federal, State, Local not specified - PPO | Source: Ambulatory Visit | Attending: Surgery | Admitting: Surgery

## 2020-10-27 ENCOUNTER — Other Ambulatory Visit: Payer: Self-pay

## 2020-10-27 ENCOUNTER — Encounter (HOSPITAL_COMMUNITY): Payer: Self-pay

## 2020-10-27 DIAGNOSIS — Z01818 Encounter for other preprocedural examination: Secondary | ICD-10-CM | POA: Insufficient documentation

## 2020-10-27 DIAGNOSIS — C221 Intrahepatic bile duct carcinoma: Secondary | ICD-10-CM | POA: Diagnosis not present

## 2020-10-27 HISTORY — DX: Gastro-esophageal reflux disease without esophagitis: K21.9

## 2020-10-27 HISTORY — DX: Malignant (primary) neoplasm, unspecified: C80.1

## 2020-10-27 HISTORY — DX: Bipolar disorder, unspecified: F31.9

## 2020-10-27 HISTORY — DX: Essential (primary) hypertension: I10

## 2020-10-27 LAB — CBC
HCT: 41.7 % (ref 39.0–52.0)
Hemoglobin: 14 g/dL (ref 13.0–17.0)
MCH: 35.4 pg — ABNORMAL HIGH (ref 26.0–34.0)
MCHC: 33.6 g/dL (ref 30.0–36.0)
MCV: 105.3 fL — ABNORMAL HIGH (ref 80.0–100.0)
Platelets: 201 10*3/uL (ref 150–400)
RBC: 3.96 MIL/uL — ABNORMAL LOW (ref 4.22–5.81)
RDW: 14.1 % (ref 11.5–15.5)
WBC: 5.2 10*3/uL (ref 4.0–10.5)
nRBC: 0 % (ref 0.0–0.2)

## 2020-10-27 LAB — COMPREHENSIVE METABOLIC PANEL
ALT: 17 U/L (ref 0–44)
AST: 27 U/L (ref 15–41)
Albumin: 3.6 g/dL (ref 3.5–5.0)
Alkaline Phosphatase: 83 U/L (ref 38–126)
Anion gap: 7 (ref 5–15)
BUN: 11 mg/dL (ref 8–23)
CO2: 27 mmol/L (ref 22–32)
Calcium: 8.7 mg/dL — ABNORMAL LOW (ref 8.9–10.3)
Chloride: 106 mmol/L (ref 98–111)
Creatinine, Ser: 0.63 mg/dL (ref 0.61–1.24)
GFR, Estimated: >60 mL/min (ref >60)
Glucose, Bld: 102 mg/dL — ABNORMAL HIGH (ref 70–99)
Potassium: 3.9 mmol/L (ref 3.5–5.1)
Sodium: 140 mmol/L (ref 135–145)
Total Bilirubin: 0.8 mg/dL (ref 0.3–1.2)
Total Protein: 6.5 g/dL (ref 6.5–8.1)

## 2020-10-27 LAB — GLUCOSE, CAPILLARY: Glucose-Capillary: 137 mg/dL — ABNORMAL HIGH (ref 70–99)

## 2020-10-27 NOTE — Progress Notes (Signed)
PCP - Dr. Pricilla Holm Cardiologist - Dr. Lyman Bishop Pulmonologist: Dr. Baird Lyons  PPM/ICD - N/A  Chest x-ray - N/A EKG - 10/27/20 Stress Test - 10/07/12 ECHO - 09/27/12 Cardiac Cath - Denies  Sleep Study - Yes, Pt has OSA CPAP - Yes  Fasting Blood Sugar - 110 Patient does not check his blood sugars often.  Blood Thinner Instructions: N/A Aspirin Instructions: LD: 10/27/20  ERAS Protcol - Yes, 2 waters the night before surgery and 1 water the morning of surgery.  COVID TEST- N/A; Positive COVID test on 09/22/20   Anesthesia review: Yes, cardiac hx  Patient denies shortness of breath, fever, cough and chest pain at PAT appointment   All instructions explained to the patient, with a verbal understanding of the material. Patient agrees to go over the instructions while at home for a better understanding. Patient also instructed to self quarantine after being tested for COVID-19. The opportunity to ask questions was provided.

## 2020-10-27 NOTE — Progress Notes (Addendum)
Surgical Instructions    Your procedure is scheduled on Monday, October 31, 2020 at 7:30 AM.  Report to [facility] Main Entrance "A" at 05:30 A.M., then check in with the Admitting office.  Call this number if you have problems the morning of surgery:  [phone]   If you have any questions prior to your surgery date call [phone]: Open Monday-Friday 8am-4pm    Remember:  Do not eat after midnight the night before your surgery  You may drink clear liquids until 04:30 the morning of your surgery.   Clear liquids allowed are: Water, Non-Citrus Juices (without pulp), Carbonated Beverages, Clear Tea, Black Coffee ONLY (NO MILK, CREAM OR POWDERED CREAMER of any kind), and Gatorade  Please drink TWO (2) of your 8 OZ WATERS that were provided to you the night before surgery. Drink your THIRD 8 OZ WATER the morning of surgery.      Take these medicines the morning of surgery with A SIP OF WATER  VASCEPA  IF NEEDED: albuterol (VENTOLIN HFA) 108 (90 Base) inhaler clonazePAM (KLONOPIN)   Follow your surgeon's instructions on when to stop Aspirin.  If no instructions were given by your surgeon then you will need to call the office to get those instructions.    As of today, STOP taking any Aleve, Naproxen, Ibuprofen, Motrin, Advil, Goody's, BC's, all herbal medications, fish oil, and all vitamins.  WHAT DO I DO ABOUT MY DIABETES MEDICATION?   Do not take metFORMIN (GLUCOPHAGE) the morning of surgery.     HOW TO MANAGE YOUR DIABETES BEFORE AND AFTER SURGERY  Why is it important to control my blood sugar before and after surgery? Improving blood sugar levels before and after surgery helps healing and can limit problems. A way of improving blood sugar control is eating a healthy diet by:  Eating less sugar and carbohydrates  Increasing activity/exercise  Talking with your doctor about reaching your blood sugar goals High blood sugars (greater than 180 mg/dL) can raise your risk  of infections and slow your recovery, so you will need to focus on controlling your diabetes during the weeks before surgery. Make sure that the doctor who takes care of your diabetes knows about your planned surgery including the date and location.  How do I manage my blood sugar before surgery? Check your blood sugar at least 4 times a day, starting 2 days before surgery, to make sure that the level is not too high or low.  Check your blood sugar the morning of your surgery when you wake up and every 2 hours until you get to the Short Stay unit.  If your blood sugar is less than 70 mg/dL, you will need to treat for low blood sugar: Do not take insulin. Treat a low blood sugar (less than 70 mg/dL) with  cup of clear juice (cranberry or apple), 4 glucose tablets, OR glucose gel. Recheck blood sugar in 15 minutes after treatment (to make sure it is greater than 70 mg/dL). If your blood sugar is not greater than 70 mg/dL on recheck, call [phone] for further instructions. Report your blood sugar to the short stay nurse when you get to Short Stay.  If you are admitted to the hospital after surgery: Your blood sugar will be checked by the staff and you will probably be given insulin after surgery (instead of oral diabetes medicines) to make sure you have good blood sugar levels. The goal for blood sugar control after surgery is 80-180 mg/dL.  Do not wear jewelry. Do not wear lotions, powders, colognes, or deodorant. Men may shave face and neck. Do not bring valuables to the hospital.             [facility] is not responsible for any belongings or valuables.  Do NOT Smoke (Tobacco/Vaping)  24 hours prior to your procedure If you use a CPAP at night, you may bring all equipment for your overnight stay.   Contacts, glasses, dentures or bridgework may not be worn into surgery, please bring cases for these belongings   For patients admitted to the hospital, discharge time will be  determined by your treatment team.   Patients discharged the day of surgery will not be allowed to drive home, and someone needs to stay with them for 24 hours.  ONLY 1 SUPPORT PERSON MAY BE PRESENT WHILE YOU ARE IN SURGERY. IF YOU ARE TO BE ADMITTED ONCE YOU ARE IN YOUR ROOM YOU WILL BE ALLOWED TWO (2) VISITORS.  Minor children may have two parents present. Special consideration for safety and communication needs will be reviewed on a case by case basis.  Special instructions:    Oral Hygiene is also important to reduce your risk of infection.  Remember - BRUSH YOUR TEETH THE MORNING OF SURGERY WITH YOUR REGULAR TOOTHPASTE   [facility]- Preparing For Surgery  Before surgery, you can play an important role. Because skin is not sterile, your skin needs to be as free of germs as possible. You can reduce the number of germs on your skin by washing with CHG (chlorahexidine gluconate) Soap before surgery.  CHG is an antiseptic cleaner which kills germs and bonds with the skin to continue killing germs even after washing.     Please do not use if you have an allergy to CHG or antibacterial soaps. If your skin becomes reddened/irritated stop using the CHG.  Do not shave (including legs and underarms) for at least 48 hours prior to first CHG shower. It is OK to shave your face.  Please follow these instructions carefully.     Shower the NIGHT BEFORE SURGERY and the MORNING OF SURGERY with CHG Soap.   If you chose to wash your hair, wash your hair first as usual with your normal shampoo. After you shampoo, rinse your hair and body thoroughly to remove the shampoo.  Then [employer] and genitals (private parts) with your normal soap and rinse thoroughly to remove soap.  After that Use CHG Soap as you would any other liquid soap. You can apply CHG directly to the skin and wash gently with a scrungie or a clean washcloth.   Apply the CHG Soap to your body ONLY FROM THE NECK DOWN.  Do not use on open  wounds or open sores. Avoid contact with your eyes, ears, mouth and genitals (private parts). Wash Face and genitals (private parts)  with your normal soap.   Wash thoroughly, paying special attention to the area where your surgery will be performed.  Thoroughly rinse your body with warm water from the neck down.  DO NOT shower/wash with your normal soap after using and rinsing off the CHG Soap.  Pat yourself dry with a CLEAN TOWEL.  Wear CLEAN PAJAMAS to bed the night before surgery  Place CLEAN SHEETS on your bed the night before your surgery  DO NOT SLEEP WITH PETS.   Day of Surgery: Take a shower with CHG soap. Wear Clean/Comfortable clothing the morning of surgery Do not apply  any deodorants/lotions.   Remember to brush your teeth WITH YOUR REGULAR TOOTHPASTE.   Please read over the following fact sheets that you were given.

## 2020-10-28 NOTE — Anesthesia Preprocedure Evaluation (Addendum)
Anesthesia Evaluation  Patient identified by MRN, date of birth, ID band Patient awake    Reviewed: Allergy & Precautions, H&P , NPO status , Patient's Chart, lab work & pertinent test results  Airway Mallampati: I  TM Distance: >3 FB Neck ROM: Full    Dental no notable dental hx. (+) Missing, Poor Dentition,    Pulmonary neg pulmonary ROS, sleep apnea and Continuous Positive Airway Pressure Ventilation , COPD,  COPD inhaler, Current Smoker,    Pulmonary exam normal breath sounds clear to auscultation       Cardiovascular Exercise Tolerance: Good hypertension, Pt. on medications Normal cardiovascular exam Rhythm:Regular Rate:Normal  Echo 2014: Left ventricle: The cavity size was normal. There was mild  concentric hypertrophy. Systolic function was normal. Wall  motion was normal; there were no regional wall motion  abnormalities.   Neuro/Psych PSYCHIATRIC DISORDERS Anxiety Depression Bipolar Disorder negative neurological ROS     GI/Hepatic GERD  Medicated and Controlled,(+)     substance abuse  alcohol use,   Endo/Other  diabetes, Type 2  Renal/GU   negative genitourinary   Musculoskeletal  (+) Arthritis , Osteoarthritis,    Abdominal   Peds negative pediatric ROS (+)  Hematology negative hematology ROS (+)   Anesthesia Other Findings   Reproductive/Obstetrics negative OB ROS                          Anesthesia Physical Anesthesia Plan  ASA: 3  Anesthesia Plan: General   Post-op Pain Management: GA combined w/ Regional for post-op pain   Induction: Intravenous  PONV Risk Score and Plan: 2 and Ondansetron and Treatment may vary due to age or medical condition  Airway Management Planned: Oral ETT  Additional Equipment: Arterial line  Intra-op Plan:   Post-operative Plan: Extubation in OR  Informed Consent: I have reviewed the patients History and Physical, chart,  labs and discussed the procedure including the risks, benefits and alternatives for the proposed anesthesia with the patient or authorized representative who has indicated his/her understanding and acceptance.       Plan Discussed with: Anesthesiologist  Anesthesia Plan Comments: (See APP note by Durel Salts, FNP  Pt is a 65 year old with hx of HTN, carotid artery disease, DM, interstitial pulmonary fibrosis, COPD, OSA, history of alcohol abuse Hospitalized 8/5-6/22 for abdominal pain, found to have biliary obstruction due to an apparently malignant soft tissue mass, underwent biliary sphincterotomy  ART line or Clear site TBD)       Anesthesia Quick Evaluation

## 2020-10-28 NOTE — Progress Notes (Addendum)
Anesthesia Chart Review:   Case: [id number] Date/Time: 10/31/20 0715   Procedures:      STAGING LAPAROSCOPY - GEN AND TAP BLOCK     WHIPPLE PROCEDURE, INTRAOPERATIVE ULTRASOUND   Anesthesia type: General   Pre-op diagnosis: CHOLANGIOCARCINOMA   Location: [facility] ROOM 02 / [facility]   Surgeons: Dwan Bolt, MD       DISCUSSION: Pt is a 65 year old with hx of HTN, carotid artery disease, DM, interstitial pulmonary fibrosis, COPD, OSA, history of alcohol abuse  Hospitalized 8/5-6/22 for abdominal pain, found to have biliary obstruction due to an apparently malignant soft tissue mass, underwent biliary sphincterotomy  VS: BP (!) 148/92   Pulse 85   Temp 37 C (Oral)   Resp 18   Ht '5\' 10"'$  (1.778 m)   Wt 82.6 kg   SpO2 99%   BMI 26.11 kg/m   PROVIDERS: - PCP is Hoyt Koch, MD - Cardiologist is Lyman Bishop, MD for hyperlipidemia. Last office visit 05/20/20 - Pulmonologist is Baird Lyons, MD who is aware of upcoming surgery. Last office visit 10/20/20   LABS: Labs reviewed: Acceptable for surgery. (all labs ordered are listed, but only abnormal results are displayed)  Labs Reviewed  GLUCOSE, CAPILLARY - Abnormal; Notable for the following components:      Result Value   Glucose-Capillary 137 (*)    All other components within normal limits  CBC - Abnormal; Notable for the following components:   RBC 3.96 (*)    MCV 105.3 (*)    MCH 35.4 (*)    All other components within normal limits  COMPREHENSIVE METABOLIC PANEL - Abnormal; Notable for the following components:   Glucose, Bld 102 (*)    Calcium 8.7 (*)    All other components within normal limits  TYPE AND SCREEN     IMAGES: CXR 10/20/20:  - Stable bibasilar probable scarring. No definite acute abnormality seen.  CT abd pelvis 10/10/20:  - No suspicious liver lesions. Replaced right hepatic artery arising from the SMA. - Common bile duct tumor appears unchanged compared to prior CT. - Common bile duct stent  place. - Findings of fibrotic interstitial lung disease in the partially visualized lower chest, could be further evaluated with dedicated high-resolution chest CT.   EKG 10/27/20: NSR. Left axis deviation. RBBB. Minimal voltage criteria for LVH, may be normal variant ( R in aVL ). Anterior infarct , age undetermined. Since prior tracing 2014, QRS is increased   CV: Carotid duplex 12/04/17:  - Right Carotid: Velocities in the right ICA are consistent with a 1-39% stenosis. Non-hemodynamically significant plaque <50% noted in the CCA. The RICA velocities remain within normal range and stable compared to the prior exam.  - Left Carotid: Velocities in the left ICA are consistent with a 1-39% stenosis. Non-hemodynamically significant plaque noted in the CCA. The LICA velocities remain within normal range and stable compared to the prior exam.  - Vertebrals:  Bilateral vertebral arteries demonstrate antegrade flow.  - Subclavians: Normal flow hemodynamics were seen in bilateral subclavian arteries.    Past Medical History:  Diagnosis Date   Alcoholism ([place])    Anxiety    Bipolar disorder ([place])    Cancer ([place])    COPD (chronic obstructive pulmonary disease) ([place])    Depression    " post traumatic stress disorder" sees MD in [place] every 3-6 months.   Diabetes mellitus without complication (HCC)    Dysrhythmia    hx. Bundle  branch block- saw Dr. Kathlee Nations   GERD (gastroesophageal reflux disease)    Hypertension    Irregular heart rate    Sleep apnea    cpap used with nose clip- Dr. Baird Lyons follows   Substance abuse [facility])    "tends to self medicate(Rx. meds or Alcohol) trying to get relief from "PSTD"    Past Surgical History:  Procedure Laterality Date   BILIARY BRUSHING  09/23/2020   Procedure: BILIARY BRUSHING;  Surgeon: Carol Ada, MD;  Location: [facility];  Service: Endoscopy;;   BILIARY STENT PLACEMENT N/A 09/23/2020   Procedure: BILIARY STENT PLACEMENT;   Surgeon: Carol Ada, MD;  Location: [facility];  Service: Endoscopy;  Laterality: N/A;   COLONOSCOPY WITH PROPOFOL N/A 01/19/2014   Procedure: COLONOSCOPY WITH PROPOFOL;  Surgeon: Juanita Craver, MD;  Location: [facility];  Service: Endoscopy;  Laterality: N/A;   ERCP N/A 09/23/2020   Procedure: ENDOSCOPIC RETROGRADE CHOLANGIOPANCREATOGRAPHY (ERCP);  Surgeon: Carol Ada, MD;  Location: [facility];  Service: Endoscopy;  Laterality: N/A;   SPHINCTEROTOMY  09/23/2020   Procedure: SPHINCTEROTOMY;  Surgeon: Carol Ada, MD;  Location: [facility];  Service: Endoscopy;;   WISDOM TOOTH EXTRACTION     65 years old    MEDICATIONS:  albuterol (VENTOLIN HFA) 108 (90 Base) MCG/ACT inhaler   Ascorbic Acid (VITAMIN C) 1000 MG tablet   aspirin EC 81 MG tablet   busPIRone (BUSPAR) 5 MG tablet   Cholecalciferol (VITAMIN D) 2000 UNITS tablet   CHROMIUM PO   clonazePAM (KLONOPIN) 0.5 MG tablet   cloNIDine (CATAPRES) 0.1 MG tablet   doxylamine, Sleep, (UNISOM) 25 MG tablet   ferrous sulfate 0000000 MG TBEC   folic acid (FOLVITE) 1 MG tablet   magnesium gluconate (MAGONATE) 500 MG tablet   metFORMIN (GLUCOPHAGE) 1000 MG tablet   Multiple Vitamin (MULTIVITAMIN WITH MINERALS) TABS tablet   Naltrexone (VIVITROL) 380 MG SUSR   PARoxetine (PAXIL) 30 MG tablet   Potassium 99 MG TABS   QUEtiapine (SEROQUEL) 50 MG tablet   tiaGABine (GABITRIL) 4 MG tablet   VASCEPA 1 g capsule   vitamin B-12 (CYANOCOBALAMIN) 500 MCG tablet   No current facility-administered medications for this encounter.    If no changes, I anticipate pt can proceed with surgery as scheduled.   Willeen Cass, PhD, FNP-BC [facility]/Anesthesiology Phone: [phone] 10/28/2020 11:25 AM

## 2020-10-31 ENCOUNTER — Inpatient Hospital Stay (HOSPITAL_COMMUNITY)
Admission: RE | Admit: 2020-10-31 | Discharge: 2020-11-07 | DRG: 405 | Disposition: A | Discharge status: Home / Self Care | Payer: Medicare Other | Attending: Surgery | Admitting: Surgery

## 2020-10-31 ENCOUNTER — Inpatient Hospital Stay (HOSPITAL_COMMUNITY): Payer: Medicare Other | Admitting: Emergency Medicine

## 2020-10-31 ENCOUNTER — Encounter (HOSPITAL_COMMUNITY)
Admission: RE | Disposition: A | Discharge status: Home / Self Care | Payer: Self-pay | Source: Home / Self Care | Attending: Surgery

## 2020-10-31 ENCOUNTER — Inpatient Hospital Stay (HOSPITAL_COMMUNITY): Payer: Medicare Other | Admitting: Anesthesiology

## 2020-10-31 ENCOUNTER — Encounter (HOSPITAL_COMMUNITY): Payer: Self-pay | Admitting: Surgery

## 2020-10-31 ENCOUNTER — Other Ambulatory Visit: Payer: Self-pay

## 2020-10-31 DIAGNOSIS — Z833 Family history of diabetes mellitus: Secondary | ICD-10-CM

## 2020-10-31 DIAGNOSIS — F419 Anxiety disorder, unspecified: Secondary | ICD-10-CM | POA: Diagnosis present

## 2020-10-31 DIAGNOSIS — F32A Depression, unspecified: Secondary | ICD-10-CM | POA: Diagnosis present

## 2020-10-31 DIAGNOSIS — K831 Obstruction of bile duct: Secondary | ICD-10-CM | POA: Diagnosis present

## 2020-10-31 DIAGNOSIS — C221 Intrahepatic bile duct carcinoma: Secondary | ICD-10-CM | POA: Diagnosis present

## 2020-10-31 DIAGNOSIS — E119 Type 2 diabetes mellitus without complications: Secondary | ICD-10-CM | POA: Diagnosis present

## 2020-10-31 DIAGNOSIS — Z8249 Family history of ischemic heart disease and other diseases of the circulatory system: Secondary | ICD-10-CM | POA: Diagnosis not present

## 2020-10-31 DIAGNOSIS — K760 Fatty (change of) liver, not elsewhere classified: Secondary | ICD-10-CM | POA: Diagnosis present

## 2020-10-31 DIAGNOSIS — E785 Hyperlipidemia, unspecified: Secondary | ICD-10-CM | POA: Diagnosis present

## 2020-10-31 DIAGNOSIS — E876 Hypokalemia: Secondary | ICD-10-CM | POA: Diagnosis not present

## 2020-10-31 DIAGNOSIS — G4733 Obstructive sleep apnea (adult) (pediatric): Secondary | ICD-10-CM | POA: Diagnosis present

## 2020-10-31 DIAGNOSIS — R112 Nausea with vomiting, unspecified: Secondary | ICD-10-CM | POA: Diagnosis not present

## 2020-10-31 DIAGNOSIS — J449 Chronic obstructive pulmonary disease, unspecified: Secondary | ICD-10-CM | POA: Diagnosis present

## 2020-10-31 DIAGNOSIS — F17210 Nicotine dependence, cigarettes, uncomplicated: Secondary | ICD-10-CM | POA: Diagnosis present

## 2020-10-31 HISTORY — PX: WHIPPLE PROCEDURE: SHX2667

## 2020-10-31 HISTORY — PX: LAPAROSCOPY: SHX197

## 2020-10-31 LAB — COMPREHENSIVE METABOLIC PANEL
ALT: 43 U/L (ref 0–44)
AST: 60 U/L — ABNORMAL HIGH (ref 15–41)
Albumin: 3.5 g/dL (ref 3.5–5.0)
Alkaline Phosphatase: 49 U/L (ref 38–126)
Anion gap: 9 (ref 5–15)
BUN: 16 mg/dL (ref 8–23)
CO2: 24 mmol/L (ref 22–32)
Calcium: 8 mg/dL — ABNORMAL LOW (ref 8.9–10.3)
Chloride: 105 mmol/L (ref 98–111)
Creatinine, Ser: 0.58 mg/dL — ABNORMAL LOW (ref 0.61–1.24)
GFR, Estimated: >60 mL/min (ref >60)
Glucose, Bld: 179 mg/dL — ABNORMAL HIGH (ref 70–99)
Potassium: 4 mmol/L (ref 3.5–5.1)
Sodium: 138 mmol/L (ref 135–145)
Total Bilirubin: 0.9 mg/dL (ref 0.3–1.2)
Total Protein: 5.7 g/dL — ABNORMAL LOW (ref 6.5–8.1)

## 2020-10-31 LAB — GLUCOSE, CAPILLARY
Glucose-Capillary: 132 mg/dL — ABNORMAL HIGH (ref 70–99)
Glucose-Capillary: 146 mg/dL — ABNORMAL HIGH (ref 70–99)
Glucose-Capillary: 168 mg/dL — ABNORMAL HIGH (ref 70–99)
Glucose-Capillary: 96 mg/dL (ref 70–99)

## 2020-10-31 LAB — MRSA NEXT GEN BY PCR, NASAL: MRSA by PCR Next Gen: NOT DETECTED

## 2020-10-31 LAB — CBC
HCT: 43.8 % (ref 39.0–52.0)
Hemoglobin: 14.5 g/dL (ref 13.0–17.0)
MCH: 34.9 pg — ABNORMAL HIGH (ref 26.0–34.0)
MCHC: 33.1 g/dL (ref 30.0–36.0)
MCV: 105.5 fL — ABNORMAL HIGH (ref 80.0–100.0)
Platelets: 223 10*3/uL (ref 150–400)
RBC: 4.15 MIL/uL — ABNORMAL LOW (ref 4.22–5.81)
RDW: 13.9 % (ref 11.5–15.5)
WBC: 13.1 10*3/uL — ABNORMAL HIGH (ref 4.0–10.5)
nRBC: 0 % (ref 0.0–0.2)

## 2020-10-31 LAB — PREPARE RBC (CROSSMATCH)

## 2020-10-31 LAB — ABO/RH: ABO/RH(D): O POS

## 2020-10-31 SURGERY — LAPAROSCOPY, DIAGNOSTIC
Anesthesia: General

## 2020-10-31 MED ORDER — LIDOCAINE 2% (20 MG/ML) 5 ML SYRINGE
INTRAMUSCULAR | Status: AC
  Filled 2020-10-31: qty 5

## 2020-10-31 MED ORDER — ORAL CARE MOUTH RINSE
15.0000 mL | Freq: Two times a day (BID) | OROMUCOSAL | Status: DC
  Administered 2020-11-01 – 2020-11-06 (×10): 15 mL via OROMUCOSAL

## 2020-10-31 MED ORDER — MIDAZOLAM HCL 2 MG/2ML IJ SOLN
INTRAMUSCULAR | Status: AC
  Filled 2020-10-31: qty 2

## 2020-10-31 MED ORDER — FENTANYL CITRATE (PF) 250 MCG/5ML IJ SOLN
INTRAMUSCULAR | Status: AC
  Filled 2020-10-31: qty 5

## 2020-10-31 MED ORDER — PHENYLEPHRINE 40 MCG/ML (10ML) SYRINGE FOR IV PUSH (FOR BLOOD PRESSURE SUPPORT)
PREFILLED_SYRINGE | INTRAVENOUS | Status: AC
  Filled 2020-10-31: qty 10

## 2020-10-31 MED ORDER — PHENYLEPHRINE HCL-NACL 20-0.9 MG/250ML-% IV SOLN
INTRAVENOUS | Status: DC | PRN
  Administered 2020-10-31: 10 ug/min via INTRAVENOUS

## 2020-10-31 MED ORDER — OXYCODONE HCL 5 MG/5ML PO SOLN: 5.0000 mg | Freq: Once | ORAL | Status: DC | PRN

## 2020-10-31 MED ORDER — FENTANYL CITRATE (PF) 100 MCG/2ML IJ SOLN: 25.0000 ug | INTRAMUSCULAR | Status: DC | PRN

## 2020-10-31 MED ORDER — MEPERIDINE HCL 25 MG/ML IJ SOLN: 6.2500 mg | INTRAMUSCULAR | Status: DC | PRN

## 2020-10-31 MED ORDER — ONDANSETRON HCL 4 MG/2ML IJ SOLN
4.0000 mg | Freq: Four times a day (QID) | INTRAMUSCULAR | Status: DC | PRN
  Administered 2020-10-31 – 2020-11-03 (×4): 4 mg via INTRAVENOUS
  Filled 2020-10-31 (×4): qty 2

## 2020-10-31 MED ORDER — ESMOLOL HCL 100 MG/10ML IV SOLN
INTRAVENOUS | Status: AC
  Filled 2020-10-31: qty 10

## 2020-10-31 MED ORDER — LACTATED RINGERS IV SOLN: INTRAVENOUS | Status: DC

## 2020-10-31 MED ORDER — ALBUTEROL SULFATE HFA 108 (90 BASE) MCG/ACT IN AERS
INHALATION_SPRAY | RESPIRATORY_TRACT | Status: DC | PRN
  Administered 2020-10-31: 4 via RESPIRATORY_TRACT

## 2020-10-31 MED ORDER — INSULIN ASPART 100 UNIT/ML IJ SOLN
0.0000 [IU] | INTRAMUSCULAR | Status: DC
  Administered 2020-10-31: 3 [IU] via SUBCUTANEOUS
  Administered 2020-11-01 (×3): 2 [IU] via SUBCUTANEOUS
  Administered 2020-11-01: 3 [IU] via SUBCUTANEOUS
  Administered 2020-11-01 – 2020-11-05 (×13): 2 [IU] via SUBCUTANEOUS

## 2020-10-31 MED ORDER — 0.9 % SODIUM CHLORIDE (POUR BTL) OPTIME
TOPICAL | Status: DC | PRN
  Administered 2020-10-31: 2000 mL

## 2020-10-31 MED ORDER — ALBUMIN HUMAN 5 % IV SOLN: INTRAVENOUS | Status: DC | PRN

## 2020-10-31 MED ORDER — ASPIRIN EC 81 MG PO TBEC
81.0000 mg | DELAYED_RELEASE_TABLET | Freq: Every day | ORAL | Status: DC
  Administered 2020-11-01 – 2020-11-07 (×7): 81 mg via ORAL
  Filled 2020-10-31 (×7): qty 1

## 2020-10-31 MED ORDER — ENOXAPARIN SODIUM 40 MG/0.4ML IJ SOSY
40.0000 mg | PREFILLED_SYRINGE | INTRAMUSCULAR | Status: DC
  Administered 2020-11-01 – 2020-11-07 (×7): 40 mg via SUBCUTANEOUS
  Filled 2020-10-31 (×7): qty 0.4

## 2020-10-31 MED ORDER — HYDROMORPHONE 1 MG/ML IV SOLN
INTRAVENOUS | Status: DC
  Administered 2020-10-31: 0.6 mg via INTRAVENOUS
  Administered 2020-10-31: 0.2 mg via INTRAVENOUS
  Administered 2020-11-01 (×2): 0.4 mg via INTRAVENOUS
  Administered 2020-11-01: 1 mg via INTRAVENOUS
  Administered 2020-11-01: 2 mg via INTRAVENOUS
  Administered 2020-11-02: 1.6 mg via INTRAVENOUS
  Administered 2020-11-02: 0.8 mg via INTRAVENOUS

## 2020-10-31 MED ORDER — QUETIAPINE FUMARATE 50 MG PO TABS
50.0000 mg | ORAL_TABLET | Freq: Every day | ORAL | Status: DC
  Administered 2020-11-01 – 2020-11-06 (×6): 50 mg via ORAL
  Filled 2020-10-31 (×6): qty 1

## 2020-10-31 MED ORDER — ALBUTEROL SULFATE (2.5 MG/3ML) 0.083% IN NEBU: 3.0000 mL | INHALATION_SOLUTION | Freq: Four times a day (QID) | RESPIRATORY_TRACT | Status: DC | PRN

## 2020-10-31 MED ORDER — ACETAMINOPHEN 325 MG PO TABS: 325.0000 mg | ORAL_TABLET | ORAL | Status: DC | PRN

## 2020-10-31 MED ORDER — ACETAMINOPHEN 160 MG/5ML PO SOLN: 325.0000 mg | ORAL | Status: DC | PRN

## 2020-10-31 MED ORDER — ROCURONIUM BROMIDE 10 MG/ML (PF) SYRINGE
PREFILLED_SYRINGE | INTRAVENOUS | Status: AC
  Filled 2020-10-31: qty 40

## 2020-10-31 MED ORDER — ONDANSETRON 4 MG PO TBDP
4.0000 mg | ORAL_TABLET | Freq: Four times a day (QID) | ORAL | Status: DC | PRN
  Filled 2020-10-31: qty 1

## 2020-10-31 MED ORDER — HEMOSTATIC AGENTS (NO CHARGE) OPTIME
TOPICAL | Status: DC | PRN
  Administered 2020-10-31 (×3): 1 via TOPICAL

## 2020-10-31 MED ORDER — ONDANSETRON HCL 4 MG/2ML IJ SOLN
INTRAMUSCULAR | Status: DC | PRN
  Administered 2020-10-31: 4 mg via INTRAVENOUS

## 2020-10-31 MED ORDER — KETAMINE HCL 50 MG/5ML IJ SOSY
PREFILLED_SYRINGE | INTRAMUSCULAR | Status: AC
  Filled 2020-10-31: qty 5

## 2020-10-31 MED ORDER — PAROXETINE HCL 20 MG PO TABS
60.0000 mg | ORAL_TABLET | Freq: Every day | ORAL | Status: DC
  Administered 2020-11-01 – 2020-11-06 (×6): 60 mg via ORAL
  Filled 2020-10-31: qty 2
  Filled 2020-10-31: qty 3
  Filled 2020-10-31 (×4): qty 2
  Filled 2020-10-31: qty 3

## 2020-10-31 MED ORDER — PANTOPRAZOLE SODIUM 40 MG IV SOLR
40.0000 mg | INTRAVENOUS | Status: DC
  Administered 2020-10-31 – 2020-11-03 (×4): 40 mg via INTRAVENOUS
  Filled 2020-10-31 (×4): qty 40

## 2020-10-31 MED ORDER — METRONIDAZOLE 500 MG/100ML IV SOLN
500.0000 mg | INTRAVENOUS | Status: AC
  Administered 2020-10-31: 500 mg via INTRAVENOUS
  Filled 2020-10-31: qty 100

## 2020-10-31 MED ORDER — ONDANSETRON HCL 4 MG/2ML IJ SOLN
INTRAMUSCULAR | Status: AC
  Filled 2020-10-31: qty 2

## 2020-10-31 MED ORDER — LIDOCAINE 2% (20 MG/ML) 5 ML SYRINGE
INTRAMUSCULAR | Status: DC | PRN
  Administered 2020-10-31: 60 mg via INTRAVENOUS

## 2020-10-31 MED ORDER — SUGAMMADEX SODIUM 200 MG/2ML IV SOLN
INTRAVENOUS | Status: DC | PRN
  Administered 2020-10-31: 200 mg via INTRAVENOUS

## 2020-10-31 MED ORDER — LACTATED RINGERS IV SOLN: INTRAVENOUS | Status: DC | PRN

## 2020-10-31 MED ORDER — MIDAZOLAM HCL 2 MG/2ML IJ SOLN
INTRAMUSCULAR | Status: DC | PRN
  Administered 2020-10-31: 2 mg via INTRAVENOUS

## 2020-10-31 MED ORDER — ENSURE PRE-SURGERY PO LIQD
296.0000 mL | Freq: Once | ORAL | Status: DC
Start: 2020-11-01 — End: 2020-10-31

## 2020-10-31 MED ORDER — DEXAMETHASONE SODIUM PHOSPHATE 10 MG/ML IJ SOLN
INTRAMUSCULAR | Status: DC | PRN
Start: 2020-10-31 — End: 2020-10-31
  Administered 2020-10-31: 10 mg via INTRAVENOUS

## 2020-10-31 MED ORDER — PROPOFOL 10 MG/ML IV BOLUS
INTRAVENOUS | Status: AC
  Filled 2020-10-31: qty 40

## 2020-10-31 MED ORDER — IPRATROPIUM-ALBUTEROL 0.5-2.5 (3) MG/3ML IN SOLN
3.0000 mL | Freq: Four times a day (QID) | RESPIRATORY_TRACT | Status: DC
  Administered 2020-10-31 – 2020-11-02 (×7): 3 mL via RESPIRATORY_TRACT
  Filled 2020-10-31 (×7): qty 3

## 2020-10-31 MED ORDER — PHENYLEPHRINE 40 MCG/ML (10ML) SYRINGE FOR IV PUSH (FOR BLOOD PRESSURE SUPPORT)
PREFILLED_SYRINGE | INTRAVENOUS | Status: DC | PRN
  Administered 2020-10-31 (×4): 80 ug via INTRAVENOUS

## 2020-10-31 MED ORDER — BUPIVACAINE HCL (PF) 0.25 % IJ SOLN
INTRAMUSCULAR | Status: DC | PRN
  Administered 2020-10-31 (×2): 30 mL via EPIDURAL

## 2020-10-31 MED ORDER — OXYCODONE HCL 5 MG PO TABS: 5.0000 mg | ORAL_TABLET | Freq: Once | ORAL | Status: DC | PRN

## 2020-10-31 MED ORDER — CHLORHEXIDINE GLUCONATE 0.12 % MT SOLN
15.0000 mL | Freq: Two times a day (BID) | OROMUCOSAL | Status: DC
  Administered 2020-10-31 – 2020-11-07 (×14): 15 mL via OROMUCOSAL
  Filled 2020-10-31 (×12): qty 15

## 2020-10-31 MED ORDER — ENSURE PRE-SURGERY PO LIQD: 592.0000 mL | Freq: Once | ORAL | Status: DC

## 2020-10-31 MED ORDER — CHLORHEXIDINE GLUCONATE 0.12 % MT SOLN
15.0000 mL | Freq: Once | OROMUCOSAL | Status: AC
  Administered 2020-10-31: 15 mL via OROMUCOSAL
  Filled 2020-10-31: qty 15

## 2020-10-31 MED ORDER — ESMOLOL HCL 100 MG/10ML IV SOLN
INTRAVENOUS | Status: DC | PRN
  Administered 2020-10-31: 10 mg via INTRAVENOUS

## 2020-10-31 MED ORDER — CHLORHEXIDINE GLUCONATE CLOTH 2 % EX PADS
6.0000 | MEDICATED_PAD | Freq: Every day | CUTANEOUS | Status: DC
  Administered 2020-10-31 – 2020-11-07 (×6): 6 via TOPICAL

## 2020-10-31 MED ORDER — SODIUM CHLORIDE 0.9 % IV SOLN
2.0000 g | INTRAVENOUS | Status: AC
  Administered 2020-10-31: 2 g via INTRAVENOUS
  Filled 2020-10-31: qty 20

## 2020-10-31 MED ORDER — SODIUM CHLORIDE 0.9% FLUSH: 9.0000 mL | INTRAVENOUS | Status: DC | PRN

## 2020-10-31 MED ORDER — DEXAMETHASONE SODIUM PHOSPHATE 10 MG/ML IJ SOLN
INTRAMUSCULAR | Status: AC
  Filled 2020-10-31: qty 1

## 2020-10-31 MED ORDER — HYDRALAZINE HCL 20 MG/ML IJ SOLN
10.0000 mg | Freq: Four times a day (QID) | INTRAMUSCULAR | Status: DC | PRN
  Administered 2020-10-31 – 2020-11-02 (×6): 10 mg via INTRAVENOUS
  Filled 2020-10-31 (×5): qty 1

## 2020-10-31 MED ORDER — KETAMINE HCL 10 MG/ML IJ SOLN
INTRAMUSCULAR | Status: DC | PRN
  Administered 2020-10-31 (×3): 10 mg via INTRAVENOUS
  Administered 2020-10-31: 30 mg via INTRAVENOUS
  Administered 2020-10-31 (×2): 10 mg via INTRAVENOUS

## 2020-10-31 MED ORDER — LORAZEPAM 2 MG/ML IJ SOLN: 0.5000 mg | Freq: Two times a day (BID) | INTRAMUSCULAR | Status: DC | PRN

## 2020-10-31 MED ORDER — BUPIVACAINE HCL (PF) 0.25 % IJ SOLN
INTRAMUSCULAR | Status: AC
  Filled 2020-10-31: qty 30

## 2020-10-31 MED ORDER — ORAL CARE MOUTH RINSE: 15.0000 mL | Freq: Once | OROMUCOSAL | Status: AC

## 2020-10-31 MED ORDER — NALOXONE HCL 0.4 MG/ML IJ SOLN: 0.4000 mg | INTRAMUSCULAR | Status: DC | PRN

## 2020-10-31 MED ORDER — PROPOFOL 10 MG/ML IV BOLUS
INTRAVENOUS | Status: DC | PRN
  Administered 2020-10-31: 100 mg via INTRAVENOUS
  Administered 2020-10-31 (×2): 50 mg via INTRAVENOUS

## 2020-10-31 MED ORDER — BUSPIRONE HCL 5 MG PO TABS
5.0000 mg | ORAL_TABLET | Freq: Every day | ORAL | Status: DC
  Administered 2020-11-01 – 2020-11-06 (×6): 5 mg via ORAL
  Filled 2020-10-31 (×7): qty 1

## 2020-10-31 MED ORDER — DIPHENHYDRAMINE HCL 50 MG/ML IJ SOLN: 12.5000 mg | Freq: Four times a day (QID) | INTRAMUSCULAR | Status: DC | PRN

## 2020-10-31 MED ORDER — ONDANSETRON HCL 4 MG/2ML IJ SOLN: 4.0000 mg | Freq: Once | INTRAMUSCULAR | Status: DC | PRN

## 2020-10-31 MED ORDER — ROCURONIUM BROMIDE 10 MG/ML (PF) SYRINGE
PREFILLED_SYRINGE | INTRAVENOUS | Status: DC | PRN
  Administered 2020-10-31: 40 mg via INTRAVENOUS
  Administered 2020-10-31: 10 mg via INTRAVENOUS
  Administered 2020-10-31 (×2): 50 mg via INTRAVENOUS
  Administered 2020-10-31: 20 mg via INTRAVENOUS
  Administered 2020-10-31: 100 mg via INTRAVENOUS
  Administered 2020-10-31 (×2): 50 mg via INTRAVENOUS

## 2020-10-31 MED ORDER — FENTANYL CITRATE (PF) 250 MCG/5ML IJ SOLN
INTRAMUSCULAR | Status: DC | PRN
  Administered 2020-10-31 (×2): 50 ug via INTRAVENOUS
  Administered 2020-10-31: 100 ug via INTRAVENOUS
  Administered 2020-10-31 (×4): 50 ug via INTRAVENOUS

## 2020-10-31 MED ORDER — DIPHENHYDRAMINE HCL 12.5 MG/5ML PO ELIX: 12.5000 mg | ORAL_SOLUTION | Freq: Four times a day (QID) | ORAL | Status: DC | PRN

## 2020-10-31 MED ORDER — HYDROMORPHONE 1 MG/ML IV SOLN
INTRAVENOUS | Status: AC
  Filled 2020-10-31: qty 30

## 2020-10-31 SURGICAL SUPPLY — 123 items
BAG BILE T-TUBES STRL (MISCELLANEOUS) IMPLANT
BAG COUNTER SPONGE SURGICOUNT (BAG) ×6 IMPLANT
BAG DRAINAGE 600ML DEPOT (BAG) IMPLANT
BIOPATCH RED 1 DISK 7.0 (GAUZE/BANDAGES/DRESSINGS) ×2 IMPLANT
BLADE CLIPPER SURG (BLADE) ×2 IMPLANT
BOOT SUTURE AID YELLOW STND (SUTURE) ×4 IMPLANT
CANISTER SUCT 3000ML PPV (MISCELLANEOUS) ×2 IMPLANT
CHLORAPREP W/TINT 26 (MISCELLANEOUS) ×2 IMPLANT
CLIP TI LARGE 6 (CLIP) ×2 IMPLANT
CLIP TI MEDIUM 24 (CLIP) ×4 IMPLANT
CLIP TI WIDE RED SMALL 24 (CLIP) ×4 IMPLANT
CLIP VESOCCLUDE LG 6/CT (CLIP) ×2 IMPLANT
CNTNR URN SCR LID CUP LEK RST (MISCELLANEOUS) ×5 IMPLANT
CONT SPEC 4OZ STRL OR WHT (MISCELLANEOUS) ×5
COUNTER NEEDLE 20 DBL MAG RED (NEEDLE) IMPLANT
COVER MAYO STAND STRL (DRAPES) IMPLANT
COVER SURGICAL LIGHT HANDLE (MISCELLANEOUS) ×2 IMPLANT
DERMABOND ADVANCED (GAUZE/BANDAGES/DRESSINGS) ×1
DERMABOND ADVANCED .7 DNX12 (GAUZE/BANDAGES/DRESSINGS) ×1 IMPLANT
DRAIN CHANNEL 19F RND (DRAIN) ×4 IMPLANT
DRAIN PENROSE 0.5X18 (DRAIN) IMPLANT
DRAIN PENROSE 1/2X12 LTX STRL (WOUND CARE) ×2 IMPLANT
DRAPE INCISE IOBAN 66X45 STRL (DRAPES) ×2 IMPLANT
DRAPE LAPAROSCOPIC ABDOMINAL (DRAPES) ×2 IMPLANT
DRAPE WARM FLUID 44X44 (DRAPES) ×2 IMPLANT
DRSG COVADERM PLUS 2X2 (GAUZE/BANDAGES/DRESSINGS) IMPLANT
DRSG TEGADERM 4X4.75 (GAUZE/BANDAGES/DRESSINGS) ×2 IMPLANT
DRSG TELFA 3X8 NADH (GAUZE/BANDAGES/DRESSINGS) IMPLANT
ELECT BLADE 4.0 EZ CLEAN MEGAD (MISCELLANEOUS) ×2
ELECT BLADE 6.5 EXT (BLADE) ×4 IMPLANT
ELECT CAUTERY BLADE 6.4 (BLADE) ×2 IMPLANT
ELECT NEEDLE BLADE 2-5/6 (NEEDLE) ×2 IMPLANT
ELECT PAD DSPR THERM+ ADLT (MISCELLANEOUS) ×2 IMPLANT
ELECT REM PT RETURN 9FT ADLT (ELECTROSURGICAL) ×2
ELECTRODE BLDE 4.0 EZ CLN MEGD (MISCELLANEOUS) ×1 IMPLANT
ELECTRODE REM PT RTRN 9FT ADLT (ELECTROSURGICAL) ×1 IMPLANT
EVACUATOR SILICONE 100CC (DRAIN) ×4 IMPLANT
GAUZE 4X4 16PLY ~~LOC~~+RFID DBL (SPONGE) ×2 IMPLANT
GAUZE SPONGE 4X4 12PLY STRL (GAUZE/BANDAGES/DRESSINGS) ×2 IMPLANT
GEL ULTRASOUND 20GR AQUASONIC (MISCELLANEOUS) IMPLANT
GLOVE SURG ORTHO LTX SZ8 (GLOVE) ×2 IMPLANT
GLOVE SURG POLY MICRO LF SZ5.5 (GLOVE) ×2 IMPLANT
GLOVE SURG PR MICRO ENCORE 7 (GLOVE) ×2 IMPLANT
GLOVE SURG SYN 5.5 (GLOVE) ×4 IMPLANT
GLOVE SURG UNDER POLY LF SZ6 (GLOVE) ×2 IMPLANT
GLOVE SURG UNDER POLY LF SZ7 (GLOVE) ×2 IMPLANT
GOWN STRL REUS W/ TWL LRG LVL3 (GOWN DISPOSABLE) ×5 IMPLANT
GOWN STRL REUS W/TWL LRG LVL3 (GOWN DISPOSABLE) ×5
HAND PENCIL TRP OPTION (MISCELLANEOUS) ×2 IMPLANT
HANDLE SUCTION POOLE (INSTRUMENTS) ×1 IMPLANT
HEMOSTAT SNOW SURGICEL 2X4 (HEMOSTASIS) ×6 IMPLANT
HEMOSTAT SURGICEL 2X14 (HEMOSTASIS) IMPLANT
KIT BASIN OR (CUSTOM PROCEDURE TRAY) ×2 IMPLANT
KIT TUBE JEJUNAL 16FR (CATHETERS) IMPLANT
KIT TURNOVER KIT B (KITS) ×2 IMPLANT
L-HOOK LAP DISP 36CM (ELECTROSURGICAL) ×2
LHOOK LAP DISP 36CM (ELECTROSURGICAL) ×1 IMPLANT
LOOP VESSEL MAXI BLUE (MISCELLANEOUS) ×2 IMPLANT
LOOP VESSEL MINI RED (MISCELLANEOUS) ×2 IMPLANT
MARKER SKIN DUAL TIP RULER LAB (MISCELLANEOUS) ×2 IMPLANT
NEEDLE 22X1 1/2 (OR ONLY) (NEEDLE) ×2 IMPLANT
NEEDLE INSUFFLATION 14GA 120MM (NEEDLE) ×2 IMPLANT
NS IRRIG 1000ML POUR BTL (IV SOLUTION) ×4 IMPLANT
PACK GENERAL/GYN (CUSTOM PROCEDURE TRAY) IMPLANT
PAD ARMBOARD 7.5X6 YLW CONV (MISCELLANEOUS) ×4 IMPLANT
PENCIL SMOKE EVACUATOR (MISCELLANEOUS) ×2 IMPLANT
PROBE LAPAROSCOPIC 5MM W/FTSWT (MISCELLANEOUS) ×2 IMPLANT
RELOAD PROXIMATE 75MM BLUE (ENDOMECHANICALS) ×2 IMPLANT
RELOAD PROXIMATE 75MM GREEN (ENDOMECHANICALS) IMPLANT
RETRACTOR WND ALEXIS 25 LRG (MISCELLANEOUS) IMPLANT
RETRACTOR WOUND ALXS 34CM XLRG (MISCELLANEOUS) ×1 IMPLANT
RTRCTR WOUND ALEXIS 25CM LRG (MISCELLANEOUS)
RTRCTR WOUND ALEXIS 34CM XLRG (MISCELLANEOUS) ×2
SCISSORS LAP 5X35 DISP (ENDOMECHANICALS) ×2 IMPLANT
SET IRRIG TUBING LAPAROSCOPIC (IRRIGATION / IRRIGATOR) ×2 IMPLANT
SET TUBE SMOKE EVAC HIGH FLOW (TUBING) ×2 IMPLANT
SHEARS FOC LG CVD HARMONIC 17C (MISCELLANEOUS) IMPLANT
SHEARS HARMONIC 9CM CVD (BLADE) ×2 IMPLANT
SLEEVE ENDOPATH XCEL 5M (ENDOMECHANICALS) ×2 IMPLANT
SPONGE INTESTINAL PEANUT (DISPOSABLE) IMPLANT
SPONGE SURGIFOAM ABS GEL 100 (HEMOSTASIS) IMPLANT
SPONGE T-LAP 18X18 ~~LOC~~+RFID (SPONGE) ×16 IMPLANT
STAPLER PROXIMATE 75MM BLUE (STAPLE) ×2 IMPLANT
STAPLER VISISTAT 35W (STAPLE) IMPLANT
SUCTION POOLE HANDLE (INSTRUMENTS) ×2
SUT ETHILON 2 0 FS 18 (SUTURE) ×4 IMPLANT
SUT ETHILON 2 LR (SUTURE) IMPLANT
SUT MNCRL AB 4-0 PS2 18 (SUTURE) ×4 IMPLANT
SUT PDS AB 1 TP1 96 (SUTURE) ×4 IMPLANT
SUT PDS AB 3-0 SH 27 (SUTURE) ×4 IMPLANT
SUT PDS AB 4-0 RB1 27 (SUTURE) ×34 IMPLANT
SUT PDS II 5-0 RB-2 VIOLET (SUTURE) ×24 IMPLANT
SUT PROLENE 3 0 SH 48 (SUTURE) ×6 IMPLANT
SUT PROLENE 4 0 RB 1 (SUTURE) ×4
SUT PROLENE 4-0 RB1 .5 CRCL 36 (SUTURE) ×3 IMPLANT
SUT PROLENE 4-0 RB1 18X2 ARM (SUTURE) ×1 IMPLANT
SUT SILK 2 0 TIES 10X30 (SUTURE) ×8 IMPLANT
SUT SILK 2 0SH CR/8 30 (SUTURE) ×2 IMPLANT
SUT SILK 3 0 SH CR/8 (SUTURE) ×2 IMPLANT
SUT SILK 3 0 TIES 10X30 (SUTURE) ×4 IMPLANT
SUT SILK 3 0SH CR/8 30 (SUTURE) ×6 IMPLANT
SUT VIC AB 2-0 CT1 27 (SUTURE) ×2
SUT VIC AB 2-0 CT1 TAPERPNT 27 (SUTURE) ×2 IMPLANT
SUT VIC AB 2-0 SH 18 (SUTURE) IMPLANT
SUT VIC AB 3-0 MH 27 (SUTURE) ×14 IMPLANT
SUT VIC AB 3-0 SH 18 (SUTURE) ×2 IMPLANT
SUT VIC AB 3-0 SH 27 (SUTURE) ×2
SUT VIC AB 3-0 SH 27X BRD (SUTURE) ×2 IMPLANT
SUT VIC AB 3-0 SH 8-18 (SUTURE) ×6 IMPLANT
SUT VICRYL AB 2 0 TIES (SUTURE) IMPLANT
SYR BULB IRRIG 60ML STRL (SYRINGE) ×2 IMPLANT
TAPE UMBILICAL 1/8 X36 TWILL (MISCELLANEOUS) IMPLANT
TOWEL GREEN STERILE (TOWEL DISPOSABLE) ×2 IMPLANT
TOWEL GREEN STERILE FF (TOWEL DISPOSABLE) ×2 IMPLANT
TRAY FOLEY MTR SLVR 14FR STAT (SET/KITS/TRAYS/PACK) ×2 IMPLANT
TRAY LAPAROSCOPIC MC (CUSTOM PROCEDURE TRAY) ×2 IMPLANT
TROCAR XCEL BLUNT TIP 100MML (ENDOMECHANICALS) ×2 IMPLANT
TROCAR XCEL NON-BLD 5MMX100MML (ENDOMECHANICALS) ×2 IMPLANT
TUBE CONNECTING 12X1/4 (SUCTIONS) ×4 IMPLANT
TUBE FEEDING 8FR 16IN STR KANG (MISCELLANEOUS) IMPLANT
TUBE FEEDING ENTERAL 5FR 16IN (TUBING) IMPLANT
WARMER LAPAROSCOPE (MISCELLANEOUS) IMPLANT
YANKAUER SUCT BULB TIP NO VENT (SUCTIONS) ×2 IMPLANT

## 2020-10-31 NOTE — Transfer of Care (Signed)
Immediate Anesthesia Transfer of Care Note  Patient: [name] [name]  Procedure(s) Performed: STAGING LAPAROSCOPY WHIPPLE PROCEDURE, INTRAOPERATIVE ULTRASOUND  Patient Location: PACU  Anesthesia Type:General  Level of Consciousness: drowsy and patient cooperative  Airway & Oxygen Therapy: Patient Spontanous Breathing, Patient connected to face mask oxygen and oral airway  Post-op Assessment: Report given to RN and Post -op Vital signs reviewed and stable  Post vital signs: Reviewed and stable  Last Vitals:  Vitals Value Taken Time  BP 158/91 10/31/20 1635  Temp    Pulse 96 10/31/20 1644  Resp 20 10/31/20 1644  SpO2 98 % 10/31/20 1644  Vitals shown include unvalidated device data.  Last Pain:  Vitals:   10/31/20 0600  TempSrc:   PainSc: 2          Complications: No notable events documented.

## 2020-10-31 NOTE — Op Note (Signed)
Date: 10/31/20  Patient: [name] [name] MRN: [id number]  Preoperative Diagnosis: Cholangiocarcinoma Postoperative Diagnosis: Same  Procedure: Staging laparoscopy, pylorus-preserving pancreaticoduodenectomy (Whipple procedure)  Surgeon: Michaelle Birks, MD Assistant: Armandina Gemma MD  EBL: 600 mL  Anesthesia: General endotracheal  Specimens: Gallbladder Whipple specimen Common hepatic duct margin New common hepatic duct margin Right hepatic duct margin Station 8 lymph node Portal lymph nodes  Indications: [name] [name] is a 65 yo male who presented with obstructive jaundice and was found to have a mass within the proximal common bile duct adjacent to the cystic duct insertion. ERCP with FNA confirmed adenocarcinoma. A plastic biliary stent was placed, after which his bilirubin normalized. After an extensive discussion of the risks and benefits of surgery, he agreed to proceed with surgical resection.  Findings: No evidence of metastatic disease within the abdomen.  Mass in the common bile duct extending from the head of the pancreas up the common hepatic duct. Initial hepatic duct margin was suspicious for malignant cells, but an additional margin was negative. This required resection of the bile duct up to the bifurcation of the left and right hepatic ducts.  Hepatic steatosis and mild nodularity of the liver.  Procedure details: Informed consent was obtained in the preoperative area prior to the procedure. The patient was brought to the operating room and placed on the table in the supine position.  General anesthesia was induced and appropriate lines and drains were placed for intraoperative monitoring. Perioperative antibiotics were administered per SCIP guidelines. The abdomen was prepped and draped in the usual sterile fashion. A pre-procedure timeout was taken verifying patient identity, surgical site and procedure to be performed.  An supraumbilical skin incision was made, and the  subcutaneous tissue was divided with cautery. The umbilical stalk was grasped and elevated.  A Veress needle was inserted and intraperitoneal placement was confirmed with the saline drop test.  The abdomen was insufflated and a 5 mm port was placed. The peritoneal cavity was inspected, including the liver, both hemidiaphragms, and the peritoneal surface throughout the abdomen. There was no evidence of metastatic disease.  The liver was steatotic with very mild nodularity but was not overtly cirrhotic.  The port was removed and the abdomen was desufflated.  An upper midline skin incision was made and the subcutaneous tissue was divided with cautery. The fascia was opened along the linea alba and the peritoneal cavity was entered.  The falciform ligament was ligated with 2-0 silk ties and divided. A Bookwalter fixed retractor was placed. The duodenum was widely kocherized to expose the IVC and aorta. After kocherization, the SMA was palpated, and was soft with no adenopathy. Next the gastrocolic omentum was divided with harmonic shears to open the lesser sac. Next the portal dissection was started.  A firm mass was palpated within the common bile duct at the insertion of the cystic duct.  The gallbladder was taken off the liver in a dome down fashion.  The cystic duct and cystic artery were suture-ligated and divided.  The common bile duct was circumferentially dissected out, sweeping the adjacent lymph nodes down towards the duodenum with the planned specimen.  A replaced right hepatic artery was identified on the right lateral surface of the common bile duct and common hepatic duct, and this was carefully preserved during the dissection.  The common hepatic duct was encircled just proximal to the cystic duct, proximal to the palpable mass.  The common hepatic duct was divided at this point sharply, and the margin  was sent for frozen section.  The previously placed biliary stent was removed.  The frozen analysis  was suspicious for malignancy, so an additional common hepatic duct margin was taken.  This showed reactive cells but on review by two pathologists was felt to be benign.  An additional frozen was sent from the takeoff of the right hepatic duct, and this was negative for malignant cells.  Thus we proceeded with the resection.  The gastroduodenal artery was then circumferentially dissected out, and on occlusion a pulse remained in the proper hepatic artery. The GDA was doubly ligated with 2-0 silk ties and a 4-0 Prolene suture ligature and divided. The gastroepiploic vessels were then ligated with 2-0 silk suture just distal to the pylorus.  The duodenum was transected just distal to the pylorus using a 75 mm GIA stapler with a blue load.  Inferior border of the pancreas was identified in the lesser sac and dissected using cautery.  A plane was then created between the anterior surface of the portal vein and the neck of the pancreas using gentle blunt dissection.  Gastroepiploic vein was circumferentially dissected out at the insertion into the SMV and was then ligated with a 3-0 silk tie and divided.  The neck of the pancreas was partially divided using cautery, and the posterior portion was transected sharply with a scalpel. A mesenteric window was created at a point on the jejunum about 30cm distal to the ligament of Treitz, and the bowel was transected at this point using a 64m GIA blue load stapler. The jejunal mesentery was divided with Harmonic shears, and the ligament of Treitz was taken down with cautery. The proximal jejunum was then brought underneath the mesocolon into the RUQ. The uncinate process of the pancreas was then carefully dissected off the retroperitoneum and SMA, taking great care to preserve the replaced right hepatic artery off the SMA. The uncinate process and adjacent lymphatic tissue were taken off the SMA using harmonic shears. Larger branch vessels were clamped and tied with 2-0 silk  ties. The specimen was removed and oriented, and sent for routine pathology.  At the completion of this dissection there were palpable pulses in the SMA, the replaced right hepatic artery, the proper hepatic artery, and the left hepatic artery.   The abdomen was irrigated and no active bleeding was noted.  There was a large defect in the transverse mesocolon to the left of the middle colic vessels, which was closed with 3-0 silk figure-of-eight sutures.  For reconstruction the distal transected end of the jejunum was brought up through a window in the transverse mesocolon to the right of the middle colic vessels. The end of the jejunum was laid next to the transected neck of the pancreas. A pancreaticojejunostomy was created in Blumgart fashion using 3-0 vicryl transpancreatic sutures to the jejunum, and 5-0 PDS interrupted duct-to-mucosal sutures.  The hepatic ducts were probed to confirm that the left hepatic duct and anterior and posterior branches of the right hepatic duct were patent.  An end-to-side hepaticojejunostomy was then created using interrupted 4-0 PDS. The pancreaticobiliary limb of jejunum was then tacked to the transverse colon mesentery using 3-0 Vicryl suture. An antecolic duodenojejunostomy was then created in 2 layers. A back layer of interrupted 3-0 silk sutures was placed between the stapled end of the duodenum and the jejunum. The duodenal staple line was then removed with cautery and an enterotomy was created on the jejunum. An inner layer of running 3-0 vicryl suture was  placed in [place] fashion. An anterior row of 3-0 silk Lembert sutures was then placed to complete the anastomosis. Confirmation of the nasogastric tube tip within the stomach was confirmed. The defect at the ligament of Treitz was closed with 3-0 silk suture. A 19-Fr round JP drain was placed posterior to the pancreatic and biliary anastomoses and brought out through the right lateral abdominal wall, and an additional  51 [place] round JP drain was placed anterior to the pancreatic and biliary anastomoses and brought out through the right medial abdominal wall.  Both drains were sutured to the skin with 2-0 nylon suture. A falciform ligament flap was mobilized and laid over the GDA stump. The retractors were removed and the fascia was closed with running looped 1 PDS. Scarpa's fascia was closed with running 3-0 vicryl suture, and the skin was closed with a running subcuticular 4-0 monocryl suture. Dermabond was applied.   Patient tolerated the procedure with no apparent complications.  All counts were correct x2 at the end of the procedure. The patient was extubated and taken to PACU in stable condition.  Michaelle Birks, MD 10/31/20 5:15 PM

## 2020-10-31 NOTE — Interval H&P Note (Signed)
History and Physical Interval Note:  10/31/2020 6:58 AM  [name]  has presented today for surgery, with the diagnosis of CHOLANGIOCARCINOMA.  The various methods of treatment have been discussed with the patient and family. After consideration of risks, benefits and other options for treatment, the patient has consented to  Procedure(s) with comments: STAGING LAPAROSCOPY (N/A) - GEN AND TAP BLOCK WHIPPLE PROCEDURE, INTRAOPERATIVE ULTRASOUND (N/A) as a surgical intervention.  The patient's history has been reviewed, patient examined, no change in status, stable for surgery.  I have reviewed the patient's chart and labs.  Questions were answered to the patient's satisfaction.  Proceed to OR, admit to inpatient postoperatively.   Dwan Bolt

## 2020-10-31 NOTE — Progress Notes (Signed)
RT called to bedside to access the patient's arterial line which will not pull back.  RT redressed the line, has a good wave form but will not pull back.  RN at bedside notified.

## 2020-10-31 NOTE — Anesthesia Procedure Notes (Signed)
Anesthesia Regional Block: TAP block   Pre-Anesthetic Checklist: , timeout performed,  Correct Patient, Correct Site, Correct Laterality,  Correct Procedure, Correct Position, site marked,  Risks and benefits discussed,  Surgical consent,  Pre-op evaluation,  At surgeon's request and post-op pain management  Laterality: Left and Right  Prep: chloraprep       Needles:  Injection technique: Single-shot  Needle Type: Echogenic Stimulator Needle     Needle Length: 5cm  Needle Gauge: 22     Additional Needles:   Procedures:, nerve stimulator,,, ultrasound used (permanent image in chart),,    Narrative:  Start time: 10/31/2020 7:00 AM End time: 10/31/2020 7:15 AM Injection made incrementally with aspirations every 5 mL.  Performed by: Personally  Anesthesiologist: Janeece Riggers, MD  Additional Notes: Functioning IV was confirmed and monitors were applied.  A 52m 22ga Arrow echogenic stimulator needle was used. Sterile prep and drape,hand hygiene and sterile gloves were used. Ultrasound guidance: relevant anatomy identified, needle position confirmed, local anesthetic spread visualized around nerve(s)., vascular puncture avoided.  Image printed for medical record. Negative aspiration and negative test dose prior to incremental administration of local anesthetic. The patient tolerated the procedure well.   NO PIK

## 2020-10-31 NOTE — Anesthesia Procedure Notes (Signed)
Arterial Line Insertion Performed by: Janeece Riggers, MD, Leonor Liv, CRNA, CRNA  Preanesthetic checklist: patient identified, IV checked, site marked, risks and benefits discussed, surgical consent, monitors and equipment checked, pre-op evaluation, timeout performed and anesthesia consent Patient sedated Right, radial was placed Catheter size: 20 G Hand hygiene performed  and maximum sterile barriers used   Attempts: 2 (2 attemps left side; bruising, pressure dressing present) Procedure performed without using ultrasound guided technique. Following insertion, dressing applied and Biopatch. Post procedure assessment: normal and unchanged

## 2020-10-31 NOTE — Anesthesia Procedure Notes (Addendum)
Procedure Name: Intubation Date/Time: 10/31/2020 7:42 AM Performed by: Leonor Liv, CRNA Pre-anesthesia Checklist: Patient identified, Emergency Drugs available, Suction available and Patient being monitored Patient Re-evaluated:Patient Re-evaluated prior to induction Oxygen Delivery Method: Circle System Utilized Preoxygenation: Pre-oxygenation with 100% oxygen Induction Type: IV induction Ventilation: Mask ventilation without difficulty Laryngoscope Size: Mac and 4 Grade View: Grade I Tube type: Oral Number of attempts: 1 Airway Equipment and Method: Stylet and Oral airway Placement Confirmation: ETT inserted through vocal cords under direct vision, positive ETCO2 and breath sounds checked- equal and bilateral Secured at: 23 cm Tube secured with: Tape Dental Injury: Teeth and Oropharynx as per pre-operative assessment

## 2020-11-01 ENCOUNTER — Encounter (HOSPITAL_COMMUNITY): Payer: Self-pay | Admitting: Surgery

## 2020-11-01 ENCOUNTER — Other Ambulatory Visit (HOSPITAL_COMMUNITY): Payer: Federal, State, Local not specified - PPO

## 2020-11-01 LAB — COMPREHENSIVE METABOLIC PANEL
ALT: 47 U/L — ABNORMAL HIGH (ref 0–44)
AST: 59 U/L — ABNORMAL HIGH (ref 15–41)
Albumin: 3.1 g/dL — ABNORMAL LOW (ref 3.5–5.0)
Alkaline Phosphatase: 52 U/L (ref 38–126)
Anion gap: 7 (ref 5–15)
BUN: 13 mg/dL (ref 8–23)
CO2: 27 mmol/L (ref 22–32)
Calcium: 8.1 mg/dL — ABNORMAL LOW (ref 8.9–10.3)
Chloride: 105 mmol/L (ref 98–111)
Creatinine, Ser: 0.65 mg/dL (ref 0.61–1.24)
GFR, Estimated: >60 mL/min (ref >60)
Glucose, Bld: 146 mg/dL — ABNORMAL HIGH (ref 70–99)
Potassium: 3.7 mmol/L (ref 3.5–5.1)
Sodium: 139 mmol/L (ref 135–145)
Total Bilirubin: 0.7 mg/dL (ref 0.3–1.2)
Total Protein: 5.5 g/dL — ABNORMAL LOW (ref 6.5–8.1)

## 2020-11-01 LAB — CBC
HCT: 44.1 % (ref 39.0–52.0)
Hemoglobin: 15 g/dL (ref 13.0–17.0)
MCH: 35.4 pg — ABNORMAL HIGH (ref 26.0–34.0)
MCHC: 34 g/dL (ref 30.0–36.0)
MCV: 104 fL — ABNORMAL HIGH (ref 80.0–100.0)
Platelets: 194 10*3/uL (ref 150–400)
RBC: 4.24 MIL/uL (ref 4.22–5.81)
RDW: 13.8 % (ref 11.5–15.5)
WBC: 11.2 10*3/uL — ABNORMAL HIGH (ref 4.0–10.5)
nRBC: 0 % (ref 0.0–0.2)

## 2020-11-01 LAB — GLUCOSE, CAPILLARY
Glucose-Capillary: 142 mg/dL — ABNORMAL HIGH (ref 70–99)
Glucose-Capillary: 140 mg/dL — ABNORMAL HIGH (ref 70–99)
Glucose-Capillary: 151 mg/dL — ABNORMAL HIGH (ref 70–99)
Glucose-Capillary: 125 mg/dL — ABNORMAL HIGH (ref 70–99)
Glucose-Capillary: 131 mg/dL — ABNORMAL HIGH (ref 70–99)

## 2020-11-01 MED ORDER — DEXTROSE-NACL 5-0.45 % IV SOLN: INTRAVENOUS | Status: DC

## 2020-11-01 NOTE — Anesthesia Postprocedure Evaluation (Signed)
Anesthesia Post Note  Patient: [name] [name]  Procedure(s) Performed: STAGING LAPAROSCOPY WHIPPLE PROCEDURE, INTRAOPERATIVE ULTRASOUND     Patient location during evaluation: PACU Anesthesia Type: General Level of consciousness: awake and alert and oriented Pain management: pain level controlled Vital Signs Assessment: post-procedure vital signs reviewed and stable Respiratory status: spontaneous breathing, nonlabored ventilation, respiratory function stable and patient connected to nasal cannula oxygen Cardiovascular status: blood pressure returned to baseline and stable Postop Assessment: no apparent nausea or vomiting Anesthetic complications: no   No notable events documented.  Last Vitals:  Vitals:   11/01/20 0800 11/01/20 0811  BP: (!) 146/85   Pulse: (!) 107   Resp: 16 13  Temp: 36.9 C   SpO2: 94% 93%    Last Pain:  Vitals:   11/01/20 0820  TempSrc:   PainSc: 3                  [name] Hewes A.

## 2020-11-01 NOTE — Progress Notes (Signed)
1 Day Post-Op  Subjective: Afebrile, hemodynamically stable. Denies nausea/vomiting. Labs unremarkable.   Objective: Vital signs in last 24 hours: Temp:  [97 F (36.1 C)-98.7 F (37.1 C)] 98.7 F (37.1 C) (09/13 0300) Pulse Rate:  [86-109] 107 (09/13 0700) Resp:  [0-27] 13 (09/13 0811) BP: (125-179)/(69-104) 154/83 (09/13 0700) SpO2:  [89 %-100 %] 93 % (09/13 0811) Arterial Line BP: (118-184)/(65-131) 154/124 (09/13 0330) FiO2 (%):  [28 %] 28 % (09/13 0401)    Intake/Output from previous day: 09/12 0701 - 09/13 0700 In: 5256.6 [I.V.:4356.6; IV Piggyback:850] Out: 4160 [Urine:2200; Drains:1360; Blood:600] Intake/Output this shift: No intake/output data recorded.  PE: General: resting comfortably, NAD Neuro: alert and oriented, no focal deficits HEENT: NG in place with minimal gastric contents Resp: normal work of breathing CV: RRR Abdomen: soft, nondistended, nontender to palpation. Midline incision clean and dry with no erythema, induration or drainage. Posterior JP with bilious drainage, anterior JP with serosanguinous drainage. Extremities: warm and well-perfused GU: foley in place draining clear yellow urine  Lab Results:  Recent Labs    10/31/20 1710 11/01/20 0354  WBC 13.1* 11.2*  HGB 14.5 15.0  HCT 43.8 44.1  PLT 223 194   BMET Recent Labs    10/31/20 1710 11/01/20 0354  NA 138 139  K 4.0 3.7  CL 105 105  CO2 24 27  GLUCOSE 179* 146*  BUN 16 13  CREATININE 0.58* 0.65  CALCIUM 8.0* 8.1*   PT/INR No results for input(s): LABPROT, INR in the last 72 hours. CMP     Component Value Date/Time   NA 139 11/01/2020 0354   NA 140 03/28/2020 0000   K 3.7 11/01/2020 0354   CL 105 11/01/2020 0354   CO2 27 11/01/2020 0354   GLUCOSE 146 (H) 11/01/2020 0354   BUN 13 11/01/2020 0354   BUN 9 03/28/2020 0000   CREATININE 0.65 11/01/2020 0354   CREATININE 0.73 10/30/2013 1606   CALCIUM 8.1 (L) 11/01/2020 0354   PROT 5.5 (L) 11/01/2020 0354    ALBUMIN 3.1 (L) 11/01/2020 0354   AST 59 (H) 11/01/2020 0354   ALT 47 (H) 11/01/2020 0354   ALKPHOS 52 11/01/2020 0354   BILITOT 0.7 11/01/2020 0354   GFRNONAA >60 11/01/2020 0354   GFRNONAA >89 10/30/2013 1606   GFRAA 108 03/28/2020 0000   GFRAA >89 10/30/2013 1606   Lipase     Component Value Date/Time   LIPASE 19 09/22/2020 1733       Studies/Results: No results found.  Anti-infectives: Anti-infectives (From admission, onward)    Start     Dose/Rate Route Frequency Ordered Stop   10/31/20 0600  cefTRIAXone (ROCEPHIN) 2 g in sodium chloride 0.9 % 100 mL IVPB       See Hyperspace for full Linked Orders Report.   2 g 200 mL/hr over 30 Minutes Intravenous On call to O.R. 10/31/20 0549 10/31/20 1713   10/31/20 0600  metroNIDAZOLE (FLAGYL) IVPB 500 mg       See Hyperspace for full Linked Orders Report.   500 mg 100 mL/hr over 60 Minutes Intravenous On call to O.R. 10/31/20 [id number] 10/31/20 0739        Assessment/Plan 65 yo male with cholangiocarcinoma, POD1 s/p Whipple. - Bile leak: currently well-controlled with surgical drain, no fevers, WBC. Monitor output. Begin antibiotics if patient has fevers, leukocytosis or worsening tachycardia. - Remove NG, allow sips of clear liquids - Remove foley and a-line - Maintenance IVF at 75 ml/hr - COPD and  OSA: home CPAP at night, scheduled duonebs, aggressive pulmonary toilet - Mobilize today, PT ordered - VTE: lovenox, SCDs - Dispo: ICU    LOS: 1 day    Michaelle Birks, MD [facility] Surgery General, Hepatobiliary and Pancreatic Surgery 11/01/20 8:20 AM

## 2020-11-01 NOTE — Evaluation (Signed)
Physical Therapy Evaluation Patient Details Name: [name] [name] MRN: [id number] DOB: 01/04/1956 Today's Date: 11/01/2020  History of Present Illness  Pt is a 65 y.o. 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 PMH includes COPD, DM, HTN, OSA, PTSD, bipolar disorder, depression, prior ETOH use.   Clinical Impression  Pt presents with an overall decrease in functional mobility secondary to above. PTA, pt independent, active, lives with supportive family. Today, pt able to initiate transfer and gait training without DME, intermittent min guard for balance. Educ on abdominal precautions for comfort, positioning, activity recommendations and importance of mobility. Pt would benefit from continued acute PT services to maximize functional mobility and independence prior to d/c home.      SpO2 >/92% on RA   Recommendations for follow up therapy are one component of a multi-disciplinary discharge planning process, led by the attending physician.  Recommendations may be updated based on patient status, additional functional criteria and insurance authorization.  Follow Up Recommendations No PT follow up;Supervision for mobility/OOB    Equipment Recommendations  None recommended by PT    Recommendations for Other Services       Precautions / Restrictions Precautions Precautions: Fall;Other (comment) Precaution Comments: PCA, 2x JP drains; abdominal for comfort Restrictions Weight Bearing Restrictions: No      Mobility  Bed Mobility               General bed mobility comments: Received sitting in recliner; verbal education on log roll technique for bed mobility    Transfers Overall transfer level: Needs assistance Equipment used: None Transfers: Sit to/from Stand Sit to Stand: Supervision         General transfer comment: Supervision for safety/lines  Ambulation/Gait Ambulation/Gait assistance: Min guard Gait Distance (Feet): 180 Feet Assistive  device: None Gait Pattern/deviations: Step-through pattern;Decreased stride length Gait velocity: Decreased   General Gait Details: Slow, mostly steady gait without DME, intermittent min guard for balance; 1x standing rest break secondary to fatigue and instability; further distance limited by pain and fatigue  Stairs            Wheelchair Mobility    Modified Rankin (Stroke Patients Only)       Balance Overall balance assessment: Needs assistance   Sitting balance-Leahy Scale: Good       Standing balance-Leahy Scale: Fair                               Pertinent Vitals/Pain Pain Assessment: 0-10 Pain Score: 8  Pain Location: Abdomen Pain Descriptors / Indicators: Discomfort;Grimacing;Guarding Pain Intervention(s): Premedicated before session;Other (comment) (educ on abdominal precautions for comfort)    Home Living Family/patient expects to be discharged to:: Private residence Living Arrangements: Spouse/significant other;Children Available Help at Discharge: Family;Available 24 hours/day Type of Home: House Home Access: Stairs to enter Entrance Stairs-Rails: Right Entrance Stairs-Number of Steps: 2 front (no rail), 8 back (rail) Home Layout: One level Home Equipment: Shower seat;Wheelchair - manual      Prior Function Level of Independence: Independent         Comments: Independent without DME, reports he enjoys being active "tinkering" and reparing things     Hand Dominance        Extremity/Trunk Assessment   Upper Extremity Assessment Upper Extremity Assessment: Overall WFL for tasks assessed    Lower Extremity Assessment Lower Extremity Assessment: Overall WFL for tasks assessed    Cervical / Trunk Assessment Cervical / Trunk  Assessment: Other exceptions Cervical / Trunk Exceptions: s/p whipple with large abdominal incision  Communication   Communication: No difficulties  Cognition Arousal/Alertness: Awake/alert Behavior  During Therapy: WFL for tasks assessed/performed Overall Cognitive Status: Within Functional Limits for tasks assessed                                        General Comments General comments (skin integrity, edema, etc.): SpO2 >/92% on RA    Exercises     Assessment/Plan    PT Assessment Patient needs continued PT services  PT Problem List Decreased activity tolerance;Decreased balance;Decreased mobility;Decreased knowledge of precautions;Pain       PT Treatment Interventions DME instruction;Gait training;Stair training;Functional mobility training;Therapeutic activities;Therapeutic exercise;Balance training;Patient/family education    PT Goals (Current goals can be found in the Care Plan section)  Acute Rehab PT Goals Patient Stated Goal: Decreased pain, return home PT Goal Formulation: With patient Time For Goal Achievement: 11/15/20 Potential to Achieve Goals: Good    Frequency Min 3X/week   Barriers to discharge        Co-evaluation               AM-PAC PT "6 Clicks" Mobility  Outcome Measure Help needed turning from your back to your side while in a flat bed without using bedrails?: A Little Help needed moving from lying on your back to sitting on the side of a flat bed without using bedrails?: A Little Help needed moving to and from a bed to a chair (including a wheelchair)?: A Little Help needed standing up from a chair using your arms (e.g., wheelchair or bedside chair)?: A Little Help needed to walk in hospital room?: A Little Help needed climbing 3-5 steps with a railing? : A Little 6 Click Score: 18    End of Session Equipment Utilized During Treatment: Gait belt Activity Tolerance: Patient tolerated treatment well Patient left: in chair;with call bell/phone within reach Nurse Communication: Mobility status PT Visit Diagnosis: Other abnormalities of gait and mobility (R26.89);Pain    Time: [id number] PT Time Calculation (min)  (ACUTE ONLY): 21 min   Charges:   PT Evaluation $PT Eval Low Complexity: [place], PT, DPT Acute Rehabilitation Services  Pager [phone] Office [place] 11/01/2020, 2:46 PM

## 2020-11-01 NOTE — Plan of Care (Signed)

## 2020-11-02 ENCOUNTER — Ambulatory Visit: Payer: Federal, State, Local not specified - PPO | Admitting: Internal Medicine

## 2020-11-02 LAB — POCT I-STAT, CHEM 8
BUN: 26 mg/dL — ABNORMAL HIGH (ref 8–23)
BUN: 20 mg/dL (ref 8–23)
Calcium, Ion: 1.11 mmol/L — ABNORMAL LOW (ref 1.15–1.40)
Calcium, Ion: 1.12 mmol/L — ABNORMAL LOW (ref 1.15–1.40)
Chloride: 103 mmol/L (ref 98–111)
Chloride: 104 mmol/L (ref 98–111)
Creatinine, Ser: 0.5 mg/dL — ABNORMAL LOW (ref 0.61–1.24)
Creatinine, Ser: 0.5 mg/dL — ABNORMAL LOW (ref 0.61–1.24)
Glucose, Bld: 167 mg/dL — ABNORMAL HIGH (ref 70–99)
Glucose, Bld: 196 mg/dL — ABNORMAL HIGH (ref 70–99)
HCT: 41 % (ref 39.0–52.0)
HCT: 41 % (ref 39.0–52.0)
Hemoglobin: 13.9 g/dL (ref 13.0–17.0)
Hemoglobin: 13.9 g/dL (ref 13.0–17.0)
Potassium: 5.3 mmol/L — ABNORMAL HIGH (ref 3.5–5.1)
Potassium: 4.2 mmol/L (ref 3.5–5.1)
Sodium: 138 mmol/L (ref 135–145)
Sodium: 139 mmol/L (ref 135–145)
TCO2: 31 mmol/L (ref 22–32)
TCO2: 29 mmol/L (ref 22–32)

## 2020-11-02 LAB — GLUCOSE, CAPILLARY
Glucose-Capillary: 105 mg/dL — ABNORMAL HIGH (ref 70–99)
Glucose-Capillary: 115 mg/dL — ABNORMAL HIGH (ref 70–99)
Glucose-Capillary: 121 mg/dL — ABNORMAL HIGH (ref 70–99)
Glucose-Capillary: 124 mg/dL — ABNORMAL HIGH (ref 70–99)
Glucose-Capillary: 137 mg/dL — ABNORMAL HIGH (ref 70–99)
Glucose-Capillary: 142 mg/dL — ABNORMAL HIGH (ref 70–99)
Glucose-Capillary: 147 mg/dL — ABNORMAL HIGH (ref 70–99)

## 2020-11-02 LAB — POCT I-STAT 7, (LYTES, BLD GAS, ICA,H+H)
Acid-Base Excess: 2 mmol/L (ref 0.0–2.0)
Acid-Base Excess: 2 mmol/L (ref 0.0–2.0)
Acid-base deficit: 3 mmol/L — ABNORMAL HIGH (ref 0.0–2.0)
Bicarbonate: 30.4 mmol/L — ABNORMAL HIGH (ref 20.0–28.0)
Bicarbonate: 30.7 mmol/L — ABNORMAL HIGH (ref 20.0–28.0)
Bicarbonate: 26.3 mmol/L (ref 20.0–28.0)
Calcium, Ion: 1.12 mmol/L — ABNORMAL LOW (ref 1.15–1.40)
Calcium, Ion: 1.11 mmol/L — ABNORMAL LOW (ref 1.15–1.40)
Calcium, Ion: 1.11 mmol/L — ABNORMAL LOW (ref 1.15–1.40)
HCT: 41 % (ref 39.0–52.0)
HCT: 45 % (ref 39.0–52.0)
HCT: 42 % (ref 39.0–52.0)
Hemoglobin: 13.9 g/dL (ref 13.0–17.0)
Hemoglobin: 15.3 g/dL (ref 13.0–17.0)
Hemoglobin: 14.3 g/dL (ref 13.0–17.0)
O2 Saturation: 97 %
O2 Saturation: 99 %
O2 Saturation: 99 %
Patient temperature: 37.2
Potassium: 5.3 mmol/L — ABNORMAL HIGH (ref 3.5–5.1)
Potassium: 5 mmol/L (ref 3.5–5.1)
Potassium: 4.2 mmol/L (ref 3.5–5.1)
Sodium: 137 mmol/L (ref 135–145)
Sodium: 137 mmol/L (ref 135–145)
Sodium: 140 mmol/L (ref 135–145)
TCO2: 32 mmol/L (ref 22–32)
TCO2: 33 mmol/L — ABNORMAL HIGH (ref 22–32)
TCO2: 28 mmol/L (ref 22–32)
pCO2 arterial: 63.3 mmHg — ABNORMAL HIGH (ref 32.0–48.0)
pCO2 arterial: 67.3 mmHg (ref 32.0–48.0)
pCO2 arterial: 67.7 mmHg (ref 32.0–48.0)
pH, Arterial: 7.289 — ABNORMAL LOW (ref 7.350–7.450)
pH, Arterial: 7.267 — ABNORMAL LOW (ref 7.350–7.450)
pH, Arterial: 7.199 — CL (ref 7.350–7.450)
pO2, Arterial: 110 mmHg — ABNORMAL HIGH (ref 83.0–108.0)
pO2, Arterial: 154 mmHg — ABNORMAL HIGH (ref 83.0–108.0)
pO2, Arterial: 153 mmHg — ABNORMAL HIGH (ref 83.0–108.0)

## 2020-11-02 LAB — COMPREHENSIVE METABOLIC PANEL
ALT: 41 U/L (ref 0–44)
AST: 39 U/L (ref 15–41)
Albumin: 2.4 g/dL — ABNORMAL LOW (ref 3.5–5.0)
Alkaline Phosphatase: 41 U/L (ref 38–126)
Anion gap: 7 (ref 5–15)
BUN: 10 mg/dL (ref 8–23)
CO2: 25 mmol/L (ref 22–32)
Calcium: 7.6 mg/dL — ABNORMAL LOW (ref 8.9–10.3)
Chloride: 101 mmol/L (ref 98–111)
Creatinine, Ser: 0.58 mg/dL — ABNORMAL LOW (ref 0.61–1.24)
GFR, Estimated: >60 mL/min (ref >60)
Glucose, Bld: 435 mg/dL — ABNORMAL HIGH (ref 70–99)
Potassium: 3.1 mmol/L — ABNORMAL LOW (ref 3.5–5.1)
Sodium: 133 mmol/L — ABNORMAL LOW (ref 135–145)
Total Bilirubin: 0.8 mg/dL (ref 0.3–1.2)
Total Protein: 4.9 g/dL — ABNORMAL LOW (ref 6.5–8.1)

## 2020-11-02 LAB — CBC
HCT: 37.4 % — ABNORMAL LOW (ref 39.0–52.0)
Hemoglobin: 12.2 g/dL — ABNORMAL LOW (ref 13.0–17.0)
MCH: 34.8 pg — ABNORMAL HIGH (ref 26.0–34.0)
MCHC: 32.6 g/dL (ref 30.0–36.0)
MCV: 106.6 fL — ABNORMAL HIGH (ref 80.0–100.0)
Platelets: 146 10*3/uL — ABNORMAL LOW (ref 150–400)
RBC: 3.51 MIL/uL — ABNORMAL LOW (ref 4.22–5.81)
RDW: 13.4 % (ref 11.5–15.5)
WBC: 10.4 10*3/uL (ref 4.0–10.5)
nRBC: 0 % (ref 0.0–0.2)

## 2020-11-02 LAB — SURGICAL PATHOLOGY

## 2020-11-02 LAB — MAGNESIUM: Magnesium: 1.7 mg/dL (ref 1.7–2.4)

## 2020-11-02 MED ORDER — CLONAZEPAM 0.5 MG PO TABS
0.5000 mg | ORAL_TABLET | Freq: Two times a day (BID) | ORAL | Status: DC | PRN
  Administered 2020-11-02: 0.5 mg via ORAL
  Filled 2020-11-02: qty 1

## 2020-11-02 MED ORDER — TIAGABINE HCL 4 MG PO TABS
8.0000 mg | ORAL_TABLET | Freq: Every day | ORAL | Status: DC
  Administered 2020-11-02 – 2020-11-06 (×5): 8 mg via ORAL
  Filled 2020-11-02 (×6): qty 2

## 2020-11-02 MED ORDER — CLONIDINE HCL 0.1 MG PO TABS: 0.1000 mg | ORAL_TABLET | Freq: Two times a day (BID) | ORAL | Status: DC

## 2020-11-02 MED ORDER — POTASSIUM CHLORIDE 10 MEQ/100ML IV SOLN
10.0000 meq | INTRAVENOUS | Status: DC
Start: 2020-11-02 — End: 2020-11-02

## 2020-11-02 MED ORDER — CLONIDINE HCL 0.1 MG PO TABS
0.1000 mg | ORAL_TABLET | Freq: Two times a day (BID) | ORAL | Status: DC
  Administered 2020-11-02 – 2020-11-07 (×11): 0.1 mg via ORAL
  Filled 2020-11-02 (×11): qty 1

## 2020-11-02 MED ORDER — POTASSIUM CHLORIDE CRYS ER 20 MEQ PO TBCR
20.0000 meq | EXTENDED_RELEASE_TABLET | Freq: Two times a day (BID) | ORAL | Status: DC
  Administered 2020-11-02: 20 meq via ORAL
  Filled 2020-11-02: qty 1

## 2020-11-02 MED ORDER — ACETAMINOPHEN 325 MG PO TABS
650.0000 mg | ORAL_TABLET | Freq: Four times a day (QID) | ORAL | Status: DC
  Administered 2020-11-02 – 2020-11-06 (×18): 650 mg via ORAL
  Filled 2020-11-02 (×17): qty 2

## 2020-11-02 MED ORDER — POTASSIUM CHLORIDE 10 MEQ/100ML IV SOLN
10.0000 meq | INTRAVENOUS | Status: DC
Start: 2020-11-02 — End: 2020-11-02
  Administered 2020-11-02: 10 meq via INTRAVENOUS
  Filled 2020-11-02: qty 100

## 2020-11-02 MED ORDER — KCL IN DEXTROSE-NACL 40-5-0.45 MEQ/L-%-% IV SOLN
INTRAVENOUS | Status: DC
  Filled 2020-11-02 (×3): qty 1000

## 2020-11-02 MED ORDER — HYDROMORPHONE HCL 1 MG/ML IJ SOLN
0.5000 mg | INTRAMUSCULAR | Status: DC | PRN
  Administered 2020-11-02 – 2020-11-04 (×5): 0.5 mg via INTRAVENOUS
  Filled 2020-11-02 (×5): qty 0.5

## 2020-11-02 MED ORDER — OXYCODONE HCL 5 MG PO TABS
5.0000 mg | ORAL_TABLET | ORAL | Status: DC | PRN
  Administered 2020-11-02 – 2020-11-03 (×3): 5 mg via ORAL
  Filled 2020-11-02 (×3): qty 1

## 2020-11-02 MED ORDER — LABETALOL HCL 5 MG/ML IV SOLN
10.0000 mg | Freq: Once | INTRAVENOUS | Status: AC
  Administered 2020-11-02: 10 mg via INTRAVENOUS
  Filled 2020-11-02: qty 4

## 2020-11-02 NOTE — Progress Notes (Signed)
Physical Therapy Treatment Patient Details Name: [name] [name] MRN: [id number] DOB: Nov 15, 1955 Today's Date: 11/02/2020   History of Present Illness Pt is a 65 y.o. 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 PMH includes COPD, DM, HTN, OSA, PTSD, bipolar disorder, depression, prior ETOH use.   PT Comments    Pt slowly progressing with mobility, remains limited by c/o pain and fatigue, requiring max encouragement to participate. Today's session focused on ambulation for improving strength and activity tolerance. Despite pain, pt moving well. Will continue to follow acutely to address established goals.    Recommendations for follow up therapy are one component of a multi-disciplinary discharge planning process, led by the attending physician.  Recommendations may be updated based on patient status, additional functional criteria and insurance authorization.  Follow Up Recommendations  No PT follow up;Supervision for mobility/OOB     Equipment Recommendations  None recommended by PT    Recommendations for Other Services       Precautions / Restrictions Precautions Precautions: Fall;Other (comment) Precaution Comments: 2x JP drains; abdominal for comfort Restrictions Weight Bearing Restrictions: No     Mobility  Bed Mobility               General bed mobility comments: Received sitting in recliner    Transfers Overall transfer level: Independent Equipment used: None Transfers: Sit to/from [employer] transfer comment: able to stand from recliner and low toilet height indep  Ambulation/Gait Ambulation/Gait assistance: Supervision;Min assist Gait Distance (Feet): 200 Feet Assistive device: None;IV Pole Gait Pattern/deviations: Step-through pattern;Decreased stride length Gait velocity: Decreased   General Gait Details: Slow, mostly steady gait with and without pushing IV pole, supervision for safety/lines initially; 1x LOB  requiring minA to correct, pt reports limited by pain and fatigue, declines further distance   Stairs             Wheelchair Mobility    Modified Rankin (Stroke Patients Only)       Balance Overall balance assessment: Needs assistance   Sitting balance-Leahy Scale: Good     Standing balance support: No upper extremity supported;During functional activity Standing balance-Leahy Scale: Fair                              Cognition Arousal/Alertness: Awake/alert Behavior During Therapy: WFL for tasks assessed/performed Overall Cognitive Status: Within Functional Limits for tasks assessed                                 General Comments: WFL for simple tasks, but distracted by pain/discomfort      Exercises      General Comments General comments (skin integrity, edema, etc.): Requires max encouragement for mobility progressions; reports he has not walked yet today; educ on activity recommendations, importance of mobility      Pertinent Vitals/Pain Pain Assessment: Faces Faces Pain Scale: Hurts even more Pain Location: Abdomen Pain Descriptors / Indicators: Discomfort;Grimacing;Guarding Pain Intervention(s): Monitored during session;Limited activity within patient's tolerance    Home Living                      Prior Function            PT Goals (current goals can now be found in the care plan section) Progress towards PT goals: Progressing toward goals  Frequency    Min 3X/week      PT Plan Current plan remains appropriate    Co-evaluation              AM-PAC PT "6 Clicks" Mobility   Outcome Measure  Help needed turning from your back to your side while in a flat bed without using bedrails?: None Help needed moving from lying on your back to sitting on the side of a flat bed without using bedrails?: None Help needed moving to and from a bed to a chair (including a wheelchair)?: A Little Help needed  standing up from a chair using your arms (e.g., wheelchair or bedside chair)?: A Little Help needed to walk in hospital room?: A Little Help needed climbing 3-5 steps with a railing? : A Little 6 Click Score: 20    End of Session   Activity Tolerance: Patient limited by pain Patient left: in chair;with call bell/phone within reach Nurse Communication: Mobility status (pt requesting JP drains emptied) PT Visit Diagnosis: Other abnormalities of gait and mobility (R26.89);Pain     Time: 1543-1600 PT Time Calculation (min) (ACUTE ONLY): 17 min  Charges:  $Therapeutic Exercise: 8-22 mins                     Mabeline Caras, PT, DPT Acute Rehabilitation Services  Pager [phone] Office [place] 11/02/2020, 5:06 PM

## 2020-11-02 NOTE — Progress Notes (Addendum)
    2 Days Post-Op  Subjective: Remains afebrile, hemodynamically stable. Voiding spontaneously. Ambulated yesterday with PT. On room air. WBC and LFTs unremarkable. Both drains are nonbilious this morning. Denies nausea/vomiting. Pain well-controlled, minimal use of PCA.   Objective: Vital signs in last 24 hours: Temp:  [98.5 F (36.9 C)-99 F (37.2 C)] 98.7 F (37.1 C) (09/14 0745) Pulse Rate:  [87-108] 89 (09/14 0600) Resp:  [10-22] 13 (09/14 0600) BP: (139-169)/(74-129) 162/89 (09/14 0600) SpO2:  [92 %-99 %] 96 % (09/14 0600) FiO2 (%):  [0 %-28 %] 0 % (09/13 2013)    Intake/Output from previous day: 09/13 0701 - 09/14 0700 In: 376.7 [I.V.:376.7] Out: 1480 [Urine:800; Drains:680] Intake/Output this shift: Total I/O In: -  Out: 650 [Urine:650]  PE: General: resting comfortably, NAD Neuro: alert and oriented, no focal deficits Resp: normal work of breathing CV: RRR Abdomen: Mildly distended but soft and nontender to palpation. Midline incision clean and dry with no erythema, induration or drainage. JP x2 with serosanguinous drainage. Extremities: warm and well-perfused   Lab Results:  Recent Labs    11/01/20 0354 11/02/20 0120  WBC 11.2* 10.4  HGB 15.0 12.2*  HCT 44.1 37.4*  PLT 194 146*   BMET Recent Labs    11/01/20 0354 11/02/20 0120  NA 139 133*  K 3.7 3.1*  CL 105 101  CO2 27 25  GLUCOSE 146* 435*  BUN 13 10  CREATININE 0.65 0.58*  CALCIUM 8.1* 7.6*   PT/INR No results for input(s): LABPROT, INR in the last 72 hours. CMP     Component Value Date/Time   NA 133 (L) 11/02/2020 0120   NA 140 03/28/2020 0000   K 3.1 (L) 11/02/2020 0120   CL 101 11/02/2020 0120   CO2 25 11/02/2020 0120   GLUCOSE 435 (H) 11/02/2020 0120   BUN 10 11/02/2020 0120   BUN 9 03/28/2020 0000   CREATININE 0.58 (L) 11/02/2020 0120   CREATININE 0.73 10/30/2013 1606   CALCIUM 7.6 (L) 11/02/2020 0120   PROT 4.9 (L) 11/02/2020 0120   ALBUMIN 2.4 (L) 11/02/2020 0120    AST 39 11/02/2020 0120   ALT 41 11/02/2020 0120   ALKPHOS 41 11/02/2020 0120   BILITOT 0.8 11/02/2020 0120   GFRNONAA >60 11/02/2020 0120   GFRNONAA >89 10/30/2013 1606   GFRAA 108 03/28/2020 0000   GFRAA >89 10/30/2013 1606   Lipase     Component Value Date/Time   LIPASE 19 09/22/2020 1733        Assessment/Plan 65 yo male with cholangiocarcinoma, POD2 s/p Whipple. - Bile leak: Appears to have resolved, drains non-bilious today. Continue to monitor output closely, empty drains q4h. - Advance to clear liquid diet, decrease IVF to 42 ml/hr - COPD and OSA: home CPAP at night, scheduled duonebs, aggressive pulmonary toilet - Pain control: discontinue PCA, transition to prn oxycodone and dilaudid, scheduled tylenol - Hypokalemia: replete potassium - Home meds as appropriate, resume  - Mobilize, PT following - VTE: lovenox, SCDs - Dispo: transfer to progressive care   LOS: 2 days    Michaelle Birks, MD [facility] Surgery General, Hepatobiliary and Pancreatic Surgery 11/02/20 9:28 AM

## 2020-11-02 NOTE — Progress Notes (Signed)
Patient not able to tolerate peripheral IV potassium. Complains of extreme burning sensation at site of PIV. RN assessed, and added a NS KVO to help dilute the IV potassium. Patient continued to have complaints of burning sensation. General surgery PA Will Creig Hines contacted & notified. See new orders.   Luberta Mutter RN

## 2020-11-03 ENCOUNTER — Telehealth: Payer: Self-pay | Admitting: Hematology

## 2020-11-03 LAB — GLUCOSE, CAPILLARY
Glucose-Capillary: 118 mg/dL — ABNORMAL HIGH (ref 70–99)
Glucose-Capillary: 113 mg/dL — ABNORMAL HIGH (ref 70–99)
Glucose-Capillary: 131 mg/dL — ABNORMAL HIGH (ref 70–99)

## 2020-11-03 LAB — COMPREHENSIVE METABOLIC PANEL
ALT: 37 U/L (ref 0–44)
AST: 32 U/L (ref 15–41)
Albumin: 2.6 g/dL — ABNORMAL LOW (ref 3.5–5.0)
Alkaline Phosphatase: 56 U/L (ref 38–126)
Anion gap: 9 (ref 5–15)
BUN: 8 mg/dL (ref 8–23)
CO2: 26 mmol/L (ref 22–32)
Calcium: 8.6 mg/dL — ABNORMAL LOW (ref 8.9–10.3)
Chloride: 105 mmol/L (ref 98–111)
Creatinine, Ser: 0.49 mg/dL — ABNORMAL LOW (ref 0.61–1.24)
GFR, Estimated: >60 mL/min (ref >60)
Glucose, Bld: 134 mg/dL — ABNORMAL HIGH (ref 70–99)
Potassium: 3.6 mmol/L (ref 3.5–5.1)
Sodium: 140 mmol/L (ref 135–145)
Total Bilirubin: 0.8 mg/dL (ref 0.3–1.2)
Total Protein: 5.4 g/dL — ABNORMAL LOW (ref 6.5–8.1)

## 2020-11-03 LAB — CBC
HCT: 37.7 % — ABNORMAL LOW (ref 39.0–52.0)
Hemoglobin: 12.8 g/dL — ABNORMAL LOW (ref 13.0–17.0)
MCH: 35.2 pg — ABNORMAL HIGH (ref 26.0–34.0)
MCHC: 34 g/dL (ref 30.0–36.0)
MCV: 103.6 fL — ABNORMAL HIGH (ref 80.0–100.0)
Platelets: 150 10*3/uL (ref 150–400)
RBC: 3.64 MIL/uL — ABNORMAL LOW (ref 4.22–5.81)
RDW: 12.8 % (ref 11.5–15.5)
WBC: 10.5 10*3/uL (ref 4.0–10.5)
nRBC: 0 % (ref 0.0–0.2)

## 2020-11-03 NOTE — Progress Notes (Signed)
Pt refused cpap for the night.  

## 2020-11-03 NOTE — Telephone Encounter (Signed)
Scheduled per sch msg. Will get printout upon discharge from hospital

## 2020-11-03 NOTE — Progress Notes (Signed)
    3 Days Post-Op  Subjective: Remains afebrile, hemodynamically stable. Had difficulty with pain control yesterday, as well as nausea and an episode of vomiting last night. Reports he feels much better today. Passing flatus. Awaiting progressive care bed.   Objective: Vital signs in last 24 hours: Temp:  [98.4 F (36.9 C)-98.8 F (37.1 C)] 98.7 F (37.1 C) (09/15 0801) Pulse Rate:  [87-113] 91 (09/15 0700) Resp:  [10-21] 21 (09/15 0700) BP: (102-180)/(60-99) 142/88 (09/15 0700) SpO2:  [91 %-100 %] 96 % (09/15 0700) FiO2 (%):  [0 %] 0 % (09/14 0944) Weight:  [80.4 kg] 80.4 kg (09/15 0600)    Intake/Output from previous day: 09/14 0701 - 09/15 0700 In: 766.7 [P.O.:120; I.V.:646.7] Out: 2480 [Urine:1450; Drains:1030] Intake/Output this shift: Total I/O In: -  Out: 161 [Drains:160; Stool:1]  PE: General: resting comfortably, NAD Neuro: alert and oriented, no focal deficits Resp: normal work of breathing on room air CV: RRR Abdomen: soft, distension improved, nontender to palpation. Midline incision clean and dry with no erythema, induration or drainage. JP x2 with serosanguinous drainage. Extremities: warm and well-perfused   Lab Results:  Recent Labs    11/02/20 0120 11/03/20 0254  WBC 10.4 10.5  HGB 12.2* 12.8*  HCT 37.4* 37.7*  PLT 146* 150   BMET Recent Labs    11/02/20 0120 11/03/20 0254  NA 133* 140  K 3.1* 3.6  CL 101 105  CO2 25 26  GLUCOSE 435* 134*  BUN 10 8  CREATININE 0.58* 0.49*  CALCIUM 7.6* 8.6*   PT/INR No results for input(s): LABPROT, INR in the last 72 hours. CMP     Component Value Date/Time   NA 140 11/03/2020 0254   NA 140 03/28/2020 0000   K 3.6 11/03/2020 0254   CL 105 11/03/2020 0254   CO2 26 11/03/2020 0254   GLUCOSE 134 (H) 11/03/2020 0254   BUN 8 11/03/2020 0254   BUN 9 03/28/2020 0000   CREATININE 0.49 (L) 11/03/2020 0254   CREATININE 0.73 10/30/2013 1606   CALCIUM 8.6 (L) 11/03/2020 0254   PROT 5.4 (L)  11/03/2020 0254   ALBUMIN 2.6 (L) 11/03/2020 0254   AST 32 11/03/2020 0254   ALT 37 11/03/2020 0254   ALKPHOS 56 11/03/2020 0254   BILITOT 0.8 11/03/2020 0254   GFRNONAA >60 11/03/2020 0254   GFRNONAA >89 10/30/2013 1606   GFRAA 108 03/28/2020 0000   GFRAA >89 10/30/2013 1606   Lipase     Component Value Date/Time   LIPASE 19 09/22/2020 1733        Assessment/Plan 65 yo male with cholangiocarcinoma, POD3 s/p Whipple. - Drains remain serosanguinous. Drain amylases sent this morning, results pending. - Remain on clear liquid diet, continue IVF at 50 ml/hr until PO intake improves - COPD and OSA: home CPAP at night, prn albuterol - Pain control: scheduled tylenol, prn oxycodone and dilaudid - Home meds as appropriate - Daily PPI - Sliding scale insulin q4h, minimal insulin requirements - Mobilize, PT following - VTE: lovenox, SCDs - Dispo: transfer to med-surg floor   LOS: 3 days    Michaelle Birks, MD [facility] Surgery General, Hepatobiliary and Pancreatic Surgery 11/03/20 8:26 AM

## 2020-11-03 NOTE — Progress Notes (Signed)
Physical Therapy Treatment & Discharge Patient Details Name: [name] [name] MRN: [id number] DOB: 1955/05/20 Today's Date: 11/03/2020   History of Present Illness Pt is a 65 y.o. 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 PMH includes COPD, DM, HTN, OSA, PTSD, bipolar disorder, depression, prior ETOH use.   PT Comments    Pt progressing well with mobility; independent with transfers and ambulation, only requiring assist for line set-up. Pt demonstrates significantly improved pain and activity tolerance; has met short-term acute PT goals. Reviewed educ re: activity recommendations, abdominal precautions, importance of mobility. Pt's wife present and supportive; pt and wife report no further questions or concerns. Will d/c acute PT    Recommendations for follow up therapy are one component of a multi-disciplinary discharge planning process, led by the attending physician.  Recommendations may be updated based on patient status, additional functional criteria and insurance authorization.  Follow Up Recommendations  No PT follow up;Supervision - Intermittent     Equipment Recommendations  None recommended by PT    Recommendations for Other Services       Precautions / Restrictions Precautions Precautions: Other (comment) Precaution Comments: 2x JP drains; abdominal for comfort Restrictions Weight Bearing Restrictions: No     Mobility  Bed Mobility               General bed mobility comments: Received sitting in recliner    Transfers Overall transfer level: Independent Equipment used: None                Ambulation/Gait Ambulation/Gait assistance: Nurse, learning disability (Feet): 400 Feet Assistive device: None;IV Pole Gait Pattern/deviations: Step-through pattern;Decreased stride length Gait velocity: Decreased   General Gait Details: Slow, steady gait indep with and without pushing IV pole, noted improvement in pain; no overt  instability or LOB with head turns, side steps, direction changes   Stairs Stairs:  (pt declined, reports feeling comfortable ascending steps into home with bilateral rail support available)           Wheelchair Mobility    Modified Rankin (Stroke Patients Only)       Balance Overall balance assessment: No apparent balance deficits (not formally assessed)                                          Cognition Arousal/Alertness: Awake/alert Behavior During Therapy: WFL for tasks assessed/performed Overall Cognitive Status: Within Functional Limits for tasks assessed                                        Exercises      General Comments General comments (skin integrity, edema, etc.): Wife present and supportive. Reviewed educ re: activity recommendations, abdominal precautions, importance of mobility      Pertinent Vitals/Pain Pain Assessment: Faces Faces Pain Scale: Hurts a little bit Pain Location: Abdomen Pain Descriptors / Indicators: Heaviness Pain Intervention(s): Monitored during session    Home Living                      Prior Function            PT Goals (current goals can now be found in the care plan section) Progress towards PT goals: Goals met/education completed, patient discharged from PT    Frequency  PT Plan Current plan remains appropriate    Co-evaluation              AM-PAC PT "6 Clicks" Mobility   Outcome Measure  Help needed turning from your back to your side while in a flat bed without using bedrails?: None Help needed moving from lying on your back to sitting on the side of a flat bed without using bedrails?: None Help needed moving to and from a bed to a chair (including a wheelchair)?: None Help needed standing up from a chair using your arms (e.g., wheelchair or bedside chair)?: None Help needed to walk in hospital room?: None Help needed climbing 3-5 steps with a  railing? : None 6 Click Score: 24    End of Session   Activity Tolerance: Patient tolerated treatment well Patient left: in chair;with call bell/phone within reach;with family/visitor present Nurse Communication: Mobility status PT Visit Diagnosis: Other abnormalities of gait and mobility (R26.89);Pain     Time: 0266-9167 PT Time Calculation (min) (ACUTE ONLY): 15 min  Charges:  $Therapeutic Exercise: 8-22 mins                     Mabeline Caras, PT, DPT Acute Rehabilitation Services  Pager [phone] Office [place] 11/03/2020, 5:03 PM

## 2020-11-04 LAB — COMPREHENSIVE METABOLIC PANEL
ALT: 29 U/L (ref 0–44)
AST: 21 U/L (ref 15–41)
Albumin: 2.3 g/dL — ABNORMAL LOW (ref 3.5–5.0)
Alkaline Phosphatase: 56 U/L (ref 38–126)
Anion gap: 9 (ref 5–15)
BUN: 7 mg/dL — ABNORMAL LOW (ref 8–23)
CO2: 26 mmol/L (ref 22–32)
Calcium: 8.6 mg/dL — ABNORMAL LOW (ref 8.9–10.3)
Chloride: 101 mmol/L (ref 98–111)
Creatinine, Ser: 0.46 mg/dL — ABNORMAL LOW (ref 0.61–1.24)
GFR, Estimated: >60 mL/min (ref >60)
Glucose, Bld: 107 mg/dL — ABNORMAL HIGH (ref 70–99)
Potassium: 3.6 mmol/L (ref 3.5–5.1)
Sodium: 136 mmol/L (ref 135–145)
Total Bilirubin: 0.9 mg/dL (ref 0.3–1.2)
Total Protein: 5 g/dL — ABNORMAL LOW (ref 6.5–8.1)

## 2020-11-04 LAB — CBC
HCT: 33.1 % — ABNORMAL LOW (ref 39.0–52.0)
Hemoglobin: 11.5 g/dL — ABNORMAL LOW (ref 13.0–17.0)
MCH: 35.3 pg — ABNORMAL HIGH (ref 26.0–34.0)
MCHC: 34.7 g/dL (ref 30.0–36.0)
MCV: 101.5 fL — ABNORMAL HIGH (ref 80.0–100.0)
Platelets: 154 10*3/uL (ref 150–400)
RBC: 3.26 MIL/uL — ABNORMAL LOW (ref 4.22–5.81)
RDW: 12.5 % (ref 11.5–15.5)
WBC: 10 10*3/uL (ref 4.0–10.5)
nRBC: 0 % (ref 0.0–0.2)

## 2020-11-04 LAB — GLUCOSE, CAPILLARY
Glucose-Capillary: 103 mg/dL — ABNORMAL HIGH (ref 70–99)
Glucose-Capillary: 104 mg/dL — ABNORMAL HIGH (ref 70–99)
Glucose-Capillary: 108 mg/dL — ABNORMAL HIGH (ref 70–99)
Glucose-Capillary: 126 mg/dL — ABNORMAL HIGH (ref 70–99)
Glucose-Capillary: 139 mg/dL — ABNORMAL HIGH (ref 70–99)
Glucose-Capillary: 143 mg/dL — ABNORMAL HIGH (ref 70–99)
Glucose-Capillary: 135 mg/dL — ABNORMAL HIGH (ref 70–99)

## 2020-11-04 LAB — AMYLASE, BODY FLUID (OTHER)
Amylase, Body Fluid: 55 U/L
Amylase, Body Fluid: 65 U/L

## 2020-11-04 MED ORDER — PANTOPRAZOLE SODIUM 40 MG PO TBEC
40.0000 mg | DELAYED_RELEASE_TABLET | Freq: Every day | ORAL | Status: DC
  Administered 2020-11-04 – 2020-11-07 (×4): 40 mg via ORAL
  Filled 2020-11-04 (×4): qty 1

## 2020-11-04 MED ORDER — KETOROLAC TROMETHAMINE 15 MG/ML IJ SOLN
15.0000 mg | Freq: Three times a day (TID) | INTRAMUSCULAR | Status: AC
  Administered 2020-11-04 – 2020-11-05 (×4): 15 mg via INTRAVENOUS
  Filled 2020-11-04 (×5): qty 1

## 2020-11-04 MED ORDER — DOCUSATE SODIUM 100 MG PO CAPS
100.0000 mg | ORAL_CAPSULE | Freq: Two times a day (BID) | ORAL | Status: DC
  Administered 2020-11-04 – 2020-11-07 (×7): 100 mg via ORAL
  Filled 2020-11-04 (×7): qty 1

## 2020-11-04 NOTE — Progress Notes (Signed)
    4 Days Post-Op  Subjective: Nausea improved, no vomiting. Passing some flatus, no bowel movement yet. Drain output high but nonbilious. Afebrile, vitals stable. Making good progress with mobility.   Objective: Vital signs in last 24 hours: Temp:  [98.3 F (36.8 C)-98.8 F (37.1 C)] 98.4 F (36.9 C) (09/16 0746) Pulse Rate:  [54-123] 76 (09/16 0802) Resp:  [11-34] 17 (09/16 0802) BP: (125-161)/(74-105) 142/105 (09/16 0800) SpO2:  [84 %-100 %] 100 % (09/16 0802) Weight:  [80.4 kg] 80.4 kg (09/16 0600) Last BM Date:  (pta)  Intake/Output from previous day: 09/15 0701 - 09/16 0700 In: 1474.4 [P.O.:240; I.V.:1234.4] Out: 1991 [Urine:950; Drains:1040; Stool:1] Intake/Output this shift: No intake/output data recorded.  PE: General: resting comfortably, NAD Neuro: alert and oriented, no focal deficits Resp: normal work of breathing on room air CV: RRR Abdomen: soft, nondistended, nontender to palpation. Midline incision clean and dry with no erythema, induration or drainage. JP x2 with serosanguinous drainage. Extremities: warm and well-perfused   Lab Results:  Recent Labs    11/03/20 0254 11/04/20 0138  WBC 10.5 10.0  HGB 12.8* 11.5*  HCT 37.7* 33.1*  PLT 150 154   BMET Recent Labs    11/03/20 0254 11/04/20 0138  NA 140 136  K 3.6 3.6  CL 105 101  CO2 26 26  GLUCOSE 134* 107*  BUN 8 7*  CREATININE 0.49* 0.46*  CALCIUM 8.6* 8.6*   PT/INR No results for input(s): LABPROT, INR in the last 72 hours. CMP     Component Value Date/Time   NA 136 11/04/2020 0138   NA 140 03/28/2020 0000   K 3.6 11/04/2020 0138   CL 101 11/04/2020 0138   CO2 26 11/04/2020 0138   GLUCOSE 107 (H) 11/04/2020 0138   BUN 7 (L) 11/04/2020 0138   BUN 9 03/28/2020 0000   CREATININE 0.46 (L) 11/04/2020 0138   CREATININE 0.73 10/30/2013 1606   CALCIUM 8.6 (L) 11/04/2020 0138   PROT 5.0 (L) 11/04/2020 0138   ALBUMIN 2.3 (L) 11/04/2020 0138   AST 21 11/04/2020 0138   ALT 29  11/04/2020 0138   ALKPHOS 56 11/04/2020 0138   BILITOT 0.9 11/04/2020 0138   GFRNONAA >60 11/04/2020 0138   GFRNONAA >89 10/30/2013 1606   GFRAA 108 03/28/2020 0000   GFRAA >89 10/30/2013 1606   Lipase     Component Value Date/Time   LIPASE 19 09/22/2020 1733        Assessment/Plan 65 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 - Drains remain serosanguinous. Drain amylases pending. - Advance to full liquid diet, SLIV. - COPD and OSA: home CPAP at night, prn albuterol - Pain control: scheduled tylenol and toradol, prn oxycodone and dilaudid - Home meds as appropriate - Daily PPI - Sliding scale insulin q4h, minimal insulin requirements - Ambulate TID - VTE: lovenox, SCDs - Dispo: transfer to med-surg floor pending bed availability   LOS: 4 days    Michaelle Birks, MD [name] Surgery General, Hepatobiliary and Pancreatic Surgery 11/04/20 8:13 AM

## 2020-11-05 LAB — CBC
HCT: 30.5 % — ABNORMAL LOW (ref 39.0–52.0)
Hemoglobin: 10.7 g/dL — ABNORMAL LOW (ref 13.0–17.0)
MCH: 34.9 pg — ABNORMAL HIGH (ref 26.0–34.0)
MCHC: 35.1 g/dL (ref 30.0–36.0)
MCV: 99.3 fL (ref 80.0–100.0)
Platelets: 159 10*3/uL (ref 150–400)
RBC: 3.07 MIL/uL — ABNORMAL LOW (ref 4.22–5.81)
RDW: 12.3 % (ref 11.5–15.5)
WBC: 7.4 10*3/uL (ref 4.0–10.5)
nRBC: 0 % (ref 0.0–0.2)

## 2020-11-05 LAB — COMPREHENSIVE METABOLIC PANEL
ALT: 40 U/L (ref 0–44)
AST: 34 U/L (ref 15–41)
Albumin: 2.1 g/dL — ABNORMAL LOW (ref 3.5–5.0)
Alkaline Phosphatase: 85 U/L (ref 38–126)
Anion gap: 6 (ref 5–15)
BUN: 8 mg/dL (ref 8–23)
CO2: 26 mmol/L (ref 22–32)
Calcium: 8.4 mg/dL — ABNORMAL LOW (ref 8.9–10.3)
Chloride: 103 mmol/L (ref 98–111)
Creatinine, Ser: 0.52 mg/dL — ABNORMAL LOW (ref 0.61–1.24)
GFR, Estimated: >60 mL/min (ref >60)
Glucose, Bld: 102 mg/dL — ABNORMAL HIGH (ref 70–99)
Potassium: 3.5 mmol/L (ref 3.5–5.1)
Sodium: 135 mmol/L (ref 135–145)
Total Bilirubin: 0.6 mg/dL (ref 0.3–1.2)
Total Protein: 4.7 g/dL — ABNORMAL LOW (ref 6.5–8.1)

## 2020-11-05 LAB — GLUCOSE, CAPILLARY
Glucose-Capillary: 137 mg/dL — ABNORMAL HIGH (ref 70–99)
Glucose-Capillary: 145 mg/dL — ABNORMAL HIGH (ref 70–99)

## 2020-11-05 MED ORDER — POTASSIUM CHLORIDE 20 MEQ PO PACK
40.0000 meq | PACK | Freq: Two times a day (BID) | ORAL | Status: DC
  Administered 2020-11-05 – 2020-11-06 (×4): 40 meq via ORAL
  Filled 2020-11-05 (×5): qty 2

## 2020-11-05 MED ORDER — INSULIN ASPART 100 UNIT/ML IJ SOLN
0.0000 [IU] | Freq: Three times a day (TID) | INTRAMUSCULAR | Status: DC
  Administered 2020-11-05 – 2020-11-06 (×3): 2 [IU] via SUBCUTANEOUS
  Administered 2020-11-06 – 2020-11-07 (×3): 3 [IU] via SUBCUTANEOUS

## 2020-11-05 NOTE — Progress Notes (Signed)
RT arrived to room to see if patient needed any assistance with CPAP and pt was already on Home unit and tolerating well at this time.

## 2020-11-05 NOTE — Progress Notes (Signed)
    5 Days Post-Op  Subjective: No complaints this morning.  Slept well, denies nausea this morning except for feeling some bloatedness from his CPAP which he thinks he will stop using at this point.   Objective: Vital signs in last 24 hours: Temp:  [98.2 F (36.8 C)-98.6 F (37 C)] 98.3 F (36.8 C) (09/17 0634) Pulse Rate:  [32-195] 80 (09/16 1510) Resp:  [10-27] 19 (09/16 1505) BP: (131-156)/(81-102) 156/88 (09/17 0309) SpO2:  [76 %-100 %] 97 % (09/16 1930) Weight:  [79.5 kg] 79.5 kg (09/17 0600) Last BM Date: 11/04/20  Intake/Output from previous day: 09/16 0701 - 09/17 0700 In: 209.6 [P.O.:200; I.V.:9.6] Out: 1175 [Urine:500; Drains:675] Intake/Output this shift: Total I/O In: -  Out: 77 [Drains:50]  PE: General: resting comfortably, NAD Neuro: alert and oriented, no focal deficits Resp: normal work of breathing on room air CV: RRR Abdomen: soft, nondistended, nontender to palpation. Midline incision clean and dry with no erythema, induration or drainage. JP x2 with serosanguinous drainage. Extremities: warm and well-perfused   Lab Results:  Recent Labs    11/04/20 0138 11/05/20 0140  WBC 10.0 7.4  HGB 11.5* 10.7*  HCT 33.1* 30.5*  PLT 154 159   BMET Recent Labs    11/04/20 0138 11/05/20 0140  NA 136 135  K 3.6 3.5  CL 101 103  CO2 26 26  GLUCOSE 107* 102*  BUN 7* 8  CREATININE 0.46* 0.52*  CALCIUM 8.6* 8.4*   PT/INR No results for input(s): LABPROT, INR in the last 72 hours. CMP     Component Value Date/Time   NA 135 11/05/2020 0140   NA 140 03/28/2020 0000   K 3.5 11/05/2020 0140   CL 103 11/05/2020 0140   CO2 26 11/05/2020 0140   GLUCOSE 102 (H) 11/05/2020 0140   BUN 8 11/05/2020 0140   BUN 9 03/28/2020 0000   CREATININE 0.52 (L) 11/05/2020 0140   CREATININE 0.73 10/30/2013 1606   CALCIUM 8.4 (L) 11/05/2020 0140   PROT 4.7 (L) 11/05/2020 0140   ALBUMIN 2.1 (L) 11/05/2020 0140   AST 34 11/05/2020 0140   ALT 40 11/05/2020 0140    ALKPHOS 85 11/05/2020 0140   BILITOT 0.6 11/05/2020 0140   GFRNONAA >60 11/05/2020 0140   GFRNONAA >89 10/30/2013 1606   GFRAA 108 03/28/2020 0000   GFRAA >89 10/30/2013 1606   Lipase     Component Value Date/Time   LIPASE 19 09/22/2020 1733        Assessment/Plan 65 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 - Drains remain serosanguinous. Drain amylases 65 (#2) and 55 (#1) - Advance to soft diet. - COPD and OSA: home CPAP at night as tolerated, prn albuterol - Pain control: scheduled tylenol and toradol, prn oxycodone and dilaudid - Home meds as appropriate - Daily PPI - Sliding scale insulin q4h, minimal insulin requirements - Ambulate TID - VTE: lovenox, SCDs - Dispo: transfer to med-surg floor pending bed availability   LOS: 5 days    [facility] 11/05/20 8:10 AM

## 2020-11-06 LAB — GLUCOSE, CAPILLARY
Glucose-Capillary: 170 mg/dL — ABNORMAL HIGH (ref 70–99)
Glucose-Capillary: 153 mg/dL — ABNORMAL HIGH (ref 70–99)
Glucose-Capillary: 106 mg/dL — ABNORMAL HIGH (ref 70–99)
Glucose-Capillary: 139 mg/dL — ABNORMAL HIGH (ref 70–99)

## 2020-11-06 NOTE — Progress Notes (Signed)
Pt has home CPAP at bedside but states he is not going to wear it tonight due to some pain in his belly from the pressure of his CPAP last night. RT told pt to call if he changes his mind and needs help.

## 2020-11-06 NOTE — Progress Notes (Addendum)
6 Days Post-Op    CC: Cholangiocarcinoma  Subjective: Patient was up in the bathroom when I came in.  He is tolerating the diet well and ambulating safely.  Midline incision looks good.  He has had bowel movements yesterday and today.  He is complaining of some abdominal tightness this morning but overall doing well he thinks is just gas.  Objective: Vital signs in last 24 hours: Temp:  [98 F (36.7 C)-98.9 F (37.2 C)] 98 F (36.7 C) (09/18 0800) Pulse Rate:  [67-81] 69 (09/18 0800) Resp:  [14-18] 16 (09/18 0800) BP: (135-151)/(84-98) 151/97 (09/18 0800) SpO2:  [96 %-100 %] 99 % (09/18 0800) Last BM Date: 11/04/20 120 p.o. recorded No other intake recorded Urine x1 recorded Drain 1 right ventral 170 cc Drain to medial bulb 535 cc   Afebrile vital s no labs today last potassium was 3.5 igns are stable, blood pressure is mildly elevated. No labs today his last potassium was 3.5  Intake/Output from previous day: 09/17 0701 - 09/18 0700 In: 120 [P.O.:120] Out: 705 [Drains:705] Intake/Output this shift: No intake/output data recorded.  General appearance: alert, cooperative, and no distress Resp: clear to auscultation bilaterally GI: Soft, positive bowel sounds tolerating diet, midline incision looks good.  Positive BMs.  He has had a fair amount.  His drains 170 on the right and 535 from the medial drain.  Lab Results:  Recent Labs    11/04/20 0138 11/05/20 0140  WBC 10.0 7.4  HGB 11.5* 10.7*  HCT 33.1* 30.5*  PLT 154 159    BMET Recent Labs    11/04/20 0138 11/05/20 0140  NA 136 135  K 3.6 3.5  CL 101 103  CO2 26 26  GLUCOSE 107* 102*  BUN 7* 8  CREATININE 0.46* 0.52*  CALCIUM 8.6* 8.4*   PT/INR No results for input(s): LABPROT, INR in the last 72 hours.  Recent Labs  Lab 11/01/20 0354 11/02/20 0120 11/03/20 0254 11/04/20 0138 11/05/20 0140  AST 59* 39 32 21 34  ALT 47* 41 37 29 40  ALKPHOS 52 41 56 56 85  BILITOT 0.7 0.8 0.8 0.9 0.6  PROT  5.5* 4.9* 5.4* 5.0* 4.7*  ALBUMIN 3.1* 2.4* 2.6* 2.3* 2.1*     Lipase     Component Value Date/Time   LIPASE 19 09/22/2020 1733     Medications:  acetaminophen  650 mg Oral Q6H   aspirin EC  81 mg Oral Daily   busPIRone  5 mg Oral QHS   chlorhexidine  15 mL Mouth Rinse BID   Chlorhexidine Gluconate Cloth  6 each Topical Q0600   cloNIDine  0.1 mg Oral BID   docusate sodium  100 mg Oral BID   enoxaparin (LOVENOX) injection  40 mg Subcutaneous Q24H   insulin aspart  0-15 Units Subcutaneous TID AC & HS   mouth rinse  15 mL Mouth Rinse q12n4p   pantoprazole  40 mg Oral Daily   PARoxetine  60 mg Oral QHS   potassium chloride  40 mEq Oral BID   QUEtiapine  50 mg Oral QHS   tiaGABine  8 mg Oral QHS   Anti-infectives (From admission, onward)    Start     Dose/Rate Route Frequency Ordered Stop   10/31/20 0600  cefTRIAXone (ROCEPHIN) 2 g in sodium chloride 0.9 % 100 mL IVPB       See Hyperspace for full Linked Orders Report.   2 g 200 mL/hr over 30 Minutes Intravenous On call  to O.R. 10/31/20 0549 10/31/20 1713   10/31/20 0600  metroNIDAZOLE (FLAGYL) IVPB 500 mg       See Hyperspace for full Linked Orders Report.   500 mg 100 mL/hr over 60 Minutes Intravenous On call to O.R. 10/31/20 0549 10/31/20 0739           Assessment/Plan  Cholangiocarcinoma POD #6 Staging laparoscopy, pylorus-preserving pancreatoduodenectomy(Whipple procedure) 10/31/2020 Dr. Michaelle Birks Plan: Continue to mobilize today.  He continues to do well aim for discharge home tomorrow.  Will defer drains to Dr. Wilburn Cornelia.  FEN: Soft diet ID: Flagyl/Rocephin preop DVT: Lovenox Follow-up: Dr. Zenia Resides Pain/anxiety:gabitril, Seroquel, Paxil, Toradol 15 mg x 3, BuSpar at bedtime, Tylenol 650x4  Hx COPD Diabetes mellitus without complication Sleep apnea Hx substance abuse Hyperlipidemia Hx anxiety        LOS: 6 days    [name],[name] 11/06/2020 Please see Amion   I have evaluated this patient and  agree with this assessment and plan.  He is feeling really well, tolerating p.o. without nausea, and had another bowel movement this morning.  Abdomen is benign, drains are serous, but volume significantly increased specifically in drain #2.  Amylase levels were low previously (65 from #2 and 55 #1) but given volume will leave drains in place for now.  Anticipate discharge in the next 24 to 48 hours.Marland Kitchen

## 2020-11-07 LAB — GLUCOSE, CAPILLARY
Glucose-Capillary: 182 mg/dL — ABNORMAL HIGH (ref 70–99)
Glucose-Capillary: 101 mg/dL — ABNORMAL HIGH (ref 70–99)
Glucose-Capillary: 104 mg/dL — ABNORMAL HIGH (ref 70–99)
Glucose-Capillary: 104 mg/dL — ABNORMAL HIGH (ref 70–99)
Glucose-Capillary: 143 mg/dL — ABNORMAL HIGH (ref 70–99)
Glucose-Capillary: 153 mg/dL — ABNORMAL HIGH (ref 70–99)
Glucose-Capillary: 98 mg/dL (ref 70–99)

## 2020-11-07 MED ORDER — PANTOPRAZOLE SODIUM 40 MG PO TBEC: 40.0000 mg | DELAYED_RELEASE_TABLET | Freq: Every day | ORAL | 2 refills | Status: DC

## 2020-11-07 MED ORDER — DOCUSATE SODIUM 100 MG PO CAPS: 100.0000 mg | ORAL_CAPSULE | Freq: Two times a day (BID) | ORAL | 0 refills | Status: DC

## 2020-11-07 MED ORDER — OXYCODONE HCL 5 MG PO TABS: 5.0000 mg | ORAL_TABLET | Freq: Four times a day (QID) | ORAL | 0 refills | Status: DC | PRN

## 2020-11-07 MED ORDER — ACETAMINOPHEN 325 MG PO TABS: 650.0000 mg | ORAL_TABLET | Freq: Four times a day (QID) | ORAL | Status: DC | PRN

## 2020-11-07 NOTE — Discharge Instructions (Addendum)
[facility] DISCHARGE INSTRUCTIONS: WHIPPLE PROCEDURE  Activity No heavy lifting greater than 15 pounds for 6 weeks after surgery. Ok to shower, but do not bathe or submerge incision underwater. Do not drive while taking narcotic pain medication. Be sure to walk around your home at least 3 times daily. This will help avoid blood clots and will improve your strength.  Wound Care Your incision is covered with skin glue called Dermabond. This will peel off on its own over time. You may shower and allow warm soapy water to run over your incisions. Gently pat dry. Do not submerge your incision underwater. Monitor your incision for any new redness, tenderness, or drainage.  Medications You may take Tylenol up to 4 times daily for pain. If you have more severe pain that is not covered by Tylenol, you may take oxycodone up to every 6 hours as needed. Please reserve this medication only for the most severe pain. You should take Protonix every day for 3 months.  Diet You may notice that you feel full early after meals, or have occasional nausea or decreased appetite. This is normal following a Whipple and will improve with time. It is better to consume small frequent meals throughout the day rather than 3 large meals. Avoid drinking a lot of carbonated beverages, as these can worsen symptoms of nausea and reflux. If you are having frequent vomiting after meals or are unable to keep down any food or drink, please call the office right away.  When to Call us: Fever greater than 100.5 New redness, drainage, or swelling at incision site Severe pain, nausea, or vomiting Excessive vomiting or inability to keep down any liquids Low blood sugar or very high blood sugar (greater than 300) Jaundice (yellowing of the whites of the eyes or skin)  Follow-up You have an appointment scheduled with Dr. Zenia Resides on November 16, 2020 at 10:30am. This will be at the [facility] office at  [address] [place], [place], [place]. Please arrive at least 15 minutes prior to your scheduled appointment time.  For questions or concerns, please call the office at [phone].

## 2020-11-07 NOTE — Progress Notes (Signed)
Discharge instructions explained and given to patient. IV removed by patient. Patient dressed himself and declined wheelchair assistance off the unit. Patient states his wife will transport him home.

## 2020-11-07 NOTE — Care Management Important Message (Signed)
Important Message  Patient Details  Name: [name] [name] MRN: [id number] Date of Birth: December 24, 1955   Medicare Important Message Given:  Yes     Memory Argue 11/07/2020, 1:41 PM

## 2020-11-07 NOTE — Care Management Important Message (Signed)
Important Message  Patient Details  Name: [name] [name] MRN: [id number] Date of Birth: [birth year]   Medicare Important Message Given:  Yes Patient left prior to IM delivery will mail to the patient home address.    Elsworth Ledin 11/07/2020, 4:19 PM

## 2020-11-08 LAB — TYPE AND SCREEN
ABO/RH(D): O POS
Antibody Screen: NEGATIVE
Unit division: 0
Unit division: 0
Unit division: 0
Unit division: 0

## 2020-11-08 LAB — BPAM RBC
Blood Product Expiration Date: 202210092359
Blood Product Expiration Date: 202210092359
Blood Product Expiration Date: 202210092359
Blood Product Expiration Date: 202210112359
ISSUE DATE / TIME: 202209131204
ISSUE DATE / TIME: 202209151842
Unit Type and Rh: 5100
Unit Type and Rh: 5100
Unit Type and Rh: 5100
Unit Type and Rh: 5100

## 2020-11-09 ENCOUNTER — Other Ambulatory Visit: Payer: Self-pay

## 2020-11-09 ENCOUNTER — Telehealth: Payer: Self-pay

## 2020-11-09 NOTE — Progress Notes (Signed)
The proposed treatment discussed in conference is for discussion purpose only and is not a binding recommendation.  The patients have not been physically examined, or presented with their treatment options.  Therefore, final treatment plans cannot be decided.  

## 2020-11-09 NOTE — Telephone Encounter (Signed)
Transition Care Management Unsuccessful Follow-up Telephone Call  Date of discharge and from where:  11/07/2020 [name] [name]  Attempts:  1st Attempt  Reason for unsuccessful TCM follow-up call:  Unable to reach patient  Placed call to patient and wife answered the phone. Patient not present to give me permission to speak with wife. Will call back to complete TOC at a later time.  Tomasa Rand, RN, BSN, CEN [facility] [employer] [phone]

## 2020-11-10 ENCOUNTER — Telehealth: Payer: Self-pay

## 2020-11-10 NOTE — Telephone Encounter (Signed)
Transition Care Management Follow-up Telephone Call Patient not home. Transition of Care call completed with wife (DPR) who states she assist patient with management of health care needs. Confirmed two patient identifiers. Date of discharge and from where: 11/07/20 from [facility] How have you been since you were released from the hospital? "He is doing fantastic" Any questions or concerns? No  Items Reviewed: Did the pt receive and understand the discharge instructions provided? Yes  Medications obtained and verified? Yes  Other? No  Any new allergies since your discharge? No  Dietary orders reviewed? Yes Do you have support at home? Yes   Home Care and Equipment/Supplies: Were home health services ordered? no If so, what is the name of the agency?   Has the agency set up a time to come to the patient's home? not applicable Were any new equipment or medical supplies ordered?  No What is the name of the medical supply agency?  Were you able to get the supplies/equipment? not applicable Do you have any questions related to the use of the equipment or supplies? No  Functional Questionnaire: (I = Independent and D = Dependent) ADLs: I  Bathing/Dressing- I  Meal Prep- I  Eating- I  Maintaining continence- I  Transferring/Ambulation- I  Managing Meds- I  Follow up appointments reviewed: PCP Hospital f/u appt confirmed? Following up with specialist.   [facility] f/u appt confirmed? Yes  Scheduled to see Dr. Zenia Resides on 11/16/20 @ 10 am. Dr. Burr Medico on 11/23/20 at 2 pm. Are transportation arrangements needed? No  If their condition worsens, is the pt aware to call PCP or go to the Emergency Dept.? Yes Was the patient provided with contact information for the PCP's office or ED? Yes Was to pt encouraged to call back with questions or concerns? Yes   Thea Silversmith, RN, MSN, BSN, [place] Care Management Coordinator [phone]

## 2020-11-11 NOTE — Discharge Summary (Signed)
Physician Discharge Summary   Patient ID: [name] [name] [id number] 65 y.o. [birth year]  Admit date: 10/31/2020  Discharge date and time: 11/07/2020  9:49 AM   Admitting Physician: Dwan Bolt, MD   Discharge Physician: Michaelle Birks, MD  Admission Diagnoses: Cholangiocarcinoma [facility]) [C22.1]  Discharge Diagnoses: Cholangiocarcinoma  Admission Condition: good  Discharged Condition: good  Indication for Admission: [name] [name] is a 65 year old male who presented with obstructive jaundice and on further work-up was found to have a mass in the proximal common bile duct at the cystic duct junction.  Biopsy confirmed cholangiocarcinoma, and staging work-up showed no evidence of metastatic disease.  He agreed to proceed with surgical resection via Whipple.  Hospital Course: The patient was taken to the operating room on 10/31/2020 for staging laparoscopy and a Whipple procedure.  Further details of the procedure please see separately dictated operative note.  2 JP drains were left as well as an NG tube.  Postoperatively the patient was admitted to the ICU for further care.  On the morning of postop day 1 he was hemodynamically stable.  His NG tube and Foley catheter were removed and he was able to void spontaneously.  He was noted to have bilious drainage from his posterior JP drain, which resolved spontaneously by postop day 2.  He remained afebrile and hemodynamically stable.  His diet was slowly advanced over the next few days to clear liquid diet and then a soft diet.  He tolerated this with no nausea or vomiting.  Drain amylase is on postoperative day 3 were low.  He worked with physical therapy and was able to ambulate.  Was transition to oral pain medications.  The morning of postop day 7, his drains remained nonbilious and he was afebrile and hemodynamically stable.  Both of his surgical drains were removed.  He was ambulating and tolerating a soft diet without difficulty.  He was having bowel  function.  He was examined and deemed appropriate for discharge home.  He is scheduled to follow-up in clinic in 2 weeks.  Consults: None  Significant Diagnostic Studies: none  Treatments: IV hydration, analgesia: acetaminophen, Dilaudid, and oxycodone, therapies: PT and OT, and surgery: staging laparoscopy, Whipple procedure  Discharge Exam: General: resting comfortably, NAD Neuro: alert and oriented, no focal deficits HEENT: no scleral icterus Resp: normal work of breathing on room air Abdomen: soft, nondistended, nontender to palpation. Midline incision clean and dry with no erythema, induration or drainage. Extremities: warm and well-perfused    Disposition: Discharge disposition: 01-Home or Self Care       Patient Instructions:  Allergies as of 11/07/2020   No Known Allergies      Medication List     TAKE these medications    acetaminophen 325 MG tablet Commonly known as: TYLENOL Take 2 tablets (650 mg total) by mouth every 6 (six) hours as needed for mild pain. Notes to patient: Pain    albuterol 108 (90 Base) MCG/ACT inhaler Commonly known as: VENTOLIN HFA Inhale 2 puffs into the lungs every 6 (six) hours as needed for wheezing or shortness of breath. Notes to patient: Breathing    aspirin EC 81 MG tablet Take 81 mg by mouth daily. Swallow whole. Notes to patient: Heart health    busPIRone 5 MG tablet Commonly known as: BUSPAR Take 5 mg by mouth at bedtime. Notes to patient: Anxiety    CHROMIUM PO Take 1 tablet by mouth daily. Notes to patient: Diabetes   clonazePAM 0.5 MG tablet  Commonly known as: KLONOPIN TAKE 1 TABLET BY MOUTH TWICE DAILY AS NEEDED What changed:  how much to take how to take this when to take this reasons to take this additional instructions Notes to patient: Panic/anxiety   cloNIDine 0.1 MG tablet Commonly known as: CATAPRES Take 0.1 mg by mouth 2 (two) times daily. Notes to patient: Blood pressure    docusate  sodium 100 MG capsule Commonly known as: COLACE Take 1 capsule (100 mg total) by mouth 2 (two) times daily. Notes to patient: Stool softener   doxylamine (Sleep) 25 MG tablet Commonly known as: UNISOM Take 25 mg by mouth at bedtime as needed for sleep. Notes to patient: Sleep    ferrous sulfate 324 MG Tbec Take 324 mg by mouth daily with breakfast. Notes to patient: Supplement    folic acid 1 MG tablet Commonly known as: FOLVITE Take 1 mg by mouth daily. Notes to patient: Supplement    magnesium gluconate 500 MG tablet Commonly known as: MAGONATE Take 500 mg by mouth daily. Notes to patient: Supplement    metFORMIN 1000 MG tablet Commonly known as: GLUCOPHAGE TAKE 1 TABLET(1000 MG) BY MOUTH TWICE DAILY WITH A MEAL Notes to patient: Diabetes    multivitamin with minerals Tabs tablet Take 1 tablet by mouth daily. Notes to patient: Supplement    oxyCODONE 5 MG immediate release tablet Commonly known as: Oxy IR/ROXICODONE Take 1 tablet (5 mg total) by mouth every 6 (six) hours as needed for severe pain.   pantoprazole 40 MG tablet Commonly known as: PROTONIX Take 1 tablet (40 mg total) by mouth daily. Notes to patient: Pain    PARoxetine 30 MG tablet Commonly known as: PAXIL Take 60 mg by mouth at bedtime. Notes to patient: Anxiety/depression    Potassium 99 MG Tabs Take 99 mg by mouth daily. Notes to patient: Supplement    QUEtiapine 50 MG tablet Commonly known as: SEROQUEL Take 50 mg by mouth at bedtime. Notes to patient: Depression    tiaGABine 4 MG tablet Commonly known as: GABITRIL Take 8 mg by mouth at bedtime. Notes to patient: Seizures   Vascepa 1 g capsule Generic drug: icosapent Ethyl TAKE 2 CAPSULES(2 GRAMS) BY MOUTH TWICE DAILY Notes to patient: Heart health    vitamin B-12 500 MCG tablet Commonly known as: CYANOCOBALAMIN Take 500 mcg by mouth daily before supper. Notes to patient: Supplement   vitamin C 1000 MG tablet Take 1,000 mg by  mouth daily. Notes to patient: Supplement    Vitamin D 50 MCG (2000 UT) tablet Take 2,000 Units by mouth daily. Notes to patient: Supplement    Vivitrol 380 MG Susr Generic drug: Naltrexone Inject 380 mg into the muscle every 30 (thirty) days.       Activity: no driving while on analgesics and no heavy lifting for 6 weeks Diet:  Soft diet Wound Care: keep wound clean and dry  Follow-up with Dr. Zenia Resides in 2 weeks.  Signed: Dwan Bolt 11/11/2020 3:06 PM

## 2020-11-23 ENCOUNTER — Other Ambulatory Visit (HOSPITAL_COMMUNITY): Payer: Self-pay

## 2020-11-23 ENCOUNTER — Inpatient Hospital Stay: Payer: Federal, State, Local not specified - PPO | Attending: Hematology | Admitting: Hematology

## 2020-11-23 ENCOUNTER — Other Ambulatory Visit: Payer: Self-pay

## 2020-11-23 ENCOUNTER — Encounter: Payer: Self-pay | Admitting: Hematology

## 2020-11-23 ENCOUNTER — Telehealth: Payer: Self-pay

## 2020-11-23 ENCOUNTER — Telehealth: Payer: Self-pay | Admitting: Pharmacist

## 2020-11-23 ENCOUNTER — Inpatient Hospital Stay: Payer: Federal, State, Local not specified - PPO

## 2020-11-23 VITALS — BP 148/98 | HR 79 | Temp 98.4°F | Resp 18 | Ht 70.0 in | Wt 179.0 lb

## 2020-11-23 DIAGNOSIS — F17210 Nicotine dependence, cigarettes, uncomplicated: Secondary | ICD-10-CM | POA: Insufficient documentation

## 2020-11-23 DIAGNOSIS — C221 Intrahepatic bile duct carcinoma: Secondary | ICD-10-CM | POA: Insufficient documentation

## 2020-11-23 DIAGNOSIS — Z79899 Other long term (current) drug therapy: Secondary | ICD-10-CM | POA: Diagnosis not present

## 2020-11-23 DIAGNOSIS — F1020 Alcohol dependence, uncomplicated: Secondary | ICD-10-CM | POA: Diagnosis not present

## 2020-11-23 LAB — CBC WITH DIFFERENTIAL (CANCER CENTER ONLY)
Abs Immature Granulocytes: 0.02 10*3/uL (ref 0.00–0.07)
Basophils Absolute: 0.1 10*3/uL (ref 0.0–0.1)
Basophils Relative: 1 %
Eosinophils Absolute: 0.3 10*3/uL (ref 0.0–0.5)
Eosinophils Relative: 3 %
HCT: 36.8 % — ABNORMAL LOW (ref 39.0–52.0)
Hemoglobin: 12.2 g/dL — ABNORMAL LOW (ref 13.0–17.0)
Immature Granulocytes: 0 %
Lymphocytes Relative: 27 %
Lymphs Abs: 2.5 10*3/uL (ref 0.7–4.0)
MCH: 32.4 pg (ref 26.0–34.0)
MCHC: 33.2 g/dL (ref 30.0–36.0)
MCV: 97.6 fL (ref 80.0–100.0)
Monocytes Absolute: 0.5 10*3/uL (ref 0.1–1.0)
Monocytes Relative: 5 %
Neutro Abs: 5.8 10*3/uL (ref 1.7–7.7)
Neutrophils Relative %: 64 %
Platelet Count: 270 10*3/uL (ref 150–400)
RBC: 3.77 MIL/uL — ABNORMAL LOW (ref 4.22–5.81)
RDW: 12.5 % (ref 11.5–15.5)
WBC Count: 9 10*3/uL (ref 4.0–10.5)
nRBC: 0 % (ref 0.0–0.2)

## 2020-11-23 LAB — CMP (CANCER CENTER ONLY)
ALT: 13 U/L (ref 0–44)
AST: 16 U/L (ref 15–41)
Albumin: 3.1 g/dL — ABNORMAL LOW (ref 3.5–5.0)
Alkaline Phosphatase: 89 U/L (ref 38–126)
Anion gap: 8 (ref 5–15)
BUN: 11 mg/dL (ref 8–23)
CO2: 27 mmol/L (ref 22–32)
Calcium: 8.7 mg/dL — ABNORMAL LOW (ref 8.9–10.3)
Chloride: 104 mmol/L (ref 98–111)
Creatinine: 0.65 mg/dL (ref 0.61–1.24)
GFR, Estimated: >60 mL/min (ref >60)
Glucose, Bld: 89 mg/dL (ref 70–99)
Potassium: 4.2 mmol/L (ref 3.5–5.1)
Sodium: 139 mmol/L (ref 135–145)
Total Bilirubin: 0.2 mg/dL — ABNORMAL LOW (ref 0.3–1.2)
Total Protein: 6.2 g/dL — ABNORMAL LOW (ref 6.5–8.1)

## 2020-11-23 MED ORDER — CAPECITABINE 500 MG PO TABS
1000.0000 mg/m2 | ORAL_TABLET | Freq: Two times a day (BID) | ORAL | 0 refills | Status: DC
  Filled 2020-11-23: qty 112, 14d supply, fill #0

## 2020-11-23 NOTE — Progress Notes (Addendum)
[place]   [phone] Fax:([phone]) [phone]   Clinic Follow up Note   Patient Care Team: Hoyt Koch, MD as PCP - General (Internal Medicine) Deneise Lever, MD as Consulting Physician (Pulmonary Disease) Dwan Bolt, MD as Consulting Physician (General Surgery) Kyung Rudd, MD as Consulting Physician (Radiation Oncology) [name], MD as Consulting Physician (Hematology) [name], MD as Consulting Physician (Hematology) Carol Ada, MD as Consulting Physician (Gastroenterology)  Date of Service:  11/23/2020  CHIEF COMPLAINT: f/u of extrahepatic cholangiocarcinoma  CURRENT THERAPY:  Pending adjuvant Xeloda   ASSESSMENT & PLAN:  [name] [name] is a 65 y.o. male with   1.  Extrahepatic cholangiocarcinoma, pT1cN0M0, stage I -presented to ED on 09/22/20 with jaundice and abdominal tenderness. He was found to have bilirubin of 15   -Abdomen MRI on 09/23/20 revealed obstructing soft tissue mass in proximal common bile duct. ERCP with bile duct brushing on 09/23/20 under Dr. Benson Norway confirmed adenocarcinoma.  He is status post CBD stent placement -PET scan and CT AP performed earlier today 10/10/20 showed no evidence of nodal or metastatic disease. -baseline CEA and CA 19-9 were normal. -he proceeded to whipple procedure on 10/31/20 under Dr. Zenia Resides. Pathology showed: ductal adenocarcinoma, 1.6 cm, involving ampulla of Vater and periductal connective tissue; 9 negative lymph nodes (0/9). Hepatic duct margin was positive.  -We discussed adjuvant treatment with Xeloda for 6 months, per NCCN guideline. He has recovered well, plan to start in 1-2 weeks --Chemotherapy consent: Side effects including but does not not limited to, fatigue, nausea, vomiting, diarrhea, mild hair loss, neuropathy, fluid retention, liver and kidney dysfunction, neutropenic fever, needed for blood transfusion, bleeding, skin toxicity especially hand-foot syndrome, were discussed with  patient in great detail. He agrees to proceed. -Plan to give Xeloda 1000mg /m2 q12h for day 1-14 evey 21 days for 6 month, per BILCAP trial data, dose reduction due to general tolerance issue.  -Due to the positive margin, he would probably benefit from adjuvant radiation.  He is scheduled to see Dr. Lisbeth Renshaw tomorrow.  Plan to discuss the timing of radiation with Dr. Lisbeth Renshaw.    2.  History of alcohol addiction, heavy smoking  -He was alcoholic, but has stopped completely.  He is still seeing psychiatrist and on medication for addiction  -he is a heavy smoker.  Has tried to switch to vaping but ultimately returned to smoking -I again discussed smoking cessation, he will try      PLAN:  -surgical path reviewed -I called in Xeloda 2000mg  q12h on day 1-14 every 21 days, starting on 10/10 or 10/17 after he receives it    No problem-specific Assessment & Plan notes found for this encounter.   SUMMARY OF ONCOLOGIC HISTORY: Oncology History Overview Note  Cancer Staging Cholangiocarcinoma [facility]) Staging form: Distal Bile Duct, AJCC 8th Edition - Pathologic stage from 10/31/2020: Stage I (pT1, pN0, cM0) - Signed by [name], MD on 11/22/2020    Cholangiocarcinoma ([place])  09/22/2020 Imaging   US Abdomen  IMPRESSION: Intrahepatic biliary ductal dilation and dilated proximal common bile duct. Findings are consistent with biliary obstruction, possibly due to a common bile duct mass, incompletely evaluated by ultrasound. Recommend multiphase CT or MRCP for further evaluation.   Mildly distended gallbladder filled with material of varying echogenicity, likely sludge and tiny stones. In the absence of leukocytosis and right upper quadrant pain this is unlikely to represent cholecystitis.   Coarsened liver echogenicity with somewhat nodular left hepatic lobe, compatible  with chronic liver disease.   09/22/2020 Imaging   CT AP  IMPRESSION: 1. Question bibasilar pulmonary fibrosis. 2. Homogeneous  hyperdensity fills the gallbladder lumen - likely represents gallbladder sludge. Intra and extrahepatic biliary ductal dilatation with no main pancreatic duct dilatation. Findings are consistent with biliary obstruction, possibly due to a common bile duct mass which is incompletely evaluated on this single phase portal venous study. Recommend MRCP for further evaluation. 3. Nonspecific perirectal fat stranding with under distension of the rectum. 4. Aortic Atherosclerosis (ICD10-I70.0).   09/23/2020 Imaging   MRI Abdomen MRCP  IMPRESSION: 1. Obstructing soft tissue mass in the proximal common bile duct at or adjacent to the junction with the cystic duct, associated with moderate to severe intrahepatic biliary ductal dilatation indicating obstruction, as discussed above. 2. Biliary sludge filling the gallbladder lumen.   09/23/2020 Procedure   ERCP, Dr. Benson Norway  Impression: - The major papilla appeared normal. - A single localized biliary stricture was found in the common bile duct. The stricture was malignant appearing. - The left and right hepatic ducts and all intrahepatic branches and common bile duct were moderately dilated, with a mass causing an obstruction. - A biliary sphincterotomy was performed. - Cells for cytology obtained in the upper third of the main bile duct. - One plastic biliary stent was placed into the common bile duct.   09/23/2020 Pathology Results   A. BILIARY, BRUSHING:   FINAL MICROSCOPIC DIAGNOSIS:  - Malignant cells consistent with adenocarcinoma    10/09/2020 Initial Diagnosis   Cholangiocarcinoma ([place])   10/10/2020 PET scan   IMPRESSION: 1. No evidence of metastatic disease. Common bile duct mass, better seen and described on MR abdomen 09/23/2020. 2. Common bile duct stent in place with persistent biliary ductal dilatation and associated pneumobilia. 3. Tiny left renal stone. 4. Bladder wall thickening. 5. Aortic atherosclerosis (ICD10-I70.0).  Coronary artery calcification. 6.  Emphysema (ICD10-J43.9).   10/10/2020 Imaging   CT AP  IMPRESSION: No suspicious liver lesions. Replaced right hepatic artery arising from the SMA.   Common bile duct tumor appears unchanged compared to prior CT. Common bile duct stent place.   Findings of fibrotic interstitial lung disease in the partially visualized lower chest, could be further evaluated with dedicated high-resolution chest CT.   10/31/2020 Cancer Staging   Staging form: Distal Bile Duct, AJCC 8th Edition - Pathologic stage from 10/31/2020: Stage I (pT1, pN0, cM0) - Signed by [name], MD on 11/22/2020 Stage prefix: Initial diagnosis Total positive nodes: 0 Residual tumor (R): R1 - Microscopic   10/31/2020 Surgery   Whipple Procedure, Dr. Michaelle Birks   10/31/2020 Pathology Results   FINAL MICROSCOPIC DIAGNOSIS:   A. COMMON HEPATIC DUCT MARGIN:  - Adenocarcinoma involving bile duct.   B. NEW COMMON HEPATIC DUCT MARGIN:  - Atypical glands consistent with adenocarcinoma.   C. RIGHT HEPATIC DUCT MARGIN:  - Connective tissue with glands showing cautery artifact.  - No diagnostic malignancy identified.   D. GALLBLADDER, CHOLECYSTECTOMY:  - Benign gallbladder.  - Benign cystic duct lymph node.   E. LYMPH NODE, STATION 8, EXCISION:  - Two lymph nodes negative for metastatic carcinoma (0/2).   F. WHIPPLE PROCEDURE:  - Ductal adenocarcinoma, 1.6 cm.  - Carcinoma involves ampulla of Vater and periductal connective tissue.  - Perineural invasion.  - Surgical margins negative for carcinoma.  - Six lymph nodes negative for metastatic carcinoma (0/6).  - See oncology table.   G. LYMPH NODE, PORTAL, EXCISION:  - One lymph  node negative for metastatic carcinoma (0/1).    10/31/2020 Cancer Staging   Staging form: Distal Bile Duct, AJCC 8th Edition - Pathologic stage from 10/31/2020: Stage I (pT1, pN0, cM0) - Signed by [name], MD on 11/23/2020 Total positive nodes:  0 Residual tumor (R): R1 - Microscopic      INTERVAL HISTORY:  [name] [name] is here for a follow up of cholangiocarcinoma. He was last seen by me on 10/10/20 in consultation. He presents to the clinic accompanied by his wife.   He underwent a Whipple surgery on October 31, 2020.  He is recovering well overall.  The surgical wound has healed well without any concerns.  No significant pain, nausea, or other concerns.  His energy has recovered about 80%, he functions well at home.   All other systems were reviewed with the patient and are negative.  MEDICAL HISTORY:  Past Medical History:  Diagnosis Date   Alcoholism ([place])    Anxiety    Bipolar disorder ([place])    Cancer ([place])    COPD (chronic obstructive pulmonary disease) ([place])    Depression    " post traumatic stress disorder" sees MD in [place] every 3-6 months.   Diabetes mellitus without complication ([facility])    Dysrhythmia    hx. Bundle branch block- saw Dr. Kathlee Nations   GERD (gastroesophageal reflux disease)    Hypertension    Irregular heart rate    Sleep apnea    cpap used with nose clip- Dr. Baird Lyons follows   Substance abuse [facility])    "tends to self medicate(Rx. meds or Alcohol) trying to get relief from "PSTD"    SURGICAL HISTORY: Past Surgical History:  Procedure Laterality Date   BILIARY BRUSHING  09/23/2020   Procedure: BILIARY BRUSHING;  Surgeon: Carol Ada, MD;  Location: [facility];  Service: Endoscopy;;   BILIARY STENT PLACEMENT N/A 09/23/2020   Procedure: BILIARY STENT PLACEMENT;  Surgeon: Carol Ada, MD;  Location: [facility];  Service: Endoscopy;  Laterality: N/A;   COLONOSCOPY WITH PROPOFOL N/A 01/19/2014   Procedure: COLONOSCOPY WITH PROPOFOL;  Surgeon: Juanita Craver, MD;  Location: [facility];  Service: Endoscopy;  Laterality: N/A;   ERCP N/A 09/23/2020   Procedure: ENDOSCOPIC RETROGRADE CHOLANGIOPANCREATOGRAPHY (ERCP);  Surgeon: Carol Ada, MD;  Location: [facility];   Service: Endoscopy;  Laterality: N/A;   LAPAROSCOPY N/A 10/31/2020   Procedure: STAGING LAPAROSCOPY;  Surgeon: Dwan Bolt, MD;  Location: [facility];  Service: General;  Laterality: N/A;  GEN AND TAP BLOCK   SPHINCTEROTOMY  09/23/2020   Procedure: Joan Mayans;  Surgeon: Carol Ada, MD;  Location: [facility];  Service: Endoscopy;;   WHIPPLE PROCEDURE N/A 10/31/2020   Procedure: WHIPPLE PROCEDURE, INTRAOPERATIVE ULTRASOUND;  Surgeon: Dwan Bolt, MD;  Location: [facility];  Service: General;  Laterality: N/A;   WISDOM TOOTH EXTRACTION     65 years old    I have reviewed the social history and family history with the patient and they are unchanged from previous note.  ALLERGIES:  has No Known Allergies.  MEDICATIONS:  Current Outpatient Medications  Medication Sig Dispense Refill   capecitabine (XELODA) 500 MG tablet Take 4 tablets (2,000 mg total) by mouth 2 (two) times daily after a meal. Take for 14 days then off 7 days 112 tablet 0   acetaminophen (TYLENOL) 325 MG tablet Take 2 tablets (650 mg total) by mouth every 6 (six) hours as needed for mild pain.     albuterol (VENTOLIN HFA) 108 (90  Base) MCG/ACT inhaler Inhale 2 puffs into the lungs every 6 (six) hours as needed for wheezing or shortness of breath. 8 g 12   Ascorbic Acid (VITAMIN C) 1000 MG tablet Take 1,000 mg by mouth daily.     aspirin EC 81 MG tablet Take 81 mg by mouth daily. Swallow whole.     busPIRone (BUSPAR) 5 MG tablet Take 5 mg by mouth at bedtime.     Cholecalciferol (VITAMIN D) 2000 UNITS tablet Take 2,000 Units by mouth daily.     CHROMIUM PO Take 1 tablet by mouth daily.     clonazePAM (KLONOPIN) 0.5 MG tablet TAKE 1 TABLET BY MOUTH TWICE DAILY AS NEEDED (Patient taking differently: Take 0.5 mg by mouth 2 (two) times daily as needed for anxiety.) 60 tablet 5   cloNIDine (CATAPRES) 0.1 MG tablet Take 0.1 mg by mouth 2 (two) times daily. (Patient not taking: Reported on 10/20/2020)     docusate sodium (COLACE)  100 MG capsule Take 1 capsule (100 mg total) by mouth 2 (two) times daily. 60 capsule 0   doxylamine, Sleep, (UNISOM) 25 MG tablet Take 25 mg by mouth at bedtime as needed for sleep.     ferrous sulfate 324 MG TBEC Take 324 mg by mouth daily with breakfast.     folic acid (FOLVITE) 1 MG tablet Take 1 mg by mouth daily.     magnesium gluconate (MAGONATE) 500 MG tablet Take 500 mg by mouth daily.     metFORMIN (GLUCOPHAGE) 1000 MG tablet TAKE 1 TABLET(1000 MG) BY MOUTH TWICE DAILY WITH A MEAL 180 tablet 3   Multiple Vitamin (MULTIVITAMIN WITH MINERALS) TABS tablet Take 1 tablet by mouth daily.     Naltrexone (VIVITROL) 380 MG SUSR Inject 380 mg into the muscle every 30 (thirty) days. (Patient not taking: Reported on 10/20/2020)     PARoxetine (PAXIL) 30 MG tablet Take 60 mg by mouth at bedtime.     Potassium 99 MG TABS Take 99 mg by mouth daily.     QUEtiapine (SEROQUEL) 50 MG tablet Take 50 mg by mouth at bedtime.     tiaGABine (GABITRIL) 4 MG tablet Take 8 mg by mouth at bedtime.     VASCEPA 1 g capsule TAKE 2 CAPSULES(2 GRAMS) BY MOUTH TWICE DAILY 360 capsule 1   vitamin B-12 (CYANOCOBALAMIN) 500 MCG tablet Take 500 mcg by mouth daily before supper.      No current facility-administered medications for this visit.    PHYSICAL EXAMINATION: ECOG PERFORMANCE STATUS: 1 - Symptomatic but completely ambulatory  Vitals:   11/23/20 1427  BP: (!) 148/98  Pulse: 79  Resp: 18  Temp: 98.4 F (36.9 C)  SpO2: 97%   Wt Readings from Last 3 Encounters:  11/23/20 179 lb (81.2 kg)  11/05/20 175 lb 4.3 oz (79.5 kg)  10/27/20 182 lb (82.6 kg)     GENERAL:alert, no distress and comfortable SKIN: skin color, texture, turgor are normal, no rashes or significant lesions EYES: normal, Conjunctiva are pink and non-injected, sclera clear NECK: supple, thyroid normal size, non-tender, without nodularity LYMPH:  no palpable lymphadenopathy in the cervical, axillary  LUNGS: clear to auscultation and  percussion with normal breathing effort HEART: regular rate & rhythm and no murmurs and no lower extremity edema ABDOMEN:abdomen soft, non-tender and normal bowel sounds.  Midline incision has healed well, no discharge or skin erythema. Musculoskeletal:no cyanosis of digits and no clubbing  NEURO: alert & oriented x 3 with fluent speech, no  focal motor/sensory deficits  LABORATORY DATA:  I have reviewed the data as listed CBC Latest Ref Rng & Units 11/23/2020 11/05/2020 11/04/2020  WBC 4.0 - 10.5 K/uL 9.0 7.4 10.0  Hemoglobin 13.0 - 17.0 g/dL 12.2(L) 10.7(L) 11.5(L)  Hematocrit 39.0 - 52.0 % 36.8(L) 30.5(L) 33.1(L)  Platelets 150 - 400 K/uL 270 159 154     CMP Latest Ref Rng & Units 11/23/2020 11/05/2020 11/04/2020  Glucose 70 - 99 mg/dL 89 102(H) 107(H)  BUN 8 - 23 mg/dL 11 8 7(L)  Creatinine 0.61 - 1.24 mg/dL 0.65 0.52(L) 0.46(L)  Sodium 135 - 145 mmol/L 139 135 136  Potassium 3.5 - 5.1 mmol/L 4.2 3.5 3.6  Chloride 98 - 111 mmol/L 104 103 101  CO2 22 - 32 mmol/L 27 26 26   Calcium 8.9 - 10.3 mg/dL 8.7(L) 8.4(L) 8.6(L)  Total Protein 6.5 - 8.1 g/dL 6.2(L) 4.7(L) 5.0(L)  Total Bilirubin 0.3 - 1.2 mg/dL 0.2(L) 0.6 0.9  Alkaline Phos 38 - 126 U/L 89 85 56  AST 15 - 41 U/L 16 34 21  ALT 0 - 44 U/L 13 40 29      RADIOGRAPHIC STUDIES: I have personally reviewed the radiological images as listed and agreed with the findings in the report. No results found.    No orders of the defined types were placed in this encounter.  All questions were answered. The patient knows to call the clinic with any problems, questions or concerns. No barriers to learning was detected. The total time spent in the appointment was 40 minutes.     [name], MD 11/23/2020   I, Wilburn Mylar, am acting as scribe for [name], MD.   I have reviewed the above documentation for accuracy and completeness, and I agree with the above.

## 2020-11-23 NOTE — Progress Notes (Signed)
We also discussed dietary changes to decrease intestinal gas and cramping.

## 2020-11-23 NOTE — Progress Notes (Signed)
I met with Mr and Mrs Keziah after his appt with Dr Burr Medico today.  I reviewed my role.  I reviewed Xeloda side effects: diarrhea, hand/foot syndrome/mouth sores.  I explained that our oral pharmacist will reach out to the regarding the Xeloda.  All questions were answered.  They verbalized understanding.

## 2020-11-23 NOTE — Telephone Encounter (Addendum)
Oral Oncology Pharmacist Encounter  Received new prescription for Xeloda (capecitabine) for the adjuvant treatment of extrahepatic cholangiocarcinoma, planned duration ~8 cycles.  CBC w/ Diff and CMP from 11/23/20 assessed, no relevant lab abnormalities noted. Prescription dose and frequency assessed.  Current medication list in Epic reviewed, DDIs with Xeloda identified: Category C DDI between Xeloda and folic acid - folic acid may increase risk of ADEs while on Xeloda. If able, would recommend patient consider holding folic acid while on Xeloda to decrease risk of ADEs.  Category C DDI between Xeloda and Quetiapine due to risk of Qtc prolongation. Last EKG in chart from 10/27/20, Qtc 459 ms. No change in therapy warranted at this time.   Evaluated chart and no patient barriers to medication adherence noted.   Patient agreement for treatment documented in MD note on 11/23/20.  Patient required to fill through [facility]. Prescription redirected for dispensing.   Oral Oncology Clinic will continue to follow for insurance authorization, copayment issues, initial counseling and start date.  Leron Croak, PharmD, BCPS Hematology/Oncology Clinical Pharmacist [facility] [phone] 11/23/2020 3:44 PM

## 2020-11-23 NOTE — Telephone Encounter (Signed)
Oral Oncology Patient Advocate Encounter  After completing a benefits investigation, prior authorization for Xeloda is not required at this time through [facility].  Patient's copay is $85 for one fill only, then must fill at [facility].  [place] [facility] [place] Phone [phone] Fax [phone] 11/23/2020 3:18 PM

## 2020-11-24 ENCOUNTER — Other Ambulatory Visit (HOSPITAL_COMMUNITY): Payer: Self-pay

## 2020-11-24 ENCOUNTER — Ambulatory Visit
Admission: RE | Admit: 2020-11-24 | Discharge: 2020-11-24 | Disposition: A | Discharge status: Home / Self Care | Payer: Federal, State, Local not specified - PPO | Source: Ambulatory Visit | Attending: Radiation Oncology | Admitting: Radiation Oncology

## 2020-11-24 ENCOUNTER — Encounter: Payer: Self-pay | Admitting: Radiation Oncology

## 2020-11-24 VITALS — BP 147/90 | HR 74 | Temp 97.3°F | Resp 20 | Ht 70.0 in | Wt 178.6 lb

## 2020-11-24 DIAGNOSIS — F4310 Post-traumatic stress disorder, unspecified: Secondary | ICD-10-CM | POA: Diagnosis not present

## 2020-11-24 DIAGNOSIS — C221 Intrahepatic bile duct carcinoma: Secondary | ICD-10-CM

## 2020-11-24 DIAGNOSIS — Z7982 Long term (current) use of aspirin: Secondary | ICD-10-CM | POA: Insufficient documentation

## 2020-11-24 DIAGNOSIS — E1165 Type 2 diabetes mellitus with hyperglycemia: Secondary | ICD-10-CM | POA: Diagnosis not present

## 2020-11-24 DIAGNOSIS — F1011 Alcohol abuse, in remission: Secondary | ICD-10-CM | POA: Diagnosis not present

## 2020-11-24 DIAGNOSIS — G4730 Sleep apnea, unspecified: Secondary | ICD-10-CM | POA: Insufficient documentation

## 2020-11-24 DIAGNOSIS — K219 Gastro-esophageal reflux disease without esophagitis: Secondary | ICD-10-CM | POA: Diagnosis not present

## 2020-11-24 DIAGNOSIS — Z7984 Long term (current) use of oral hypoglycemic drugs: Secondary | ICD-10-CM | POA: Diagnosis not present

## 2020-11-24 DIAGNOSIS — I10 Essential (primary) hypertension: Secondary | ICD-10-CM | POA: Diagnosis not present

## 2020-11-24 DIAGNOSIS — E119 Type 2 diabetes mellitus without complications: Secondary | ICD-10-CM | POA: Insufficient documentation

## 2020-11-24 DIAGNOSIS — C240 Malignant neoplasm of extrahepatic bile duct: Secondary | ICD-10-CM

## 2020-11-24 DIAGNOSIS — F319 Bipolar disorder, unspecified: Secondary | ICD-10-CM | POA: Insufficient documentation

## 2020-11-24 DIAGNOSIS — J449 Chronic obstructive pulmonary disease, unspecified: Secondary | ICD-10-CM | POA: Diagnosis not present

## 2020-11-24 DIAGNOSIS — F17210 Nicotine dependence, cigarettes, uncomplicated: Secondary | ICD-10-CM | POA: Diagnosis not present

## 2020-11-24 LAB — CANCER ANTIGEN 19-9: CA 19-9: 16 U/mL (ref 0–35)

## 2020-11-24 MED ORDER — CAPECITABINE 500 MG PO TABS: 1000.0000 mg/m2 | ORAL_TABLET | Freq: Two times a day (BID) | ORAL | 0 refills | Status: DC

## 2020-11-24 NOTE — Telephone Encounter (Signed)
Oral Chemotherapy Pharmacist Encounter   Spoke with patient's wife today to follow up regarding patient's oral chemotherapy medication: Xeloda (capecitabine)  Discussed that Xeloda will be dispensed from [facility] since they are contracted with the patient's insurance. Wife stated that they are going out of town this weekend and having medication delivered early next week would be best. Our clinic will follow up regarding prescription status with [facility] on 10/7 and update family with plans at that time.  Patient and family know to call the office with questions or concerns.  Leron Croak, PharmD, BCPS Hematology/Oncology Clinical Pharmacist [facility] [phone] 11/24/2020 2:29 PM

## 2020-11-24 NOTE — Progress Notes (Signed)
Patient reports mildly uncomfortable abdominal pressure, relieved by the release of flatulence. No other symptoms reported at this time.  Meaningful use complete.  BP (!) 147/90 (BP Location: Left Arm, Patient Position: Sitting)   Pulse 74   Temp (!) 97.3 F (36.3 C) (Temporal)   Resp 20   Ht 5\' 10"  (1.778 m)   Wt 178 lb 9.6 oz (81 kg)   SpO2 99%   BMI 25.63 kg/m

## 2020-11-24 NOTE — Progress Notes (Signed)
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   HISTORY OF PRESENT ILLNESS: [name] [name] is a 65 y.o. male seen at with a newly diagnosed biliary carcinoma.  The patient presented with abdominal pain, nausea and diarrhea and jaundice. An ultrasound of the right upper quadrant revealed an intrahepatic biliary ductal dilatation and proximal common bile duct dilatation CT abdomen pelvis on 09/22/2020 the same day showed questionable bibasilar pulmonary fibrosis and homogeneous hyperdensity filling the gallbladder lumen with intra and extrahepatic biliary ductal dilatation no dilation of the pancreatic duct was identified.  Nonspecific perirectal fat stranding with under distention of the rectum and atherosclerotic disease of the aorta were also seen.  An MRCP of the abdomen on 09/23/2020 showed obstructing soft tissue mass in the proximal common bile duct at or adjacent to the junction with the cystic duct resulting in moderate to severe intrahepatic biliary ductal dilatation and biliary sludge filling the gallbladder lumen.  The soft tissue density was estimated at 1.2 x 1.8 cm in the distal common bile duct was completely decompressed.  No filling defects were noted in the distal common bile duct he underwent ERCP with Dr. Benson Norway and images revealed irregular biliary stricture involving the proximal common bile duct region at the time of the procedure Dr. Benson Norway described deeply cannulating the bile duct with brisk flow of contrast through the ducts but contrast extended into the hepatic ducts and the common bile duct contained a single local stenosis  that was 10 mm in length the common biliary tree and right and left hepatic ducts were also moderately dilated and diffusely dilated with a mass causing an obstruction a guidewire was passed into the biliary tree and sphincterotomy was made cytology was submitted in a 7 [place] by 7 cm plastic biliary stent was left in place with good biliary flow through the stent at the conclusion of the previous treatment.  Final cytology from the specimen was consistent with an adenocarcinoma.  He proceeded with additional imaging with PET scan on 10/10/2020 which revealed no evidence of metastatic disease, visualized the common bile duct mass that was seen on MRI, and in situ ductal stent was also present with persistent biliary ductal dilatation and pneumobilia stigmata of a left renal stone bladder wall thickening atherosclerotic and coronary calcifications as well as emphysema were seen.  Triple phase CT abdomen pelvis was also performed and showed no suspicious liver lesions the replaced right hepatic artery arising from the SMA the visible common bile duct tumor appearing to be unchanged with an in situ stent and fibrotic interstitial lung disease.    He was offered surgical resection and underwent staging laparoscopy Whipple procedure with intraoperative ultrasound on 10/31/2020, final pathology revealed a benign gallbladder and benign cystic duct lymph node, within the main specimen ductal adenocarcinoma involving the ampulla of vader and periductal connective tissue with perineural invasion was noted measuring up to 1.6 cm.  Surgical margins of the specimen were negative, 6 sampled lymph nodes in this resection were negative, and additional specimen of a lymph node of the portal region was negative for disease, 2  additional lymph nodes in station 8 were negative for metastatic disease, connective tissue with glands showing cautery artifact were noted within the right hepatic ductal margin, the new hepatic duct margin  showed atypical glands consistent with adenocarcinoma and the common hepatic duct margin showed adenocarcinoma involving the bile duct.  His case was discussed in multidisciplinary oncology conference.  Given the findings of positive margins he is planning adjuvant Xeloda with Dr. Burr Medico, and is seen again today to discuss adjuvant radiotherapy to the surgical bed.    PREVIOUS RADIATION THERAPY: No   PAST MEDICAL HISTORY:  Past Medical History:  Diagnosis Date   Alcoholism ([place])    Anxiety    Bipolar disorder ([place])    Cancer ([place])    COPD (chronic obstructive pulmonary disease) ([place])    Depression    " post traumatic stress disorder" sees MD in [place] every 3-6 months.   Diabetes mellitus without complication (HCC)    Dysrhythmia    hx. Bundle branch block- saw Dr. Kathlee Nations   GERD (gastroesophageal reflux disease)    Hypertension    Irregular heart rate    Sleep apnea    cpap used with nose clip- Dr. Baird Lyons follows   Substance abuse [facility])    "tends to self medicate(Rx. meds or Alcohol) trying to get relief from "PSTD"       PAST SURGICAL HISTORY: Past Surgical History:  Procedure Laterality Date   BILIARY BRUSHING  09/23/2020   Procedure: BILIARY BRUSHING;  Surgeon: Carol Ada, MD;  Location: [facility];  Service: Endoscopy;;   BILIARY STENT PLACEMENT N/A 09/23/2020   Procedure: BILIARY STENT PLACEMENT;  Surgeon: Carol Ada, MD;  Location: [facility];  Service: Endoscopy;  Laterality: N/A;   COLONOSCOPY WITH PROPOFOL N/A 01/19/2014   Procedure: COLONOSCOPY WITH PROPOFOL;  Surgeon: Juanita Craver, MD;  Location: [facility];  Service: Endoscopy;  Laterality: N/A;   ERCP N/A 09/23/2020   Procedure: ENDOSCOPIC RETROGRADE CHOLANGIOPANCREATOGRAPHY (ERCP);  Surgeon: Carol Ada, MD;  Location: [facility];  Service: Endoscopy;  Laterality: N/A;   LAPAROSCOPY N/A 10/31/2020   Procedure: STAGING LAPAROSCOPY;  Surgeon: Dwan Bolt, MD;  Location: [place];  Service: General;  Laterality: N/A;  GEN AND TAP BLOCK   SPHINCTEROTOMY  09/23/2020   Procedure: Joan Mayans;  Surgeon: Carol Ada, MD;  Location: [facility];  Service: Endoscopy;;   WHIPPLE PROCEDURE N/A 10/31/2020   Procedure: WHIPPLE PROCEDURE, INTRAOPERATIVE ULTRASOUND;  Surgeon: Dwan Bolt, MD;  Location: [place];  Service: General;  Laterality: N/A;   WISDOM TOOTH EXTRACTION     65 years old     FAMILY HISTORY:  Family History  Problem Relation Age of Onset   Diabetes Father    Heart Problems Father    Heart disease Father    Mental illness Father      SOCIAL HISTORY:  reports that he has been smoking cigarettes. He started smoking about 3 years ago. He has a 45.00 pack-year smoking history. He has never used smokeless tobacco. He reports that he does not currently use alcohol after a past usage of about 42.0 standard drinks per week. He reports that he does not use drugs.  The patient is married and accompanied by his wife, they live in [place].  They have a young granddaughter whom they care for often. He is active in his [place] group.    ALLERGIES: Patient has no known allergies.   MEDICATIONS:  Current Outpatient Medications  Medication Sig Dispense Refill  acetaminophen (TYLENOL) 325 MG tablet Take 2 tablets (650 mg total) by mouth every 6 (six) hours as needed for mild pain.     albuterol (VENTOLIN HFA) 108 (90 Base) MCG/ACT inhaler Inhale 2 puffs into the lungs every 6 (six) hours as needed for wheezing or shortness of breath. 8 g 12   Ascorbic Acid (VITAMIN C) 1000 MG tablet Take 1,000 mg by mouth daily.     aspirin EC 81 MG tablet Take 81 mg by mouth daily. Swallow whole.     busPIRone (BUSPAR) 5 MG tablet Take 5 mg by mouth at bedtime.     capecitabine (XELODA) 500 MG tablet Take 4 tablets (2,000 mg total) by mouth 2 (two) times daily after a meal. Take for 14 days then off 7 days 112 tablet 0   Cholecalciferol (VITAMIN D) 2000 UNITS tablet Take 2,000  Units by mouth daily.     CHROMIUM PO Take 1 tablet by mouth daily.     clonazePAM (KLONOPIN) 0.5 MG tablet TAKE 1 TABLET BY MOUTH TWICE DAILY AS NEEDED (Patient taking differently: Take 0.5 mg by mouth 2 (two) times daily as needed for anxiety.) 60 tablet 5   cloNIDine (CATAPRES) 0.1 MG tablet Take 0.1 mg by mouth 2 (two) times daily. (Patient not taking: Reported on 10/20/2020)     docusate sodium (COLACE) 100 MG capsule Take 1 capsule (100 mg total) by mouth 2 (two) times daily. 60 capsule 0   doxylamine, Sleep, (UNISOM) 25 MG tablet Take 25 mg by mouth at bedtime as needed for sleep.     ferrous sulfate 324 MG TBEC Take 324 mg by mouth daily with breakfast.     folic acid (FOLVITE) 1 MG tablet Take 1 mg by mouth daily.     magnesium gluconate (MAGONATE) 500 MG tablet Take 500 mg by mouth daily.     metFORMIN (GLUCOPHAGE) 1000 MG tablet TAKE 1 TABLET(1000 MG) BY MOUTH TWICE DAILY WITH A MEAL 180 tablet 3   Multiple Vitamin (MULTIVITAMIN WITH MINERALS) TABS tablet Take 1 tablet by mouth daily.     Naltrexone (VIVITROL) 380 MG SUSR Inject 380 mg into the muscle every 30 (thirty) days. (Patient not taking: Reported on 10/20/2020)     PARoxetine (PAXIL) 30 MG tablet Take 60 mg by mouth at bedtime.     Potassium 99 MG TABS Take 99 mg by mouth daily.     QUEtiapine (SEROQUEL) 50 MG tablet Take 50 mg by mouth at bedtime.     tiaGABine (GABITRIL) 4 MG tablet Take 8 mg by mouth at bedtime.     VASCEPA 1 g capsule TAKE 2 CAPSULES(2 GRAMS) BY MOUTH TWICE DAILY 360 capsule 1   vitamin B-12 (CYANOCOBALAMIN) 500 MCG tablet Take 500 mcg by mouth daily before supper.      No current facility-administered medications for this encounter.     REVIEW OF SYSTEMS: On review of systems, the patient reports that he is doing well. He has had gassiness and belching frequently from surgery. His bowels are moving more regularly, and he denies nausea, vomiting, fevers or chills. No other complaints are verbalized.       PHYSICAL EXAM:  Wt Readings from Last 3 Encounters:  11/24/20 178 lb 9.6 oz (81 kg)  11/23/20 179 lb (81.2 kg)  11/05/20 175 lb 4.3 oz (79.5 kg)   Temp Readings from Last 3 Encounters:  11/24/20 (!) 97.3 F (36.3 C) (Temporal)  11/23/20 98.4 F (36.9 C) (Oral)  11/07/20 98.5 F (  36.9 C) (Oral)   BP Readings from Last 3 Encounters:  11/24/20 (!) 147/90  11/23/20 (!) 148/98  11/07/20 (!) 142/74   Pulse Readings from Last 3 Encounters:  11/24/20 74  11/23/20 79  11/07/20 91   Pain Assessment Pain Score: 0-No pain/10  In general this is a well appearing Caucasian male in no acute distress.  He's alert and oriented x4 and appropriate throughout the examination. Cardiopulmonary assessment is negative for acute distress and he exhibits normal effort.  His abdominal exam reveals well-healing port site incisions as well as a midline laparatomy with mild induration, without cellulitic streaking bleeding or drainage.  The abdomen is soft nontender nondistended but moderately protuberant.    ECOG = 1  0 - Asymptomatic (Fully active, able to carry on all predisease activities without restriction)  1 - Symptomatic but completely ambulatory (Restricted in physically strenuous activity but ambulatory and able to carry out work of a light or sedentary nature. For example, light housework, office work)  2 - Symptomatic, <50% in bed during the day (Ambulatory and capable of all self care but unable to carry out any work activities. Up and about more than 50% of waking hours)  3 - Symptomatic, >50% in bed, but not bedbound (Capable of only limited self-care, confined to bed or chair 50% or more of waking hours)  4 - Bedbound (Completely disabled. Cannot carry on any self-care. Totally confined to bed or chair)  5 - Death   Eustace Pen MM, Creech RH, Tormey DC, et al. [phone]). "Toxicity and response criteria of the [facility]". [place] Oncol. 5 (6):  649-55    LABORATORY DATA:  Lab Results  Component Value Date   WBC 9.0 11/23/2020   HGB 12.2 (L) 11/23/2020   HCT 36.8 (L) 11/23/2020   MCV 97.6 11/23/2020   PLT 270 11/23/2020   Lab Results  Component Value Date   NA 139 11/23/2020   K 4.2 11/23/2020   CL 104 11/23/2020   CO2 27 11/23/2020   Lab Results  Component Value Date   ALT 13 11/23/2020   AST 16 11/23/2020   ALKPHOS 89 11/23/2020   BILITOT 0.2 (L) 11/23/2020      RADIOGRAPHY: No results found.     IMPRESSION/PLAN: 1. 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 Dr. Lisbeth Renshaw has reviewed the patients case and today we discussed the pathology findings and reviews the nature of cholangiocarcinoma. Dr. Lisbeth Renshaw would recommend adjuvant radiotherapy to the surgical bed to reduce risks of local recurrence. He recommends a course of therapy over 5 1/2 weeks. He will also continue sensitizing Xeloda. We discussed the risks, benefits, short, and long term effects of radiotherapy, as well as the curative  intent of treatment . Written consent is obtained and placed in the chart, a copy was provided to the patient. He will receive a call from our department to coordinate simulation at the end fo December 2022. We anticipate starting therapy about 3 months from now after completing 3 cycles of Xeloda.  2. Hypertension and hyperglycemia. He will follow up with his PCP and Dr. Zenia Resides to see if he needs to increase his medications again.   In a visit lasting 60 minutes, greater than 50% of the time was spent face to face discussing the patient's condition, in preparation for the discussion, and coordinating the patient's care.   The above documentation reflects my direct findings during this shared patient visit. Please see the separate  note by Dr. Lisbeth Renshaw on this date for the remainder of the patient's plan of care.    Carola Rhine, [facility]   **Disclaimer: This note was dictated with voice recognition software.  Similar sounding words can inadvertently be transcribed and this note may contain transcription errors which may not have been corrected upon publication of note.**

## 2020-11-25 NOTE — Telephone Encounter (Signed)
Oral Chemotherapy Pharmacist Encounter   Followed up with [facility] regarding status of Xeloda (capecitabine) for [name] [name]. Medication is ready to be set up for shipment from the pharmacy, with earliest estimated delivery to patient's home on 11/30/20.  I called and followed up with patient's wife today regarding the status of the medication. She is aware that they will have to call and set up shipment of Xeloda. Wife stated that they have contact information to call [facility] pharmacy for medication delivery. Discussed that since tentative delivery date of medication will be 11/30/20, start date of Xeloda (capecitabine) will likely be 12/05/20  Will call patient and wife for formal education on 11/30/20 and confirm Xeloda start date at that time.   Patient and family know to call the office with questions or concerns.  Leron Croak, PharmD, BCPS Hematology/Oncology Clinical Pharmacist [facility] [phone] 11/25/2020 10:07 AM

## 2020-11-28 ENCOUNTER — Other Ambulatory Visit: Payer: Self-pay | Admitting: Hematology

## 2020-11-28 DIAGNOSIS — C221 Intrahepatic bile duct carcinoma: Secondary | ICD-10-CM

## 2020-11-29 ENCOUNTER — Telehealth: Payer: Self-pay | Admitting: Hematology

## 2020-11-29 NOTE — Telephone Encounter (Signed)
I called pt to check if he has received Xeloda. I spoke with his wife. Xeloda will be delivered tomorrow and he plans to start on Monday 10/17.  I reviewed potential side effect of Xeloda, and schedule with her again.  She voiced good understanding, and knows to call me if he experiences significant side effects.  I will schedule his follow-up with me on 10/31 or 11/2, schedule message sent.  [name]  11/29/2020

## 2020-11-30 NOTE — Telephone Encounter (Signed)
Oral Chemotherapy Pharmacist Encounter  I spoke with patient and patient's wife for overview of: Xeloda (capecitabine) for the adjuvant treatment of extrahepatic cholangiocarcinoma cancer, planned duration ~8 cycles.  Counseled patient on administration, dosing, side effects, monitoring, drug-food interactions, safe handling, storage, and disposal.  Patient will take Xeloda 500mg  tablets, 4 tablets (2000mg ) by mouth in AM and 4 tabs (2000mg ) by mouth in PM, within 30 minutes of finishing meals, for 14 days on, 7 days off, repeated every 21 days.  Xeloda start date: 12/05/20  Adverse effects include but are not limited to: fatigue, decreased blood counts, GI upset, diarrhea, mouth sores, and hand-foot syndrome.  Patient will obtain anti diarrheal and alert the office of 4 or more loose stools above baseline.  Reviewed with patient importance of keeping a medication schedule and plan for any missed doses. No barriers to medication adherence identified.  Medication reconciliation performed and medication/allergy list updated. Patient will HOLD folic acid while on Xeloda due to risk of increased ADEs from Xeloda with concomitant use with folic acid.   All questions answered.  Mr. And Ms. Westrich voiced understanding and appreciation.   Medication education handout placed in mail for patient. Patient knows to call the office with questions or concerns. Oral Chemotherapy Clinic phone number provided to patient.   Leron Croak, PharmD, BCPS Hematology/Oncology Clinical Pharmacist [facility] [place] [phone] 11/30/2020 10:48 AM

## 2020-12-02 ENCOUNTER — Telehealth: Payer: Self-pay | Admitting: Hematology

## 2020-12-02 NOTE — Progress Notes (Signed)
I spoke with [name] [name],  Mr Steeves received his capecitabine and he will beginning taking it on Monday 12/05/2020.  I instructed [name] Burget if he should experience side effects, diarrhea, n/v, hand foot syndrome, and mouth sores to let us know.  She verbalized understanding.  Scheduling message sent for lab and f/u with YF in 2.5 weeks form 12/05/2020.

## 2020-12-02 NOTE — Telephone Encounter (Signed)
Scheduled per 10/14 in basket, pt has been called and confirmed appt 

## 2020-12-08 ENCOUNTER — Telehealth: Payer: Self-pay | Admitting: *Deleted

## 2020-12-08 ENCOUNTER — Other Ambulatory Visit: Payer: Self-pay | Admitting: Internal Medicine

## 2020-12-08 NOTE — Telephone Encounter (Signed)
[name] [name] states he started taking Xeloda on Monday. Has developed tingling in his mouth, on tongue and in corners of mouth. Wants to know if he should start using the mouthwash that has been mentioned.

## 2020-12-20 ENCOUNTER — Inpatient Hospital Stay: Payer: Federal, State, Local not specified - PPO | Attending: Hematology

## 2020-12-20 ENCOUNTER — Inpatient Hospital Stay: Payer: Federal, State, Local not specified - PPO | Admitting: Hematology

## 2020-12-20 ENCOUNTER — Other Ambulatory Visit: Payer: Self-pay

## 2020-12-20 VITALS — BP 156/95 | HR 73 | Temp 98.4°F | Resp 17 | Wt 179.7 lb

## 2020-12-20 DIAGNOSIS — I10 Essential (primary) hypertension: Secondary | ICD-10-CM | POA: Diagnosis not present

## 2020-12-20 DIAGNOSIS — R040 Epistaxis: Secondary | ICD-10-CM | POA: Insufficient documentation

## 2020-12-20 DIAGNOSIS — C221 Intrahepatic bile duct carcinoma: Secondary | ICD-10-CM | POA: Insufficient documentation

## 2020-12-20 DIAGNOSIS — E119 Type 2 diabetes mellitus without complications: Secondary | ICD-10-CM | POA: Insufficient documentation

## 2020-12-20 DIAGNOSIS — F17210 Nicotine dependence, cigarettes, uncomplicated: Secondary | ICD-10-CM | POA: Insufficient documentation

## 2020-12-20 LAB — CBC WITH DIFFERENTIAL (CANCER CENTER ONLY)
Abs Immature Granulocytes: 0.01 10*3/uL (ref 0.00–0.07)
Basophils Absolute: 0 10*3/uL (ref 0.0–0.1)
Basophils Relative: 1 %
Eosinophils Absolute: 0.2 10*3/uL (ref 0.0–0.5)
Eosinophils Relative: 3 %
HCT: 37.5 % — ABNORMAL LOW (ref 39.0–52.0)
Hemoglobin: 12.7 g/dL — ABNORMAL LOW (ref 13.0–17.0)
Immature Granulocytes: 0 %
Lymphocytes Relative: 43 %
Lymphs Abs: 2.5 10*3/uL (ref 0.7–4.0)
MCH: 32.1 pg (ref 26.0–34.0)
MCHC: 33.9 g/dL (ref 30.0–36.0)
MCV: 94.7 fL (ref 80.0–100.0)
Monocytes Absolute: 0.4 10*3/uL (ref 0.1–1.0)
Monocytes Relative: 6 %
Neutro Abs: 2.8 10*3/uL (ref 1.7–7.7)
Neutrophils Relative %: 47 %
Platelet Count: 210 10*3/uL (ref 150–400)
RBC: 3.96 MIL/uL — ABNORMAL LOW (ref 4.22–5.81)
RDW: 12.6 % (ref 11.5–15.5)
WBC Count: 5.8 10*3/uL (ref 4.0–10.5)
nRBC: 0 % (ref 0.0–0.2)

## 2020-12-20 LAB — CMP (CANCER CENTER ONLY)
ALT: 13 U/L (ref 0–44)
AST: 16 U/L (ref 15–41)
Albumin: 3.8 g/dL (ref 3.5–5.0)
Alkaline Phosphatase: 61 U/L (ref 38–126)
Anion gap: 8 (ref 5–15)
BUN: 12 mg/dL (ref 8–23)
CO2: 28 mmol/L (ref 22–32)
Calcium: 8.7 mg/dL — ABNORMAL LOW (ref 8.9–10.3)
Chloride: 105 mmol/L (ref 98–111)
Creatinine: 0.67 mg/dL (ref 0.61–1.24)
GFR, Estimated: >60 mL/min (ref >60)
Glucose, Bld: 109 mg/dL — ABNORMAL HIGH (ref 70–99)
Potassium: 3.8 mmol/L (ref 3.5–5.1)
Sodium: 141 mmol/L (ref 135–145)
Total Bilirubin: 0.3 mg/dL (ref 0.3–1.2)
Total Protein: 6.6 g/dL (ref 6.5–8.1)

## 2020-12-20 NOTE — Progress Notes (Signed)
[place]   [phone] [phone]   Clinic Follow up Note   Patient Care Team: Hoyt Koch, MD as PCP - General (Internal Medicine) Deneise Lever, MD as Consulting Physician (Pulmonary Disease) Dwan Bolt, MD as Consulting Physician (General Surgery) Kyung Rudd, MD as Consulting Physician (Radiation Oncology) [name], MD as Consulting Physician (Hematology) [name], MD as Consulting Physician (Hematology) Carol Ada, MD as Consulting Physician (Gastroenterology)  Date of Service:  12/20/2020  CHIEF COMPLAINT: f/u of extrahepatic cholangiocarcinoma  CURRENT THERAPY:  Adjuvant Xeloda $RemoveBeforeD'2000mg'[id number]$  q12h on day 1-14 every 21 days, started 12/05/20  ASSESSMENT & PLAN:  [name] [name] is a 65 y.o. male with   1.  Extrahepatic cholangiocarcinoma, pT1cN0M0, stage I -presented to ED on 09/22/20 with jaundice and abdominal tenderness. He was found to have bilirubin of 15   -Abdomen MRI on 09/23/20 revealed obstructing soft tissue mass in proximal common bile duct. ERCP with bile duct brushing on 09/23/20 under Dr. Benson Norway confirmed adenocarcinoma.  He is status post CBD stent placement -PET scan and CT AP performed earlier today 10/10/20 showed no evidence of nodal or metastatic disease. -baseline CEA and CA 19-9 were normal. -he proceeded to whipple procedure on 10/31/20 under Dr. Zenia Resides. Pathology showed: ductal adenocarcinoma, 1.6 cm, involving ampulla of Vater and periductal connective tissue; 9 negative lymph nodes (0/9). Hepatic duct margin was positive.  -He began adjuvant Xeloda $RemoveBeforeD'1000mg'EdKaIRDeJvqIkF$ /m2 q12h for day 1-14 evey 21 days on 12/05/20. Plan to give for 6 months, per BILCAP trial data.  -He met with Dr. Ida Rogue PA, [facility], on 11/24/20. They plan to initiate radiation after he has completed 3 cycles of Xeloda.   2.  History of alcohol addiction, heavy smoking  -He was alcoholic, but has stopped completely.  He is still seeing psychiatrist but no  longer requires medication for cravings.  -he is a heavy smoker.  Has tried to switch to vaping but ultimately returned to smoking -I again discussed smoking cessation, he will try      PLAN:  -continue Xeloda, start cycle 2 on 11/7  -lab and f/u in 3 weeks   No problem-specific Assessment & Plan notes found for this encounter.   SUMMARY OF ONCOLOGIC HISTORY: Oncology History Overview Note  Cancer Staging Cholangiocarcinoma [facility]) Staging form: Distal Bile Duct, AJCC 8th Edition - Pathologic stage from 10/31/2020: Stage I (pT1, pN0, cM0) - Signed by [name], MD on 11/22/2020    Cholangiocarcinoma ([place])  09/22/2020 Imaging   US Abdomen  IMPRESSION: Intrahepatic biliary ductal dilation and dilated proximal common bile duct. Findings are consistent with biliary obstruction, possibly due to a common bile duct mass, incompletely evaluated by ultrasound. Recommend multiphase CT or MRCP for further evaluation.   Mildly distended gallbladder filled with material of varying echogenicity, likely sludge and tiny stones. In the absence of leukocytosis and right upper quadrant pain this is unlikely to represent cholecystitis.   Coarsened liver echogenicity with somewhat nodular left hepatic lobe, compatible with chronic liver disease.   09/22/2020 Imaging   CT AP  IMPRESSION: 1. Question bibasilar pulmonary fibrosis. 2. Homogeneous hyperdensity fills the gallbladder lumen - likely represents gallbladder sludge. Intra and extrahepatic biliary ductal dilatation with no main pancreatic duct dilatation. Findings are consistent with biliary obstruction, possibly due to a common bile duct mass which is incompletely evaluated on this single phase portal venous study. Recommend MRCP for further evaluation. 3. Nonspecific perirectal fat stranding with under distension of the rectum.  4. Aortic Atherosclerosis (ICD10-I70.0).   09/23/2020 Imaging   MRI Abdomen MRCP  IMPRESSION: 1.  Obstructing soft tissue mass in the proximal common bile duct at or adjacent to the junction with the cystic duct, associated with moderate to severe intrahepatic biliary ductal dilatation indicating obstruction, as discussed above. 2. Biliary sludge filling the gallbladder lumen.   09/23/2020 Procedure   ERCP, Dr. Benson Norway  Impression: - The major papilla appeared normal. - A single localized biliary stricture was found in the common bile duct. The stricture was malignant appearing. - The left and right hepatic ducts and all intrahepatic branches and common bile duct were moderately dilated, with a mass causing an obstruction. - A biliary sphincterotomy was performed. - Cells for cytology obtained in the upper third of the main bile duct. - One plastic biliary stent was placed into the common bile duct.   09/23/2020 Pathology Results   A. BILIARY, BRUSHING:   FINAL MICROSCOPIC DIAGNOSIS:  - Malignant cells consistent with adenocarcinoma    10/09/2020 Initial Diagnosis   Cholangiocarcinoma ([place])   10/10/2020 PET scan   IMPRESSION: 1. No evidence of metastatic disease. Common bile duct mass, better seen and described on MR abdomen 09/23/2020. 2. Common bile duct stent in place with persistent biliary ductal dilatation and associated pneumobilia. 3. Tiny left renal stone. 4. Bladder wall thickening. 5. Aortic atherosclerosis (ICD10-I70.0). Coronary artery calcification. 6.  Emphysema (ICD10-J43.9).   10/10/2020 Imaging   CT AP  IMPRESSION: No suspicious liver lesions. Replaced right hepatic artery arising from the SMA.   Common bile duct tumor appears unchanged compared to prior CT. Common bile duct stent place.   Findings of fibrotic interstitial lung disease in the partially visualized lower chest, could be further evaluated with dedicated high-resolution chest CT.   10/31/2020 Cancer Staging   Staging form: Distal Bile Duct, AJCC 8th Edition - Pathologic stage from 10/31/2020:  Stage I (pT1, pN0, cM0) - Signed by [name], MD on 11/22/2020 Stage prefix: Initial diagnosis Total positive nodes: 0 Residual tumor (R): R1 - Microscopic    10/31/2020 Surgery   Whipple Procedure, Dr. Michaelle Birks   10/31/2020 Pathology Results   FINAL MICROSCOPIC DIAGNOSIS:   A. COMMON HEPATIC DUCT MARGIN:  - Adenocarcinoma involving bile duct.   B. NEW COMMON HEPATIC DUCT MARGIN:  - Atypical glands consistent with adenocarcinoma.   C. RIGHT HEPATIC DUCT MARGIN:  - Connective tissue with glands showing cautery artifact.  - No diagnostic malignancy identified.   D. GALLBLADDER, CHOLECYSTECTOMY:  - Benign gallbladder.  - Benign cystic duct lymph node.   E. LYMPH NODE, STATION 8, EXCISION:  - Two lymph nodes negative for metastatic carcinoma (0/2).   F. WHIPPLE PROCEDURE:  - Ductal adenocarcinoma, 1.6 cm.  - Carcinoma involves ampulla of Vater and periductal connective tissue.  - Perineural invasion.  - Surgical margins negative for carcinoma.  - Six lymph nodes negative for metastatic carcinoma (0/6).  - See oncology table.   G. LYMPH NODE, PORTAL, EXCISION:  - One lymph node negative for metastatic carcinoma (0/1).    10/31/2020 Cancer Staging   Staging form: Distal Bile Duct, AJCC 8th Edition - Pathologic stage from 10/31/2020: Stage I (pT1, pN0, cM0) - Signed by [name], MD on 11/23/2020 Total positive nodes: 0 Residual tumor (R): R1 - Microscopic       INTERVAL HISTORY:  [name] [name] is here for a follow up of extrahepatic cholangiocarcinoma. He was last seen by me on 11/23/20. He presents to the  clinic accompanied by his wife. He reports he is tolerating Xeloda well with only some mild mouth sores and dry lips. He denies fatigue, nausea. He notes he still has some gas, but it is much improved since surgery. He notes he has been sober for a year now and no longer requires medication to deter his cravings.   All other systems were reviewed with the patient  and are negative.  MEDICAL HISTORY:  Past Medical History:  Diagnosis Date   Alcoholism ([place])    Anxiety    Bipolar disorder ([place])    Cancer ([place])    COPD (chronic obstructive pulmonary disease) ([place])    Depression    " post traumatic stress disorder" sees MD in [place] every 3-6 months.   Diabetes mellitus without complication (H[facility])    Dysrhythmia    hx. Bundle branch block- saw Dr. Kathlee Nations   GERD (gastroesophageal reflux disease)    Hypertension    Irregular heart rate    Sleep apnea    cpap used with nose clip- Dr. Baird Lyons follows   Substance abuse [facility])    "tends to self medicate(Rx. meds or Alcohol) trying to get relief from "PSTD"    SURGICAL HISTORY: Past Surgical History:  Procedure Laterality Date   BILIARY BRUSHING  09/23/2020   Procedure: BILIARY BRUSHING;  Surgeon: Carol Ada, MD;  Location: [facility];  Service: Endoscopy;;   BILIARY STENT PLACEMENT N/A 09/23/2020   Procedure: BILIARY STENT PLACEMENT;  Surgeon: Carol Ada, MD;  Location: [facility];  Service: Endoscopy;  Laterality: N/A;   COLONOSCOPY WITH PROPOFOL N/A 01/19/2014   Procedure: COLONOSCOPY WITH PROPOFOL;  Surgeon: Juanita Craver, MD;  Location: [facility];  Service: Endoscopy;  Laterality: N/A;   ERCP N/A 09/23/2020   Procedure: ENDOSCOPIC RETROGRADE CHOLANGIOPANCREATOGRAPHY (ERCP);  Surgeon: Carol Ada, MD;  Location: [facility];  Service: Endoscopy;  Laterality: N/A;   LAPAROSCOPY N/A 10/31/2020   Procedure: STAGING LAPAROSCOPY;  Surgeon: Dwan Bolt, MD;  Location: [place];  Service: General;  Laterality: N/A;  GEN AND TAP BLOCK   SPHINCTEROTOMY  09/23/2020   Procedure: Joan Mayans;  Surgeon: Carol Ada, MD;  Location: [facility];  Service: Endoscopy;;   WHIPPLE PROCEDURE N/A 10/31/2020   Procedure: WHIPPLE PROCEDURE, INTRAOPERATIVE ULTRASOUND;  Surgeon: Dwan Bolt, MD;  Location: [place];  Service: General;  Laterality: N/A;   WISDOM TOOTH EXTRACTION      65 years old    I have reviewed the social history and family history with the patient and they are unchanged from previous note.  ALLERGIES:  has No Known Allergies.  MEDICATIONS:  Current Outpatient Medications  Medication Sig Dispense Refill   acetaminophen (TYLENOL) 325 MG tablet Take 2 tablets (650 mg total) by mouth every 6 (six) hours as needed for mild pain.     albuterol (VENTOLIN HFA) 108 (90 Base) MCG/ACT inhaler Inhale 2 puffs into the lungs every 6 (six) hours as needed for wheezing or shortness of breath. 8 g 12   Ascorbic Acid (VITAMIN C) 1000 MG tablet Take 1,000 mg by mouth daily.     aspirin EC 81 MG tablet Take 81 mg by mouth daily. Swallow whole.     busPIRone (BUSPAR) 5 MG tablet Take 5 mg by mouth at bedtime.     capecitabine (XELODA) 500 MG tablet TAKE 4 TABLETS (2,000 MG TOTAL) BY MOUTH 2 (TWO) TIMES DAILY AFTER A MEAL. TAKE FOR 14 DAYS THEN OFF 7 DAYS 112 tablet 0   Cholecalciferol (  VITAMIN D) 2000 UNITS tablet Take 2,000 Units by mouth daily.     CHROMIUM PO Take 1 tablet by mouth daily.     clonazePAM (KLONOPIN) 0.5 MG tablet TAKE 1 TABLET BY MOUTH TWICE DAILY AS NEEDED (Patient taking differently: Take 0.5 mg by mouth 2 (two) times daily as needed for anxiety.) 60 tablet 5   cloNIDine (CATAPRES) 0.1 MG tablet Take 0.1 mg by mouth 2 (two) times daily. (Patient not taking: Reported on 10/20/2020)     docusate sodium (COLACE) 100 MG capsule Take 1 capsule (100 mg total) by mouth 2 (two) times daily. 60 capsule 0   doxylamine, Sleep, (UNISOM) 25 MG tablet Take 25 mg by mouth at bedtime as needed for sleep.     ferrous sulfate 324 MG TBEC Take 324 mg by mouth daily with breakfast.     icosapent Ethyl (VASCEPA) 1 g capsule TAKE 2 CAPSULES(2 GRAMS) BY MOUTH TWICE DAILY 360 capsule 1   magnesium gluconate (MAGONATE) 500 MG tablet Take 500 mg by mouth daily.     metFORMIN (GLUCOPHAGE) 1000 MG tablet TAKE 1 TABLET(1000 MG) BY MOUTH TWICE DAILY WITH A MEAL 180 tablet 3    Multiple Vitamin (MULTIVITAMIN WITH MINERALS) TABS tablet Take 1 tablet by mouth daily.     PARoxetine (PAXIL) 30 MG tablet Take 60 mg by mouth at bedtime.     Potassium 99 MG TABS Take 99 mg by mouth daily.     QUEtiapine (SEROQUEL) 50 MG tablet Take 50 mg by mouth at bedtime.     tiaGABine (GABITRIL) 4 MG tablet Take 8 mg by mouth at bedtime.     vitamin B-12 (CYANOCOBALAMIN) 500 MCG tablet Take 500 mcg by mouth daily before supper.      No current facility-administered medications for this visit.    PHYSICAL EXAMINATION: ECOG PERFORMANCE STATUS: 1 - Symptomatic but completely ambulatory  Vitals:   12/20/20 1010  BP: (!) 156/95  Pulse: 73  Resp: 17  Temp: 98.4 F (36.9 C)  SpO2: 98%   Wt Readings from Last 3 Encounters:  12/20/20 179 lb 11.2 oz (81.5 kg)  11/24/20 178 lb 9.6 oz (81 kg)  11/23/20 179 lb (81.2 kg)     GENERAL:alert, no distress and comfortable SKIN: skin color, texture, turgor are normal, no rashes or significant lesions EYES: normal, Conjunctiva are pink and non-injected, sclera clear OROPHARYNX:no exudate, and lips, buccal mucosa, and tongue normal. (+) one small mouth sore present. NEURO: alert & oriented x 3 with fluent speech, no focal motor/sensory deficits  LABORATORY DATA:  I have reviewed the data as listed CBC Latest Ref Rng & Units 12/20/2020 11/23/2020 11/05/2020  WBC 4.0 - 10.5 K/uL 5.8 9.0 7.4  Hemoglobin 13.0 - 17.0 g/dL 12.7(L) 12.2(L) 10.7(L)  Hematocrit 39.0 - 52.0 % 37.5(L) 36.8(L) 30.5(L)  Platelets 150 - 400 K/uL 210 270 159     CMP Latest Ref Rng & Units 12/20/2020 11/23/2020 11/05/2020  Glucose 70 - 99 mg/dL 109(H) 89 102(H)  BUN 8 - 23 mg/dL _0 Creatinine 0.61 - 1.24 mg/dL 0.67 0.65 0.52(L)  Sodium 135 - 145 mmol/L 141 139 135  Potassium 3.5 - 5.1 mmol/L 3.8 4.2 3.5  Chloride 98 - 111 mmol/L 105 104 103  CO2 22 - 32 mmol/L _1 Calcium 8.9 - 10.3 mg/dL 8.7(L) 8.7(L) 8.4(L)  Total Protein 6.5 - 8.1 g/dL 6.6 6.2(L)  4.7(L)  Total Bilirubin 0.3 - 1.2 mg/dL 0.3 0.2(L) 0.6  Alkaline  Phos 38 - 126 U/L 61 89 85  AST 15 - 41 U/L 16 16 34  ALT 0 - 44 U/L 13 13 40      RADIOGRAPHIC STUDIES: I have personally reviewed the radiological images as listed and agreed with the findings in the report. No results found.    No orders of the defined types were placed in this encounter.  All questions were answered. The patient knows to call the clinic with any problems, questions or concerns. No barriers to learning was detected. The total time spent in the appointment was 30 minutes.     [name], MD 12/20/2020   I, Wilburn Mylar, am acting as scribe for [name], MD.   I have reviewed the above documentation for accuracy and completeness, and I agree with the above.

## 2020-12-26 ENCOUNTER — Encounter: Payer: Self-pay | Admitting: Nurse Practitioner

## 2020-12-26 NOTE — Progress Notes (Signed)
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  Will see them on 01/09/21 after appointment with provider to obtain copies of documentation and complete grant paperwork.  They have my card for any additional financial questions or concerns.

## 2021-01-03 ENCOUNTER — Other Ambulatory Visit (HOSPITAL_COMMUNITY): Payer: Self-pay

## 2021-01-03 ENCOUNTER — Telehealth: Payer: Self-pay | Admitting: *Deleted

## 2021-01-03 MED ORDER — PREDNISOLONE SODIUM PHOSPHATE 15 MG/5ML PO SOLN
OROMUCOSAL | 1 refills | Status: DC
  Filled 2021-01-03: qty 480, 12d supply, fill #0

## 2021-01-03 MED ORDER — STERILE WATER FOR INJECTION IJ SOLN
OROMUCOSAL | 1 refills | Status: DC
  Filled 2021-01-03: qty 480, 12d supply, fill #0

## 2021-01-03 NOTE — Telephone Encounter (Signed)
Pt wife called stating pt is has sores in mouth. MMW was called in to [place] with instructions. Wife notified of pharmacy pick up

## 2021-01-08 NOTE — Progress Notes (Signed)
[name] [name]   [phone] [phone]   Clinic Follow up Note   Patient Care Team: Hoyt Koch, MD as PCP - General (Internal Medicine) Deneise Lever, MD as Consulting Physician (Pulmonary Disease) Dwan Bolt, MD as Consulting Physician (General Surgery) Kyung Rudd, MD as Consulting Physician (Radiation Oncology) [name], MD as Consulting Physician (Hematology) [name], MD as Consulting Physician (Hematology) Carol Ada, MD as Consulting Physician (Gastroenterology) 01/09/2021  CHIEF COMPLAINT: Follow up extrahepatic cholangiocarcinoma  SUMMARY OF ONCOLOGIC HISTORY: Oncology History Overview Note  Cancer Staging Cholangiocarcinoma [facility]) Staging form: Distal Bile Duct, AJCC 8th Edition - Pathologic stage from 10/31/2020: Stage I (pT1, pN0, cM0) - Signed by [name], MD on 11/22/2020    Cholangiocarcinoma ([place])  09/22/2020 Imaging   US Abdomen  IMPRESSION: Intrahepatic biliary ductal dilation and dilated proximal common bile duct. Findings are consistent with biliary obstruction, possibly due to a common bile duct mass, incompletely evaluated by ultrasound. Recommend multiphase CT or MRCP for further evaluation.   Mildly distended gallbladder filled with material of varying echogenicity, likely sludge and tiny stones. In the absence of leukocytosis and right upper quadrant pain this is unlikely to represent cholecystitis.   Coarsened liver echogenicity with somewhat nodular left hepatic lobe, compatible with chronic liver disease.   09/22/2020 Imaging   CT AP  IMPRESSION: 1. Question bibasilar pulmonary fibrosis. 2. Homogeneous hyperdensity fills the gallbladder lumen - likely represents gallbladder sludge. Intra and extrahepatic biliary ductal dilatation with no main pancreatic duct dilatation. Findings are consistent with biliary obstruction, possibly due to a common bile duct mass which is incompletely  evaluated on this single phase portal venous study. Recommend MRCP for further evaluation. 3. Nonspecific perirectal fat stranding with under distension of the rectum. 4. Aortic Atherosclerosis (ICD10-I70.0).   09/23/2020 Imaging   MRI Abdomen MRCP  IMPRESSION: 1. Obstructing soft tissue mass in the proximal common bile duct at or adjacent to the junction with the cystic duct, associated with moderate to severe intrahepatic biliary ductal dilatation indicating obstruction, as discussed above. 2. Biliary sludge filling the gallbladder lumen.   09/23/2020 Procedure   ERCP, Dr. Benson Norway  Impression: - The major papilla appeared normal. - A single localized biliary stricture was found in the common bile duct. The stricture was malignant appearing. - The left and right hepatic ducts and all intrahepatic branches and common bile duct were moderately dilated, with a mass causing an obstruction. - A biliary sphincterotomy was performed. - Cells for cytology obtained in the upper third of the main bile duct. - One plastic biliary stent was placed into the common bile duct.   09/23/2020 Pathology Results   A. BILIARY, BRUSHING:   FINAL MICROSCOPIC DIAGNOSIS:  - Malignant cells consistent with adenocarcinoma    10/09/2020 Initial Diagnosis   Cholangiocarcinoma ([place])   10/10/2020 PET scan   IMPRESSION: 1. No evidence of metastatic disease. Common bile duct mass, better seen and described on MR abdomen 09/23/2020. 2. Common bile duct stent in place with persistent biliary ductal dilatation and associated pneumobilia. 3. Tiny left renal stone. 4. Bladder wall thickening. 5. Aortic atherosclerosis (ICD10-I70.0). Coronary artery calcification. 6.  Emphysema (ICD10-J43.9).   10/10/2020 Imaging   CT AP  IMPRESSION: No suspicious liver lesions. Replaced right hepatic artery arising from the SMA.   Common bile duct tumor appears unchanged compared to prior CT. Common bile duct stent place.    Findings of fibrotic interstitial lung disease in the partially visualized lower chest,  could be further evaluated with dedicated high-resolution chest CT.   10/31/2020 Cancer Staging   Staging form: Distal Bile Duct, AJCC 8th Edition - Pathologic stage from 10/31/2020: Stage I (pT1, pN0, cM0) - Signed by [name], MD on 11/22/2020 Stage prefix: Initial diagnosis Total positive nodes: 0 Residual tumor (R): R1 - Microscopic    10/31/2020 Surgery   Whipple Procedure, Dr. Michaelle Birks   10/31/2020 Pathology Results   FINAL MICROSCOPIC DIAGNOSIS:   A. COMMON HEPATIC DUCT MARGIN:  - Adenocarcinoma involving bile duct.   B. NEW COMMON HEPATIC DUCT MARGIN:  - Atypical glands consistent with adenocarcinoma.   C. RIGHT HEPATIC DUCT MARGIN:  - Connective tissue with glands showing cautery artifact.  - No diagnostic malignancy identified.   D. GALLBLADDER, CHOLECYSTECTOMY:  - Benign gallbladder.  - Benign cystic duct lymph node.   E. LYMPH NODE, STATION 8, EXCISION:  - Two lymph nodes negative for metastatic carcinoma (0/2).   F. WHIPPLE PROCEDURE:  - Ductal adenocarcinoma, 1.6 cm.  - Carcinoma involves ampulla of Vater and periductal connective tissue.  - Perineural invasion.  - Surgical margins negative for carcinoma.  - Six lymph nodes negative for metastatic carcinoma (0/6).  - See oncology table.   G. LYMPH NODE, PORTAL, EXCISION:  - One lymph node negative for metastatic carcinoma (0/1).    10/31/2020 Cancer Staging   Staging form: Distal Bile Duct, AJCC 8th Edition - Pathologic stage from 10/31/2020: Stage I (pT1, pN0, cM0) - Signed by [name], MD on 11/23/2020 Total positive nodes: 0 Residual tumor (R): R1 - Microscopic      CURRENT THERAPY: Adjuvant Xeloda 2000 mg q12h on days 1 - 14, q21 days. Plan to begin concurrent radiation after 3 cycles of Xeloda. Dose reduced to 1500 mg BID with cycle 3 due to hand/foot syndrome  INTERVAL HISTORY: [name] [name] returns for  follow up and chemo as scheduled. Last seen 12/20/20 and began cycle 2 Xeloda on 11/7, today is his first day of week off.  He feels mostly well in general.  He began having dry eyes and a couple nosebleeds that last no more than 10 minutes.  He uses a CPAP and turn the heat on at home.  Denies mucositis, eating and drinking well.  Bowels moving normally with stool softener, denies nausea/vomiting.  His sleep has improved.  Energy is normal.  On week 2 of cycle 2 he developed red, swollen, and puffy hands, he has noticed decreased grip strength.  He rates the discomfort a 7 out of 10, feet are affected but not as bad.  Otherwise denies fever, chills, cough, chest pain, dyspnea, rash, new or worsening pain, or any other complaints.     MEDICAL HISTORY:  Past Medical History:  Diagnosis Date   Alcoholism ([place])    Anxiety    Bipolar disorder ([place])    Cancer ([place])    COPD (chronic obstructive pulmonary disease) ([place])    Depression    " post traumatic stress disorder" sees MD in [place] every 3-6 months.   Diabetes mellitus without complication (H[facility])    Dysrhythmia    hx. Bundle branch block- saw Dr. Kathlee Nations   GERD (gastroesophageal reflux disease)    Hypertension    Irregular heart rate    Sleep apnea    cpap used with nose clip- Dr. Baird Lyons follows   Substance abuse [facility])    "tends to self medicate(Rx. meds or Alcohol) trying to get relief from "PSTD"  SURGICAL HISTORY: Past Surgical History:  Procedure Laterality Date   BILIARY BRUSHING  09/23/2020   Procedure: BILIARY BRUSHING;  Surgeon: Carol Ada, MD;  Location: [facility];  Service: Endoscopy;;   BILIARY STENT PLACEMENT N/A 09/23/2020   Procedure: BILIARY STENT PLACEMENT;  Surgeon: Carol Ada, MD;  Location: [facility];  Service: Endoscopy;  Laterality: N/A;   COLONOSCOPY WITH PROPOFOL N/A 01/19/2014   Procedure: COLONOSCOPY WITH PROPOFOL;  Surgeon: Juanita Craver, MD;  Location: [facility];   Service: Endoscopy;  Laterality: N/A;   ERCP N/A 09/23/2020   Procedure: ENDOSCOPIC RETROGRADE CHOLANGIOPANCREATOGRAPHY (ERCP);  Surgeon: Carol Ada, MD;  Location: [facility];  Service: Endoscopy;  Laterality: N/A;   LAPAROSCOPY N/A 10/31/2020   Procedure: STAGING LAPAROSCOPY;  Surgeon: Dwan Bolt, MD;  Location: [place];  Service: General;  Laterality: N/A;  GEN AND TAP BLOCK   SPHINCTEROTOMY  09/23/2020   Procedure: Joan Mayans;  Surgeon: Carol Ada, MD;  Location: [facility];  Service: Endoscopy;;   WHIPPLE PROCEDURE N/A 10/31/2020   Procedure: WHIPPLE PROCEDURE, INTRAOPERATIVE ULTRASOUND;  Surgeon: Dwan Bolt, MD;  Location: [place];  Service: General;  Laterality: N/A;   WISDOM TOOTH EXTRACTION     65 years old    I have reviewed the social history and family history with the patient and they are unchanged from previous note.  ALLERGIES:  has No Known Allergies.  MEDICATIONS:  Current Outpatient Medications  Medication Sig Dispense Refill   acetaminophen (TYLENOL) 325 MG tablet Take 2 tablets (650 mg total) by mouth every 6 (six) hours as needed for mild pain.     albuterol (VENTOLIN HFA) 108 (90 Base) MCG/ACT inhaler Inhale 2 puffs into the lungs every 6 (six) hours as needed for wheezing or shortness of breath. 8 g 12   Ascorbic Acid (VITAMIN C) 1000 MG tablet Take 1,000 mg by mouth daily.     aspirin EC 81 MG tablet Take 81 mg by mouth daily. Swallow whole.     busPIRone (BUSPAR) 5 MG tablet Take 5 mg by mouth at bedtime.     capecitabine (XELODA) 500 MG tablet TAKE 4 TABLETS (2,000 MG TOTAL) BY MOUTH 2 (TWO) TIMES DAILY AFTER A MEAL. TAKE FOR 14 DAYS THEN OFF 7 DAYS 112 tablet 0   Cholecalciferol (VITAMIN D) 2000 UNITS tablet Take 2,000 Units by mouth daily.     CHROMIUM PO Take 1 tablet by mouth daily.     clonazePAM (KLONOPIN) 0.5 MG tablet TAKE 1 TABLET BY MOUTH TWICE DAILY AS NEEDED (Patient taking differently: Take 0.5 mg by mouth 2 (two) times daily as  needed for anxiety.) 60 tablet 5   cloNIDine (CATAPRES) 0.1 MG tablet Take 0.1 mg by mouth 2 (two) times daily. (Patient not taking: Reported on 10/20/2020)     docusate sodium (COLACE) 100 MG capsule Take 1 capsule (100 mg total) by mouth 2 (two) times daily. 60 capsule 0   doxylamine, Sleep, (UNISOM) 25 MG tablet Take 25 mg by mouth at bedtime as needed for sleep.     ferrous sulfate 324 MG TBEC Take 324 mg by mouth daily with breakfast.     icosapent Ethyl (VASCEPA) 1 g capsule TAKE 2 CAPSULES(2 GRAMS) BY MOUTH TWICE DAILY 360 capsule 1   magic mouthwash (multi-ingredient) oral suspension Swish, gargle, and spit 5-10 mls for 1 minute. Repeat every 6 hours as needed. May swallow if throat involved. 480 mL 1   magnesium gluconate (MAGONATE) 500 MG tablet Take 500 mg by mouth  daily.     metFORMIN (GLUCOPHAGE) 1000 MG tablet TAKE 1 TABLET(1000 MG) BY MOUTH TWICE DAILY WITH A MEAL 180 tablet 3   Multiple Vitamin (MULTIVITAMIN WITH MINERALS) TABS tablet Take 1 tablet by mouth daily.     PARoxetine (PAXIL) 30 MG tablet Take 60 mg by mouth at bedtime.     Potassium 99 MG TABS Take 99 mg by mouth daily.     QUEtiapine (SEROQUEL) 50 MG tablet Take 50 mg by mouth at bedtime.     tiaGABine (GABITRIL) 4 MG tablet Take 8 mg by mouth at bedtime.     vitamin B-12 (CYANOCOBALAMIN) 500 MCG tablet Take 500 mcg by mouth daily before supper.      No current facility-administered medications for this visit.    PHYSICAL EXAMINATION: ECOG PERFORMANCE STATUS: 1 - Symptomatic but completely ambulatory  Vitals:   01/09/21 1042  BP: (!) 152/95  Pulse: 70  Resp: 18  Temp: (!) 97.3 F (36.3 C)  SpO2: 97%   Filed Weights   01/09/21 1042  Weight: 177 lb 9 oz (80.5 kg)    GENERAL:alert, no distress and comfortable SKIN: no rash. Palms with moderate erythema and edema, thumbs covered with bandaid. Feet dry without breakdown or erythema  EYES: sclera clear OROPHARYNX: No thrush or ulcers LUNGS: clear with  normal breathing effort HEART: regular rate & rhythm, no lower extremity edema ABDOMEN:abdomen soft, non-tender and normal bowel sounds NEURO: alert & oriented x 3 with fluent speech   LABORATORY DATA:  I have reviewed the data as listed CBC Latest Ref Rng & Units 01/09/2021 12/20/2020 11/23/2020  WBC 4.0 - 10.5 K/uL 4.9 5.8 9.0  Hemoglobin 13.0 - 17.0 g/dL 11.9(L) 12.7(L) 12.2(L)  Hematocrit 39.0 - 52.0 % 33.4(L) 37.5(L) 36.8(L)  Platelets 150 - 400 K/uL 177 210 270     CMP Latest Ref Rng & Units 01/09/2021 12/20/2020 11/23/2020  Glucose 70 - 99 mg/dL 111(H) 109(H) 89  BUN 8 - 23 mg/dL 18 12 11   Creatinine 0.61 - 1.24 mg/dL 0.77 0.67 0.65  Sodium 135 - 145 mmol/L 140 141 139  Potassium 3.5 - 5.1 mmol/L 4.2 3.8 4.2  Chloride 98 - 111 mmol/L 107 105 104  CO2 22 - 32 mmol/L 26 28 27   Calcium 8.9 - 10.3 mg/dL 8.7(L) 8.7(L) 8.7(L)  Total Protein 6.5 - 8.1 g/dL 6.3(L) 6.6 6.2(L)  Total Bilirubin 0.3 - 1.2 mg/dL 0.3 0.3 0.2(L)  Alkaline Phos 38 - 126 U/L 61 61 89  AST 15 - 41 U/L 17 16 16   ALT 0 - 44 U/L 14 13 13       RADIOGRAPHIC STUDIES: I have personally reviewed the radiological images as listed and agreed with the findings in the report. No results found.   ASSESSMENT & PLAN: [name] [name] is a 65 y.o. male with    1.  Extrahepatic cholangiocarcinoma, pT1cN0M0, stage I -presented to ED on 09/22/20 with obstructive jaundice and abdominal pain; bilirubin of 15   -Abdomen MRI on 09/23/20 revealed obstructing soft tissue mass in proximal common bile duct. ERCP with bile duct brushing on 09/23/20 under Dr. Benson Norway confirmed adenocarcinoma.  He is status post CBD stent placement -PET 10/10/20 showed no evidence of nodal or metastatic disease. -baseline CEA and CA 19-9 were normal. -S/p whipple on 10/31/20 under Dr. Zenia Resides. Pathology showed: ductal adenocarcinoma, 1.6 cm, involving ampulla of Vater and periductal connective tissue; 9 negative lymph nodes (0/9). Hepatic duct margin was  positive.  -He began adjuvant Xeloda  1000mg /m2 q12h for day 1-14 evey 21 days on 12/05/20. Plan to give for 6 months, per BILCAP trial data.  -Rad onk plans to initiate radiation after he has completed 3 cycles of Xeloda. -S/p 2 cycles, tolerating moderately well.  He has developed dry eyes, epistaxis, and moderate hand-foot syndrome   2.  History of alcohol addiction, heavy smoking  -He was alcoholic, but has stopped completely.  He is still seeing psychiatrist but no longer requires medication for cravings.  -he is a heavy smoker.  Has tried to switch to vaping but ultimately returned to smoking -Continue discussing smoking cessation    Disposition: Mr. Newey appears stable.  He completed 2 cycles of adjuvant Xeloda, tolerated moderately well.  He has developed dry eyes, epistaxis, and moderate hand-foot syndrome on the last week of cycle 2.  Pain and weakened grip strength are affecting his function.  He is currently on his week off between cycles.  We reviewed symptom management, he will continue urea cream and add/alternate hydrocortisone cream.  If he recovers well, he will begin cycle 3 as expected 11/28 with dose reduction to 1500 mg twice daily for 2 weeks on/1 week off.  He will communicate his recovery via MyChart.  For dry eyes and epistaxis I recommend room humidifier.  Labs reviewed.  I sent a message to rad onc about his treatment timeline.  F/up in 3 weeks, or sooner if needed. He knows to contact our office if symptoms worsen or fail to improve.   All questions were answered. The patient knows to call the clinic with any problems, questions or concerns. No barriers to learning were detected.     Alla Feeling, NP 01/09/21

## 2021-01-09 ENCOUNTER — Inpatient Hospital Stay: Payer: Federal, State, Local not specified - PPO

## 2021-01-09 ENCOUNTER — Other Ambulatory Visit: Payer: Self-pay

## 2021-01-09 ENCOUNTER — Encounter: Payer: Self-pay | Admitting: Nurse Practitioner

## 2021-01-09 ENCOUNTER — Inpatient Hospital Stay (HOSPITAL_BASED_OUTPATIENT_CLINIC_OR_DEPARTMENT_OTHER): Payer: Federal, State, Local not specified - PPO | Admitting: Nurse Practitioner

## 2021-01-09 ENCOUNTER — Other Ambulatory Visit: Payer: Self-pay | Admitting: Hematology

## 2021-01-09 VITALS — BP 152/95 | HR 70 | Temp 97.3°F | Resp 18 | Wt 177.6 lb

## 2021-01-09 DIAGNOSIS — C221 Intrahepatic bile duct carcinoma: Secondary | ICD-10-CM

## 2021-01-09 LAB — CBC WITH DIFFERENTIAL (CANCER CENTER ONLY)
Abs Immature Granulocytes: 0.01 10*3/uL (ref 0.00–0.07)
Basophils Absolute: 0 10*3/uL (ref 0.0–0.1)
Basophils Relative: 1 %
Eosinophils Absolute: 0.1 10*3/uL (ref 0.0–0.5)
Eosinophils Relative: 3 %
HCT: 33.4 % — ABNORMAL LOW (ref 39.0–52.0)
Hemoglobin: 11.9 g/dL — ABNORMAL LOW (ref 13.0–17.0)
Immature Granulocytes: 0 %
Lymphocytes Relative: 39 %
Lymphs Abs: 1.9 10*3/uL (ref 0.7–4.0)
MCH: 33.1 pg (ref 26.0–34.0)
MCHC: 35.6 g/dL (ref 30.0–36.0)
MCV: 93 fL (ref 80.0–100.0)
Monocytes Absolute: 0.4 10*3/uL (ref 0.1–1.0)
Monocytes Relative: 8 %
Neutro Abs: 2.5 10*3/uL (ref 1.7–7.7)
Neutrophils Relative %: 49 %
Platelet Count: 177 10*3/uL (ref 150–400)
RBC: 3.59 MIL/uL — ABNORMAL LOW (ref 4.22–5.81)
RDW: 14.6 % (ref 11.5–15.5)
WBC Count: 4.9 10*3/uL (ref 4.0–10.5)
nRBC: 0 % (ref 0.0–0.2)

## 2021-01-09 LAB — CMP (CANCER CENTER ONLY)
ALT: 14 U/L (ref 0–44)
AST: 17 U/L (ref 15–41)
Albumin: 3.8 g/dL (ref 3.5–5.0)
Alkaline Phosphatase: 61 U/L (ref 38–126)
Anion gap: 7 (ref 5–15)
BUN: 18 mg/dL (ref 8–23)
CO2: 26 mmol/L (ref 22–32)
Calcium: 8.7 mg/dL — ABNORMAL LOW (ref 8.9–10.3)
Chloride: 107 mmol/L (ref 98–111)
Creatinine: 0.77 mg/dL (ref 0.61–1.24)
GFR, Estimated: >60 mL/min (ref >60)
Glucose, Bld: 111 mg/dL — ABNORMAL HIGH (ref 70–99)
Potassium: 4.2 mmol/L (ref 3.5–5.1)
Sodium: 140 mmol/L (ref 135–145)
Total Bilirubin: 0.3 mg/dL (ref 0.3–1.2)
Total Protein: 6.3 g/dL — ABNORMAL LOW (ref 6.5–8.1)

## 2021-01-09 NOTE — Progress Notes (Signed)
Met with patient/accompanying adult at registration to complete grant paperwork.  Patient approved for one-time $1000 Advertising account executive and qualifications to assist with personal expenses while going through treatment. Discussed in detail expenses and how they are covered. He received a gift card today.  He has a copy of the approval letter and expense sheet along with the Outpatient pharmacy information in a green folder. My card for any additional financial questions or concerns included as well.

## 2021-01-10 LAB — CANCER ANTIGEN 19-9: CA 19-9: 13 U/mL (ref 0–35)

## 2021-01-11 ENCOUNTER — Telehealth: Payer: Self-pay | Admitting: Hematology

## 2021-01-11 NOTE — Telephone Encounter (Signed)
Scheduled follow-up appointment per 11/21 los. [name] is aware.

## 2021-01-24 ENCOUNTER — Ambulatory Visit
Admission: RE | Admit: 2021-01-24 | Discharge: 2021-01-24 | Disposition: A | Discharge status: Home / Self Care | Payer: Federal, State, Local not specified - PPO | Source: Ambulatory Visit | Attending: Radiation Oncology | Admitting: Radiation Oncology

## 2021-01-24 ENCOUNTER — Other Ambulatory Visit: Payer: Self-pay

## 2021-01-24 DIAGNOSIS — C221 Intrahepatic bile duct carcinoma: Secondary | ICD-10-CM | POA: Insufficient documentation

## 2021-01-24 DIAGNOSIS — I10 Essential (primary) hypertension: Secondary | ICD-10-CM | POA: Diagnosis not present

## 2021-01-24 DIAGNOSIS — Z510 Encounter for antineoplastic radiation therapy: Secondary | ICD-10-CM | POA: Insufficient documentation

## 2021-01-24 DIAGNOSIS — E119 Type 2 diabetes mellitus without complications: Secondary | ICD-10-CM | POA: Insufficient documentation

## 2021-01-30 ENCOUNTER — Other Ambulatory Visit: Payer: Self-pay

## 2021-01-30 ENCOUNTER — Inpatient Hospital Stay: Payer: Federal, State, Local not specified - PPO | Admitting: Hematology

## 2021-01-30 ENCOUNTER — Inpatient Hospital Stay (HOSPITAL_BASED_OUTPATIENT_CLINIC_OR_DEPARTMENT_OTHER): Payer: Federal, State, Local not specified - PPO

## 2021-01-30 ENCOUNTER — Encounter: Payer: Self-pay | Admitting: Hematology

## 2021-01-30 VITALS — BP 150/87 | HR 76 | Temp 98.4°F | Resp 16 | Ht 70.0 in | Wt 174.1 lb

## 2021-01-30 DIAGNOSIS — I10 Essential (primary) hypertension: Secondary | ICD-10-CM | POA: Insufficient documentation

## 2021-01-30 DIAGNOSIS — F17210 Nicotine dependence, cigarettes, uncomplicated: Secondary | ICD-10-CM | POA: Insufficient documentation

## 2021-01-30 DIAGNOSIS — C221 Intrahepatic bile duct carcinoma: Secondary | ICD-10-CM

## 2021-01-30 DIAGNOSIS — R197 Diarrhea, unspecified: Secondary | ICD-10-CM | POA: Insufficient documentation

## 2021-01-30 DIAGNOSIS — E119 Type 2 diabetes mellitus without complications: Secondary | ICD-10-CM | POA: Insufficient documentation

## 2021-01-30 LAB — CBC WITH DIFFERENTIAL (CANCER CENTER ONLY)
Abs Immature Granulocytes: 0.02 10*3/uL (ref 0.00–0.07)
Basophils Absolute: 0 10*3/uL (ref 0.0–0.1)
Basophils Relative: 1 %
Eosinophils Absolute: 0.1 10*3/uL (ref 0.0–0.5)
Eosinophils Relative: 2 %
HCT: 35.1 % — ABNORMAL LOW (ref 39.0–52.0)
Hemoglobin: 12.2 g/dL — ABNORMAL LOW (ref 13.0–17.0)
Immature Granulocytes: 0 %
Lymphocytes Relative: 34 %
Lymphs Abs: 2 10*3/uL (ref 0.7–4.0)
MCH: 33.1 pg (ref 26.0–34.0)
MCHC: 34.8 g/dL (ref 30.0–36.0)
MCV: 95.1 fL (ref 80.0–100.0)
Monocytes Absolute: 0.4 10*3/uL (ref 0.1–1.0)
Monocytes Relative: 6 %
Neutro Abs: 3.4 10*3/uL (ref 1.7–7.7)
Neutrophils Relative %: 57 %
Platelet Count: 226 10*3/uL (ref 150–400)
RBC: 3.69 MIL/uL — ABNORMAL LOW (ref 4.22–5.81)
RDW: 18.7 % — ABNORMAL HIGH (ref 11.5–15.5)
WBC Count: 5.9 10*3/uL (ref 4.0–10.5)
nRBC: 0 % (ref 0.0–0.2)

## 2021-01-30 LAB — CMP (CANCER CENTER ONLY)
ALT: 18 U/L (ref 0–44)
AST: 22 U/L (ref 15–41)
Albumin: 3.5 g/dL (ref 3.5–5.0)
Alkaline Phosphatase: 64 U/L (ref 38–126)
Anion gap: 7 (ref 5–15)
BUN: 16 mg/dL (ref 8–23)
CO2: 24 mmol/L (ref 22–32)
Calcium: 8.2 mg/dL — ABNORMAL LOW (ref 8.9–10.3)
Chloride: 109 mmol/L (ref 98–111)
Creatinine: 0.7 mg/dL (ref 0.61–1.24)
GFR, Estimated: >60 mL/min (ref >60)
Glucose, Bld: 136 mg/dL — ABNORMAL HIGH (ref 70–99)
Potassium: 4 mmol/L (ref 3.5–5.1)
Sodium: 140 mmol/L (ref 135–145)
Total Bilirubin: 0.5 mg/dL (ref 0.3–1.2)
Total Protein: 5.8 g/dL — ABNORMAL LOW (ref 6.5–8.1)

## 2021-01-30 MED ORDER — CAPECITABINE 500 MG PO TABS: ORAL_TABLET | ORAL | 1 refills | Status: DC

## 2021-01-30 NOTE — Progress Notes (Signed)
[place]   [phone] [phone]) [phone]   Clinic Follow up Note   Patient Care Team: Hoyt Koch, MD as PCP - General (Internal Medicine) Deneise Lever, MD as Consulting Physician (Pulmonary Disease) Dwan Bolt, MD as Consulting Physician (General Surgery) Kyung Rudd, MD as Consulting Physician (Radiation Oncology) [name], MD as Consulting Physician (Hematology) [name], MD as Consulting Physician (Hematology) Carol Ada, MD as Consulting Physician (Gastroenterology)  Date of Service:  01/30/2021  CHIEF COMPLAINT: f/u of extrahepatic cholangiocarcinoma  CURRENT THERAPY:   Adjuvant Xeloda 2000 mg q12h on days 1 - 14, q21 days. Plan to begin concurrent radiation after 3 cycles of Xeloda. Dose reduced to 1500 mg BID with cycle 3 due to hand/foot syndrome  ASSESSMENT & PLAN:  [name] [name] is a 65 y.o. male with   1.  Extrahepatic cholangiocarcinoma, pT1cN0M0, stage I -presented to ED on 09/22/20 with obstructive jaundice and abdominal pain; bilirubin of 15   -Abdomen MRI on 09/23/20 revealed obstructing soft tissue mass in proximal common bile duct. ERCP with bile duct brushing on 09/23/20 under Dr. Benson Norway confirmed adenocarcinoma.  He is status post CBD stent placement -PET 10/10/20 showed no evidence of nodal or metastatic disease. -baseline CEA and CA 19-9 were normal. -S/p whipple on 10/31/20 under Dr. Zenia Resides. Pathology showed: ductal adenocarcinoma, 1.6 cm, involving ampulla of Vater and periductal connective tissue; 9 negative lymph nodes (0/9). Hepatic duct margin was positive.  -He began adjuvant Xeloda 2000mg /m2 q12h for day 1-14 evey 21 days on 12/05/20. Plan to give for 6 months, per BILCAP trial data.  -we decreased his dose to 1500mg  BID with cycle 3 due to hand-foot syndrome -He is scheduled to begin radiation therapy on 02/06/21. Will continue Xeloda with radiation    2.  History of alcohol addiction, heavy smoking  -He  was alcoholic, but has stopped completely.  He is still seeing psychiatrist but no longer requires medication for cravings.  -he is a heavy smoker.  Has tried to switch to vaping but ultimately returned to smoking -Continue discussing smoking cessation   PLAN: -continue Xeloda 1500 mg q12hr, next cycle starts on 12/19 with concurrent radiation, he will be on Xeloda M-F only  -lab and f/u in 2 weeks, either with APP 12/27 or me 12/28, and 4 and 6 weeks   No problem-specific Assessment & Plan notes found for this encounter.   SUMMARY OF ONCOLOGIC HISTORY: Oncology History Overview Note  Cancer Staging Cholangiocarcinoma [facility]) Staging form: Distal Bile Duct, AJCC 8th Edition - Pathologic stage from 10/31/2020: Stage I (pT1, pN0, cM0) - Signed by [name], MD on 11/22/2020    Cholangiocarcinoma ([place])  09/22/2020 Imaging   US Abdomen  IMPRESSION: Intrahepatic biliary ductal dilation and dilated proximal common bile duct. Findings are consistent with biliary obstruction, possibly due to a common bile duct mass, incompletely evaluated by ultrasound. Recommend multiphase CT or MRCP for further evaluation.   Mildly distended gallbladder filled with material of varying echogenicity, likely sludge and tiny stones. In the absence of leukocytosis and right upper quadrant pain this is unlikely to represent cholecystitis.   Coarsened liver echogenicity with somewhat nodular left hepatic lobe, compatible with chronic liver disease.   09/22/2020 Imaging   CT AP  IMPRESSION: 1. Question bibasilar pulmonary fibrosis. 2. Homogeneous hyperdensity fills the gallbladder lumen - likely represents gallbladder sludge. Intra and extrahepatic biliary ductal dilatation with no main pancreatic duct dilatation. Findings are consistent with biliary obstruction, possibly  due to a common bile duct mass which is incompletely evaluated on this single phase portal venous study. Recommend MRCP for further  evaluation. 3. Nonspecific perirectal fat stranding with under distension of the rectum. 4. Aortic Atherosclerosis (ICD10-I70.0).   09/23/2020 Imaging   MRI Abdomen MRCP  IMPRESSION: 1. Obstructing soft tissue mass in the proximal common bile duct at or adjacent to the junction with the cystic duct, associated with moderate to severe intrahepatic biliary ductal dilatation indicating obstruction, as discussed above. 2. Biliary sludge filling the gallbladder lumen.   09/23/2020 Procedure   ERCP, Dr. Benson Norway  Impression: - The major papilla appeared normal. - A single localized biliary stricture was found in the common bile duct. The stricture was malignant appearing. - The left and right hepatic ducts and all intrahepatic branches and common bile duct were moderately dilated, with a mass causing an obstruction. - A biliary sphincterotomy was performed. - Cells for cytology obtained in the upper third of the main bile duct. - One plastic biliary stent was placed into the common bile duct.   09/23/2020 Pathology Results   A. BILIARY, BRUSHING:   FINAL MICROSCOPIC DIAGNOSIS:  - Malignant cells consistent with adenocarcinoma    10/09/2020 Initial Diagnosis   Cholangiocarcinoma ([place])   10/10/2020 PET scan   IMPRESSION: 1. No evidence of metastatic disease. Common bile duct mass, better seen and described on MR abdomen 09/23/2020. 2. Common bile duct stent in place with persistent biliary ductal dilatation and associated pneumobilia. 3. Tiny left renal stone. 4. Bladder wall thickening. 5. Aortic atherosclerosis (ICD10-I70.0). Coronary artery calcification. 6.  Emphysema (ICD10-J43.9).   10/10/2020 Imaging   CT AP  IMPRESSION: No suspicious liver lesions. Replaced right hepatic artery arising from the SMA.   Common bile duct tumor appears unchanged compared to prior CT. Common bile duct stent place.   Findings of fibrotic interstitial lung disease in the partially visualized lower  chest, could be further evaluated with dedicated high-resolution chest CT.   10/31/2020 Cancer Staging   Staging form: Distal Bile Duct, AJCC 8th Edition - Pathologic stage from 10/31/2020: Stage I (pT1, pN0, cM0) - Signed by [name], MD on 11/22/2020 Stage prefix: Initial diagnosis Total positive nodes: 0 Residual tumor (R): R1 - Microscopic    10/31/2020 Surgery   Whipple Procedure, Dr. Michaelle Birks   10/31/2020 Pathology Results   FINAL MICROSCOPIC DIAGNOSIS:   A. COMMON HEPATIC DUCT MARGIN:  - Adenocarcinoma involving bile duct.   B. NEW COMMON HEPATIC DUCT MARGIN:  - Atypical glands consistent with adenocarcinoma.   C. RIGHT HEPATIC DUCT MARGIN:  - Connective tissue with glands showing cautery artifact.  - No diagnostic malignancy identified.   D. GALLBLADDER, CHOLECYSTECTOMY:  - Benign gallbladder.  - Benign cystic duct lymph node.   E. LYMPH NODE, STATION 8, EXCISION:  - Two lymph nodes negative for metastatic carcinoma (0/2).   F. WHIPPLE PROCEDURE:  - Ductal adenocarcinoma, 1.6 cm.  - Carcinoma involves ampulla of Vater and periductal connective tissue.  - Perineural invasion.  - Surgical margins negative for carcinoma.  - Six lymph nodes negative for metastatic carcinoma (0/6).  - See oncology table.   G. LYMPH NODE, PORTAL, EXCISION:  - One lymph node negative for metastatic carcinoma (0/1).    10/31/2020 Cancer Staging   Staging form: Distal Bile Duct, AJCC 8th Edition - Pathologic stage from 10/31/2020: Stage I (pT1, pN0, cM0) - Signed by [name], MD on 11/23/2020 Total positive nodes: 0 Residual tumor (R): R1 -  Microscopic       INTERVAL HISTORY:  ZYMEIR SALMINEN is here for a follow up of extrahepatic cholangiocarcinoma. He was last seen by NP Lacie on 01/09/21. He presents to the clinic accompanied by his wife. He reports he developed a rash to his abdomen. He denies pain or skin breakdown, he just reports that hot water is bothersome. He is  wearing gloves, and his wife notes they put the lotion on and then the gloves to protect his hands. He denies fevers.   All other systems were reviewed with the patient and are negative.  MEDICAL HISTORY:  Past Medical History:  Diagnosis Date   Alcoholism ([place])    Anxiety    Bipolar disorder ([place])    Cancer ([place])    COPD (chronic obstructive pulmonary disease) ([place])    Depression    " post traumatic stress disorder" sees MD in [place] every 3-6 months.   Diabetes mellitus without complication ([facility])    Dysrhythmia    hx. Bundle branch block- saw Dr. Kathlee Nations   GERD (gastroesophageal reflux disease)    Hypertension    Irregular heart rate    Sleep apnea    cpap used with nose clip- Dr. Baird Lyons follows   Substance abuse [facility])    "tends to self medicate(Rx. meds or Alcohol) trying to get relief from "PSTD"    SURGICAL HISTORY: Past Surgical History:  Procedure Laterality Date   BILIARY BRUSHING  09/23/2020   Procedure: BILIARY BRUSHING;  Surgeon: Carol Ada, MD;  Location: [facility];  Service: Endoscopy;;   BILIARY STENT PLACEMENT N/A 09/23/2020   Procedure: BILIARY STENT PLACEMENT;  Surgeon: Carol Ada, MD;  Location: [facility];  Service: Endoscopy;  Laterality: N/A;   COLONOSCOPY WITH PROPOFOL N/A 01/19/2014   Procedure: COLONOSCOPY WITH PROPOFOL;  Surgeon: Juanita Craver, MD;  Location: [facility];  Service: Endoscopy;  Laterality: N/A;   ERCP N/A 09/23/2020   Procedure: ENDOSCOPIC RETROGRADE CHOLANGIOPANCREATOGRAPHY (ERCP);  Surgeon: Carol Ada, MD;  Location: [facility];  Service: Endoscopy;  Laterality: N/A;   LAPAROSCOPY N/A 10/31/2020   Procedure: STAGING LAPAROSCOPY;  Surgeon: Dwan Bolt, MD;  Location: [place];  Service: General;  Laterality: N/A;  GEN AND TAP BLOCK   SPHINCTEROTOMY  09/23/2020   Procedure: Joan Mayans;  Surgeon: Carol Ada, MD;  Location: [facility];  Service: Endoscopy;;   WHIPPLE PROCEDURE N/A 10/31/2020    Procedure: WHIPPLE PROCEDURE, INTRAOPERATIVE ULTRASOUND;  Surgeon: Dwan Bolt, MD;  Location: [place];  Service: General;  Laterality: N/A;   WISDOM TOOTH EXTRACTION     65 years old    I have reviewed the social history and family history with the patient and they are unchanged from previous note.  ALLERGIES:  has No Known Allergies.  MEDICATIONS:  Current Outpatient Medications  Medication Sig Dispense Refill   acetaminophen (TYLENOL) 325 MG tablet Take 2 tablets (650 mg total) by mouth every 6 (six) hours as needed for mild pain.     albuterol (VENTOLIN HFA) 108 (90 Base) MCG/ACT inhaler Inhale 2 puffs into the lungs every 6 (six) hours as needed for wheezing or shortness of breath. 8 g 12   Ascorbic Acid (VITAMIN C) 1000 MG tablet Take 1,000 mg by mouth daily.     aspirin EC 81 MG tablet Take 81 mg by mouth daily. Swallow whole.     busPIRone (BUSPAR) 5 MG tablet Take 5 mg by mouth at bedtime.     capecitabine (XELODA) 500 MG tablet  Take 3 tabs every 12 hours on days of radiation, Monday through Friday, and off on weekends 90 tablet 1   Cholecalciferol (VITAMIN D) 2000 UNITS tablet Take 2,000 Units by mouth daily.     CHROMIUM PO Take 1 tablet by mouth daily.     clonazePAM (KLONOPIN) 0.5 MG tablet TAKE 1 TABLET BY MOUTH TWICE DAILY AS NEEDED (Patient taking differently: Take 0.5 mg by mouth 2 (two) times daily as needed for anxiety.) 60 tablet 5   cloNIDine (CATAPRES) 0.1 MG tablet Take 0.1 mg by mouth 2 (two) times daily. (Patient not taking: Reported on 10/20/2020)     docusate sodium (COLACE) 100 MG capsule Take 1 capsule (100 mg total) by mouth 2 (two) times daily. 60 capsule 0   doxylamine, Sleep, (UNISOM) 25 MG tablet Take 25 mg by mouth at bedtime as needed for sleep.     ferrous sulfate 324 MG TBEC Take 324 mg by mouth daily with breakfast.     icosapent Ethyl (VASCEPA) 1 g capsule TAKE 2 CAPSULES(2 GRAMS) BY MOUTH TWICE DAILY 360 capsule 1   magic mouthwash  (multi-ingredient) oral suspension Swish, gargle, and spit 5-10 mls for 1 minute. Repeat every 6 hours as needed. May swallow if throat involved. 480 mL 1   magnesium gluconate (MAGONATE) 500 MG tablet Take 500 mg by mouth daily.     metFORMIN (GLUCOPHAGE) 1000 MG tablet TAKE 1 TABLET(1000 MG) BY MOUTH TWICE DAILY WITH A MEAL 180 tablet 3   Multiple Vitamin (MULTIVITAMIN WITH MINERALS) TABS tablet Take 1 tablet by mouth daily.     PARoxetine (PAXIL) 30 MG tablet Take 60 mg by mouth at bedtime.     Potassium 99 MG TABS Take 99 mg by mouth daily.     QUEtiapine (SEROQUEL) 50 MG tablet Take 50 mg by mouth at bedtime.     tiaGABine (GABITRIL) 4 MG tablet Take 8 mg by mouth at bedtime.     vitamin B-12 (CYANOCOBALAMIN) 500 MCG tablet Take 500 mcg by mouth daily before supper.      No current facility-administered medications for this visit.    PHYSICAL EXAMINATION: ECOG PERFORMANCE STATUS: 1 - Symptomatic but completely ambulatory  Vitals:   01/30/21 0924  BP: (!) 150/87  Pulse: 76  Resp: 16  Temp: 98.4 F (36.9 C)   Wt Readings from Last 3 Encounters:  01/30/21 174 lb 1.6 oz (79 kg)  01/09/21 177 lb 9 oz (80.5 kg)  12/20/20 179 lb 11.2 oz (81.5 kg)     GENERAL:alert, no distress and comfortable SKIN: skin color, texture, turgor are normal, no significant lesions, (+) mild rash to mid abdomen, palms EYES: normal, Conjunctiva are pink and non-injected, sclera clear  NECK: supple, thyroid normal size, non-tender, without nodularity LYMPH:  no palpable lymphadenopathy in the cervical, axillary  LUNGS: clear to auscultation and percussion with normal breathing effort HEART: regular rate & rhythm and no murmurs and no lower extremity edema ABDOMEN:abdomen soft, non-tender and normal bowel sounds Musculoskeletal:no cyanosis of digits and no clubbing  NEURO: alert & oriented x 3 with fluent speech, no focal motor/sensory deficits  LABORATORY DATA:  I have reviewed the data as  listed CBC Latest Ref Rng & Units 01/30/2021 01/09/2021 12/20/2020  WBC 4.0 - 10.5 K/uL 5.9 4.9 5.8  Hemoglobin 13.0 - 17.0 g/dL 12.2(L) 11.9(L) 12.7(L)  Hematocrit 39.0 - 52.0 % 35.1(L) 33.4(L) 37.5(L)  Platelets 150 - 400 K/uL 226 177 210     CMP Latest Ref Rng &  Units 01/30/2021 01/09/2021 12/20/2020  Glucose 70 - 99 mg/dL 136(H) 111(H) 109(H)  BUN 8 - 23 mg/dL 16 18 12   Creatinine 0.61 - 1.24 mg/dL 0.70 0.77 0.67  Sodium 135 - 145 mmol/L 140 140 141  Potassium 3.5 - 5.1 mmol/L 4.0 4.2 3.8  Chloride 98 - 111 mmol/L 109 107 105  CO2 22 - 32 mmol/L 24 26 28   Calcium 8.9 - 10.3 mg/dL 8.2(L) 8.7(L) 8.7(L)  Total Protein 6.5 - 8.1 g/dL 5.8(L) 6.3(L) 6.6  Total Bilirubin 0.3 - 1.2 mg/dL 0.5 0.3 0.3  Alkaline Phos 38 - 126 U/L 64 61 61  AST 15 - 41 U/L 22 17 16   ALT 0 - 44 U/L 18 14 13       RADIOGRAPHIC STUDIES: I have personally reviewed the radiological images as listed and agreed with the findings in the report. No results found.    No orders of the defined types were placed in this encounter.  All questions were answered. The patient knows to call the clinic with any problems, questions or concerns. No barriers to learning was detected. The total time spent in the appointment was 30 minutes.     [name], MD 01/30/2021   I, Wilburn Mylar, am acting as scribe for [name], MD.   I have reviewed the above documentation for accuracy and completeness, and I agree with the above.

## 2021-01-31 ENCOUNTER — Telehealth: Payer: Self-pay | Admitting: Hematology

## 2021-01-31 NOTE — Telephone Encounter (Signed)
Scheduled follow-up appointments per 12/12 los. [name] is aware.

## 2021-02-03 DIAGNOSIS — C221 Intrahepatic bile duct carcinoma: Secondary | ICD-10-CM | POA: Diagnosis not present

## 2021-02-06 ENCOUNTER — Other Ambulatory Visit: Payer: Self-pay

## 2021-02-06 ENCOUNTER — Ambulatory Visit
Admission: RE | Admit: 2021-02-06 | Discharge: 2021-02-06 | Disposition: A | Discharge status: Home / Self Care | Payer: Federal, State, Local not specified - PPO | Source: Ambulatory Visit | Attending: Radiation Oncology | Admitting: Radiation Oncology

## 2021-02-06 DIAGNOSIS — C221 Intrahepatic bile duct carcinoma: Secondary | ICD-10-CM | POA: Diagnosis not present

## 2021-02-07 ENCOUNTER — Ambulatory Visit
Admission: RE | Admit: 2021-02-07 | Discharge: 2021-02-07 | Disposition: A | Discharge status: Home / Self Care | Payer: Federal, State, Local not specified - PPO | Source: Ambulatory Visit | Attending: Radiation Oncology | Admitting: Radiation Oncology

## 2021-02-07 DIAGNOSIS — C221 Intrahepatic bile duct carcinoma: Secondary | ICD-10-CM | POA: Diagnosis not present

## 2021-02-08 ENCOUNTER — Ambulatory Visit
Admission: RE | Admit: 2021-02-08 | Discharge: 2021-02-08 | Disposition: A | Discharge status: Home / Self Care | Payer: Federal, State, Local not specified - PPO | Source: Ambulatory Visit | Attending: Radiation Oncology | Admitting: Radiation Oncology

## 2021-02-08 ENCOUNTER — Other Ambulatory Visit: Payer: Self-pay

## 2021-02-08 DIAGNOSIS — C221 Intrahepatic bile duct carcinoma: Secondary | ICD-10-CM | POA: Diagnosis not present

## 2021-02-09 ENCOUNTER — Ambulatory Visit
Admission: RE | Admit: 2021-02-09 | Discharge: 2021-02-09 | Disposition: A | Discharge status: Home / Self Care | Payer: Federal, State, Local not specified - PPO | Source: Ambulatory Visit | Attending: Radiation Oncology | Admitting: Radiation Oncology

## 2021-02-09 DIAGNOSIS — C221 Intrahepatic bile duct carcinoma: Secondary | ICD-10-CM | POA: Diagnosis not present

## 2021-02-10 ENCOUNTER — Ambulatory Visit
Admission: RE | Admit: 2021-02-10 | Discharge: 2021-02-10 | Disposition: A | Discharge status: Home / Self Care | Payer: Federal, State, Local not specified - PPO | Source: Ambulatory Visit | Attending: Radiation Oncology | Admitting: Radiation Oncology

## 2021-02-10 ENCOUNTER — Other Ambulatory Visit: Payer: Self-pay

## 2021-02-10 DIAGNOSIS — C221 Intrahepatic bile duct carcinoma: Secondary | ICD-10-CM | POA: Diagnosis not present

## 2021-02-10 NOTE — Progress Notes (Signed)
Pt here for patient teaching.  Pt given Radiation and You booklet.  Reviewed areas of pertinence such as diarrhea, fatigue, hair loss, nausea and vomiting, sexual and fertility changes, skin changes, and urinary and bladder changes . Pt able to give teach back of to pat skin, use unscented/gentle soap, use baby wipes, have Imodium on hand, drink plenty of water, and sitz bath,avoid applying anything to skin within 4 hours of treatment. Pt verbalizes understanding of information given and will contact nursing with any questions or concerns.     Gloriajean Dell. Leonie Green, BSN

## 2021-02-14 ENCOUNTER — Ambulatory Visit: Payer: Federal, State, Local not specified - PPO | Admitting: Radiation Oncology

## 2021-02-14 ENCOUNTER — Ambulatory Visit
Admission: RE | Admit: 2021-02-14 | Discharge: 2021-02-14 | Disposition: A | Discharge status: Home / Self Care | Payer: Federal, State, Local not specified - PPO | Source: Ambulatory Visit | Attending: Radiation Oncology | Admitting: Radiation Oncology

## 2021-02-14 ENCOUNTER — Other Ambulatory Visit: Payer: Self-pay

## 2021-02-14 DIAGNOSIS — C221 Intrahepatic bile duct carcinoma: Secondary | ICD-10-CM | POA: Diagnosis not present

## 2021-02-15 ENCOUNTER — Ambulatory Visit
Admission: RE | Admit: 2021-02-15 | Discharge: 2021-02-15 | Disposition: A | Discharge status: Home / Self Care | Payer: Federal, State, Local not specified - PPO | Source: Ambulatory Visit | Attending: Radiation Oncology | Admitting: Radiation Oncology

## 2021-02-15 DIAGNOSIS — C221 Intrahepatic bile duct carcinoma: Secondary | ICD-10-CM | POA: Diagnosis not present

## 2021-02-16 ENCOUNTER — Ambulatory Visit
Admission: RE | Admit: 2021-02-16 | Discharge: 2021-02-16 | Disposition: A | Discharge status: Home / Self Care | Payer: Federal, State, Local not specified - PPO | Source: Ambulatory Visit | Attending: Radiation Oncology | Admitting: Radiation Oncology

## 2021-02-16 ENCOUNTER — Other Ambulatory Visit: Payer: Self-pay

## 2021-02-16 DIAGNOSIS — C221 Intrahepatic bile duct carcinoma: Secondary | ICD-10-CM | POA: Diagnosis not present

## 2021-02-17 ENCOUNTER — Encounter: Payer: Self-pay | Admitting: Hematology

## 2021-02-17 ENCOUNTER — Ambulatory Visit
Admission: RE | Admit: 2021-02-17 | Discharge: 2021-02-17 | Disposition: A | Discharge status: Home / Self Care | Payer: Federal, State, Local not specified - PPO | Source: Ambulatory Visit | Attending: Radiation Oncology | Admitting: Radiation Oncology

## 2021-02-17 ENCOUNTER — Inpatient Hospital Stay: Payer: Federal, State, Local not specified - PPO | Admitting: Hematology

## 2021-02-17 ENCOUNTER — Inpatient Hospital Stay: Payer: Federal, State, Local not specified - PPO

## 2021-02-17 VITALS — BP 136/79 | HR 85 | Temp 98.2°F | Resp 18 | Ht 70.0 in | Wt 173.1 lb

## 2021-02-17 DIAGNOSIS — C221 Intrahepatic bile duct carcinoma: Secondary | ICD-10-CM | POA: Diagnosis not present

## 2021-02-17 LAB — CBC WITH DIFFERENTIAL (CANCER CENTER ONLY)
Abs Immature Granulocytes: 0.01 10*3/uL (ref 0.00–0.07)
Basophils Absolute: 0 10*3/uL (ref 0.0–0.1)
Basophils Relative: 0 %
Eosinophils Absolute: 0.2 10*3/uL (ref 0.0–0.5)
Eosinophils Relative: 5 %
HCT: 34.3 % — ABNORMAL LOW (ref 39.0–52.0)
Hemoglobin: 11.9 g/dL — ABNORMAL LOW (ref 13.0–17.0)
Immature Granulocytes: 0 %
Lymphocytes Relative: 18 %
Lymphs Abs: 0.8 10*3/uL (ref 0.7–4.0)
MCH: 34.5 pg — ABNORMAL HIGH (ref 26.0–34.0)
MCHC: 34.7 g/dL (ref 30.0–36.0)
MCV: 99.4 fL (ref 80.0–100.0)
Monocytes Absolute: 0.4 10*3/uL (ref 0.1–1.0)
Monocytes Relative: 8 %
Neutro Abs: 3.2 10*3/uL (ref 1.7–7.7)
Neutrophils Relative %: 69 %
Platelet Count: 181 10*3/uL (ref 150–400)
RBC: 3.45 MIL/uL — ABNORMAL LOW (ref 4.22–5.81)
RDW: 21.1 % — ABNORMAL HIGH (ref 11.5–15.5)
WBC Count: 4.6 10*3/uL (ref 4.0–10.5)
nRBC: 0 % (ref 0.0–0.2)

## 2021-02-17 LAB — CMP (CANCER CENTER ONLY)
ALT: 32 U/L (ref 0–44)
AST: 34 U/L (ref 15–41)
Albumin: 3.2 g/dL — ABNORMAL LOW (ref 3.5–5.0)
Alkaline Phosphatase: 76 U/L (ref 38–126)
Anion gap: 5 (ref 5–15)
BUN: 14 mg/dL (ref 8–23)
CO2: 29 mmol/L (ref 22–32)
Calcium: 8.4 mg/dL — ABNORMAL LOW (ref 8.9–10.3)
Chloride: 107 mmol/L (ref 98–111)
Creatinine: 0.57 mg/dL — ABNORMAL LOW (ref 0.61–1.24)
GFR, Estimated: >60 mL/min (ref >60)
Glucose, Bld: 164 mg/dL — ABNORMAL HIGH (ref 70–99)
Potassium: 3.4 mmol/L — ABNORMAL LOW (ref 3.5–5.1)
Sodium: 141 mmol/L (ref 135–145)
Total Bilirubin: 0.5 mg/dL (ref 0.3–1.2)
Total Protein: 5.4 g/dL — ABNORMAL LOW (ref 6.5–8.1)

## 2021-02-17 NOTE — Progress Notes (Signed)
[facility]   [phone] Fax:([phone]) [phone]   Clinic Follow up Note   Patient Care Team: Hoyt Koch, MD as PCP - General (Internal Medicine) Deneise Lever, MD as Consulting Physician (Pulmonary Disease) Dwan Bolt, MD as Consulting Physician (General Surgery) Kyung Rudd, MD as Consulting Physician (Radiation Oncology) [name], MD as Consulting Physician (Hematology) [name], MD as Consulting Physician (Hematology) Carol Ada, MD as Consulting Physician (Gastroenterology)  Date of Service:  02/17/2021  CHIEF COMPLAINT: f/u of extrahepatic cholangiocarcinoma  CURRENT THERAPY:  Adjuvant Xeloda 2000 mg q12h on days 1 - 14, q21 days. Plan to begin concurrent radiation after 3 cycles of Xeloda. Dose reduced to 1500 mg BID with cycle 3 due to hand/foot syndrome  ASSESSMENT & PLAN:  LYDELL [name] is a 65 y.o. male with   1.  Extrahepatic cholangiocarcinoma, pT1cN0M0, stage I -presented to ED on 09/22/20 with obstructive jaundice and abdominal pain; bilirubin of 15   -Abdomen MRI on 09/23/20 revealed obstructing soft tissue mass in proximal common bile duct. ERCP with bile duct brushing on 09/23/20 under Dr. Benson Norway confirmed adenocarcinoma.  He is status post CBD stent placement -PET 10/10/20 showed no evidence of nodal or metastatic disease. -baseline CEA and CA 19-9 were normal. -S/p whipple on 10/31/20 under Dr. Zenia Resides. Pathology showed: ductal adenocarcinoma, 1.6 cm, involving ampulla of Vater and periductal connective tissue; 9 negative lymph nodes (0/9). Hepatic duct margin was positive.  -He began adjuvant Xeloda 2000mg /m2 q12h for day 1-14 evey 21 days on 12/05/20. Plan to give for 6 months, per BILCAP trial data.  -we decreased his dose to 1500mg  BID with cycle 3 due to hand-foot syndrome -He began radiation therapy on 02/06/21. He is now taking Xeloda only on radiation days. He is tolerating well overall. Will continue   2. Symptom  Management: Stomach cramps/gas, diarrhea, skin toxicity -he is having stomach cramps related to excessive gas. -he is also having diarrhea 4-6 times a day. I advised him to use imodium, especially if he has at least 3 BM's a day. -he is having some skin cracking from Xeloda. He endorses using lotion to prevent breakdown.   3.  History of alcohol addiction, heavy smoking  -He was alcoholic, but has stopped completely.  He is still seeing psychiatrist but no longer requires medication for cravings.  -he is a heavy smoker.  Has tried to switch to vaping but ultimately returned to smoking -Continue discussing smoking cessation     PLAN: -continue Xeloda 1500 mg q12hr, M-F only with radiation   -can hold on days he does not have radiation (such as on 02/20/21) -continue daily radiation -lab and f/u in 2, 4 weeks   No problem-specific Assessment & Plan notes found for this encounter.   SUMMARY OF ONCOLOGIC HISTORY: Oncology History Overview Note  Cancer Staging Cholangiocarcinoma [facility]) Staging form: Distal Bile Duct, AJCC 8th Edition - Pathologic stage from 10/31/2020: Stage I (pT1, pN0, cM0) - Signed by [name], MD on 11/22/2020    Cholangiocarcinoma ([place])  09/22/2020 Imaging   US Abdomen  IMPRESSION: Intrahepatic biliary ductal dilation and dilated proximal common bile duct. Findings are consistent with biliary obstruction, possibly due to a common bile duct mass, incompletely evaluated by ultrasound. Recommend multiphase CT or MRCP for further evaluation.   Mildly distended gallbladder filled with material of varying echogenicity, likely sludge and tiny stones. In the absence of leukocytosis and right upper quadrant pain this is unlikely to represent cholecystitis.  Coarsened liver echogenicity with somewhat nodular left hepatic lobe, compatible with chronic liver disease.   09/22/2020 Imaging   CT AP  IMPRESSION: 1. Question bibasilar pulmonary fibrosis. 2. Homogeneous  hyperdensity fills the gallbladder lumen - likely represents gallbladder sludge. Intra and extrahepatic biliary ductal dilatation with no main pancreatic duct dilatation. Findings are consistent with biliary obstruction, possibly due to a common bile duct mass which is incompletely evaluated on this single phase portal venous study. Recommend MRCP for further evaluation. 3. Nonspecific perirectal fat stranding with under distension of the rectum. 4. Aortic Atherosclerosis (ICD10-I70.0).   09/23/2020 Imaging   MRI Abdomen MRCP  IMPRESSION: 1. Obstructing soft tissue mass in the proximal common bile duct at or adjacent to the junction with the cystic duct, associated with moderate to severe intrahepatic biliary ductal dilatation indicating obstruction, as discussed above. 2. Biliary sludge filling the gallbladder lumen.   09/23/2020 Procedure   ERCP, Dr. Benson Norway  Impression: - The major papilla appeared normal. - A single localized biliary stricture was found in the common bile duct. The stricture was malignant appearing. - The left and right hepatic ducts and all intrahepatic branches and common bile duct were moderately dilated, with a mass causing an obstruction. - A biliary sphincterotomy was performed. - Cells for cytology obtained in the upper third of the main bile duct. - One plastic biliary stent was placed into the common bile duct.   09/23/2020 Pathology Results   A. BILIARY, BRUSHING:   FINAL MICROSCOPIC DIAGNOSIS:  - Malignant cells consistent with adenocarcinoma    10/09/2020 Initial Diagnosis   Cholangiocarcinoma ([place])   10/10/2020 PET scan   IMPRESSION: 1. No evidence of metastatic disease. Common bile duct mass, better seen and described on MR abdomen 09/23/2020. 2. Common bile duct stent in place with persistent biliary ductal dilatation and associated pneumobilia. 3. Tiny left renal stone. 4. Bladder wall thickening. 5. Aortic atherosclerosis (ICD10-I70.0).  Coronary artery calcification. 6.  Emphysema (ICD10-J43.9).   10/10/2020 Imaging   CT AP  IMPRESSION: No suspicious liver lesions. Replaced right hepatic artery arising from the SMA.   Common bile duct tumor appears unchanged compared to prior CT. Common bile duct stent place.   Findings of fibrotic interstitial lung disease in the partially visualized lower chest, could be further evaluated with dedicated high-resolution chest CT.   10/31/2020 Cancer Staging   Staging form: Distal Bile Duct, AJCC 8th Edition - Pathologic stage from 10/31/2020: Stage I (pT1, pN0, cM0) - Signed by [name], MD on 11/22/2020 Stage prefix: Initial diagnosis Total positive nodes: 0 Residual tumor (R): R1 - Microscopic    10/31/2020 Surgery   Whipple Procedure, Dr. Michaelle Birks   10/31/2020 Pathology Results   FINAL MICROSCOPIC DIAGNOSIS:   A. COMMON HEPATIC DUCT MARGIN:  - Adenocarcinoma involving bile duct.   B. NEW COMMON HEPATIC DUCT MARGIN:  - Atypical glands consistent with adenocarcinoma.   C. RIGHT HEPATIC DUCT MARGIN:  - Connective tissue with glands showing cautery artifact.  - No diagnostic malignancy identified.   D. GALLBLADDER, CHOLECYSTECTOMY:  - Benign gallbladder.  - Benign cystic duct lymph node.   E. LYMPH NODE, STATION 8, EXCISION:  - Two lymph nodes negative for metastatic carcinoma (0/2).   F. WHIPPLE PROCEDURE:  - Ductal adenocarcinoma, 1.6 cm.  - Carcinoma involves ampulla of Vater and periductal connective tissue.  - Perineural invasion.  - Surgical margins negative for carcinoma.  - Six lymph nodes negative for metastatic carcinoma (0/6).  - See oncology table.  G. LYMPH NODE, PORTAL, EXCISION:  - One lymph node negative for metastatic carcinoma (0/1).    10/31/2020 Cancer Staging   Staging form: Distal Bile Duct, AJCC 8th Edition - Pathologic stage from 10/31/2020: Stage I (pT1, pN0, cM0) - Signed by [name], MD on 11/23/2020 Total positive nodes:  0 Residual tumor (R): R1 - Microscopic       INTERVAL HISTORY:  [name] [name] is here for a follow up of extrahepatic cholangiocarcinoma. He was last seen by me on 01/30/21. He presents to the clinic alone. He reports he is having stomach cramps from gas, as well as diarrhea 4-6 times a day.   All other systems were reviewed with the patient and are negative.  MEDICAL HISTORY:  Past Medical History:  Diagnosis Date   Alcoholism ([place])    Anxiety    Bipolar disorder ([place])    Cancer ([place])    COPD (chronic obstructive pulmonary disease) ([place])    Depression    " post traumatic stress disorder" sees MD in [place] every 3-6 months.   Diabetes mellitus without complication ([facility])    Dysrhythmia    hx. Bundle branch block- saw Dr. Kathlee Nations   GERD (gastroesophageal reflux disease)    Hypertension    Irregular heart rate    Sleep apnea    cpap used with nose clip- Dr. Baird Lyons follows   Substance abuse [facility])    "tends to self medicate(Rx. meds or Alcohol) trying to get relief from "PSTD"    SURGICAL HISTORY: Past Surgical History:  Procedure Laterality Date   BILIARY BRUSHING  09/23/2020   Procedure: BILIARY BRUSHING;  Surgeon: Carol Ada, MD;  Location: [facility];  Service: Endoscopy;;   BILIARY STENT PLACEMENT N/A 09/23/2020   Procedure: BILIARY STENT PLACEMENT;  Surgeon: Carol Ada, MD;  Location: [facility];  Service: Endoscopy;  Laterality: N/A;   COLONOSCOPY WITH PROPOFOL N/A 01/19/2014   Procedure: COLONOSCOPY WITH PROPOFOL;  Surgeon: Juanita Craver, MD;  Location: [facility];  Service: Endoscopy;  Laterality: N/A;   ERCP N/A 09/23/2020   Procedure: ENDOSCOPIC RETROGRADE CHOLANGIOPANCREATOGRAPHY (ERCP);  Surgeon: Carol Ada, MD;  Location: [facility];  Service: Endoscopy;  Laterality: N/A;   LAPAROSCOPY N/A 10/31/2020   Procedure: STAGING LAPAROSCOPY;  Surgeon: Dwan Bolt, MD;  Location: [facility];  Service: General;  Laterality: N/A;  GEN  AND TAP BLOCK   SPHINCTEROTOMY  09/23/2020   Procedure: Joan [facility]ans;  Surgeon: Carol Ada, MD;  Location: [facility];  Service: Endoscopy;;   WHIPPLE PROCEDURE N/A 10/31/2020   Procedure: WHIPPLE PROCEDURE, INTRAOPERATIVE ULTRASOUND;  Surgeon: Dwan Bolt, MD;  Location: [facility];  Service: General;  Laterality: N/A;   WISDOM TOOTH EXTRACTION     65 years old    I have reviewed the social history and family history with the patient and they are unchanged from previous note.  ALLERGIES:  has No Known Allergies.  MEDICATIONS:  Current Outpatient Medications  Medication Sig Dispense Refill   acetaminophen (TYLENOL) 325 MG tablet Take 2 tablets (650 mg total) by mouth every 6 (six) hours as needed for mild pain.     albuterol (VENTOLIN HFA) 108 (90 Base) MCG/ACT inhaler Inhale 2 puffs into the lungs every 6 (six) hours as needed for wheezing or shortness of breath. 8 g 12   Ascorbic Acid (VITAMIN C) 1000 MG tablet Take 1,000 mg by mouth daily.     aspirin EC 81 MG tablet Take 81 mg by mouth daily. Swallow whole.  busPIRone (BUSPAR) 5 MG tablet Take 5 mg by mouth at bedtime.     capecitabine (XELODA) 500 MG tablet Take 3 tabs every 12 hours on days of radiation, Monday through Friday, and off on weekends 90 tablet 1   Cholecalciferol (VITAMIN D) 2000 UNITS tablet Take 2,000 Units by mouth daily.     CHROMIUM PO Take 1 tablet by mouth daily.     clonazePAM (KLONOPIN) 0.5 MG tablet TAKE 1 TABLET BY MOUTH TWICE DAILY AS NEEDED (Patient taking differently: Take 0.5 mg by mouth 2 (two) times daily as needed for anxiety.) 60 tablet 5   cloNIDine (CATAPRES) 0.1 MG tablet Take 0.1 mg by mouth 2 (two) times daily. (Patient not taking: Reported on 10/20/2020)     docusate sodium (COLACE) 100 MG capsule Take 1 capsule (100 mg total) by mouth 2 (two) times daily. 60 capsule 0   doxylamine, Sleep, (UNISOM) 25 MG tablet Take 25 mg by mouth at bedtime as needed for sleep.     ferrous sulfate 324 MG  TBEC Take 324 mg by mouth daily with breakfast.     icosapent Ethyl (VASCEPA) 1 g capsule TAKE 2 CAPSULES(2 GRAMS) BY MOUTH TWICE DAILY 360 capsule 1   magic mouthwash (multi-ingredient) oral suspension Swish, gargle, and spit 5-10 mls for 1 minute. Repeat every 6 hours as needed. May swallow if throat involved. 480 mL 1   magnesium gluconate (MAGONATE) 500 MG tablet Take 500 mg by mouth daily.     metFORMIN (GLUCOPHAGE) 1000 MG tablet TAKE 1 TABLET(1000 MG) BY MOUTH TWICE DAILY WITH A MEAL 180 tablet 3   Multiple Vitamin (MULTIVITAMIN WITH MINERALS) TABS tablet Take 1 tablet by mouth daily.     PARoxetine (PAXIL) 30 MG tablet Take 60 mg by mouth at bedtime.     Potassium 99 MG TABS Take 99 mg by mouth daily.     QUEtiapine (SEROQUEL) 50 MG tablet Take 50 mg by mouth at bedtime.     tiaGABine (GABITRIL) 4 MG tablet Take 8 mg by mouth at bedtime.     vitamin B-12 (CYANOCOBALAMIN) 500 MCG tablet Take 500 mcg by mouth daily before supper.      No current facility-administered medications for this visit.    PHYSICAL EXAMINATION: ECOG PERFORMANCE STATUS: 1 - Symptomatic but completely ambulatory  Vitals:   02/17/21 1128  BP: 136/79  Pulse: 85  Resp: 18  Temp: 98.2 F (36.8 C)  SpO2: 97%   Wt Readings from Last 3 Encounters:  02/17/21 173 lb 1.6 oz (78.5 kg)  01/30/21 174 lb 1.6 oz (79 kg)  01/09/21 177 lb 9 oz (80.5 kg)     GENERAL:alert, no distress and comfortable SKIN: skin color normal, no rashes or significant lesions EYES: normal, Conjunctiva are pink and non-injected, sclera clear  NEURO: alert & oriented x 3 with fluent speech  LABORATORY DATA:  I have reviewed the data as listed CBC Latest Ref Rng & Units 02/17/2021 01/30/2021 01/09/2021  WBC 4.0 - 10.5 K/uL 4.6 5.9 4.9  Hemoglobin 13.0 - 17.0 g/dL 11.9(L) 12.2(L) 11.9(L)  Hematocrit 39.0 - 52.0 % 34.3(L) 35.1(L) 33.4(L)  Platelets 150 - 400 K/uL 181 226 177     CMP Latest Ref Rng & Units 02/17/2021 01/30/2021  01/09/2021  Glucose 70 - 99 mg/dL 164(H) 136(H) 111(H)  BUN 8 - 23 mg/dL 14 16 18   Creatinine 0.61 - 1.24 mg/dL 0.57(L) 0.70 0.77  Sodium 135 - 145 mmol/L 141 140 140  Potassium 3.5 -  5.1 mmol/L 3.4(L) 4.0 4.2  Chloride 98 - 111 mmol/L 107 109 107  CO2 22 - 32 mmol/L 29 24 26   Calcium 8.9 - 10.3 mg/dL 8.4(L) 8.2(L) 8.7(L)  Total Protein 6.5 - 8.1 g/dL 5.4(L) 5.8(L) 6.3(L)  Total Bilirubin 0.3 - 1.2 mg/dL 0.5 0.5 0.3  Alkaline Phos 38 - 126 U/L 76 64 61  AST 15 - 41 U/L 34 22 17  ALT 0 - 44 U/L 32 18 14      RADIOGRAPHIC STUDIES: I have personally reviewed the radiological images as listed and agreed with the findings in the report. No results found.    No orders of the defined types were placed in this encounter.  All questions were answered. The patient knows to call the clinic with any problems, questions or concerns. No barriers to learning was detected. The total time spent in the appointment was 30 minutes.     [name], MD 02/17/2021   I, Wilburn Mylar, am acting as scribe for [name], MD.   I have reviewed the above documentation for accuracy and completeness, and I agree with the above.

## 2021-02-18 LAB — CANCER ANTIGEN 19-9: CA 19-9: 19 U/mL (ref 0–35)

## 2021-02-21 ENCOUNTER — Other Ambulatory Visit: Payer: Self-pay

## 2021-02-21 ENCOUNTER — Ambulatory Visit
Admission: RE | Admit: 2021-02-21 | Discharge: 2021-02-21 | Disposition: A | Discharge status: Home / Self Care | Payer: Federal, State, Local not specified - PPO | Source: Ambulatory Visit | Attending: Radiation Oncology | Admitting: Radiation Oncology

## 2021-02-21 DIAGNOSIS — Z510 Encounter for antineoplastic radiation therapy: Secondary | ICD-10-CM | POA: Diagnosis present

## 2021-02-21 DIAGNOSIS — I10 Essential (primary) hypertension: Secondary | ICD-10-CM | POA: Insufficient documentation

## 2021-02-21 DIAGNOSIS — E119 Type 2 diabetes mellitus without complications: Secondary | ICD-10-CM | POA: Insufficient documentation

## 2021-02-21 DIAGNOSIS — C221 Intrahepatic bile duct carcinoma: Secondary | ICD-10-CM | POA: Insufficient documentation

## 2021-02-22 ENCOUNTER — Ambulatory Visit
Admission: RE | Admit: 2021-02-22 | Discharge: 2021-02-22 | Disposition: A | Discharge status: Home / Self Care | Payer: Federal, State, Local not specified - PPO | Source: Ambulatory Visit | Attending: Radiation Oncology | Admitting: Radiation Oncology

## 2021-02-22 DIAGNOSIS — C221 Intrahepatic bile duct carcinoma: Secondary | ICD-10-CM | POA: Diagnosis not present

## 2021-02-23 ENCOUNTER — Ambulatory Visit
Admission: RE | Admit: 2021-02-23 | Discharge: 2021-02-23 | Disposition: A | Discharge status: Home / Self Care | Payer: Federal, State, Local not specified - PPO | Source: Ambulatory Visit | Attending: Radiation Oncology | Admitting: Radiation Oncology

## 2021-02-23 ENCOUNTER — Other Ambulatory Visit: Payer: Self-pay

## 2021-02-23 DIAGNOSIS — C221 Intrahepatic bile duct carcinoma: Secondary | ICD-10-CM | POA: Diagnosis not present

## 2021-02-24 ENCOUNTER — Ambulatory Visit
Admission: RE | Admit: 2021-02-24 | Discharge: 2021-02-24 | Disposition: A | Discharge status: Home / Self Care | Payer: Federal, State, Local not specified - PPO | Source: Ambulatory Visit | Attending: Radiation Oncology | Admitting: Radiation Oncology

## 2021-02-24 DIAGNOSIS — C221 Intrahepatic bile duct carcinoma: Secondary | ICD-10-CM | POA: Diagnosis not present

## 2021-02-27 ENCOUNTER — Inpatient Hospital Stay: Payer: Federal, State, Local not specified - PPO | Admitting: Hematology

## 2021-02-27 ENCOUNTER — Other Ambulatory Visit: Payer: Self-pay

## 2021-02-27 ENCOUNTER — Ambulatory Visit
Admission: RE | Admit: 2021-02-27 | Discharge: 2021-02-27 | Disposition: A | Discharge status: Home / Self Care | Payer: Federal, State, Local not specified - PPO | Source: Ambulatory Visit | Attending: Radiation Oncology | Admitting: Radiation Oncology

## 2021-02-27 ENCOUNTER — Encounter: Payer: Self-pay | Admitting: Hematology

## 2021-02-27 ENCOUNTER — Inpatient Hospital Stay: Payer: Federal, State, Local not specified - PPO

## 2021-02-27 VITALS — BP 137/90 | HR 82 | Temp 98.0°F | Resp 18 | Ht 70.0 in | Wt 168.7 lb

## 2021-02-27 DIAGNOSIS — J439 Emphysema, unspecified: Secondary | ICD-10-CM | POA: Insufficient documentation

## 2021-02-27 DIAGNOSIS — F17210 Nicotine dependence, cigarettes, uncomplicated: Secondary | ICD-10-CM | POA: Insufficient documentation

## 2021-02-27 DIAGNOSIS — I10 Essential (primary) hypertension: Secondary | ICD-10-CM | POA: Insufficient documentation

## 2021-02-27 DIAGNOSIS — E119 Type 2 diabetes mellitus without complications: Secondary | ICD-10-CM | POA: Insufficient documentation

## 2021-02-27 DIAGNOSIS — R197 Diarrhea, unspecified: Secondary | ICD-10-CM | POA: Insufficient documentation

## 2021-02-27 DIAGNOSIS — I700 Atherosclerosis of aorta: Secondary | ICD-10-CM | POA: Insufficient documentation

## 2021-02-27 DIAGNOSIS — C221 Intrahepatic bile duct carcinoma: Secondary | ICD-10-CM

## 2021-02-27 LAB — CBC WITH DIFFERENTIAL (CANCER CENTER ONLY)
Abs Immature Granulocytes: 0.02 10*3/uL (ref 0.00–0.07)
Basophils Absolute: 0 10*3/uL (ref 0.0–0.1)
Basophils Relative: 1 %
Eosinophils Absolute: 0.3 10*3/uL (ref 0.0–0.5)
Eosinophils Relative: 4 %
HCT: 35.1 % — ABNORMAL LOW (ref 39.0–52.0)
Hemoglobin: 12.3 g/dL — ABNORMAL LOW (ref 13.0–17.0)
Immature Granulocytes: 0 %
Lymphocytes Relative: 10 %
Lymphs Abs: 0.6 10*3/uL — ABNORMAL LOW (ref 0.7–4.0)
MCH: 35.3 pg — ABNORMAL HIGH (ref 26.0–34.0)
MCHC: 35 g/dL (ref 30.0–36.0)
MCV: 100.9 fL — ABNORMAL HIGH (ref 80.0–100.0)
Monocytes Absolute: 0.4 10*3/uL (ref 0.1–1.0)
Monocytes Relative: 6 %
Neutro Abs: 4.7 10*3/uL (ref 1.7–7.7)
Neutrophils Relative %: 79 %
Platelet Count: 205 10*3/uL (ref 150–400)
RBC: 3.48 MIL/uL — ABNORMAL LOW (ref 4.22–5.81)
RDW: 20.3 % — ABNORMAL HIGH (ref 11.5–15.5)
WBC Count: 5.9 10*3/uL (ref 4.0–10.5)
nRBC: 0 % (ref 0.0–0.2)

## 2021-02-27 LAB — CMP (CANCER CENTER ONLY)
ALT: 20 U/L (ref 0–44)
AST: 24 U/L (ref 15–41)
Albumin: 3.3 g/dL — ABNORMAL LOW (ref 3.5–5.0)
Alkaline Phosphatase: 72 U/L (ref 38–126)
Anion gap: 5 (ref 5–15)
BUN: 9 mg/dL (ref 8–23)
CO2: 29 mmol/L (ref 22–32)
Calcium: 8.5 mg/dL — ABNORMAL LOW (ref 8.9–10.3)
Chloride: 106 mmol/L (ref 98–111)
Creatinine: 0.59 mg/dL — ABNORMAL LOW (ref 0.61–1.24)
GFR, Estimated: >60 mL/min (ref >60)
Glucose, Bld: 113 mg/dL — ABNORMAL HIGH (ref 70–99)
Potassium: 3.6 mmol/L (ref 3.5–5.1)
Sodium: 140 mmol/L (ref 135–145)
Total Bilirubin: 0.5 mg/dL (ref 0.3–1.2)
Total Protein: 5.6 g/dL — ABNORMAL LOW (ref 6.5–8.1)

## 2021-02-27 NOTE — Progress Notes (Signed)
[place]   [phone] [phone]   Clinic Follow up Note   Patient Care Team: Hoyt Koch, MD as PCP - General (Internal Medicine) Deneise Lever, MD as Consulting Physician (Pulmonary Disease) Dwan Bolt, MD as Consulting Physician (General Surgery) Kyung Rudd, MD as Consulting Physician (Radiation Oncology) [name], MD as Consulting Physician (Hematology) [name], MD as Consulting Physician (Hematology) Carol Ada, MD as Consulting Physician (Gastroenterology)  Date of Service:  02/27/2021  CHIEF COMPLAINT: f/u of extrahepatic cholangiocarcinoma  CURRENT THERAPY:  Adjuvant Xeloda 2000 mg q12h on days 1 - 14, q21 days, started 12/05/20. Dose reduced to 1500 mg BID with cycle 3 due to hand/foot syndrome -concurrent radiation added after 3 cycles of Xeloda.   ASSESSMENT & PLAN:  [name] [name] is a 66 y.o. male with   1. Extrahepatic cholangiocarcinoma, pT1cN0M0, stage I -presented to ED on 09/22/20 with obstructive jaundice and abdominal pain; bilirubin of 15   -Abdomen MRI on 09/23/20 revealed obstructing soft tissue mass in proximal common bile duct. ERCP with bile duct brushing on 09/23/20 under Dr. Benson Norway confirmed adenocarcinoma.  He is status post CBD stent placement -PET 10/10/20 showed no evidence of nodal or metastatic disease. -baseline CEA and CA 19-9 were normal. -S/p whipple on 10/31/20 under Dr. Zenia Resides. Pathology showed: ductal adenocarcinoma, 1.6 cm, involving ampulla of Vater and periductal connective tissue; 9 negative lymph nodes (0/9). Hepatic duct margin was positive.  -He began adjuvant Xeloda 2000mg /m2 q12h for day 1-14 evey 21 days on 12/05/20. Plan to give for 6 months, per BILCAP trial data. Dose decreased to 1500mg  BID with cycle 3 due to hand-foot syndrome. -He began radiation therapy on 02/06/21. He is now taking Xeloda only on radiation days. He is tolerating well overall with mild skin peeling.  Will  continue  -will continue Xeloda for a total of 6 months  -he is scheduled for f/u with Dr. Zenia Resides on 03/01/21.   2. Symptom Management: Stomach cramps/gas, diarrhea, skin toxicity, dry eyes -he is having stomach cramps related to excessive gas. -he is also having diarrhea 4-6 times a day. I advised him to use imodium, especially if he has at least 3 BM's a day. -he is having some skin cracking from Xeloda. He endorses using lotion to prevent breakdown. -He is wearing blue light filtering glasses, which provide relief for his eye irritation.   3. History of alcohol addiction, heavy smoking  -He was alcoholic, but has stopped completely.  He is still seeing psychiatrist but no longer requires medication for cravings.  -he is a heavy smoker.  Has tried to switch to vaping but ultimately returned to smoking -Continue discussing smoking cessation     PLAN: -continue Xeloda 1500 mg q12hr, M-F only with radiation  -continue daily radiation -lab and f/u in 2, 4 weeks   No problem-specific Assessment & Plan notes found for this encounter.   SUMMARY OF ONCOLOGIC HISTORY: Oncology History Overview Note  Cancer Staging Cholangiocarcinoma [facility]) Staging form: Distal Bile Duct, AJCC 8th Edition - Pathologic stage from 10/31/2020: Stage I (pT1, pN0, cM0) - Signed by [name], MD on 11/22/2020    Cholangiocarcinoma ([place])  09/22/2020 Imaging   US Abdomen  IMPRESSION: Intrahepatic biliary ductal dilation and dilated proximal common bile duct. Findings are consistent with biliary obstruction, possibly due to a common bile duct mass, incompletely evaluated by ultrasound. Recommend multiphase CT or MRCP for further evaluation.   Mildly distended gallbladder filled with material  of varying echogenicity, likely sludge and tiny stones. In the absence of leukocytosis and right upper quadrant pain this is unlikely to represent cholecystitis.   Coarsened liver echogenicity with somewhat nodular left  hepatic lobe, compatible with chronic liver disease.   09/22/2020 Imaging   CT AP  IMPRESSION: 1. Question bibasilar pulmonary fibrosis. 2. Homogeneous hyperdensity fills the gallbladder lumen - likely represents gallbladder sludge. Intra and extrahepatic biliary ductal dilatation with no main pancreatic duct dilatation. Findings are consistent with biliary obstruction, possibly due to a common bile duct mass which is incompletely evaluated on this single phase portal venous study. Recommend MRCP for further evaluation. 3. Nonspecific perirectal fat stranding with under distension of the rectum. 4. Aortic Atherosclerosis (ICD10-I70.0).   09/23/2020 Imaging   MRI Abdomen MRCP  IMPRESSION: 1. Obstructing soft tissue mass in the proximal common bile duct at or adjacent to the junction with the cystic duct, associated with moderate to severe intrahepatic biliary ductal dilatation indicating obstruction, as discussed above. 2. Biliary sludge filling the gallbladder lumen.   09/23/2020 Procedure   ERCP, Dr. Benson Norway  Impression: - The major papilla appeared normal. - A single localized biliary stricture was found in the common bile duct. The stricture was malignant appearing. - The left and right hepatic ducts and all intrahepatic branches and common bile duct were moderately dilated, with a mass causing an obstruction. - A biliary sphincterotomy was performed. - Cells for cytology obtained in the upper third of the main bile duct. - One plastic biliary stent was placed into the common bile duct.   09/23/2020 Pathology Results   A. BILIARY, BRUSHING:   FINAL MICROSCOPIC DIAGNOSIS:  - Malignant cells consistent with adenocarcinoma    10/09/2020 Initial Diagnosis   Cholangiocarcinoma ([place])   10/10/2020 PET scan   IMPRESSION: 1. No evidence of metastatic disease. Common bile duct mass, better seen and described on MR abdomen 09/23/2020. 2. Common bile duct stent in place with persistent  biliary ductal dilatation and associated pneumobilia. 3. Tiny left renal stone. 4. Bladder wall thickening. 5. Aortic atherosclerosis (ICD10-I70.0). Coronary artery calcification. 6.  Emphysema (ICD10-J43.9).   10/10/2020 Imaging   CT AP  IMPRESSION: No suspicious liver lesions. Replaced right hepatic artery arising from the SMA.   Common bile duct tumor appears unchanged compared to prior CT. Common bile duct stent place.   Findings of fibrotic interstitial lung disease in the partially visualized lower chest, could be further evaluated with dedicated high-resolution chest CT.   10/31/2020 Cancer Staging   Staging form: Distal Bile Duct, AJCC 8th Edition - Pathologic stage from 10/31/2020: Stage I (pT1, pN0, cM0) - Signed by [name], MD on 11/22/2020 Stage prefix: Initial diagnosis Total positive nodes: 0 Residual tumor (R): R1 - Microscopic    10/31/2020 Surgery   Whipple Procedure, Dr. Michaelle Birks   10/31/2020 Pathology Results   FINAL MICROSCOPIC DIAGNOSIS:   A. COMMON HEPATIC DUCT MARGIN:  - Adenocarcinoma involving bile duct.   B. NEW COMMON HEPATIC DUCT MARGIN:  - Atypical glands consistent with adenocarcinoma.   C. RIGHT HEPATIC DUCT MARGIN:  - Connective tissue with glands showing cautery artifact.  - No diagnostic malignancy identified.   D. GALLBLADDER, CHOLECYSTECTOMY:  - Benign gallbladder.  - Benign cystic duct lymph node.   E. LYMPH NODE, STATION 8, EXCISION:  - Two lymph nodes negative for metastatic carcinoma (0/2).   F. WHIPPLE PROCEDURE:  - Ductal adenocarcinoma, 1.6 cm.  - Carcinoma involves ampulla of Vater and periductal connective tissue.  -  Perineural invasion.  - Surgical margins negative for carcinoma.  - Six lymph nodes negative for metastatic carcinoma (0/6).  - See oncology table.   G. LYMPH NODE, PORTAL, EXCISION:  - One lymph node negative for metastatic carcinoma (0/1).    10/31/2020 Cancer Staging   Staging form: Distal Bile  Duct, AJCC 8th Edition - Pathologic stage from 10/31/2020: Stage I (pT1, pN0, cM0) - Signed by [name], MD on 11/23/2020 Total positive nodes: 0 Residual tumor (R): R1 - Microscopic       INTERVAL HISTORY:  [name] [name] is here for a follow up of extrahepatic cholangiocarcinoma. He was last seen by me on 02/17/21. He presents to the clinic accompanied by his wife. He reports the radiation is starting to "kick my butt." He reports some tenderness/pain to his stomach but denies skin change/issues. He does have some bowel urgency at times. His wife also reports he has an area of bulging skin that she is not sure if it is a hernia or not; he states it will go down when he lays down and pop out when he stands up. He notes pain to it occasionally. He also has dry, cracking skin to his hands. He endorses using neosporin to the open areas and lotion. He notes he has a good appetite but is eating less due to the pain/discomfort from radiation. His wife feels he is passing everything he intakes. He notes he has decreased his coffee intake due to the diarrhea, and also due to a change in taste. He reports some dry eye symptoms that were not relieved by eye drops. He notes his wife bought him some blue light filtering glasses, and these have helped.   All other systems were reviewed with the patient and are negative.  MEDICAL HISTORY:  Past Medical History:  Diagnosis Date   Alcoholism ([place])    Anxiety    Bipolar disorder ([place])    Cancer ([place])    COPD (chronic obstructive pulmonary disease) ([place])    Depression    " post traumatic stress disorder" sees MD in [place] every 3-6 months.   Diabetes mellitus without complication ([facility])    Dysrhythmia    hx. Bundle branch block- saw Dr. Kathlee Nations   GERD (gastroesophageal reflux disease)    Hypertension    Irregular heart rate    Sleep apnea    cpap used with nose clip- Dr. Baird Lyons follows   Substance abuse [name])    "tends to  self medicate(Rx. meds or Alcohol) trying to get relief from "PSTD"    SURGICAL HISTORY: Past Surgical History:  Procedure Laterality Date   BILIARY BRUSHING  09/23/2020   Procedure: BILIARY BRUSHING;  Surgeon: Carol Ada, MD;  Location: [facility];  Service: Endoscopy;;   BILIARY STENT PLACEMENT N/A 09/23/2020   Procedure: BILIARY STENT PLACEMENT;  Surgeon: Carol Ada, MD;  Location: [facility];  Service: Endoscopy;  Laterality: N/A;   COLONOSCOPY WITH PROPOFOL N/A 01/19/2014   Procedure: COLONOSCOPY WITH PROPOFOL;  Surgeon: Juanita Craver, MD;  Location: [facility];  Service: Endoscopy;  Laterality: N/A;   ERCP N/A 09/23/2020   Procedure: ENDOSCOPIC RETROGRADE CHOLANGIOPANCREATOGRAPHY (ERCP);  Surgeon: Carol Ada, MD;  Location: [facility];  Service: Endoscopy;  Laterality: N/A;   LAPAROSCOPY N/A 10/31/2020   Procedure: STAGING LAPAROSCOPY;  Surgeon: Dwan Bolt, MD;  Location: [facility];  Service: General;  Laterality: N/A;  GEN AND TAP BLOCK   SPHINCTEROTOMY  09/23/2020   Procedure: Joan Mayans;  Surgeon: Benson Norway,  Saralyn Pilar, MD;  Location: [facility];  Service: Endoscopy;;   WHIPPLE PROCEDURE N/A 10/31/2020   Procedure: WHIPPLE PROCEDURE, INTRAOPERATIVE ULTRASOUND;  Surgeon: Dwan Bolt, MD;  Location: [facility];  Service: General;  Laterality: N/A;   WISDOM TOOTH EXTRACTION     66 years old    I have reviewed the social history and family history with the patient and they are unchanged from previous note.  ALLERGIES:  has No Known Allergies.  MEDICATIONS:  Current Outpatient Medications  Medication Sig Dispense Refill   acetaminophen (TYLENOL) 325 MG tablet Take 2 tablets (650 mg total) by mouth every 6 (six) hours as needed for mild pain.     albuterol (VENTOLIN HFA) 108 (90 Base) MCG/ACT inhaler Inhale 2 puffs into the lungs every 6 (six) hours as needed for wheezing or shortness of breath. 8 g 12   Ascorbic Acid (VITAMIN C) 1000 MG tablet Take 1,000 mg by mouth  daily.     aspirin EC 81 MG tablet Take 81 mg by mouth daily. Swallow whole.     busPIRone (BUSPAR) 5 MG tablet Take 5 mg by mouth at bedtime.     capecitabine (XELODA) 500 MG tablet Take 3 tabs every 12 hours on days of radiation, Monday through Friday, and off on weekends 90 tablet 1   Cholecalciferol (VITAMIN D) 2000 UNITS tablet Take 2,000 Units by mouth daily.     CHROMIUM PO Take 1 tablet by mouth daily.     clonazePAM (KLONOPIN) 0.5 MG tablet TAKE 1 TABLET BY MOUTH TWICE DAILY AS NEEDED (Patient taking differently: Take 0.5 mg by mouth 2 (two) times daily as needed for anxiety.) 60 tablet 5   cloNIDine (CATAPRES) 0.1 MG tablet Take 0.1 mg by mouth 2 (two) times daily. (Patient not taking: Reported on 10/20/2020)     docusate sodium (COLACE) 100 MG capsule Take 1 capsule (100 mg total) by mouth 2 (two) times daily. 60 capsule 0   doxylamine, Sleep, (UNISOM) 25 MG tablet Take 25 mg by mouth at bedtime as needed for sleep.     ferrous sulfate 324 MG TBEC Take 324 mg by mouth daily with breakfast.     icosapent Ethyl (VASCEPA) 1 g capsule TAKE 2 CAPSULES(2 GRAMS) BY MOUTH TWICE DAILY 360 capsule 1   magic mouthwash (multi-ingredient) oral suspension Swish, gargle, and spit 5-10 mls for 1 minute. Repeat every 6 hours as needed. May swallow if throat involved. 480 mL 1   magnesium gluconate (MAGONATE) 500 MG tablet Take 500 mg by mouth daily.     metFORMIN (GLUCOPHAGE) 1000 MG tablet TAKE 1 TABLET(1000 MG) BY MOUTH TWICE DAILY WITH A MEAL 180 tablet 3   Multiple Vitamin (MULTIVITAMIN WITH MINERALS) TABS tablet Take 1 tablet by mouth daily.     PARoxetine (PAXIL) 30 MG tablet Take 60 mg by mouth at bedtime.     Potassium 99 MG TABS Take 99 mg by mouth daily.     QUEtiapine (SEROQUEL) 50 MG tablet Take 50 mg by mouth at bedtime.     tiaGABine (GABITRIL) 4 MG tablet Take 8 mg by mouth at bedtime.     vitamin B-12 (CYANOCOBALAMIN) 500 MCG tablet Take 500 mcg by mouth daily before supper.      No  current facility-administered medications for this visit.    PHYSICAL EXAMINATION: ECOG PERFORMANCE STATUS: 1 - Symptomatic but completely ambulatory  Vitals:   02/27/21 0941  BP: 137/90  Pulse: 82  Resp: 18  Temp: 98 F (  36.7 C)  SpO2: 98%   Wt Readings from Last 3 Encounters:  02/27/21 168 lb 11.2 oz (76.5 kg)  02/17/21 173 lb 1.6 oz (78.5 kg)  01/30/21 174 lb 1.6 oz (79 kg)     GENERAL:alert, no distress and comfortable SKIN: skin color normal, no rashes or significant lesions EYES: normal, Conjunctiva are pink and non-injected, sclera clear  NEURO: alert & oriented x 3 with fluent speech  LABORATORY DATA:  I have reviewed the data as listed CBC Latest Ref Rng & Units 02/27/2021 02/17/2021 01/30/2021  WBC 4.0 - 10.5 K/uL 5.9 4.6 5.9  Hemoglobin 13.0 - 17.0 g/dL 12.3(L) 11.9(L) 12.2(L)  Hematocrit 39.0 - 52.0 % 35.1(L) 34.3(L) 35.1(L)  Platelets 150 - 400 K/uL 205 181 226     CMP Latest Ref Rng & Units 02/27/2021 02/17/2021 01/30/2021  Glucose 70 - 99 mg/dL 113(H) 164(H) 136(H)  BUN 8 - 23 mg/dL 9 14 16   Creatinine 0.61 - 1.24 mg/dL 0.59(L) 0.57(L) 0.70  Sodium 135 - 145 mmol/L 140 141 140  Potassium 3.5 - 5.1 mmol/L 3.6 3.4(L) 4.0  Chloride 98 - 111 mmol/L 106 107 109  CO2 22 - 32 mmol/L 29 29 24   Calcium 8.9 - 10.3 mg/dL 8.5(L) 8.4(L) 8.2(L)  Total Protein 6.5 - 8.1 g/dL 5.6(L) 5.4(L) 5.8(L)  Total Bilirubin 0.3 - 1.2 mg/dL 0.5 0.5 0.5  Alkaline Phos 38 - 126 U/L 72 76 64  AST 15 - 41 U/L 24 34 22  ALT 0 - 44 U/L 20 32 18      RADIOGRAPHIC STUDIES: I have personally reviewed the radiological images as listed and agreed with the findings in the report. No results found.    No orders of the defined types were placed in this encounter.  All questions were answered. The patient knows to call the clinic with any problems, questions or concerns. No barriers to learning was detected.      [name], MD 02/27/2021   I, Wilburn Mylar, am acting as scribe  for [name], MD.   I have reviewed the above documentation for accuracy and completeness, and I agree with the above.

## 2021-02-28 ENCOUNTER — Other Ambulatory Visit: Payer: Self-pay | Admitting: Hematology

## 2021-02-28 ENCOUNTER — Ambulatory Visit
Admission: RE | Admit: 2021-02-28 | Discharge: 2021-02-28 | Disposition: A | Discharge status: Home / Self Care | Payer: Federal, State, Local not specified - PPO | Source: Ambulatory Visit | Attending: Radiation Oncology | Admitting: Radiation Oncology

## 2021-02-28 DIAGNOSIS — C221 Intrahepatic bile duct carcinoma: Secondary | ICD-10-CM

## 2021-03-01 ENCOUNTER — Ambulatory Visit
Admission: RE | Admit: 2021-03-01 | Discharge: 2021-03-01 | Disposition: A | Discharge status: Home / Self Care | Payer: Federal, State, Local not specified - PPO | Source: Ambulatory Visit | Attending: Radiation Oncology | Admitting: Radiation Oncology

## 2021-03-01 ENCOUNTER — Other Ambulatory Visit: Payer: Self-pay

## 2021-03-01 DIAGNOSIS — C221 Intrahepatic bile duct carcinoma: Secondary | ICD-10-CM | POA: Diagnosis not present

## 2021-03-02 ENCOUNTER — Ambulatory Visit
Admission: RE | Admit: 2021-03-02 | Discharge: 2021-03-02 | Disposition: A | Discharge status: Home / Self Care | Payer: Federal, State, Local not specified - PPO | Source: Ambulatory Visit | Attending: Radiation Oncology | Admitting: Radiation Oncology

## 2021-03-02 DIAGNOSIS — C221 Intrahepatic bile duct carcinoma: Secondary | ICD-10-CM | POA: Diagnosis not present

## 2021-03-03 ENCOUNTER — Ambulatory Visit
Admission: RE | Admit: 2021-03-03 | Discharge: 2021-03-03 | Disposition: A | Discharge status: Home / Self Care | Payer: Federal, State, Local not specified - PPO | Source: Ambulatory Visit | Attending: Radiation Oncology | Admitting: Radiation Oncology

## 2021-03-03 ENCOUNTER — Other Ambulatory Visit: Payer: Self-pay

## 2021-03-03 DIAGNOSIS — C221 Intrahepatic bile duct carcinoma: Secondary | ICD-10-CM | POA: Diagnosis not present

## 2021-03-06 ENCOUNTER — Ambulatory Visit
Admission: RE | Admit: 2021-03-06 | Discharge: 2021-03-06 | Disposition: A | Discharge status: Home / Self Care | Payer: Federal, State, Local not specified - PPO | Source: Ambulatory Visit | Attending: Radiation Oncology | Admitting: Radiation Oncology

## 2021-03-06 ENCOUNTER — Other Ambulatory Visit: Payer: Self-pay

## 2021-03-06 DIAGNOSIS — C221 Intrahepatic bile duct carcinoma: Secondary | ICD-10-CM | POA: Diagnosis not present

## 2021-03-07 ENCOUNTER — Ambulatory Visit
Admission: RE | Admit: 2021-03-07 | Discharge: 2021-03-07 | Disposition: A | Discharge status: Home / Self Care | Payer: Federal, State, Local not specified - PPO | Source: Ambulatory Visit | Attending: Radiation Oncology | Admitting: Radiation Oncology

## 2021-03-07 DIAGNOSIS — C221 Intrahepatic bile duct carcinoma: Secondary | ICD-10-CM | POA: Diagnosis not present

## 2021-03-08 ENCOUNTER — Ambulatory Visit
Admission: RE | Admit: 2021-03-08 | Discharge: 2021-03-08 | Disposition: A | Discharge status: Home / Self Care | Payer: Federal, State, Local not specified - PPO | Source: Ambulatory Visit | Attending: Radiation Oncology | Admitting: Radiation Oncology

## 2021-03-08 ENCOUNTER — Other Ambulatory Visit: Payer: Self-pay

## 2021-03-08 DIAGNOSIS — C221 Intrahepatic bile duct carcinoma: Secondary | ICD-10-CM | POA: Diagnosis not present

## 2021-03-09 ENCOUNTER — Ambulatory Visit
Admission: RE | Admit: 2021-03-09 | Discharge: 2021-03-09 | Disposition: A | Discharge status: Home / Self Care | Payer: Federal, State, Local not specified - PPO | Source: Ambulatory Visit | Attending: Radiation Oncology | Admitting: Radiation Oncology

## 2021-03-09 DIAGNOSIS — C221 Intrahepatic bile duct carcinoma: Secondary | ICD-10-CM | POA: Diagnosis not present

## 2021-03-10 ENCOUNTER — Ambulatory Visit
Admission: RE | Admit: 2021-03-10 | Discharge: 2021-03-10 | Disposition: A | Discharge status: Home / Self Care | Payer: Federal, State, Local not specified - PPO | Source: Ambulatory Visit | Attending: Radiation Oncology | Admitting: Radiation Oncology

## 2021-03-10 ENCOUNTER — Other Ambulatory Visit: Payer: Self-pay

## 2021-03-10 DIAGNOSIS — C221 Intrahepatic bile duct carcinoma: Secondary | ICD-10-CM | POA: Diagnosis not present

## 2021-03-13 ENCOUNTER — Ambulatory Visit: Payer: Federal, State, Local not specified - PPO | Admitting: Internal Medicine

## 2021-03-13 ENCOUNTER — Encounter: Payer: Self-pay | Admitting: Hematology

## 2021-03-13 ENCOUNTER — Inpatient Hospital Stay: Payer: Federal, State, Local not specified - PPO

## 2021-03-13 ENCOUNTER — Inpatient Hospital Stay: Payer: Federal, State, Local not specified - PPO | Admitting: Hematology

## 2021-03-13 ENCOUNTER — Other Ambulatory Visit: Payer: Self-pay

## 2021-03-13 ENCOUNTER — Ambulatory Visit
Admission: RE | Admit: 2021-03-13 | Discharge: 2021-03-13 | Disposition: A | Discharge status: Home / Self Care | Payer: Federal, State, Local not specified - PPO | Source: Ambulatory Visit | Attending: Radiation Oncology | Admitting: Radiation Oncology

## 2021-03-13 ENCOUNTER — Encounter: Payer: Self-pay | Admitting: Internal Medicine

## 2021-03-13 VITALS — BP 125/87 | HR 82 | Temp 98.2°F | Resp 17 | Wt 166.3 lb

## 2021-03-13 DIAGNOSIS — R42 Dizziness and giddiness: Secondary | ICD-10-CM | POA: Diagnosis not present

## 2021-03-13 DIAGNOSIS — C221 Intrahepatic bile duct carcinoma: Secondary | ICD-10-CM

## 2021-03-13 LAB — CBC WITH DIFFERENTIAL (CANCER CENTER ONLY)
Abs Immature Granulocytes: 0.03 10*3/uL (ref 0.00–0.07)
Basophils Absolute: 0 10*3/uL (ref 0.0–0.1)
Basophils Relative: 1 %
Eosinophils Absolute: 0.2 10*3/uL (ref 0.0–0.5)
Eosinophils Relative: 4 %
HCT: 34.3 % — ABNORMAL LOW (ref 39.0–52.0)
Hemoglobin: 12.2 g/dL — ABNORMAL LOW (ref 13.0–17.0)
Immature Granulocytes: 1 %
Lymphocytes Relative: 6 %
Lymphs Abs: 0.4 10*3/uL — ABNORMAL LOW (ref 0.7–4.0)
MCH: 37 pg — ABNORMAL HIGH (ref 26.0–34.0)
MCHC: 35.6 g/dL (ref 30.0–36.0)
MCV: 103.9 fL — ABNORMAL HIGH (ref 80.0–100.0)
Monocytes Absolute: 0.4 10*3/uL (ref 0.1–1.0)
Monocytes Relative: 7 %
Neutro Abs: 4.8 10*3/uL (ref 1.7–7.7)
Neutrophils Relative %: 81 %
Platelet Count: 178 10*3/uL (ref 150–400)
RBC: 3.3 MIL/uL — ABNORMAL LOW (ref 4.22–5.81)
RDW: 18.3 % — ABNORMAL HIGH (ref 11.5–15.5)
WBC Count: 5.9 10*3/uL (ref 4.0–10.5)
nRBC: 0 % (ref 0.0–0.2)

## 2021-03-13 LAB — CMP (CANCER CENTER ONLY)
ALT: 25 U/L (ref 0–44)
AST: 27 U/L (ref 15–41)
Albumin: 3.3 g/dL — ABNORMAL LOW (ref 3.5–5.0)
Alkaline Phosphatase: 80 U/L (ref 38–126)
Anion gap: 15 (ref 5–15)
BUN: 9 mg/dL (ref 8–23)
CO2: 28 mmol/L (ref 22–32)
Calcium: 9.1 mg/dL (ref 8.9–10.3)
Chloride: 100 mmol/L (ref 98–111)
Creatinine: 0.5 mg/dL — ABNORMAL LOW (ref 0.61–1.24)
GFR, Estimated: >60 mL/min (ref >60)
Glucose, Bld: 99 mg/dL (ref 70–99)
Potassium: 3.1 mmol/L — ABNORMAL LOW (ref 3.5–5.1)
Sodium: 143 mmol/L (ref 135–145)
Total Bilirubin: 0.2 mg/dL — ABNORMAL LOW (ref 0.3–1.2)
Total Protein: 5.8 g/dL — ABNORMAL LOW (ref 6.5–8.1)

## 2021-03-13 MED ORDER — CAPECITABINE 500 MG PO TABS: ORAL_TABLET | ORAL | 0 refills | Status: DC

## 2021-03-13 NOTE — Progress Notes (Signed)
° °  Subjective:   Patient ID: [name] [name], male    DOB: Feb 18, 1956, 66 y.o.   MRN: [id number]  HPI The patient is a 66 YO man coming in for vertigo. Started about 10 days ago and worse with head positions. Has cholangiocarcinoma and undergoing chemo and radiation therapy currently. No vision changes, numbness, weakness.   Review of Systems  Constitutional: Negative.   HENT: Negative.    Eyes: Negative.   Respiratory:  Negative for cough, chest tightness and shortness of breath.   Cardiovascular:  Negative for chest pain, palpitations and leg swelling.  Gastrointestinal:  Negative for abdominal distention, abdominal pain, constipation, diarrhea, nausea and vomiting.  Musculoskeletal: Negative.   Skin: Negative.   Neurological:  Positive for dizziness. Negative for syncope, facial asymmetry, speech difficulty, light-headedness, numbness and headaches.  Psychiatric/Behavioral: Negative.     Objective:  Physical Exam Constitutional:      Appearance: He is well-developed.     Comments: Significant weight loss in past few months  HENT:     Head: Normocephalic and atraumatic.     Comments:  both ears canal impacted with copious hard wax, examination post ear lavage canal is clear and no bleeding or complications noted. TM normal bilaterally. No sinus tenderness      Right Ear: Tympanic membrane normal.     Left Ear: Tympanic membrane normal.  Cardiovascular:     Rate and Rhythm: Normal rate and regular rhythm.  Pulmonary:     Effort: Pulmonary effort is normal. No respiratory distress.     Breath sounds: Normal breath sounds. No wheezing or rales.  Abdominal:     General: Bowel sounds are normal. There is no distension.     Palpations: Abdomen is soft.     Tenderness: There is no abdominal tenderness. There is no rebound.  Musculoskeletal:     Cervical back: Normal range of motion.  Skin:    General: Skin is warm and dry.  Neurological:     Mental Status: He is alert and  oriented to person, place, and time.     Coordination: Coordination normal.    Vitals:   03/13/21 1317  BP: 124/80  Pulse: 87  Resp: 18  SpO2: 98%  Weight: 166 lb 12.8 oz (75.7 kg)  Height: 5\' 10"  (1.778 m)    This visit occurred during the SARS-CoV-2 public health emergency.  Safety protocols were in place, including screening questions prior to the visit, additional usage of staff PPE, and extensive cleaning of exam room while observing appropriate contact time as indicated for disinfecting solutions.   Assessment & Plan:

## 2021-03-13 NOTE — Progress Notes (Signed)
[name]   [phone] [phone]   Clinic Follow up Note   Patient Care Team: Hoyt Koch, MD as PCP - General (Internal Medicine) Deneise Lever, MD as Consulting Physician (Pulmonary Disease) Dwan Bolt, MD as Consulting Physician (General Surgery) Kyung Rudd, MD as Consulting Physician (Radiation Oncology) [name], MD as Consulting Physician (Hematology) [name], MD as Consulting Physician (Hematology) Carol Ada, MD as Consulting Physician (Gastroenterology)  Date of Service:  03/13/2021  CHIEF COMPLAINT: f/u of extrahepatic cholangiocarcinoma  CURRENT THERAPY:  Adjuvant Xeloda 2000 mg q12h on days 1 - 14, q21 days, started 12/05/20. Dose reduced to 1500 mg BID with cycle 3 due to hand/foot syndrome -concurrent radiation added after 3 cycles of Xeloda.   ASSESSMENT & PLAN:  [name] [name] is a 66 y.o. male with   1. Extrahepatic cholangiocarcinoma, pT1cN0M0, stage I -presented to ED on 09/22/20 with obstructive jaundice and abdominal pain; bilirubin of 15 -Abdomen MRI on 09/23/20 revealed obstructing soft tissue mass in proximal common bile duct. ERCP with bile duct brushing on 09/23/20 under Dr. Benson Norway confirmed adenocarcinoma.  He is status post CBD stent placement -PET 10/10/20 showed no evidence of nodal or metastatic disease. -baseline CEA and CA 19-9 were normal. -S/p whipple on 10/31/20 under Dr. Zenia Resides. Pathology showed: ductal adenocarcinoma, 1.6 cm, involving ampulla of Vater and periductal connective tissue; 9 negative lymph nodes (0/9). Hepatic duct margin was positive.  -He began adjuvant Xeloda 2000mg /m2 q12h for day 1-14 evey 21 days on 12/05/20. Plan to give for 6 months, per BILCAP trial data. Dose decreased to 1500mg  BID with cycle 3 due to hand-foot syndrome. -He began radiation therapy on 02/06/21. He is now taking Xeloda only on radiation days. He is tolerating well overall with mild skin peeling.   -we  will give him a 2 week break following radiation, then restart Xeloda to complete a total of 6 months  -he last saw Dr. Zenia Resides on 03/01/21.   2. Symptom Management: Diarrhea, skin toxicity, eye sensitivity -his diarrhea has improved, and he is now having soft, loose bowel movements -he is having some skin cracking from Xeloda. He endorses using lotion to prevent breakdown. -He is wearing blue light filtering glasses, which provide relief for his eye irritation.   3. History of alcohol addiction, heavy smoking  -He was alcoholic, but has stopped completely.  He is still seeing psychiatrist but no longer requires medication for cravings.  -he is a heavy smoker.  Has tried to switch to vaping but ultimately returned to smoking -Continue discussing smoking cessation     PLAN: -continue Xeloda 1500 mg q12hr, M-F only with radiation, stop with end of radiation -continue daily radiation through 03/17/21 -lab and f/u in 3 weeks, plan to restart Xeloda on next visit    No problem-specific Assessment & Plan notes found for this encounter.   SUMMARY OF ONCOLOGIC HISTORY: Oncology History Overview Note  Cancer Staging Cholangiocarcinoma [facility]) Staging form: Distal Bile Duct, AJCC 8th Edition - Pathologic stage from 10/31/2020: Stage I (pT1, pN0, cM0) - Signed by [name], MD on 11/22/2020    Cholangiocarcinoma ([place])  09/22/2020 Imaging   US Abdomen  IMPRESSION: Intrahepatic biliary ductal dilation and dilated proximal common bile duct. Findings are consistent with biliary obstruction, possibly due to a common bile duct mass, incompletely evaluated by ultrasound. Recommend multiphase CT or MRCP for further evaluation.   Mildly distended gallbladder filled with material of varying echogenicity, likely sludge and  tiny stones. In the absence of leukocytosis and right upper quadrant pain this is unlikely to represent cholecystitis.   Coarsened liver echogenicity with somewhat nodular left  hepatic lobe, compatible with chronic liver disease.   09/22/2020 Imaging   CT AP  IMPRESSION: 1. Question bibasilar pulmonary fibrosis. 2. Homogeneous hyperdensity fills the gallbladder lumen - likely represents gallbladder sludge. Intra and extrahepatic biliary ductal dilatation with no main pancreatic duct dilatation. Findings are consistent with biliary obstruction, possibly due to a common bile duct mass which is incompletely evaluated on this single phase portal venous study. Recommend MRCP for further evaluation. 3. Nonspecific perirectal fat stranding with under distension of the rectum. 4. Aortic Atherosclerosis (ICD10-I70.0).   09/23/2020 Imaging   MRI Abdomen MRCP  IMPRESSION: 1. Obstructing soft tissue mass in the proximal common bile duct at or adjacent to the junction with the cystic duct, associated with moderate to severe intrahepatic biliary ductal dilatation indicating obstruction, as discussed above. 2. Biliary sludge filling the gallbladder lumen.   09/23/2020 Procedure   ERCP, Dr. Benson Norway  Impression: - The major papilla appeared normal. - A single localized biliary stricture was found in the common bile duct. The stricture was malignant appearing. - The left and right hepatic ducts and all intrahepatic branches and common bile duct were moderately dilated, with a mass causing an obstruction. - A biliary sphincterotomy was performed. - Cells for cytology obtained in the upper third of the main bile duct. - One plastic biliary stent was placed into the common bile duct.   09/23/2020 Pathology Results   A. BILIARY, BRUSHING:   FINAL MICROSCOPIC DIAGNOSIS:  - Malignant cells consistent with adenocarcinoma    10/09/2020 Initial Diagnosis   Cholangiocarcinoma ([place])   10/10/2020 PET scan   IMPRESSION: 1. No evidence of metastatic disease. Common bile duct mass, better seen and described on MR abdomen 09/23/2020. 2. Common bile duct stent in place with persistent  biliary ductal dilatation and associated pneumobilia. 3. Tiny left renal stone. 4. Bladder wall thickening. 5. Aortic atherosclerosis (ICD10-I70.0). Coronary artery calcification. 6.  Emphysema (ICD10-J43.9).   10/10/2020 Imaging   CT AP  IMPRESSION: No suspicious liver lesions. Replaced right hepatic artery arising from the SMA.   Common bile duct tumor appears unchanged compared to prior CT. Common bile duct stent place.   Findings of fibrotic interstitial lung disease in the partially visualized lower chest, could be further evaluated with dedicated high-resolution chest CT.   10/31/2020 Cancer Staging   Staging form: Distal Bile Duct, AJCC 8th Edition - Pathologic stage from 10/31/2020: Stage I (pT1, pN0, cM0) - Signed by [name], MD on 11/22/2020 Stage prefix: Initial diagnosis Total positive nodes: 0 Residual tumor (R): R1 - Microscopic    10/31/2020 Surgery   Whipple Procedure, Dr. Michaelle Birks   10/31/2020 Pathology Results   FINAL MICROSCOPIC DIAGNOSIS:   A. COMMON HEPATIC DUCT MARGIN:  - Adenocarcinoma involving bile duct.   B. NEW COMMON HEPATIC DUCT MARGIN:  - Atypical glands consistent with adenocarcinoma.   C. RIGHT HEPATIC DUCT MARGIN:  - Connective tissue with glands showing cautery artifact.  - No diagnostic malignancy identified.   D. GALLBLADDER, CHOLECYSTECTOMY:  - Benign gallbladder.  - Benign cystic duct lymph node.   E. LYMPH NODE, STATION 8, EXCISION:  - Two lymph nodes negative for metastatic carcinoma (0/2).   F. WHIPPLE PROCEDURE:  - Ductal adenocarcinoma, 1.6 cm.  - Carcinoma involves ampulla of Vater and periductal connective tissue.  - Perineural invasion.  -  Surgical margins negative for carcinoma.  - Six lymph nodes negative for metastatic carcinoma (0/6).  - See oncology table.   G. LYMPH NODE, PORTAL, EXCISION:  - One lymph node negative for metastatic carcinoma (0/1).    10/31/2020 Cancer Staging   Staging form: Distal Bile  Duct, AJCC 8th Edition - Pathologic stage from 10/31/2020: Stage I (pT1, pN0, cM0) - Signed by [name], MD on 11/23/2020 Total positive nodes: 0 Residual tumor (R): R1 - Microscopic       INTERVAL HISTORY:  [name] [name] is here for a follow up of extrahepatic cholangiocarcinoma. He was last seen by me on 02/27/21. He presents to the clinic alone. He reports radiation is "kicking my butt;" he explains it is causing fatigue. He also reports his bowel movement are soft and loose but not diarrhea.   All other systems were reviewed with the patient and are negative.  MEDICAL HISTORY:  Past Medical History:  Diagnosis Date   Alcoholism ([place])    Anxiety    Bipolar disorder ([place])    Cancer ([place])    COPD (chronic obstructive pulmonary disease) ([place])    Depression    " post traumatic stress disorder" sees MD in [place] every 3-6 months.   Diabetes mellitus without complication ([facility])    Dysrhythmia    hx. Bundle branch block- saw Dr. Kathlee Nations   GERD (gastroesophageal reflux disease)    Hypertension    Irregular heart rate    Sleep apnea    cpap used with nose clip- Dr. Baird Lyons follows   Substance abuse [facility])    "tends to self medicate(Rx. meds or Alcohol) trying to get relief from "PSTD"    SURGICAL HISTORY: Past Surgical History:  Procedure Laterality Date   BILIARY BRUSHING  09/23/2020   Procedure: BILIARY BRUSHING;  Surgeon: Carol Ada, MD;  Location: [facility];  Service: Endoscopy;;   BILIARY STENT PLACEMENT N/A 09/23/2020   Procedure: BILIARY STENT PLACEMENT;  Surgeon: Carol Ada, MD;  Location: [facility];  Service: Endoscopy;  Laterality: N/A;   COLONOSCOPY WITH PROPOFOL N/A 01/19/2014   Procedure: COLONOSCOPY WITH PROPOFOL;  Surgeon: Juanita Craver, MD;  Location: [facility];  Service: Endoscopy;  Laterality: N/A;   ERCP N/A 09/23/2020   Procedure: ENDOSCOPIC RETROGRADE CHOLANGIOPANCREATOGRAPHY (ERCP);  Surgeon: Carol Ada, MD;  Location:  [facility];  Service: Endoscopy;  Laterality: N/A;   LAPAROSCOPY N/A 10/31/2020   Procedure: STAGING LAPAROSCOPY;  Surgeon: Dwan Bolt, MD;  Location: [facility];  Service: General;  Laterality: N/A;  GEN AND TAP BLOCK   SPHINCTEROTOMY  09/23/2020   Procedure: Joan Mayans;  Surgeon: Carol Ada, MD;  Location: [facility];  Service: Endoscopy;;   WHIPPLE PROCEDURE N/A 10/31/2020   Procedure: WHIPPLE PROCEDURE, INTRAOPERATIVE ULTRASOUND;  Surgeon: Dwan Bolt, MD;  Location: [facility];  Service: General;  Laterality: N/A;   WISDOM TOOTH EXTRACTION     66 years old    I have reviewed the social history and family history with the patient and they are unchanged from previous note.  ALLERGIES:  has No Known Allergies.  MEDICATIONS:  Current Outpatient Medications  Medication Sig Dispense Refill   acetaminophen (TYLENOL) 325 MG tablet Take 2 tablets (650 mg total) by mouth every 6 (six) hours as needed for mild pain.     albuterol (VENTOLIN HFA) 108 (90 Base) MCG/ACT inhaler Inhale 2 puffs into the lungs every 6 (six) hours as needed for wheezing or shortness of breath. 8 g 12  Ascorbic Acid (VITAMIN C) 1000 MG tablet Take 1,000 mg by mouth daily.     aspirin EC 81 MG tablet Take 81 mg by mouth daily. Swallow whole.     busPIRone (BUSPAR) 5 MG tablet Take 5 mg by mouth at bedtime.     capecitabine (XELODA) 500 MG tablet TAKE 3 TABLETS BY MOUTH EVERY 12 HOURS ON DAYS OF RADIATION, MONDAY THROUGH FRIDAY, AND OFF ON WEEKENDS 90 tablet 0   Cholecalciferol (VITAMIN D) 2000 UNITS tablet Take 2,000 Units by mouth daily.     CHROMIUM PO Take 1 tablet by mouth daily.     clonazePAM (KLONOPIN) 0.5 MG tablet TAKE 1 TABLET BY MOUTH TWICE DAILY AS NEEDED (Patient taking differently: Take 0.5 mg by mouth 2 (two) times daily as needed for anxiety.) 60 tablet 5   cloNIDine (CATAPRES) 0.1 MG tablet Take 0.1 mg by mouth 2 (two) times daily.     docusate sodium (COLACE) 100 MG capsule Take 1 capsule (100  mg total) by mouth 2 (two) times daily. 60 capsule 0   doxylamine, Sleep, (UNISOM) 25 MG tablet Take 25 mg by mouth at bedtime as needed for sleep.     ferrous sulfate 324 MG TBEC Take 324 mg by mouth daily with breakfast.     icosapent Ethyl (VASCEPA) 1 g capsule TAKE 2 CAPSULES(2 GRAMS) BY MOUTH TWICE DAILY 360 capsule 1   magic mouthwash (multi-ingredient) oral suspension Swish, gargle, and spit 5-10 mls for 1 minute. Repeat every 6 hours as needed. May swallow if throat involved. 480 mL 1   magnesium gluconate (MAGONATE) 500 MG tablet Take 500 mg by mouth daily.     metFORMIN (GLUCOPHAGE) 1000 MG tablet TAKE 1 TABLET(1000 MG) BY MOUTH TWICE DAILY WITH A MEAL 180 tablet 3   Multiple Vitamin (MULTIVITAMIN WITH MINERALS) TABS tablet Take 1 tablet by mouth daily.     PARoxetine (PAXIL) 30 MG tablet Take 60 mg by mouth at bedtime.     Potassium 99 MG TABS Take 99 mg by mouth daily.     QUEtiapine (SEROQUEL) 50 MG tablet Take 50 mg by mouth at bedtime.     tiaGABine (GABITRIL) 4 MG tablet Take 8 mg by mouth at bedtime.     vitamin B-12 (CYANOCOBALAMIN) 500 MCG tablet Take 500 mcg by mouth daily before supper.      No current facility-administered medications for this visit.    PHYSICAL EXAMINATION: ECOG PERFORMANCE STATUS: 1 - Symptomatic but completely ambulatory  Vitals:   03/13/21 0917  BP: 125/87  Pulse: 82  Resp: 17  Temp: 98.2 F (36.8 C)  SpO2: 99%   Wt Readings from Last 3 Encounters:  03/13/21 166 lb 12.8 oz (75.7 kg)  03/13/21 166 lb 4.8 oz (75.4 kg)  02/27/21 168 lb 11.2 oz (76.5 kg)     GENERAL:alert, no distress and comfortable SKIN: skin color normal, no rashes or significant lesions EYES: normal, Conjunctiva are pink and non-injected, sclera clear  NEURO: alert & oriented x 3 with fluent speech  LABORATORY DATA:  I have reviewed the data as listed CBC Latest Ref Rng & Units 03/13/2021 02/27/2021 02/17/2021  WBC 4.0 - 10.5 K/uL 5.9 5.9 4.6  Hemoglobin 13.0 - 17.0  g/dL 12.2(L) 12.3(L) 11.9(L)  Hematocrit 39.0 - 52.0 % 34.3(L) 35.1(L) 34.3(L)  Platelets 150 - 400 K/uL 178 205 181     CMP Latest Ref Rng & Units 03/13/2021 02/27/2021 02/17/2021  Glucose 70 - 99 mg/dL 99 113(H) 164(H)  BUN 8 - 23 mg/dL 9 9 14   Creatinine 0.61 - 1.24 mg/dL 0.50(L) 0.59(L) 0.57(L)  Sodium 135 - 145 mmol/L 143 140 141  Potassium 3.5 - 5.1 mmol/L 3.1(L) 3.6 3.4(L)  Chloride 98 - 111 mmol/L 100 106 107  CO2 22 - 32 mmol/L 28 29 29   Calcium 8.9 - 10.3 mg/dL 9.1 8.5(L) 8.4(L)  Total Protein 6.5 - 8.1 g/dL 5.8(L) 5.6(L) 5.4(L)  Total Bilirubin 0.3 - 1.2 mg/dL 0.2(L) 0.5 0.5  Alkaline Phos 38 - 126 U/L 80 72 76  AST 15 - 41 U/L 27 24 34  ALT 0 - 44 U/L 25 20 32      RADIOGRAPHIC STUDIES: I have personally reviewed the radiological images as listed and agreed with the findings in the report. No results found.    No orders of the defined types were placed in this encounter.  All questions were answered. The patient knows to call the clinic with any problems, questions or concerns. No barriers to learning was detected.      [name], MD 03/13/2021   I, Wilburn Mylar, am acting as scribe for [name], MD.   I have reviewed the above documentation for accuracy and completeness, and I agree with the above.

## 2021-03-13 NOTE — Assessment & Plan Note (Signed)
Both ears lavaged and patient did feel immediately better. Advised close monitoring and follow up. Notify immediately if vertigo returns or any new vision changes, weakness, numbness, facial changes, speech changes. Given that he is currently going through cancer treatment would need CT head if any ongoing symptoms or neurological changes.

## 2021-03-14 ENCOUNTER — Ambulatory Visit
Admission: RE | Admit: 2021-03-14 | Discharge: 2021-03-14 | Disposition: A | Discharge status: Home / Self Care | Payer: Federal, State, Local not specified - PPO | Source: Ambulatory Visit | Attending: Radiation Oncology | Admitting: Radiation Oncology

## 2021-03-14 DIAGNOSIS — C221 Intrahepatic bile duct carcinoma: Secondary | ICD-10-CM | POA: Diagnosis not present

## 2021-03-14 NOTE — Progress Notes (Signed)
° °                                                                                                                                                          °  Patient Name: [name] [name] MRN: [id number] DOB: 09-Aug-1955 Referring Physician: [name] (Profile Not Attached) Date of Service: 03/17/2021 [facility], [place]                                                        End Of Treatment Note  Diagnoses: C22.1-Intrahepatic bile duct carcinoma  Cancer Staging: Stage I, pT1cN0M0 Cholangiocarcinoma of the distal bile duct.  Intent: Curative  Radiation Treatment Dates: 02/06/2021 through 03/17/2021 Site Technique Total Dose (Gy) Dose per Fx (Gy) Completed Fx Beam Energies  Bile Duct: Abd IMRT 45/45 1.8 25/25 6X  Bile Duct: Abd_Bst IMRT 5.4/5.4 1.8 3/3 6X   Narrative: The patient tolerated radiation therapy relatively well. He developed fatigue and intermittent nausea and diarrhea.  Plan: The patient will receive a call in about one month from the radiation oncology department. He will continue follow up with Dr. Burr Medico as well.   ________________________________________________    Carola Rhine, [facility]

## 2021-03-15 ENCOUNTER — Ambulatory Visit
Admission: RE | Admit: 2021-03-15 | Discharge: 2021-03-15 | Disposition: A | Discharge status: Home / Self Care | Payer: Federal, State, Local not specified - PPO | Source: Ambulatory Visit | Attending: Radiation Oncology | Admitting: Radiation Oncology

## 2021-03-15 ENCOUNTER — Other Ambulatory Visit: Payer: Self-pay

## 2021-03-15 DIAGNOSIS — C221 Intrahepatic bile duct carcinoma: Secondary | ICD-10-CM | POA: Diagnosis not present

## 2021-03-16 ENCOUNTER — Other Ambulatory Visit: Payer: Self-pay | Admitting: Hematology

## 2021-03-16 ENCOUNTER — Ambulatory Visit
Admission: RE | Admit: 2021-03-16 | Discharge: 2021-03-16 | Disposition: A | Discharge status: Home / Self Care | Payer: Federal, State, Local not specified - PPO | Source: Ambulatory Visit | Attending: Radiation Oncology | Admitting: Radiation Oncology

## 2021-03-16 DIAGNOSIS — C221 Intrahepatic bile duct carcinoma: Secondary | ICD-10-CM

## 2021-03-17 ENCOUNTER — Encounter: Payer: Self-pay | Admitting: Radiation Oncology

## 2021-03-17 ENCOUNTER — Ambulatory Visit
Admission: RE | Admit: 2021-03-17 | Discharge: 2021-03-17 | Disposition: A | Discharge status: Home / Self Care | Payer: Federal, State, Local not specified - PPO | Source: Ambulatory Visit | Attending: Radiation Oncology | Admitting: Radiation Oncology

## 2021-03-17 DIAGNOSIS — C221 Intrahepatic bile duct carcinoma: Secondary | ICD-10-CM | POA: Diagnosis not present

## 2021-03-22 ENCOUNTER — Other Ambulatory Visit: Payer: Self-pay | Admitting: *Deleted

## 2021-03-22 DIAGNOSIS — E781 Pure hyperglyceridemia: Secondary | ICD-10-CM

## 2021-04-03 ENCOUNTER — Inpatient Hospital Stay: Payer: Federal, State, Local not specified - PPO | Admitting: Hematology

## 2021-04-03 ENCOUNTER — Inpatient Hospital Stay: Payer: Federal, State, Local not specified - PPO | Attending: Hematology

## 2021-04-03 ENCOUNTER — Inpatient Hospital Stay: Payer: Federal, State, Local not specified - PPO

## 2021-04-03 ENCOUNTER — Other Ambulatory Visit: Payer: Self-pay

## 2021-04-03 ENCOUNTER — Encounter: Payer: Self-pay | Admitting: Hematology

## 2021-04-03 ENCOUNTER — Telehealth: Payer: Self-pay

## 2021-04-03 DIAGNOSIS — E119 Type 2 diabetes mellitus without complications: Secondary | ICD-10-CM | POA: Diagnosis not present

## 2021-04-03 DIAGNOSIS — I10 Essential (primary) hypertension: Secondary | ICD-10-CM | POA: Diagnosis not present

## 2021-04-03 DIAGNOSIS — C240 Malignant neoplasm of extrahepatic bile duct: Secondary | ICD-10-CM | POA: Insufficient documentation

## 2021-04-03 DIAGNOSIS — C221 Intrahepatic bile duct carcinoma: Secondary | ICD-10-CM

## 2021-04-03 LAB — CMP (CANCER CENTER ONLY)
ALT: 41 U/L (ref 0–44)
AST: 37 U/L (ref 15–41)
Albumin: 3.3 g/dL — ABNORMAL LOW (ref 3.5–5.0)
Alkaline Phosphatase: 155 U/L — ABNORMAL HIGH (ref 38–126)
Anion gap: 4 — ABNORMAL LOW (ref 5–15)
BUN: 12 mg/dL (ref 8–23)
CO2: 29 mmol/L (ref 22–32)
Calcium: 8.4 mg/dL — ABNORMAL LOW (ref 8.9–10.3)
Chloride: 104 mmol/L (ref 98–111)
Creatinine: 0.6 mg/dL — ABNORMAL LOW (ref 0.61–1.24)
GFR, Estimated: >60 mL/min (ref >60)
Glucose, Bld: 82 mg/dL (ref 70–99)
Potassium: 4 mmol/L (ref 3.5–5.1)
Sodium: 137 mmol/L (ref 135–145)
Total Bilirubin: 0.4 mg/dL (ref 0.3–1.2)
Total Protein: 5.7 g/dL — ABNORMAL LOW (ref 6.5–8.1)

## 2021-04-03 LAB — CBC WITH DIFFERENTIAL (CANCER CENTER ONLY)
Abs Immature Granulocytes: 0.02 10*3/uL (ref 0.00–0.07)
Basophils Absolute: 0 10*3/uL (ref 0.0–0.1)
Basophils Relative: 1 %
Eosinophils Absolute: 0.1 10*3/uL (ref 0.0–0.5)
Eosinophils Relative: 2 %
HCT: 33.2 % — ABNORMAL LOW (ref 39.0–52.0)
Hemoglobin: 12.2 g/dL — ABNORMAL LOW (ref 13.0–17.0)
Immature Granulocytes: 0 %
Lymphocytes Relative: 10 %
Lymphs Abs: 0.5 10*3/uL — ABNORMAL LOW (ref 0.7–4.0)
MCH: 37.8 pg — ABNORMAL HIGH (ref 26.0–34.0)
MCHC: 36.7 g/dL — ABNORMAL HIGH (ref 30.0–36.0)
MCV: 102.8 fL — ABNORMAL HIGH (ref 80.0–100.0)
Monocytes Absolute: 0.4 10*3/uL (ref 0.1–1.0)
Monocytes Relative: 9 %
Neutro Abs: 3.8 10*3/uL (ref 1.7–7.7)
Neutrophils Relative %: 78 %
Platelet Count: 188 10*3/uL (ref 150–400)
RBC: 3.23 MIL/uL — ABNORMAL LOW (ref 4.22–5.81)
RDW: 14.6 % (ref 11.5–15.5)
WBC Count: 4.8 10*3/uL (ref 4.0–10.5)
nRBC: 0 % (ref 0.0–0.2)

## 2021-04-03 MED ORDER — CAPECITABINE 500 MG PO TABS: ORAL_TABLET | ORAL | 1 refills | Status: DC

## 2021-04-03 MED ORDER — CAPECITABINE 500 MG PO TABS: ORAL_TABLET | ORAL | 0 refills | Status: DC

## 2021-04-03 NOTE — Telephone Encounter (Signed)
This nurse spoke with patients wife , [name],  and made aware that patient missed his 9:20 am appointment today.  Advised that MD needs to see patient before starting his Xeloda.  Offered for patient to come in for labs at 3pm today and see the MD at 3:40.  She agreed and stated that patient will be here.  Md made aware.  No further concerns at this time.

## 2021-04-03 NOTE — Progress Notes (Signed)
[name]   Telephone:([phone]) [phone] Fax:(336) [phone]   Clinic Follow up Note   Patient Care Team: Hoyt Koch, MD as PCP - General (Internal Medicine) Deneise Lever, MD as Consulting Physician (Pulmonary Disease) Dwan Bolt, MD as Consulting Physician (General Surgery) Kyung Rudd, MD as Consulting Physician (Radiation Oncology) [name], MD as Consulting Physician (Hematology) [name], MD as Consulting Physician (Hematology) Carol Ada, MD as Consulting Physician (Gastroenterology)  Date of Service:  04/03/2021  CHIEF COMPLAINT: f/u of extrahepatic cholangiocarcinoma  CURRENT THERAPY:  Adjuvant Xeloda 2000 mg q12h on days 1 - 14, q21 days, started 12/05/20. Dose reduced to 1500 mg BID with cycle 3 due to hand/foot syndrome  ASSESSMENT & PLAN:  [name] is a 66 y.o. male with   1. Extrahepatic cholangiocarcinoma, pT1cN0M0, stage I -presented to ED on 09/22/20 with obstructive jaundice and abdominal pain; bilirubin of 15 -Abdomen MRI on 09/23/20 revealed obstructing soft tissue mass in proximal common bile duct. ERCP with bile duct brushing on 09/23/20 under Dr. Benson Norway confirmed adenocarcinoma.  He is status post CBD stent placement -PET 10/10/20 showed no evidence of nodal or metastatic disease. -baseline CEA and CA 19-9 were normal. -S/p whipple on 10/31/20 under Dr. Zenia Resides. Pathology showed: ductal adenocarcinoma, 1.6 cm, involving ampulla of Vater and periductal connective tissue; 9 negative lymph nodes (0/9). Hepatic duct margin was positive.  -He began adjuvant Xeloda 2000mg /m2 q12h for day 1-14 evey 21 days on 12/05/20. Plan to give for 6 months, per BILCAP trial data. Dose decreased to 1500mg  BID with cycle 3 due to hand-foot syndrome. -He received adjuvant radiation therapy 02/06/21-03/17/21. He continued Xeloda on radiation days -he last saw Dr. Zenia Resides on 03/01/21. -we will restart Xeloda today to complete a total of 6 months (until the  end of April)    2. Symptom Management: Diarrhea, skin toxicity, eye sensitivity -his diarrhea has improved, and he is now having soft, loose bowel movements -he is having some skin cracking from Xeloda. He endorses using lotion to prevent breakdown. -He is wearing blue light filtering glasses, which provide relief for his eye irritation.   3. History of alcohol addiction, heavy smoking  -He was alcoholic, but has stopped completely.  He is still seeing psychiatrist but no longer requires medication for cravings.  -he is a heavy smoker.  Has tried to switch to vaping but ultimately returned to smoking -Continue discussing smoking cessation     PLAN: -restart Xeloda 1500 mg q12hr, 2 weeks on/1 week off, today -lab and f/u in 2 weeks, on 3/2 or 3/3 -plan for restaging CT in 5 weeks   No problem-specific Assessment & Plan notes found for this encounter.   SUMMARY OF ONCOLOGIC HISTORY: Oncology History Overview Note  Cancer Staging Cholangiocarcinoma [facility]) Staging form: Distal Bile Duct, AJCC 8th Edition - Pathologic stage from 10/31/2020: Stage I (pT1, pN0, cM0) - Signed by [name], MD on 11/22/2020    Cholangiocarcinoma ([place])  09/22/2020 Imaging   US Abdomen  IMPRESSION: Intrahepatic biliary ductal dilation and dilated proximal common bile duct. Findings are consistent with biliary obstruction, possibly due to a common bile duct mass, incompletely evaluated by ultrasound. Recommend multiphase CT or MRCP for further evaluation.   Mildly distended gallbladder filled with material of varying echogenicity, likely sludge and tiny stones. In the absence of leukocytosis and right upper quadrant pain this is unlikely to represent cholecystitis.   Coarsened liver echogenicity with somewhat nodular left hepatic lobe, compatible with  chronic liver disease.   09/22/2020 Imaging   CT AP  IMPRESSION: 1. Question bibasilar pulmonary fibrosis. 2. Homogeneous hyperdensity fills the  gallbladder lumen - likely represents gallbladder sludge. Intra and extrahepatic biliary ductal dilatation with no main pancreatic duct dilatation. Findings are consistent with biliary obstruction, possibly due to a common bile duct mass which is incompletely evaluated on this single phase portal venous study. Recommend MRCP for further evaluation. 3. Nonspecific perirectal fat stranding with under distension of the rectum. 4. Aortic Atherosclerosis (ICD10-I70.0).   09/23/2020 Imaging   MRI Abdomen MRCP  IMPRESSION: 1. Obstructing soft tissue mass in the proximal common bile duct at or adjacent to the junction with the cystic duct, associated with moderate to severe intrahepatic biliary ductal dilatation indicating obstruction, as discussed above. 2. Biliary sludge filling the gallbladder lumen.   09/23/2020 Procedure   ERCP, Dr. Benson Norway  Impression: - The major papilla appeared normal. - A single localized biliary stricture was found in the common bile duct. The stricture was malignant appearing. - The left and right hepatic ducts and all intrahepatic branches and common bile duct were moderately dilated, with a mass causing an obstruction. - A biliary sphincterotomy was performed. - Cells for cytology obtained in the upper third of the main bile duct. - One plastic biliary stent was placed into the common bile duct.   09/23/2020 Pathology Results   A. BILIARY, BRUSHING:   FINAL MICROSCOPIC DIAGNOSIS:  - Malignant cells consistent with adenocarcinoma    10/09/2020 Initial Diagnosis   Cholangiocarcinoma ([place])   10/10/2020 PET scan   IMPRESSION: 1. No evidence of metastatic disease. Common bile duct mass, better seen and described on MR abdomen 09/23/2020. 2. Common bile duct stent in place with persistent biliary ductal dilatation and associated pneumobilia. 3. Tiny left renal stone. 4. Bladder wall thickening. 5. Aortic atherosclerosis (ICD10-I70.0). Coronary  artery calcification. 6.  Emphysema (ICD10-J43.9).   10/10/2020 Imaging   CT AP  IMPRESSION: No suspicious liver lesions. Replaced right hepatic artery arising from the SMA.   Common bile duct tumor appears unchanged compared to prior CT. Common bile duct stent place.   Findings of fibrotic interstitial lung disease in the partially visualized lower chest, could be further evaluated with dedicated high-resolution chest CT.   10/31/2020 Cancer Staging   Staging form: Distal Bile Duct, AJCC 8th Edition - Pathologic stage from 10/31/2020: Stage I (pT1, pN0, cM0) - Signed by [name], MD on 11/22/2020 Stage prefix: Initial diagnosis Total positive nodes: 0 Residual tumor (R): R1 - Microscopic    10/31/2020 Surgery   Whipple Procedure, Dr. Michaelle Birks   10/31/2020 Pathology Results   FINAL MICROSCOPIC DIAGNOSIS:   A. COMMON HEPATIC DUCT MARGIN:  - Adenocarcinoma involving bile duct.   B. NEW COMMON HEPATIC DUCT MARGIN:  - Atypical glands consistent with adenocarcinoma.   C. RIGHT HEPATIC DUCT MARGIN:  - Connective tissue with glands showing cautery artifact.  - No diagnostic malignancy identified.   D. GALLBLADDER, CHOLECYSTECTOMY:  - Benign gallbladder.  - Benign cystic duct lymph node.   E. LYMPH NODE, STATION 8, EXCISION:  - Two lymph nodes negative for metastatic carcinoma (0/2).   F. WHIPPLE PROCEDURE:  - Ductal adenocarcinoma, 1.6 cm.  - Carcinoma involves ampulla of Vater and periductal connective tissue.  - Perineural invasion.  - Surgical margins negative for carcinoma.  - Six lymph nodes negative for metastatic carcinoma (0/6).  - See oncology table.   G. LYMPH NODE, PORTAL, EXCISION:  - One lymph  node negative for metastatic carcinoma (0/1).    10/31/2020 Cancer Staging   Staging form: Distal Bile Duct, AJCC 8th Edition - Pathologic stage from 10/31/2020: Stage I (pT1, pN0, cM0) - Signed by [name], MD on 11/23/2020 Total positive nodes: 0 Residual  tumor (R): R1 - Microscopic       INTERVAL HISTORY:  [name] is here for a follow up of extrahepatic cholangiocarcinoma. He was last seen by me on 03/13/21. He presents to the clinic accompanied by his wife. He reports he has finally recovered from radiation. He notes his hands are mostly healing.   All other systems were reviewed with the patient and are negative.  MEDICAL HISTORY:  Past Medical History:  Diagnosis Date   Alcoholism ([place])    Anxiety    Bipolar disorder ([place])    Cancer ([place])    COPD (chronic obstructive pulmonary disease) ([place])    Depression    " post traumatic stress disorder" sees MD in [place] every 3-6 months.   Diabetes mellitus without complication ([facility])    Dysrhythmia    hx. Bundle branch block- saw Dr. Kathlee Nations   GERD (gastroesophageal reflux disease)    Hypertension    Irregular heart rate    Sleep apnea    cpap used with nose clip- Dr. Baird Lyons follows   Substance abuse [facility])    "tends to self medicate(Rx. meds or Alcohol) trying to get relief from "PSTD"    SURGICAL HISTORY: Past Surgical History:  Procedure Laterality Date   BILIARY BRUSHING  09/23/2020   Procedure: BILIARY BRUSHING;  Surgeon: Carol Ada, MD;  Location: [facility];  Service: Endoscopy;;   BILIARY STENT PLACEMENT N/A 09/23/2020   Procedure: BILIARY STENT PLACEMENT;  Surgeon: Carol Ada, MD;  Location: [facility];  Service: Endoscopy;  Laterality: N/A;   COLONOSCOPY WITH PROPOFOL N/A 01/19/2014   Procedure: COLONOSCOPY WITH PROPOFOL;  Surgeon: Juanita Craver, MD;  Location: [facility];  Service: Endoscopy;  Laterality: N/A;   ERCP N/A 09/23/2020   Procedure: ENDOSCOPIC RETROGRADE CHOLANGIOPANCREATOGRAPHY (ERCP);  Surgeon: Carol Ada, MD;  Location: [facility];  Service: Endoscopy;  Laterality: N/A;   LAPAROSCOPY N/A 10/31/2020   Procedure: STAGING LAPAROSCOPY;  Surgeon: Dwan Bolt, MD;  Location: [place];  Service: General;  Laterality:  N/A;  GEN AND TAP BLOCK   SPHINCTEROTOMY  09/23/2020   Procedure: [facility];  Surgeon: Carol Ada, MD;  Location: [facility];  Service: Endoscopy;;   WHIPPLE PROCEDURE N/A 10/31/2020   Procedure: WHIPPLE PROCEDURE, INTRAOPERATIVE ULTRASOUND;  Surgeon: Dwan Bolt, MD;  Location: [place];  Service: General;  Laterality: N/A;   WISDOM TOOTH EXTRACTION     66 years old    I have reviewed the social history and family history with the patient and they are unchanged from previous note.  ALLERGIES:  has No Known Allergies.  MEDICATIONS:  Current Outpatient Medications  Medication Sig Dispense Refill   acetaminophen (TYLENOL) 325 MG tablet Take 2 tablets (650 mg total) by mouth every 6 (six) hours as needed for mild pain.     albuterol (VENTOLIN HFA) 108 (90 Base) MCG/ACT inhaler Inhale 2 puffs into the lungs every 6 (six) hours as needed for wheezing or shortness of breath. 8 g 12   Ascorbic Acid (VITAMIN C) 1000 MG tablet Take 1,000 mg by mouth daily.     aspirin EC 81 MG tablet Take 81 mg by mouth daily. Swallow whole.     busPIRone (BUSPAR) 5 MG tablet  Take 5 mg by mouth at bedtime.     capecitabine (XELODA) 500 MG tablet TAKE 3 TABLETS BY MOUTH EVERY 12 HOURS for 14 days then off 7 days 84 tablet 1   Cholecalciferol (VITAMIN D) 2000 UNITS tablet Take 2,000 Units by mouth daily.     CHROMIUM PO Take 1 tablet by mouth daily.     clonazePAM (KLONOPIN) 0.5 MG tablet TAKE 1 TABLET BY MOUTH TWICE DAILY AS NEEDED (Patient taking differently: Take 0.5 mg by mouth 2 (two) times daily as needed for anxiety.) 60 tablet 5   cloNIDine (CATAPRES) 0.1 MG tablet Take 0.1 mg by mouth 2 (two) times daily.     docusate sodium (COLACE) 100 MG capsule Take 1 capsule (100 mg total) by mouth 2 (two) times daily. 60 capsule 0   doxylamine, Sleep, (UNISOM) 25 MG tablet Take 25 mg by mouth at bedtime as needed for sleep.     ferrous sulfate 324 MG TBEC Take 324 mg by mouth daily with breakfast.      icosapent Ethyl (VASCEPA) 1 g capsule TAKE 2 CAPSULES(2 GRAMS) BY MOUTH TWICE DAILY 360 capsule 1   magic mouthwash (multi-ingredient) oral suspension Swish, gargle, and spit 5-10 mls for 1 minute. Repeat every 6 hours as needed. May swallow if throat involved. 480 mL 1   magnesium gluconate (MAGONATE) 500 MG tablet Take 500 mg by mouth daily.     metFORMIN (GLUCOPHAGE) 1000 MG tablet TAKE 1 TABLET(1000 MG) BY MOUTH TWICE DAILY WITH A MEAL 180 tablet 3   Multiple Vitamin (MULTIVITAMIN WITH MINERALS) TABS tablet Take 1 tablet by mouth daily.     PARoxetine (PAXIL) 30 MG tablet Take 60 mg by mouth at bedtime.     Potassium 99 MG TABS Take 99 mg by mouth daily.     QUEtiapine (SEROQUEL) 50 MG tablet Take 50 mg by mouth at bedtime.     tiaGABine (GABITRIL) 4 MG tablet Take 8 mg by mouth at bedtime.     vitamin B-12 (CYANOCOBALAMIN) 500 MCG tablet Take 500 mcg by mouth daily before supper.      No current facility-administered medications for this visit.    PHYSICAL EXAMINATION: ECOG PERFORMANCE STATUS: 1 - Symptomatic but completely ambulatory  Vitals:   04/03/21 1541  BP: (!) 142/90  Pulse: 75  Resp: 18  Temp: 99 F (37.2 C)  SpO2: 98%   Wt Readings from Last 3 Encounters:  04/03/21 157 lb 9.6 oz (71.5 kg)  03/13/21 166 lb 12.8 oz (75.7 kg)  03/13/21 166 lb 4.8 oz (75.4 kg)     GENERAL:alert, no distress and comfortable SKIN: skin color normal, no rashes or significant lesions EYES: normal, Conjunctiva are pink and non-injected, sclera clear  NEURO: alert & oriented x 3 with fluent speech  LABORATORY DATA:  I have reviewed the data as listed CBC Latest Ref Rng & Units 04/03/2021 03/13/2021 02/27/2021  WBC 4.0 - 10.5 K/uL 4.8 5.9 5.9  Hemoglobin 13.0 - 17.0 g/dL 12.2(L) 12.2(L) 12.3(L)  Hematocrit 39.0 - 52.0 % 33.2(L) 34.3(L) 35.1(L)  Platelets 150 - 400 K/uL 188 178 205     CMP Latest Ref Rng & Units 04/03/2021 03/13/2021 02/27/2021  Glucose 70 - 99 mg/dL 82 99 113(H)  BUN 8 -  23 mg/dL 12 9 9   Creatinine 0.61 - 1.24 mg/dL 0.60(L) 0.50(L) 0.59(L)  Sodium 135 - 145 mmol/L 137 143 140  Potassium 3.5 - 5.1 mmol/L 4.0 3.1(L) 3.6  Chloride 98 - 111 mmol/L 104  100 106  CO2 22 - 32 mmol/L 29 28 29   Calcium 8.9 - 10.3 mg/dL 8.4(L) 9.1 8.5(L)  Total Protein 6.5 - 8.1 g/dL 5.7(L) 5.8(L) 5.6(L)  Total Bilirubin 0.3 - 1.2 mg/dL 0.4 0.2(L) 0.5  Alkaline Phos 38 - 126 U/L 155(H) 80 72  AST 15 - 41 U/L 37 27 24  ALT 0 - 44 U/L 41 25 20      RADIOGRAPHIC STUDIES: I have personally reviewed the radiological images as listed and agreed with the findings in the report. No results found.    Orders Placed This Encounter  Procedures   CT ABDOMEN PELVIS W CONTRAST    Standing Status:   Future    Standing Expiration Date:   04/03/2022    Order Specific Question:   If indicated for the ordered procedure, I authorize the administration of contrast media per Radiology protocol    Answer:   Yes    Order Specific Question:   Preferred imaging location?    Answer:   [facility]    Order Specific Question:   Is Oral Contrast requested for this exam?    Answer:   Yes, Per Radiology protocol   All questions were answered. The patient knows to call the clinic with any problems, questions or concerns. No barriers to learning was detected.      [name], MD 04/03/2021   I, Wilburn Mylar, am acting as scribe for [name], MD.   I have reviewed the above documentation for accuracy and completeness, and I agree with the above.

## 2021-04-04 ENCOUNTER — Telehealth: Payer: Self-pay | Admitting: Hematology

## 2021-04-04 LAB — CANCER ANTIGEN 19-9: CA 19-9: 23 U/mL (ref 0–35)

## 2021-04-04 NOTE — Telephone Encounter (Signed)
Scheduled appointment per 02/13 los. Patient aware.

## 2021-04-17 ENCOUNTER — Ambulatory Visit
Admission: RE | Admit: 2021-04-17 | Discharge: 2021-04-17 | Disposition: A | Discharge status: Home / Self Care | Payer: Federal, State, Local not specified - PPO | Source: Ambulatory Visit | Attending: Radiation Oncology | Admitting: Radiation Oncology

## 2021-04-17 DIAGNOSIS — C221 Intrahepatic bile duct carcinoma: Secondary | ICD-10-CM

## 2021-04-17 NOTE — Progress Notes (Signed)
°  Radiation Oncology         [phone]) [phone] ________________________________  Name: [name] MRN: [id number]  Date of Service: 04/17/2021  DOB: [birth year]  Post Treatment Telephone Note  Diagnosis:   Stage I, pT1cN0M0 Cholangiocarcinoma of the distal bile duct.  Intent: Curative  Radiation Treatment Dates: 02/06/2021 through 03/17/2021 Site Technique Total Dose (Gy) Dose per Fx (Gy) Completed Fx Beam Energies  Bile Duct: Abd IMRT 45/45 1.8 25/25 6X  Bile Duct: Abd_Bst IMRT 5.4/5.4 1.8 3/3 6X   Narrative: The patient tolerated radiation therapy relatively well. He developed fatigue and intermittent nausea and diarrhea. He continues to have loose BMs and is having a hard time regaining his strength and muscle tone so he joined a gym.  Impression/Plan: 1. 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 I was unable to reach the patient but I was able to speak with his wife. We discussed OTC Imodium. She also plans to discuss with Dr. Zenia Resides since the patient tried creon for this issue but felt like the symptoms were persistent, perhaps diarrhea was even worse. I   discussed that we would be happy to continue to follow him as needed, but he will also continue to follow up with Dr. Burr Medico in medical oncology.      Carola Rhine, PAC

## 2021-04-19 ENCOUNTER — Other Ambulatory Visit (HOSPITAL_COMMUNITY): Payer: Self-pay

## 2021-04-19 NOTE — Progress Notes (Signed)
? ? ? ?HPI ?male smoker followed for OSA, COPD, Interstitial Fibrosis, tobacco use, complicated by obesity, PTSD/anxiety/depression, history syncope with RBBB/left hemiblock/ Dr Einar Gip  ?NPSG 11/12/1998- RDI/ AHI 75.8/hr. BiPAP 25/20 RDI 0/hr, tested in [place] ?PFT 06/14/11-within normal. FEV1 4.01/112%, FEV1/FVC 0.71, slight response to bronchodilator in small airways, ? DLCO 87% ?Office Spirometry 06/24/17-WNL-FVC 4.74/95%, FEV1 3.21/85%, ratio 0.68, FEF 25-75% 2.09/67% ?PFT 11/05/19- minimal obstruction, no response to BD, moderate Diffusion deficit ?-------------------------------------------------------------------. ? ?10/20/20- 66 year old male Smoker (50 pk yrs) followed for OSA, COPD, Interstitial Fibrosis, tobacco use, complicated by obesity, PTSD/anxiety/depression, history syncope with RBBB/left hemiblock/ Dr Einar Gip, DM2,  Cholangiocarcinoma   ?CPAP auto 5-15/[facility] ?Download-compliance 27%, AHI 2.7/ hr- mostly short nights, but uses 93% of nights,        averaging 3-4 hrs/ night. ?Body weight today-182 lbs ?Covid vax-3 Phizer ? -----No complaint's ?He has lost 60 lbs over past year intentionally with diet, exercise and no alcohol (AA)  in months.  ?Falls asleep on couch before putting on CPAP. ?Not needing inhalers- breathing "ok". ?Pending Whipple for cholangiocarcinoma. ? ?04/20/21- 66 year old male Smoker (43 pk yrs) followed for OSA, COPD, Interstitial Fibrosis, tobacco use, complicated by obesity, PTSD/anxiety/depression, history syncope with RBBB/left hemiblock/ Dr Einar Gip, DM2,  Cholangiocarcinoma / Whipple 2022/ XRT/chemo,  ?-clonazepam 0.5,   ?CPAP auto 5-15/[facility] ?Download-compliance stopped CPAP as of Dec- no longer needed ?Body weight today-157 lbs ?Covid vax-1 J&J, 2 Moderna ?Flu vax-had ?Tried Vaping but went back to cigs- Oncology has discussed with him. ?Follow up. Patient is no longer using cpap. Patient says everything else is going okay.  ?Major weight loss after Whipple surgery and completion  of XRT for his cholangiocarcinoma.  No longer feels need for CPAP.  Wife tells him he still snores but unlabored and without apnea.  He feels well rested. ?Minimal morning cough otherwise chest feels clear.  No blood or fever.  He has been given a life expectancy projection of around 2 years so he does not feel any particular incentive to stop smoking.  We discussed this. ?He has joined a gym, trying to regain some strength and I encouraged that. ? ?ROS-see HPI + = positive ?Constitutional:   +weight loss, night sweats, fevers, chills, fatigue, lassitude. ?HEENT:   No-  headaches, difficulty swallowing, tooth/dental problems, sore throat,  ?     No-  sneezing, itching, ear ache, +nasal congestion, post nasal drip,  ?CV:  No-   chest pain, orthopnea, PND, swelling in lower extremities, anasarca, dizziness, palpitations ?Resp:    shortness of breath with exertion or at rest.   ?              productive cough,   non-productive cough,  No- coughing up of blood.   ?           No-   change in color of mucus.  No- wheezing.   ?Skin: No-   rash or lesions. ?GI:  No-   heartburn, indigestion, abdominal pain, nausea, vomiting,  ?GU:  ?MS:  No-   joint pain or swelling.  + back pain. ?Neuro-    No acute issue ?Psych:  No- change in mood or affect. +depression or anxiety.  No memory loss. ? ?OBJ- Physical Exam ?General- Alert, Oriented, Affect-appropriate, Distress- none acute, +thinner ?Skin- rash-none, lesions- none, excoriation- none,  ?Lymphadenopathy- none ?Head- atraumatic ?           Eyes- Gross vision intact, PERRLA, conjunctivae and secretions clear ?  Ears- Hearing, canals-normal ?           Nose- +turbinate edema, no-Septal dev, mucus, polyps, erosion, perforation  ?           Throat- Mallampati III , mucosa clear , drainage- none, tonsils- atrophic, + missing tooth ?Neck- flexible , trachea midline, no stridor , thyroid nl, carotid no bruit ?Chest - symmetrical excursion , unlabored ?          Heart/CV- RRR  ,+ trace S murmur , no gallop  , no rub, nl s1 s2 ?                          - JVD- none , edema- none, stasis changes- none, varices- none ?          Lung- + clear, unlabored, wheeze- none, cough- none , dullness-none, rub- none,  ?          Chest wall-  ?Abd- ?Br/ Gen/ Rectal- Not done, not indicated ?Extrem- cyanosis- none, clubbing, none, atrophy- none, strength- nl ?Neuro- grossly intact to observation ? ? ? ? ? ? ? ? ? ?

## 2021-04-20 ENCOUNTER — Encounter: Payer: Self-pay | Admitting: Internal Medicine

## 2021-04-20 ENCOUNTER — Ambulatory Visit: Payer: Federal, State, Local not specified - PPO | Admitting: Internal Medicine

## 2021-04-20 ENCOUNTER — Other Ambulatory Visit: Payer: Self-pay

## 2021-04-20 DIAGNOSIS — G4733 Obstructive sleep apnea (adult) (pediatric): Secondary | ICD-10-CM

## 2021-04-20 DIAGNOSIS — J449 Chronic obstructive pulmonary disease, unspecified: Secondary | ICD-10-CM | POA: Diagnosis not present

## 2021-04-20 DIAGNOSIS — C221 Intrahepatic bile duct carcinoma: Secondary | ICD-10-CM

## 2021-04-20 NOTE — Assessment & Plan Note (Signed)
He has only a rescue inhaler which he has not used at all.  We discussed indications but it is appropriate for him to continue without inhalers at this time. ?

## 2021-04-20 NOTE — Assessment & Plan Note (Signed)
He has lost significant weight and has only mild residual snoring without witnessed apnea.  He no longer feels need for CPAP and I think it is appropriate to watch over time and see how he does. ?Plan-CPAP DC'd ?

## 2021-04-20 NOTE — Patient Instructions (Signed)
I hope things going well and I do recommend you try to stop smoking. We can reassess your breathing at night if you and your wife get concerned. ? ?Please call if we can help ? ?

## 2021-04-20 NOTE — Assessment & Plan Note (Signed)
He indicates he has been given a projected life expectancy of may be 2 years. ?

## 2021-04-21 ENCOUNTER — Inpatient Hospital Stay: Payer: Federal, State, Local not specified - PPO | Attending: Hematology

## 2021-04-21 ENCOUNTER — Inpatient Hospital Stay: Payer: Federal, State, Local not specified - PPO | Admitting: Hematology

## 2021-04-21 ENCOUNTER — Encounter: Payer: Self-pay | Admitting: Hematology

## 2021-04-21 VITALS — BP 133/79 | HR 65 | Temp 98.3°F | Resp 16 | Ht 67.0 in | Wt 160.7 lb

## 2021-04-21 DIAGNOSIS — F1021 Alcohol dependence, in remission: Secondary | ICD-10-CM | POA: Diagnosis not present

## 2021-04-21 DIAGNOSIS — I10 Essential (primary) hypertension: Secondary | ICD-10-CM | POA: Insufficient documentation

## 2021-04-21 DIAGNOSIS — E119 Type 2 diabetes mellitus without complications: Secondary | ICD-10-CM | POA: Insufficient documentation

## 2021-04-21 DIAGNOSIS — R197 Diarrhea, unspecified: Secondary | ICD-10-CM | POA: Diagnosis not present

## 2021-04-21 DIAGNOSIS — F17210 Nicotine dependence, cigarettes, uncomplicated: Secondary | ICD-10-CM | POA: Diagnosis not present

## 2021-04-21 DIAGNOSIS — C240 Malignant neoplasm of extrahepatic bile duct: Secondary | ICD-10-CM | POA: Diagnosis not present

## 2021-04-21 DIAGNOSIS — C221 Intrahepatic bile duct carcinoma: Secondary | ICD-10-CM

## 2021-04-21 LAB — CMP (CANCER CENTER ONLY)
ALT: 31 U/L (ref 0–44)
AST: 28 U/L (ref 15–41)
Albumin: 3.5 g/dL (ref 3.5–5.0)
Alkaline Phosphatase: 103 U/L (ref 38–126)
Anion gap: 3 — ABNORMAL LOW (ref 5–15)
BUN: 15 mg/dL (ref 8–23)
CO2: 29 mmol/L (ref 22–32)
Calcium: 8.6 mg/dL — ABNORMAL LOW (ref 8.9–10.3)
Chloride: 106 mmol/L (ref 98–111)
Creatinine: 0.7 mg/dL (ref 0.61–1.24)
GFR, Estimated: >60 mL/min (ref >60)
Glucose, Bld: 124 mg/dL — ABNORMAL HIGH (ref 70–99)
Potassium: 4.6 mmol/L (ref 3.5–5.1)
Sodium: 138 mmol/L (ref 135–145)
Total Bilirubin: 0.3 mg/dL (ref 0.3–1.2)
Total Protein: 5.9 g/dL — ABNORMAL LOW (ref 6.5–8.1)

## 2021-04-21 LAB — CBC WITH DIFFERENTIAL (CANCER CENTER ONLY)
Abs Immature Granulocytes: 0.02 10*3/uL (ref 0.00–0.07)
Basophils Absolute: 0 10*3/uL (ref 0.0–0.1)
Basophils Relative: 1 %
Eosinophils Absolute: 0.1 10*3/uL (ref 0.0–0.5)
Eosinophils Relative: 2 %
HCT: 30.9 % — ABNORMAL LOW (ref 39.0–52.0)
Hemoglobin: 11.4 g/dL — ABNORMAL LOW (ref 13.0–17.0)
Immature Granulocytes: 0 %
Lymphocytes Relative: 9 %
Lymphs Abs: 0.5 10*3/uL — ABNORMAL LOW (ref 0.7–4.0)
MCH: 38.3 pg — ABNORMAL HIGH (ref 26.0–34.0)
MCHC: 36.9 g/dL — ABNORMAL HIGH (ref 30.0–36.0)
MCV: 103.7 fL — ABNORMAL HIGH (ref 80.0–100.0)
Monocytes Absolute: 0.4 10*3/uL (ref 0.1–1.0)
Monocytes Relative: 7 %
Neutro Abs: 4.8 10*3/uL (ref 1.7–7.7)
Neutrophils Relative %: 81 %
Platelet Count: 186 10*3/uL (ref 150–400)
RBC: 2.98 MIL/uL — ABNORMAL LOW (ref 4.22–5.81)
RDW: 14 % (ref 11.5–15.5)
WBC Count: 5.8 10*3/uL (ref 4.0–10.5)
nRBC: 0 % (ref 0.0–0.2)

## 2021-04-21 NOTE — Progress Notes (Signed)
[place]   [phone] [phone]   Clinic Follow up Note   Patient Care Team: Hoyt Koch, MD as PCP - General (Internal Medicine) Deneise Lever, MD as Consulting Physician (Pulmonary Disease) Dwan Bolt, MD as Consulting Physician (General Surgery) Kyung Rudd, MD as Consulting Physician (Radiation Oncology) [name], MD as Consulting Physician (Hematology) [name], MD as Consulting Physician (Hematology) Carol Ada, MD as Consulting Physician (Gastroenterology)  Date of Service:  04/21/2021  CHIEF COMPLAINT: f/u of extrahepatic cholangiocarcinoma  CURRENT THERAPY:  Adjuvant Xeloda 2000 mg q12h on days 1 - 14, q21 days, started 12/05/20. Dose reduced to 1500 mg BID with cycle 3 due to hand/foot syndrome  ASSESSMENT & PLAN:  [name] [name] is a 66 y.o. male with   1. Extrahepatic cholangiocarcinoma, pT1cN0M0, stage I -presented to ED on 09/22/20 with obstructive jaundice and abdominal pain; bilirubin of 15 -Abdomen MRI on 09/23/20 revealed obstructing soft tissue mass in proximal common bile duct. ERCP with bile duct brushing on 09/23/20 under Dr. Benson Norway confirmed adenocarcinoma.  He is status post CBD stent placement -PET 10/10/20 showed no evidence of nodal or metastatic disease. -baseline CEA and CA 19-9 were normal. -S/p whipple on 10/31/20 under Dr. Zenia Resides. Pathology showed: ductal adenocarcinoma, 1.6 cm, involving ampulla of Vater and periductal connective tissue; 9 negative lymph nodes (0/9). Hepatic duct margin was positive.  -He began adjuvant Xeloda 2000mg /m2 q12h for day 1-14 evey 21 days on 12/05/20. Plan to give for 6 months, per BILCAP trial data. Dose decreased to 1500mg  BID with cycle 3 due to hand-foot syndrome. -He received adjuvant radiation therapy 02/06/21-03/17/21. He continued Xeloda on radiation days -he last saw Dr. Zenia Resides on 03/01/21. -he continues on Xeloda, plan to finish in April (2 more cycles). Labs  reviewed, overall stable. We plan for restaging CT in 3 weeks.   2. Symptom Management: Diarrhea, skin toxicity, eye sensitivity -his diarrhea has improved, and he is now having soft, loose bowel movements -he is having some skin cracking from Xeloda. He endorses using lotion to prevent breakdown. -He is wearing blue light filtering glasses, which provide relief for his eye irritation.   3. History of alcohol addiction, heavy smoking  -He was alcoholic, but has stopped completely.  He is still seeing psychiatrist but no longer requires medication for cravings.  -he is a heavy smoker.  Has tried to switch to vaping but ultimately returned to smoking -Continue discussing smoking cessation     PLAN: -restart Xeloda 1500 mg q12hr, 2 weeks on/1 week off, today -f/u in 3 weeks with lab and CT several days before, plan to give one more cycle after next visit    No problem-specific Assessment & Plan notes found for this encounter.   SUMMARY OF ONCOLOGIC HISTORY: Oncology History Overview Note  Cancer Staging Cholangiocarcinoma [facility]) Staging form: Distal Bile Duct, AJCC 8th Edition - Pathologic stage from 10/31/2020: Stage I (pT1, pN0, cM0) - Signed by [name], MD on 11/22/2020    Cholangiocarcinoma ([place])  09/22/2020 Imaging   US Abdomen  IMPRESSION: Intrahepatic biliary ductal dilation and dilated proximal common bile duct. Findings are consistent with biliary obstruction, possibly due to a common bile duct mass, incompletely evaluated by ultrasound. Recommend multiphase CT or MRCP for further evaluation.   Mildly distended gallbladder filled with material of varying echogenicity, likely sludge and tiny stones. In the absence of leukocytosis and right upper quadrant pain this is unlikely to represent cholecystitis.   Coarsened  liver echogenicity with somewhat nodular left hepatic lobe, compatible with chronic liver disease.   09/22/2020 Imaging   CT AP  IMPRESSION: 1. Question  bibasilar pulmonary fibrosis. 2. Homogeneous hyperdensity fills the gallbladder lumen - likely represents gallbladder sludge. Intra and extrahepatic biliary ductal dilatation with no main pancreatic duct dilatation. Findings are consistent with biliary obstruction, possibly due to a common bile duct mass which is incompletely evaluated on this single phase portal venous study. Recommend MRCP for further evaluation. 3. Nonspecific perirectal fat stranding with under distension of the rectum. 4. Aortic Atherosclerosis (ICD10-I70.0).   09/23/2020 Imaging   MRI Abdomen MRCP  IMPRESSION: 1. Obstructing soft tissue mass in the proximal common bile duct at or adjacent to the junction with the cystic duct, associated with moderate to severe intrahepatic biliary ductal dilatation indicating obstruction, as discussed above. 2. Biliary sludge filling the gallbladder lumen.   09/23/2020 Procedure   ERCP, Dr. Benson Norway  Impression: - The major papilla appeared normal. - A single localized biliary stricture was found in the common bile duct. The stricture was malignant appearing. - The left and right hepatic ducts and all intrahepatic branches and common bile duct were moderately dilated, with a mass causing an obstruction. - A biliary sphincterotomy was performed. - Cells for cytology obtained in the upper third of the main bile duct. - One plastic biliary stent was placed into the common bile duct.   09/23/2020 Pathology Results   A. BILIARY, BRUSHING:   FINAL MICROSCOPIC DIAGNOSIS:  - Malignant cells consistent with adenocarcinoma    10/09/2020 Initial Diagnosis   Cholangiocarcinoma ([place])   10/10/2020 PET scan   IMPRESSION: 1. No evidence of metastatic disease. Common bile duct mass, better seen and described on MR abdomen 09/23/2020. 2. Common bile duct stent in place with persistent biliary ductal dilatation and associated pneumobilia. 3. Tiny left renal stone. 4. Bladder wall  thickening. 5. Aortic atherosclerosis (ICD10-I70.0). Coronary artery calcification. 6.  Emphysema (ICD10-J43.9).   10/10/2020 Imaging   CT AP  IMPRESSION: No suspicious liver lesions. Replaced right hepatic artery arising from the SMA.   Common bile duct tumor appears unchanged compared to prior CT. Common bile duct stent place.   Findings of fibrotic interstitial lung disease in the partially visualized lower chest, could be further evaluated with dedicated high-resolution chest CT.   10/31/2020 Cancer Staging   Staging form: Distal Bile Duct, AJCC 8th Edition - Pathologic stage from 10/31/2020: Stage I (pT1, pN0, cM0) - Signed by [name], MD on 11/22/2020 Stage prefix: Initial diagnosis Total positive nodes: 0 Residual tumor (R): R1 - Microscopic    10/31/2020 Surgery   Whipple Procedure, Dr. Michaelle Birks   10/31/2020 Pathology Results   FINAL MICROSCOPIC DIAGNOSIS:   A. COMMON HEPATIC DUCT MARGIN:  - Adenocarcinoma involving bile duct.   B. NEW COMMON HEPATIC DUCT MARGIN:  - Atypical glands consistent with adenocarcinoma.   C. RIGHT HEPATIC DUCT MARGIN:  - Connective tissue with glands showing cautery artifact.  - No diagnostic malignancy identified.   D. GALLBLADDER, CHOLECYSTECTOMY:  - Benign gallbladder.  - Benign cystic duct lymph node.   E. LYMPH NODE, STATION 8, EXCISION:  - Two lymph nodes negative for metastatic carcinoma (0/2).   F. WHIPPLE PROCEDURE:  - Ductal adenocarcinoma, 1.6 cm.  - Carcinoma involves ampulla of Vater and periductal connective tissue.  - Perineural invasion.  - Surgical margins negative for carcinoma.  - Six lymph nodes negative for metastatic carcinoma (0/6).  - See oncology table.  G. LYMPH NODE, PORTAL, EXCISION:  - One lymph node negative for metastatic carcinoma (0/1).    10/31/2020 Cancer Staging   Staging form: Distal Bile Duct, AJCC 8th Edition - Pathologic stage from 10/31/2020: Stage I (pT1, pN0, cM0) - Signed by  [name], MD on 11/23/2020 Total positive nodes: 0 Residual tumor (R): R1 - Microscopic       INTERVAL HISTORY:  [name] [name] is here for a follow up of extrahepatic cholangiocarcinoma. He was last seen by me on 04/03/21. He presents to the clinic accompanied by his wife. He reports his hands continue to split/crack; he works with his hands a lot. His wife notes his heels have also started to crack.  He also reports loose stool, not quite diarrhea.   All other systems were reviewed with the patient and are negative.  MEDICAL HISTORY:  Past Medical History:  Diagnosis Date   Alcoholism ([place])    Anxiety    Bipolar disorder ([place])    Cancer ([place])    COPD (chronic obstructive pulmonary disease) ([place])    Depression    " post traumatic stress disorder" sees MD in [place] every 3-6 months.   Diabetes mellitus without complication ([facility])    Dysrhythmia    hx. Bundle branch block- saw Dr. Kathlee Nations   GERD (gastroesophageal reflux disease)    Hypertension    Irregular heart rate    Sleep apnea    cpap used with nose clip- Dr. Baird Lyons follows   Substance abuse [facility])    "tends to self medicate(Rx. meds or Alcohol) trying to get relief from "PSTD"    SURGICAL HISTORY: Past Surgical History:  Procedure Laterality Date   BILIARY BRUSHING  09/23/2020   Procedure: BILIARY BRUSHING;  Surgeon: Carol Ada, MD;  Location: [facility];  Service: Endoscopy;;   BILIARY STENT PLACEMENT N/A 09/23/2020   Procedure: BILIARY STENT PLACEMENT;  Surgeon: Carol Ada, MD;  Location: [facility];  Service: Endoscopy;  Laterality: N/A;   COLONOSCOPY WITH PROPOFOL N/A 01/19/2014   Procedure: COLONOSCOPY WITH PROPOFOL;  Surgeon: Juanita Craver, MD;  Location: [facility];  Service: Endoscopy;  Laterality: N/A;   ERCP N/A 09/23/2020   Procedure: ENDOSCOPIC RETROGRADE CHOLANGIOPANCREATOGRAPHY (ERCP);  Surgeon: Carol Ada, MD;  Location: [facility];  Service: Endoscopy;   Laterality: N/A;   LAPAROSCOPY N/A 10/31/2020   Procedure: STAGING LAPAROSCOPY;  Surgeon: Dwan Bolt, MD;  Location: [place];  Service: General;  Laterality: N/A;  GEN AND TAP BLOCK   SPHINCTEROTOMY  09/23/2020   Procedure: Joan Mayans;  Surgeon: Carol Ada, MD;  Location: [facility];  Service: Endoscopy;;   WHIPPLE PROCEDURE N/A 10/31/2020   Procedure: WHIPPLE PROCEDURE, INTRAOPERATIVE ULTRASOUND;  Surgeon: Dwan Bolt, MD;  Location: [place];  Service: General;  Laterality: N/A;   WISDOM TOOTH EXTRACTION     66 years old    I have reviewed the social history and family history with the patient and they are unchanged from previous note.  ALLERGIES:  has No Known Allergies.  MEDICATIONS:  Current Outpatient Medications  Medication Sig Dispense Refill   acetaminophen (TYLENOL) 325 MG tablet Take 2 tablets (650 mg total) by mouth every 6 (six) hours as needed for mild pain.     albuterol (VENTOLIN HFA) 108 (90 Base) MCG/ACT inhaler Inhale 2 puffs into the lungs every 6 (six) hours as needed for wheezing or shortness of breath. 8 g 12   Ascorbic Acid (VITAMIN C) 1000 MG tablet Take 1,000 mg by mouth  daily.     aspirin EC 81 MG tablet Take 81 mg by mouth daily. Swallow whole.     busPIRone (BUSPAR) 5 MG tablet Take 5 mg by mouth at bedtime.     capecitabine (XELODA) 500 MG tablet TAKE 3 TABLETS BY MOUTH EVERY 12 HOURS for 14 days then off 7 days 84 tablet 1   Cholecalciferol (VITAMIN D) 2000 UNITS tablet Take 2,000 Units by mouth daily.     CHROMIUM PO Take 1 tablet by mouth daily.     clonazePAM (KLONOPIN) 0.5 MG tablet TAKE 1 TABLET BY MOUTH TWICE DAILY AS NEEDED (Patient taking differently: Take 0.5 mg by mouth 2 (two) times daily as needed for anxiety.) 60 tablet 5   cloNIDine (CATAPRES) 0.1 MG tablet Take 0.1 mg by mouth 2 (two) times daily.     docusate sodium (COLACE) 100 MG capsule Take 1 capsule (100 mg total) by mouth 2 (two) times daily. 60 capsule 0   doxylamine,  Sleep, (UNISOM) 25 MG tablet Take 25 mg by mouth at bedtime as needed for sleep.     ferrous sulfate 324 MG TBEC Take 324 mg by mouth daily with breakfast.     icosapent Ethyl (VASCEPA) 1 g capsule TAKE 2 CAPSULES(2 GRAMS) BY MOUTH TWICE DAILY 360 capsule 1   magic mouthwash (multi-ingredient) oral suspension Swish, gargle, and spit 5-10 mls for 1 minute. Repeat every 6 hours as needed. May swallow if throat involved. 480 mL 1   magnesium gluconate (MAGONATE) 500 MG tablet Take 500 mg by mouth daily.     metFORMIN (GLUCOPHAGE) 1000 MG tablet TAKE 1 TABLET(1000 MG) BY MOUTH TWICE DAILY WITH A MEAL 180 tablet 3   Multiple Vitamin (MULTIVITAMIN WITH MINERALS) TABS tablet Take 1 tablet by mouth daily.     PARoxetine (PAXIL) 30 MG tablet Take 60 mg by mouth at bedtime.     Potassium 99 MG TABS Take 99 mg by mouth daily.     QUEtiapine (SEROQUEL) 50 MG tablet Take 50 mg by mouth at bedtime.     tiaGABine (GABITRIL) 4 MG tablet Take 8 mg by mouth at bedtime.     vitamin B-12 (CYANOCOBALAMIN) 500 MCG tablet Take 500 mcg by mouth daily before supper.      No current facility-administered medications for this visit.    PHYSICAL EXAMINATION: ECOG PERFORMANCE STATUS: 1 - Symptomatic but completely ambulatory  Vitals:   04/21/21 1054  BP: 133/79  Pulse: 65  Resp: 16  Temp: 98.3 F (36.8 C)  SpO2: 100%   Wt Readings from Last 3 Encounters:  04/21/21 160 lb 11.2 oz (72.9 kg)  04/20/21 157 lb 6.4 oz (71.4 kg)  04/03/21 157 lb 9.6 oz (71.5 kg)     GENERAL:alert, no distress and comfortable SKIN: skin color normal, no rashes or significant lesions EYES: normal, Conjunctiva are pink and non-injected, sclera clear  NEURO: alert & oriented x 3 with fluent speech  LABORATORY DATA:  I have reviewed the data as listed CBC Latest Ref Rng & Units 04/21/2021 04/03/2021 03/13/2021  WBC 4.0 - 10.5 K/uL 5.8 4.8 5.9  Hemoglobin 13.0 - 17.0 g/dL 11.4(L) 12.2(L) 12.2(L)  Hematocrit 39.0 - 52.0 % 30.9(L)  33.2(L) 34.3(L)  Platelets 150 - 400 K/uL 186 188 178     CMP Latest Ref Rng & Units 04/21/2021 04/03/2021 03/13/2021  Glucose 70 - 99 mg/dL 124(H) 82 99  BUN 8 - 23 mg/dL 15 12 9   Creatinine 0.61 - 1.24 mg/dL 0.70 0.60(L)  0.50(L)  Sodium 135 - 145 mmol/L 138 137 143  Potassium 3.5 - 5.1 mmol/L 4.6 4.0 3.1(L)  Chloride 98 - 111 mmol/L 106 104 100  CO2 22 - 32 mmol/L 29 29 28   Calcium 8.9 - 10.3 mg/dL 8.6(L) 8.4(L) 9.1  Total Protein 6.5 - 8.1 g/dL 5.9(L) 5.7(L) 5.8(L)  Total Bilirubin 0.3 - 1.2 mg/dL 0.3 0.4 0.2(L)  Alkaline Phos 38 - 126 U/L 103 155(H) 80  AST 15 - 41 U/L 28 37 27  ALT 0 - 44 U/L 31 41 25      RADIOGRAPHIC STUDIES: I have personally reviewed the radiological images as listed and agreed with the findings in the report. No results found.    No orders of the defined types were placed in this encounter.  All questions were answered. The patient knows to call the clinic with any problems, questions or concerns. No barriers to learning was detected.      [name], MD 04/21/2021   I, Wilburn Mylar, am acting as scribe for [name], MD.   I have reviewed the above documentation for accuracy and completeness, and I agree with the above.

## 2021-04-24 ENCOUNTER — Telehealth: Payer: Self-pay | Admitting: Hematology

## 2021-04-24 NOTE — Telephone Encounter (Signed)
Unable to leave message with follow-up appointment per 3/3 los. Mailed calendar. ?

## 2021-05-08 ENCOUNTER — Other Ambulatory Visit: Payer: Self-pay

## 2021-05-08 ENCOUNTER — Ambulatory Visit (HOSPITAL_COMMUNITY)
Admission: RE | Admit: 2021-05-08 | Discharge: 2021-05-08 | Disposition: A | Discharge status: Home / Self Care | Payer: Federal, State, Local not specified - PPO | Source: Ambulatory Visit | Attending: Hematology | Admitting: Hematology

## 2021-05-08 DIAGNOSIS — C221 Intrahepatic bile duct carcinoma: Secondary | ICD-10-CM | POA: Insufficient documentation

## 2021-05-08 MED ORDER — SODIUM CHLORIDE (PF) 0.9 % IJ SOLN
INTRAMUSCULAR | Status: AC
  Filled 2021-05-08: qty 50

## 2021-05-08 MED ORDER — IOHEXOL 300 MG/ML  SOLN
100.0000 mL | Freq: Once | INTRAMUSCULAR | Status: AC | PRN
  Administered 2021-05-08: 100 mL via INTRAVENOUS

## 2021-05-12 ENCOUNTER — Encounter: Payer: Self-pay | Admitting: Hematology

## 2021-05-12 ENCOUNTER — Inpatient Hospital Stay: Payer: Federal, State, Local not specified - PPO

## 2021-05-12 ENCOUNTER — Inpatient Hospital Stay: Payer: Federal, State, Local not specified - PPO | Admitting: Hematology

## 2021-05-12 ENCOUNTER — Other Ambulatory Visit: Payer: Self-pay

## 2021-05-12 VITALS — BP 152/92 | HR 75 | Temp 98.7°F | Resp 18 | Ht 67.0 in | Wt 158.8 lb

## 2021-05-12 DIAGNOSIS — C221 Intrahepatic bile duct carcinoma: Secondary | ICD-10-CM | POA: Diagnosis not present

## 2021-05-12 DIAGNOSIS — C240 Malignant neoplasm of extrahepatic bile duct: Secondary | ICD-10-CM | POA: Diagnosis not present

## 2021-05-12 LAB — CMP (CANCER CENTER ONLY)
ALT: 26 U/L (ref 0–44)
AST: 29 U/L (ref 15–41)
Albumin: 3.4 g/dL — ABNORMAL LOW (ref 3.5–5.0)
Alkaline Phosphatase: 95 U/L (ref 38–126)
Anion gap: 3 — ABNORMAL LOW (ref 5–15)
BUN: 12 mg/dL (ref 8–23)
CO2: 28 mmol/L (ref 22–32)
Calcium: 8.4 mg/dL — ABNORMAL LOW (ref 8.9–10.3)
Chloride: 107 mmol/L (ref 98–111)
Creatinine: 0.62 mg/dL (ref 0.61–1.24)
GFR, Estimated: >60 mL/min (ref >60)
Glucose, Bld: 104 mg/dL — ABNORMAL HIGH (ref 70–99)
Potassium: 4.1 mmol/L (ref 3.5–5.1)
Sodium: 138 mmol/L (ref 135–145)
Total Bilirubin: 0.5 mg/dL (ref 0.3–1.2)
Total Protein: 5.7 g/dL — ABNORMAL LOW (ref 6.5–8.1)

## 2021-05-12 LAB — CBC WITH DIFFERENTIAL (CANCER CENTER ONLY)
Abs Immature Granulocytes: 0.01 10*3/uL (ref 0.00–0.07)
Basophils Absolute: 0.1 10*3/uL (ref 0.0–0.1)
Basophils Relative: 1 %
Eosinophils Absolute: 0.1 10*3/uL (ref 0.0–0.5)
Eosinophils Relative: 1 %
HCT: 31 % — ABNORMAL LOW (ref 39.0–52.0)
Hemoglobin: 10.9 g/dL — ABNORMAL LOW (ref 13.0–17.0)
Immature Granulocytes: 0 %
Lymphocytes Relative: 14 %
Lymphs Abs: 0.7 10*3/uL (ref 0.7–4.0)
MCH: 36.9 pg — ABNORMAL HIGH (ref 26.0–34.0)
MCHC: 35.2 g/dL (ref 30.0–36.0)
MCV: 105.1 fL — ABNORMAL HIGH (ref 80.0–100.0)
Monocytes Absolute: 0.4 10*3/uL (ref 0.1–1.0)
Monocytes Relative: 9 %
Neutro Abs: 3.6 10*3/uL (ref 1.7–7.7)
Neutrophils Relative %: 75 %
Platelet Count: 179 10*3/uL (ref 150–400)
RBC: 2.95 MIL/uL — ABNORMAL LOW (ref 4.22–5.81)
RDW: 14.4 % (ref 11.5–15.5)
WBC Count: 4.9 10*3/uL (ref 4.0–10.5)
nRBC: 0 % (ref 0.0–0.2)

## 2021-05-12 NOTE — Progress Notes (Signed)
?[place]   ?Telephone:(336) [phone] [phone]   ?Clinic Follow up Note  ? ?Patient Care Team: ?Hoyt Koch, MD as PCP - General (Internal Medicine) ?Deneise Lever, MD as Consulting Physician (Pulmonary Disease) ?Dwan Bolt, MD as Consulting Physician (General Surgery) ?Kyung Rudd, MD as Consulting Physician (Radiation Oncology) ?[name], MD as Consulting Physician (Hematology) ?[name], MD as Consulting Physician (Hematology) ?Carol Ada, MD as Consulting Physician (Gastroenterology) ? ?Date of Service:  05/12/2021 ? ?CHIEF COMPLAINT: f/u of extrahepatic cholangiocarcinoma ? ?CURRENT THERAPY:  ?Surveillance ? ?ASSESSMENT & PLAN:  ?[name] [name] is a 66 y.o. male with  ? ?1. Extrahepatic cholangiocarcinoma, pT1cN0M0, stage I ?-presented to ED on 09/22/20 with obstructive jaundice and abdominal pain; bilirubin of 15. Abdomen MRI revealed obstructing soft tissue mass in proximal common bile duct.  ?-emergent ERCP with bile duct brushing under Dr. Benson Norway confirmed adenocarcinoma. He is s/p CBD stent placement ?-PET 10/10/20 showed no evidence of nodal or metastatic disease. ?-baseline CEA and CA 19-9 were normal. ?-S/p whipple on 10/31/20 under Dr. Zenia Resides. Pathology showed: ductal adenocarcinoma, 1.6 cm, involving ampulla of Vater and periductal connective tissue; 9 negative lymph nodes (0/9). Hepatic duct margin was positive.  ?-He began adjuvant Xeloda '2000mg'$ /m2 q12h for day 1-14 evey 21 days on 12/05/20. Plan to give for 6 months, per BILCAP trial data. Dose decreased to '1500mg'$  BID with cycle 3 due to hand-foot syndrome. ?-He received adjuvant radiation therapy 02/06/21-03/17/21. He continued Xeloda on radiation days ?-restaging CT AP on 05/08/21 showed NED. I reviewed with them today. ?-he continues on Xeloda. He notes he got a bit mixed up by his appointments and xeloda schedule. I advised him to take a week off from today, then restart and take what he has left (for 5  days). Labs reviewed, hgb down to 10.9, likely related to taking 4 extra days of Xeloda this week. No refills on Xeloda  ?-we discussed his risk of cancer recurrence and cancer surveillance, symptoms to watch at home.  Plan to see him back in 3 months and repeat surveillance CT scan in 6 months. ?  ?2. Symptom Management: Diarrhea, skin toxicity, eye sensitivity ?-his diarrhea has improved, and he is now having soft, loose bowel movements with imodium ?-he is having some skin cracking from Xeloda. He endorses using lotion to prevent breakdown. ?-He is wearing blue light filtering glasses, which provide relief for his eye irritation. ?  ?3. History of alcohol addiction, heavy smoking  ?-He was alcoholic, but has stopped completely.  He is now 17 months sober as of 04/2021. ?-he is a heavy smoker.  Has tried to switch to vaping but ultimately returned to smoking ?-Continue discussing smoking cessation ?  ?  ?PLAN: ?-take a week off Xeloda, then restart and finish remaining medicine (about 5 days) ?-lab and f/u in 3 months ? ? ?No problem-specific Assessment & Plan notes found for this encounter. ? ? ?SUMMARY OF ONCOLOGIC HISTORY: ?Oncology History Overview Note  ?Cancer Staging ?Cholangiocarcinoma ([place]) ?Staging form: Distal Bile Duct, AJCC 8th Edition ?- Pathologic stage from 10/31/2020: Stage I (pT1, pN0, cM0) - Signed by [name], MD on 11/22/2020 ? ?  ?Cholangiocarcinoma ([place])  ?09/22/2020 Imaging  ? US Abdomen ? ?IMPRESSION: ?Intrahepatic biliary ductal dilation and dilated proximal common ?bile duct. Findings are consistent with biliary obstruction, ?possibly due to a common bile duct mass, incompletely evaluated by ultrasound. Recommend multiphase CT or MRCP for further evaluation. ?  ?Mildly distended gallbladder  filled with material of varying ?echogenicity, likely sludge and tiny stones. In the absence of ?leukocytosis and right upper quadrant pain this is unlikely to ?represent cholecystitis. ?  ?Coarsened  liver echogenicity with somewhat nodular left hepatic ?lobe, compatible with chronic liver disease. ?  ?09/22/2020 Imaging  ? CT AP ? ?IMPRESSION: ?1. Question bibasilar pulmonary fibrosis. ?2. Homogeneous hyperdensity fills the gallbladder lumen - likely ?represents gallbladder sludge. Intra and extrahepatic biliary ductal ?dilatation with no main pancreatic duct dilatation. Findings are ?consistent with biliary obstruction, possibly due to a common bile ?duct mass which is incompletely evaluated on this single phase ?portal venous study. Recommend MRCP for further evaluation. ?3. Nonspecific perirectal fat stranding with under distension of the ?rectum. ?4. Aortic Atherosclerosis (ICD10-I70.0). ?  ?09/23/2020 Imaging  ? MRI Abdomen MRCP ? ?IMPRESSION: ?1. Obstructing soft tissue mass in the proximal common bile duct at ?or adjacent to the junction with the cystic duct, associated with ?moderate to severe intrahepatic biliary ductal dilatation indicating ?obstruction, as discussed above. ?2. Biliary sludge filling the gallbladder lumen. ?  ?09/23/2020 Procedure  ? ERCP, Dr. Benson Norway ? ?Impression: ?- The major papilla appeared normal. ?- A single localized biliary stricture was found in the common bile duct. The stricture was malignant appearing. ?- The left and right hepatic ducts and all intrahepatic branches and common bile duct were moderately dilated, with a mass causing an obstruction. ?- A biliary sphincterotomy was performed. ?- Cells for cytology obtained in the upper third of the main bile duct. ?- One plastic biliary stent was placed into the common bile duct. ?  ?09/23/2020 Pathology Results  ? A. BILIARY, BRUSHING:  ? ?FINAL MICROSCOPIC DIAGNOSIS:  ?- Malignant cells consistent with adenocarcinoma  ?  ?10/09/2020 Initial Diagnosis  ? Cholangiocarcinoma [facility]) ?  ?10/10/2020 PET scan  ? IMPRESSION: ?1. No evidence of metastatic disease. Common bile duct mass, better seen and described on MR abdomen 09/23/2020. ?2.  Common bile duct stent in place with persistent biliary ductal ?dilatation and associated pneumobilia. ?3. Tiny left renal stone. ?4. Bladder wall thickening. ?5. Aortic atherosclerosis (ICD10-I70.0). Coronary artery ?calcification. ?6.  Emphysema (ICD10-J43.9). ?  ?10/10/2020 Imaging  ? CT AP ? ?IMPRESSION: ?No suspicious liver lesions. Replaced right hepatic artery arising ?from the SMA. ?  ?Common bile duct tumor appears unchanged compared to prior CT. Common bile duct stent place. ?  ?Findings of fibrotic interstitial lung disease in the partially ?visualized lower chest, could be further evaluated with dedicated ?high-resolution chest CT. ?  ?10/31/2020 Cancer Staging  ? Staging form: Distal Bile Duct, AJCC 8th Edition ?- Pathologic stage from 10/31/2020: Stage I (pT1, pN0, cM0) - Signed by [name], MD on 11/22/2020 ?Stage prefix: Initial diagnosis ?Total positive nodes: 0 ?Residual tumor (R): R1 - Microscopic ? ?  ?10/31/2020 Surgery  ? Whipple Procedure, Dr. Michaelle Birks ?  ?10/31/2020 Pathology Results  ? FINAL MICROSCOPIC DIAGNOSIS:  ? ?A. COMMON HEPATIC DUCT MARGIN:  ?- Adenocarcinoma involving bile duct.  ? ?B. NEW COMMON HEPATIC DUCT MARGIN:  ?- Atypical glands consistent with adenocarcinoma.  ? ?C. RIGHT HEPATIC DUCT MARGIN:  ?- Connective tissue with glands showing cautery artifact.  ?- No diagnostic malignancy identified.  ? ?D. GALLBLADDER, CHOLECYSTECTOMY:  ?- Benign gallbladder.  ?- Benign cystic duct lymph node.  ? ?E. LYMPH NODE, STATION 8, EXCISION:  ?- Two lymph nodes negative for metastatic carcinoma (0/2).  ? ?F. WHIPPLE PROCEDURE:  ?- Ductal adenocarcinoma, 1.6 cm.  ?- Carcinoma involves ampulla of Vater and  periductal connective tissue.  ?- Perineural invasion.  ?- Surgical margins negative for carcinoma.  ?- Six lymph nodes negative for metastatic carcinoma (0/6).  ?- See oncology table.  ? ?G. LYMPH NODE, PORTAL, EXCISION:  ?- One lymph node negative for metastatic carcinoma (0/1).  ?   ?10/31/2020 Cancer Staging  ? Staging form: Distal Bile Duct, AJCC 8th Edition ?- Pathologic stage from 10/31/2020: Stage I (pT1, pN0, cM0) - Signed by [name], MD on 11/23/2020 ?Total positive nodes: 0 ?Residual

## 2021-05-13 ENCOUNTER — Encounter: Payer: Self-pay | Admitting: Hematology

## 2021-05-13 LAB — CANCER ANTIGEN 19-9: CA 19-9: 15 U/mL (ref 0–35)

## 2021-05-15 ENCOUNTER — Other Ambulatory Visit: Payer: Federal, State, Local not specified - PPO

## 2021-05-15 ENCOUNTER — Ambulatory Visit: Payer: Federal, State, Local not specified - PPO | Admitting: Hematology

## 2021-06-21 ENCOUNTER — Ambulatory Visit: Payer: Federal, State, Local not specified - PPO | Admitting: Internal Medicine

## 2021-06-21 ENCOUNTER — Encounter: Payer: Self-pay | Admitting: Internal Medicine

## 2021-06-21 VITALS — BP 122/80 | HR 59 | Resp 18 | Ht 67.0 in | Wt 159.6 lb

## 2021-06-21 DIAGNOSIS — F1021 Alcohol dependence, in remission: Secondary | ICD-10-CM

## 2021-06-21 DIAGNOSIS — E43 Unspecified severe protein-calorie malnutrition: Secondary | ICD-10-CM | POA: Diagnosis not present

## 2021-06-21 DIAGNOSIS — E118 Type 2 diabetes mellitus with unspecified complications: Secondary | ICD-10-CM

## 2021-06-21 DIAGNOSIS — E441 Mild protein-calorie malnutrition: Secondary | ICD-10-CM

## 2021-06-21 LAB — HEMOGLOBIN A1C: Hgb A1c MFr Bld: 4.9 % (ref 4.6–6.5)

## 2021-06-21 LAB — COMPREHENSIVE METABOLIC PANEL
ALT: 74 U/L — ABNORMAL HIGH (ref 0–53)
AST: 100 U/L — ABNORMAL HIGH (ref 0–37)
Albumin: 3.4 g/dL — ABNORMAL LOW (ref 3.5–5.2)
Alkaline Phosphatase: 115 U/L (ref 39–117)
BUN: 17 mg/dL (ref 6–23)
CO2: 25 mEq/L (ref 19–32)
Calcium: 8 mg/dL — ABNORMAL LOW (ref 8.4–10.5)
Chloride: 108 mEq/L (ref 96–112)
Creatinine, Ser: 0.52 mg/dL (ref 0.40–1.50)
GFR: 107.14 mL/min (ref >60.00)
Glucose, Bld: 103 mg/dL — ABNORMAL HIGH (ref 70–99)
Potassium: 4.2 mEq/L (ref 3.5–5.1)
Sodium: 138 mEq/L (ref 135–145)
Total Bilirubin: 0.3 mg/dL (ref 0.2–1.2)
Total Protein: 5.7 g/dL — ABNORMAL LOW (ref 6.0–8.3)

## 2021-06-21 LAB — CBC
HCT: 35.6 % — ABNORMAL LOW (ref 39.0–52.0)
Hemoglobin: 12.3 g/dL — ABNORMAL LOW (ref 13.0–17.0)
MCHC: 34.6 g/dL (ref 30.0–36.0)
MCV: 106.1 fl — ABNORMAL HIGH (ref 78.0–100.0)
Platelets: 168 10*3/uL (ref 150.0–400.0)
RBC: 3.36 Mil/uL — ABNORMAL LOW (ref 4.22–5.81)
RDW: 14 % (ref 11.5–15.5)
WBC: 4.8 10*3/uL (ref 4.0–10.5)

## 2021-06-21 LAB — VITAMIN B12: Vitamin B-12: 560 pg/mL (ref 211–911)

## 2021-06-21 LAB — VITAMIN D 25 HYDROXY (VIT D DEFICIENCY, FRACTURES): VITD: 16.85 ng/mL — ABNORMAL LOW (ref 30.00–100.00)

## 2021-06-21 NOTE — Progress Notes (Signed)
? ?  Subjective:  ? ?Patient ID: [name] [name], male    DOB: Jun 24, 1955, 66 y.o.   MRN: [id number] ? ?HPI ?The patient is a 66 YO man coming in for concerns.  ? ?Review of Systems  ?Constitutional:  Positive for activity change, appetite change and fatigue.  ?HENT: Negative.    ?Eyes: Negative.   ?Respiratory:  Negative for cough, chest tightness and shortness of breath.   ?Cardiovascular:  Negative for chest pain, palpitations and leg swelling.  ?Gastrointestinal:  Positive for diarrhea. Negative for abdominal distention, abdominal pain, constipation, nausea and vomiting.  ?Musculoskeletal: Negative.   ?Skin:  Positive for wound.  ?Neurological: Negative.   ?Hematological:  Bruises/bleeds easily.  ?Psychiatric/Behavioral: Negative.    ? ?Objective:  ?Physical Exam ?Constitutional:   ?   Appearance: He is well-developed. He is ill-appearing.  ?HENT:  ?   Head: Normocephalic and atraumatic.  ?Cardiovascular:  ?   Rate and Rhythm: Normal rate and regular rhythm.  ?Pulmonary:  ?   Effort: Pulmonary effort is normal. No respiratory distress.  ?   Breath sounds: Normal breath sounds. No wheezing or rales.  ?Abdominal:  ?   General: Bowel sounds are normal. There is no distension.  ?   Palpations: Abdomen is soft.  ?   Tenderness: There is no abdominal tenderness. There is no rebound.  ?Musculoskeletal:  ?   Cervical back: Normal range of motion.  ?   Right lower leg: Edema present.  ?   Left lower leg: Edema present.  ?   Comments: 1+ pitting edema to knees bilaterally  ?Skin: ?   General: Skin is warm and dry.  ?   Comments: Multiple skin tears on exam none infected or actively bleeding  ?Neurological:  ?   Mental Status: He is alert and oriented to person, place, and time.  ?   Coordination: Coordination normal.  ? ? ?Vitals:  ? 06/21/21 0913  ?BP: 122/80  ?Pulse: (!) 59  ?Resp: 18  ?SpO2: 98%  ?Weight: 159 lb 9.6 oz (72.4 kg)  ?Height: '5\' 7"'$  (1.702 m)  ? ? ?This visit occurred during the SARS-CoV-2 public health  emergency.  Safety protocols were in place, including screening questions prior to the visit, additional usage of staff PPE, and extensive cleaning of exam room while observing appropriate contact time as indicated for disinfecting solutions.  ? ?Assessment & Plan:  ? ?

## 2021-06-21 NOTE — Patient Instructions (Signed)
We will check the labs and will plan to stop the metformin altogether. ? ? ?

## 2021-06-22 ENCOUNTER — Telehealth: Payer: Self-pay | Admitting: Internal Medicine

## 2021-06-22 NOTE — Telephone Encounter (Signed)
Pts spouse requesting a cb for 06-21-2021 lab results ? ? ?

## 2021-06-23 ENCOUNTER — Encounter: Payer: Self-pay | Admitting: Internal Medicine

## 2021-06-23 ENCOUNTER — Telehealth: Payer: Self-pay | Admitting: Internal Medicine

## 2021-06-23 ENCOUNTER — Telehealth: Payer: Self-pay

## 2021-06-23 DIAGNOSIS — E46 Unspecified protein-calorie malnutrition: Secondary | ICD-10-CM | POA: Insufficient documentation

## 2021-06-23 DIAGNOSIS — R609 Edema, unspecified: Secondary | ICD-10-CM | POA: Insufficient documentation

## 2021-06-23 NOTE — Assessment & Plan Note (Addendum)
1+ edema bilaterally and recently has gone through chemo and radiation. Last albumin was mildly low so suspect related to undernutrition. Checking CMP to assess albumin today. ?

## 2021-06-23 NOTE — Assessment & Plan Note (Signed)
Mildly low albumin and some edema on exam that I suspect is related to nutritional deficiency. Checking CMP and B12 and vitamin D and treat as appropriate. He is doing protein drinks to help with adequate nutrition and is recovering since chemo is stopped. We are also working to stop his diarrhea so he can absorb more nutrients from his food.  ?

## 2021-06-23 NOTE — Assessment & Plan Note (Signed)
Given rapid weight loss through cancer and chemo prior HgA1c at goal. He is struggling with diarrhea and we are checking HgA1c and planning to stop metformin regardless. If HgA1c >7 will add another agent. If <7 will not add another agent and will see back in 3 months for recheck Hga1c.  ?

## 2021-06-23 NOTE — Telephone Encounter (Signed)
Results have not been resulted yet.  ?

## 2021-06-23 NOTE — Assessment & Plan Note (Addendum)
Still in remission and he understands that with his recent chemo it is vital to stay away from alcohol and he agrees and intends lifelong cessation. Checking CMP and CBC. ?

## 2021-06-23 NOTE — Telephone Encounter (Signed)
See other phone note

## 2021-06-23 NOTE — Telephone Encounter (Signed)
Pt spouse called in requesting a call back regarding lab work for pt.  ? ?States she wants to discuss results as she does not understand.  ?

## 2021-06-23 NOTE — Telephone Encounter (Signed)
Pt's wife called stating that the pt saw his PCP this week on 06/21/2021 at which time labs were drawn.  Wife stated that the labs have resulted and the pt's AST & ALT are both extremely elevated which raises some concern.  Pt's wife also stated that the pt's last chemo was completed in March 2023 and that the pt has not done anything different than what he was doing while receiving chemo.  Wife denied pt taking vitamin supplements or consuming alcohol.  Informed spouse that this RN will notify Dr. Burr Medico and ask her to review the pt's recent labs.   ?

## 2021-06-26 ENCOUNTER — Other Ambulatory Visit: Payer: Self-pay

## 2021-07-12 ENCOUNTER — Other Ambulatory Visit: Payer: Self-pay

## 2021-07-12 ENCOUNTER — Inpatient Hospital Stay: Payer: Federal, State, Local not specified - PPO | Attending: Hematology

## 2021-07-12 DIAGNOSIS — R1900 Intra-abdominal and pelvic swelling, mass and lump, unspecified site: Secondary | ICD-10-CM | POA: Diagnosis not present

## 2021-07-12 DIAGNOSIS — F17210 Nicotine dependence, cigarettes, uncomplicated: Secondary | ICD-10-CM | POA: Diagnosis not present

## 2021-07-12 DIAGNOSIS — E119 Type 2 diabetes mellitus without complications: Secondary | ICD-10-CM | POA: Insufficient documentation

## 2021-07-12 DIAGNOSIS — I10 Essential (primary) hypertension: Secondary | ICD-10-CM | POA: Diagnosis not present

## 2021-07-12 DIAGNOSIS — C240 Malignant neoplasm of extrahepatic bile duct: Secondary | ICD-10-CM | POA: Diagnosis present

## 2021-07-12 DIAGNOSIS — R109 Unspecified abdominal pain: Secondary | ICD-10-CM | POA: Diagnosis not present

## 2021-07-12 DIAGNOSIS — C221 Intrahepatic bile duct carcinoma: Secondary | ICD-10-CM

## 2021-07-12 LAB — CMP (CANCER CENTER ONLY)
ALT: 144 U/L — ABNORMAL HIGH (ref 0–44)
AST: 140 U/L — ABNORMAL HIGH (ref 15–41)
Albumin: 3.6 g/dL (ref 3.5–5.0)
Alkaline Phosphatase: 252 U/L — ABNORMAL HIGH (ref 38–126)
Anion gap: 4 — ABNORMAL LOW (ref 5–15)
BUN: 16 mg/dL (ref 8–23)
CO2: 28 mmol/L (ref 22–32)
Calcium: 8.4 mg/dL — ABNORMAL LOW (ref 8.9–10.3)
Chloride: 108 mmol/L (ref 98–111)
Creatinine: 0.53 mg/dL — ABNORMAL LOW (ref 0.61–1.24)
GFR, Estimated: >60 mL/min (ref >60)
Glucose, Bld: 104 mg/dL — ABNORMAL HIGH (ref 70–99)
Potassium: 4 mmol/L (ref 3.5–5.1)
Sodium: 140 mmol/L (ref 135–145)
Total Bilirubin: 0.5 mg/dL (ref 0.3–1.2)
Total Protein: 6.2 g/dL — ABNORMAL LOW (ref 6.5–8.1)

## 2021-07-12 LAB — CBC WITH DIFFERENTIAL (CANCER CENTER ONLY)
Abs Immature Granulocytes: 0.01 10*3/uL (ref 0.00–0.07)
Basophils Absolute: 0 10*3/uL (ref 0.0–0.1)
Basophils Relative: 1 %
Eosinophils Absolute: 0.1 10*3/uL (ref 0.0–0.5)
Eosinophils Relative: 2 %
HCT: 36.1 % — ABNORMAL LOW (ref 39.0–52.0)
Hemoglobin: 12.9 g/dL — ABNORMAL LOW (ref 13.0–17.0)
Immature Granulocytes: 0 %
Lymphocytes Relative: 11 %
Lymphs Abs: 0.6 10*3/uL — ABNORMAL LOW (ref 0.7–4.0)
MCH: 35.7 pg — ABNORMAL HIGH (ref 26.0–34.0)
MCHC: 35.7 g/dL (ref 30.0–36.0)
MCV: 100 fL (ref 80.0–100.0)
Monocytes Absolute: 0.4 10*3/uL (ref 0.1–1.0)
Monocytes Relative: 8 %
Neutro Abs: 4.4 10*3/uL (ref 1.7–7.7)
Neutrophils Relative %: 78 %
Platelet Count: 192 10*3/uL (ref 150–400)
RBC: 3.61 MIL/uL — ABNORMAL LOW (ref 4.22–5.81)
RDW: 11.9 % (ref 11.5–15.5)
WBC Count: 5.7 10*3/uL (ref 4.0–10.5)
nRBC: 0 % (ref 0.0–0.2)

## 2021-07-13 ENCOUNTER — Telehealth: Payer: Self-pay

## 2021-07-13 ENCOUNTER — Other Ambulatory Visit: Payer: Self-pay | Admitting: Hematology

## 2021-07-13 DIAGNOSIS — C249 Malignant neoplasm of biliary tract, unspecified: Secondary | ICD-10-CM

## 2021-07-13 LAB — CANCER ANTIGEN 19-9: CA 19-9: 21 U/mL (ref 0–35)

## 2021-07-13 NOTE — Telephone Encounter (Signed)
Pt's wife called wanting to go over the lab results from pt's last lab draw.  Sent staff message to Dr. Burr Medico and Cira Rue, NP to give pt's wife a call to go over the results.

## 2021-07-14 ENCOUNTER — Telehealth: Payer: Self-pay

## 2021-07-14 ENCOUNTER — Other Ambulatory Visit: Payer: Self-pay

## 2021-07-14 ENCOUNTER — Other Ambulatory Visit: Payer: Self-pay | Admitting: Nurse Practitioner

## 2021-07-14 DIAGNOSIS — C221 Intrahepatic bile duct carcinoma: Secondary | ICD-10-CM

## 2021-07-14 DIAGNOSIS — C249 Malignant neoplasm of biliary tract, unspecified: Secondary | ICD-10-CM

## 2021-07-14 NOTE — Telephone Encounter (Signed)
Late Entry:  LVM on pt's telephone to see if pt could come in today to [facility] for further assessment.  Instructed pt to give Dr. Ernestina Penna office a call.  Pt had not responded as of 12 noon.  Tried recalling pt and was successful in reaching pt's spouse.  Pt's spouse stated they will be in to [facility] on 07/18/2021 for the Abdominal US and f/u with [facility] afterwards.  Notified [facility] to schedule the appt.

## 2021-07-18 ENCOUNTER — Encounter: Payer: Federal, State, Local not specified - PPO | Admitting: Physician Assistant

## 2021-07-18 ENCOUNTER — Ambulatory Visit (HOSPITAL_COMMUNITY)
Admission: RE | Admit: 2021-07-18 | Discharge: 2021-07-18 | Disposition: A | Discharge status: Home / Self Care | Payer: Federal, State, Local not specified - PPO | Source: Ambulatory Visit | Attending: Hematology | Admitting: Hematology

## 2021-07-18 ENCOUNTER — Other Ambulatory Visit: Payer: Self-pay | Admitting: Physician Assistant

## 2021-07-18 DIAGNOSIS — C221 Intrahepatic bile duct carcinoma: Secondary | ICD-10-CM | POA: Diagnosis present

## 2021-07-18 DIAGNOSIS — R109 Unspecified abdominal pain: Secondary | ICD-10-CM

## 2021-07-18 DIAGNOSIS — C249 Malignant neoplasm of biliary tract, unspecified: Secondary | ICD-10-CM | POA: Diagnosis present

## 2021-07-19 ENCOUNTER — Other Ambulatory Visit: Payer: Self-pay

## 2021-07-19 ENCOUNTER — Inpatient Hospital Stay (HOSPITAL_BASED_OUTPATIENT_CLINIC_OR_DEPARTMENT_OTHER): Payer: Federal, State, Local not specified - PPO | Admitting: Physician Assistant

## 2021-07-19 ENCOUNTER — Ambulatory Visit (HOSPITAL_COMMUNITY)
Admission: RE | Admit: 2021-07-19 | Discharge: 2021-07-19 | Disposition: A | Discharge status: Home / Self Care | Payer: Federal, State, Local not specified - PPO | Source: Ambulatory Visit | Attending: Physician Assistant | Admitting: Physician Assistant

## 2021-07-19 VITALS — BP 149/81 | HR 50 | Temp 98.0°F | Resp 16 | Wt 159.7 lb

## 2021-07-19 DIAGNOSIS — R1900 Intra-abdominal and pelvic swelling, mass and lump, unspecified site: Secondary | ICD-10-CM | POA: Insufficient documentation

## 2021-07-19 DIAGNOSIS — R109 Unspecified abdominal pain: Secondary | ICD-10-CM | POA: Insufficient documentation

## 2021-07-19 DIAGNOSIS — C240 Malignant neoplasm of extrahepatic bile duct: Secondary | ICD-10-CM | POA: Diagnosis not present

## 2021-07-19 LAB — CBC WITH DIFFERENTIAL (CANCER CENTER ONLY)
Abs Immature Granulocytes: 0.01 10*3/uL (ref 0.00–0.07)
Basophils Absolute: 0 10*3/uL (ref 0.0–0.1)
Basophils Relative: 1 %
Eosinophils Absolute: 0.1 10*3/uL (ref 0.0–0.5)
Eosinophils Relative: 2 %
HCT: 35.5 % — ABNORMAL LOW (ref 39.0–52.0)
Hemoglobin: 12.3 g/dL — ABNORMAL LOW (ref 13.0–17.0)
Immature Granulocytes: 0 %
Lymphocytes Relative: 10 %
Lymphs Abs: 0.5 10*3/uL — ABNORMAL LOW (ref 0.7–4.0)
MCH: 35.1 pg — ABNORMAL HIGH (ref 26.0–34.0)
MCHC: 34.6 g/dL (ref 30.0–36.0)
MCV: 101.4 fL — ABNORMAL HIGH (ref 80.0–100.0)
Monocytes Absolute: 0.4 10*3/uL (ref 0.1–1.0)
Monocytes Relative: 10 %
Neutro Abs: 3.4 10*3/uL (ref 1.7–7.7)
Neutrophils Relative %: 77 %
Platelet Count: 157 10*3/uL (ref 150–400)
RBC: 3.5 MIL/uL — ABNORMAL LOW (ref 4.22–5.81)
RDW: 12.2 % (ref 11.5–15.5)
WBC Count: 4.4 10*3/uL (ref 4.0–10.5)
nRBC: 0 % (ref 0.0–0.2)

## 2021-07-19 LAB — CMP (CANCER CENTER ONLY)
ALT: 130 U/L — ABNORMAL HIGH (ref 0–44)
AST: 121 U/L — ABNORMAL HIGH (ref 15–41)
Albumin: 3.4 g/dL — ABNORMAL LOW (ref 3.5–5.0)
Alkaline Phosphatase: 220 U/L — ABNORMAL HIGH (ref 38–126)
Anion gap: 3 — ABNORMAL LOW (ref 5–15)
BUN: 10 mg/dL (ref 8–23)
CO2: 28 mmol/L (ref 22–32)
Calcium: 8.4 mg/dL — ABNORMAL LOW (ref 8.9–10.3)
Chloride: 110 mmol/L (ref 98–111)
Creatinine: 0.53 mg/dL — ABNORMAL LOW (ref 0.61–1.24)
GFR, Estimated: >60 mL/min (ref >60)
Glucose, Bld: 116 mg/dL — ABNORMAL HIGH (ref 70–99)
Potassium: 3.7 mmol/L (ref 3.5–5.1)
Sodium: 141 mmol/L (ref 135–145)
Total Bilirubin: 0.4 mg/dL (ref 0.3–1.2)
Total Protein: 5.8 g/dL — ABNORMAL LOW (ref 6.5–8.1)

## 2021-07-19 LAB — LIPASE, BLOOD: Lipase: 22 U/L (ref 11–51)

## 2021-07-19 MED ORDER — IOHEXOL 300 MG/ML  SOLN
100.0000 mL | Freq: Once | INTRAMUSCULAR | Status: AC | PRN
  Administered 2021-07-19: 100 mL via INTRAVENOUS

## 2021-07-19 MED ORDER — IOHEXOL 9 MG/ML PO SOLN: 500.0000 mL | ORAL | Status: DC

## 2021-07-19 MED ORDER — SODIUM CHLORIDE (PF) 0.9 % IJ SOLN
INTRAMUSCULAR | Status: AC
  Filled 2021-07-19: qty 50

## 2021-07-19 MED ORDER — IOHEXOL 9 MG/ML PO SOLN
ORAL | Status: AC
  Filled 2021-07-19: qty 1000

## 2021-07-19 NOTE — Progress Notes (Unsigned)
Symptom Management Consult note [place]    Patient Care Team: Hoyt Koch, MD as PCP - General (Internal Medicine) Deneise Lever, MD as Consulting Physician (Pulmonary Disease) Dwan Bolt, MD as Consulting Physician (General Surgery) Kyung Rudd, MD as Consulting Physician (Radiation Oncology) [name], MD as Consulting Physician (Hematology) [name], MD as Consulting Physician (Hematology) Carol Ada, MD as Consulting Physician (Gastroenterology)    Name of the patient: [name] [name]  [id number]  26-Sep-1955   Date of visit: 07/19/2021    Chief complaint/ Reason for visit- abdominal bloating, diarrhea  Oncology History Overview Note  Cancer 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 Findings are consistent with biliary obstruction, possibly due to a common bile duct mass, incompletely evaluated by ultrasound. Recommend multiphase CT or MRCP for further evaluation.   Mildly distended gallbladder filled with material of varying echogenicity, likely sludge and tiny stones. In the absence of leukocytosis and right upper quadrant pain this is unlikely to represent cholecystitis.   Coarsened liver echogenicity with somewhat nodular left hepatic lobe, compatible with chronic liver disease.   09/22/2020 Imaging   CT AP  IMPRESSION: 1. Question bibasilar pulmonary fibrosis. 2. Homogeneous hyperdensity fills the gallbladder lumen - likely represents gallbladder sludge. Intra and extrahepatic biliary ductal dilatation with no main pancreatic duct dilatation. Findings are consistent with biliary obstruction, possibly due to a common bile duct mass  which is incompletely evaluated on this single phase portal venous study. Recommend MRCP for further evaluation. 3. Nonspecific perirectal fat stranding with under distension of the rectum. 4. Aortic Atherosclerosis (ICD10-I70.0).   09/23/2020 Imaging   MRI Abdomen MRCP  IMPRESSION: 1. Obstructing soft tissue mass in the proximal common bile duct at or adjacent to the junction with the cystic duct, associated with moderate to severe intrahepatic biliary ductal dilatation indicating obstruction, as discussed above. 2. Biliary sludge filling the gallbladder lumen.   09/23/2020 Procedure   ERCP, Dr. Benson Norway  Impression: - The major papilla appeared normal. - A single localized biliary stricture was found in the common bile duct. The stricture was malignant appearing. - The left and right hepatic ducts and all intrahepatic branches and common bile duct were moderately dilated, with a mass causing an obstruction. - A biliary sphincterotomy was performed. - Cells for cytology obtained in the upper third of the main bile duct. - One plastic biliary stent was placed into the common bile duct.   09/23/2020 Pathology Results   A. BILIARY, BRUSHING:   FINAL MICROSCOPIC DIAGNOSIS:  - Malignant cells consistent with adenocarcinoma    10/09/2020 Initial Diagnosis   Cholangiocarcinoma ([place])   10/10/2020 PET scan   IMPRESSION: 1. No evidence of metastatic disease. Common bile duct mass, better seen and described on MR abdomen 09/23/2020. 2. Common bile duct stent in place with persistent biliary ductal dilatation and associated pneumobilia. 3. Tiny left renal stone. 4. Bladder wall thickening. 5. Aortic atherosclerosis (ICD10-I70.0). Coronary artery calcification. 6.  Emphysema (ICD10-J43.9).   10/10/2020 Imaging   CT AP  IMPRESSION: No suspicious liver lesions. Replaced right hepatic artery arising from the SMA.   Common bile duct tumor appears unchanged compared to prior CT. Common bile  duct stent place.  Findings of fibrotic interstitial lung disease in the partially visualized lower chest, could be further evaluated with dedicated high-resolution chest CT.   10/31/2020 Cancer Staging   Staging form: Distal Bile Duct, AJCC 8th Edition - Pathologic stage from 10/31/2020: Stage I (pT1, pN0, cM0) - Signed by [name], MD on 11/22/2020 Stage prefix: Initial diagnosis Total positive nodes: 0 Residual tumor (R): R1 - Microscopic    10/31/2020 Surgery   Whipple Procedure, Dr. Michaelle Birks   10/31/2020 Pathology Results   FINAL MICROSCOPIC DIAGNOSIS:   A. COMMON HEPATIC DUCT MARGIN:  - Adenocarcinoma involving bile duct.   B. NEW COMMON HEPATIC DUCT MARGIN:  - Atypical glands consistent with adenocarcinoma.   C. RIGHT HEPATIC DUCT MARGIN:  - Connective tissue with glands showing cautery artifact.  - No diagnostic malignancy identified.   D. GALLBLADDER, CHOLECYSTECTOMY:  - Benign gallbladder.  - Benign cystic duct lymph node.   E. LYMPH NODE, STATION 8, EXCISION:  - Two lymph nodes negative for metastatic carcinoma (0/2).   F. WHIPPLE PROCEDURE:  - Ductal adenocarcinoma, 1.6 cm.  - Carcinoma involves ampulla of Vater and periductal connective tissue.  - Perineural invasion.  - Surgical margins negative for carcinoma.  - Six lymph nodes negative for metastatic carcinoma (0/6).  - See oncology table.   G. LYMPH NODE, PORTAL, EXCISION:  - One lymph node negative for metastatic carcinoma (0/1).    10/31/2020 Cancer Staging   Staging form: Distal Bile Duct, AJCC 8th Edition - Pathologic stage from 10/31/2020: Stage I (pT1, pN0, cM0) - Signed by [name], MD on 11/23/2020 Total positive nodes: 0 Residual tumor (R): R1 - Microscopic    05/08/2021 Imaging   EXAM: CT ABDOMEN AND PELVIS WITH CONTRAST  IMPRESSION: 1. No acute findings in the abdomen or pelvis. 2. Status post Whipple procedure. No evidence for local recurrence or metastatic disease. 3. Large  stool volume. Imaging features compatible with clinical constipation. 4. Nonobstructing bilateral nephrolithiasis. 5. Aortic Atherosclerosis (ICD10-I70.0).     Current Therapy: surveillance   Interval history- [name] [name] is a 66 yo male with oncologic history as above presenting to [facility] today with chief complaint of abdominal distension and diarrhea x several months. Patient had whipple procedure 10/31/20 and states since then he has daily abdominal bloating. He denies any associated abdominal pain. He states his abdomen is the most bloated in the morning and during the day as he passes gas the bloating decreases. His significant other is at the bedside and provides additional history. She reports noticing swelling of his right abdomen that has been also going on for months, unable to remember exactly when it started. He describes his diarrhea as loose stool. When diarrhea first started it was liquid stool that was orange in color. PCP advised he stop taking metformin which he did on 06/22/21. He states despite discontinuing the metformin he still has diarrhea. He now describes it as a loose consistency that continues to be orange and he reports greasy. Per significant other the stool stains the toilet bowl. He states he has diarrhea every time he eats approximately 30 minutes-1 hour after eating. He denies any pain with defecation. Last bowel movement was this AM.  Patient denies any weight loss however states he feels like his waist is getting slimmer although his muscles are getting bigger. He has not taken any OTC medications for his symptoms prior to arrival. He denies early satiety and reports his appetite has been normal although he is fearful of  eating because of the diarrhea. Denies fever, chills, chest pain, shortness of breath, back pain, urinary symptoms, rash. Patient admits he is sober from alcohol x 19 months. Denies any new medications.     ROS  All other systems are reviewed and  are negative for acute change except as noted in the HPI.    No Known Allergies   Past Medical History:  Diagnosis Date   Alcoholism ([place])    Anxiety    Bipolar disorder (H[facility])    Cancer ([place])    COPD (chronic obstructive pulmonary disease) ([place])    Depression    " post traumatic stress disorder" sees MD in [place] every 3-6 months.   Diabetes mellitus without complication (HCC)    Dysrhythmia    hx. Bundle branch block- saw Dr. Kathlee Nations   GERD (gastroesophageal reflux disease)    Hypertension    Irregular heart rate    Sleep apnea    cpap used with nose clip- Dr. Baird Lyons follows   Substance abuse [facility])    "tends to self medicate(Rx. meds or Alcohol) trying to get relief from "PSTD"     Past Surgical History:  Procedure Laterality Date   BILIARY BRUSHING  09/23/2020   Procedure: BILIARY BRUSHING;  Surgeon: Carol Ada, MD;  Location: [facility];  Service: Endoscopy;;   BILIARY STENT PLACEMENT N/A 09/23/2020   Procedure: BILIARY STENT PLACEMENT;  Surgeon: Carol Ada, MD;  Location: [facility];  Service: Endoscopy;  Laterality: N/A;   COLONOSCOPY WITH PROPOFOL N/A 01/19/2014   Procedure: COLONOSCOPY WITH PROPOFOL;  Surgeon: Juanita Craver, MD;  Location: [facility];  Service: Endoscopy;  Laterality: N/A;   ERCP N/A 09/23/2020   Procedure: ENDOSCOPIC RETROGRADE CHOLANGIOPANCREATOGRAPHY (ERCP);  Surgeon: Carol Ada, MD;  Location: [facility];  Service: Endoscopy;  Laterality: N/A;   LAPAROSCOPY N/A 10/31/2020   Procedure: STAGING LAPAROSCOPY;  Surgeon: Dwan Bolt, MD;  Location: [place];  Service: General;  Laterality: N/A;  GEN AND TAP BLOCK   SPHINCTEROTOMY  09/23/2020   Procedure: Joan Mayans;  Surgeon: Carol Ada, MD;  Location: [facility];  Service: Endoscopy;;   WHIPPLE PROCEDURE N/A 10/31/2020   Procedure: WHIPPLE PROCEDURE, INTRAOPERATIVE ULTRASOUND;  Surgeon: Dwan Bolt, MD;  Location: [place];  Service: General;  Laterality:  N/A;   WISDOM TOOTH EXTRACTION     66 years old    Social History   Socioeconomic History   Marital status: Married    Spouse name: Not on file   Number of children: 2   Years of education: Not on file   Highest education level: Not on file  Occupational History   Not on file  Tobacco Use   Smoking status: Every Day    Packs/day: 1.50    Years: 30.00    Pack years: 45.00    Types: Cigarettes    Start date: 08/24/2017   Smokeless tobacco: Never  Vaping Use   Vaping Use: Former  Substance and Sexual Activity   Alcohol use: Not Currently    Alcohol/week: 42.0 standard drinks    Types: 42 Cans of beer per week    Comment: Quit in October 2021, used to drink heavy   Drug use: No   Sexual activity: Yes  Other Topics Concern   Not on file  Social History Narrative   Not on file   Social Determinants of Health   Financial Resource Strain: Not on file  Food Insecurity: Not on file  Transportation Needs: Not on file  Physical Activity: Not on file  Stress: Not on file  Social Connections: Not on file  Intimate Partner Violence: Not on file    Family History  Problem Relation Age of Onset   Diabetes Father    Heart Problems Father    Heart disease Father    Mental illness Father      Current Outpatient Medications:    acetaminophen (TYLENOL) 325 MG tablet, Take 2 tablets (650 mg total) by mouth every 6 (six) hours as needed for mild pain., Disp: , Rfl:    albuterol (VENTOLIN HFA) 108 (90 Base) MCG/ACT inhaler, Inhale 2 puffs into the lungs every 6 (six) hours as needed for wheezing or shortness of breath. (Patient not taking: Reported on 06/21/2021), Disp: 8 g, Rfl: 12   Ascorbic Acid (VITAMIN C) 1000 MG tablet, Take 1,000 mg by mouth daily., Disp: , Rfl:    aspirin EC 81 MG tablet, Take 81 mg by mouth daily. Swallow whole., Disp: , Rfl:    busPIRone (BUSPAR) 5 MG tablet, Take 5 mg by mouth at bedtime., Disp: , Rfl:    capecitabine (XELODA) 500 MG tablet, TAKE 3  TABLETS BY MOUTH EVERY 12 HOURS for 14 days then off 7 days, Disp: 84 tablet, Rfl: 1   Cholecalciferol (VITAMIN D) 2000 UNITS tablet, Take 2,000 Units by mouth daily., Disp: , Rfl:    CHROMIUM PO, Take 1 tablet by mouth daily., Disp: , Rfl:    clonazePAM (KLONOPIN) 0.5 MG tablet, TAKE 1 TABLET BY MOUTH TWICE DAILY AS NEEDED (Patient not taking: Reported on 06/21/2021), Disp: 60 tablet, Rfl: 5   cloNIDine (CATAPRES) 0.1 MG tablet, Take 0.1 mg by mouth 2 (two) times daily., Disp: , Rfl:    docusate sodium (COLACE) 100 MG capsule, Take 1 capsule (100 mg total) by mouth 2 (two) times daily., Disp: 60 capsule, Rfl: 0   doxylamine, Sleep, (UNISOM) 25 MG tablet, Take 25 mg by mouth at bedtime as needed for sleep., Disp: , Rfl:    ferrous sulfate 324 MG TBEC, Take 324 mg by mouth daily with breakfast., Disp: , Rfl:    icosapent Ethyl (VASCEPA) 1 g capsule, TAKE 2 CAPSULES(2 GRAMS) BY MOUTH TWICE DAILY, Disp: 360 capsule, Rfl: 1   magic mouthwash (multi-ingredient) oral suspension, Swish, gargle, and spit 5-10 mls for 1 minute. Repeat every 6 hours as needed. May swallow if throat involved., Disp: 480 mL, Rfl: 1   magnesium gluconate (MAGONATE) 500 MG tablet, Take 500 mg by mouth daily., Disp: , Rfl:    Multiple Vitamin (MULTIVITAMIN WITH MINERALS) TABS tablet, Take 1 tablet by mouth daily., Disp: , Rfl:    PARoxetine (PAXIL) 30 MG tablet, Take 60 mg by mouth at bedtime., Disp: , Rfl:    Potassium 99 MG TABS, Take 99 mg by mouth daily., Disp: , Rfl:    QUEtiapine (SEROQUEL) 50 MG tablet, Take 75 mg by mouth at bedtime., Disp: , Rfl:    tiaGABine (GABITRIL) 4 MG tablet, Take 8 mg by mouth at bedtime., Disp: , Rfl:   PHYSICAL EXAM: ECOG FS:1 - Symptomatic but completely ambulatory    Vitals:   07/19/21 1017  BP: (!) 149/81  Pulse: (!) 50  Resp: 16  Temp: 98 F (36.7 C)  TempSrc: Oral  SpO2: 100%  Weight: 159 lb 11.2 oz (72.4 kg)   Physical Exam Vitals and nursing note reviewed.  Constitutional:       Appearance: He is well-developed. He is not ill-appearing or toxic-appearing.  Comments: Thin appearing male  HENT:     Head: Normocephalic and atraumatic.     Nose: Nose normal.  Eyes:     General: No scleral icterus.       Right eye: No discharge.        Left eye: No discharge.     Conjunctiva/sclera: Conjunctivae normal.  Neck:     Vascular: No JVD.  Cardiovascular:     Rate and Rhythm: Normal rate and regular rhythm.     Pulses: Normal pulses.     Heart sounds: Normal heart sounds.  Pulmonary:     Effort: Pulmonary effort is normal.     Breath sounds: Normal breath sounds.  Abdominal:     General: A surgical scar is present. Bowel sounds are increased. There is distension (RLQ).     Palpations: Abdomen is soft.     Tenderness: There is no abdominal tenderness. There is no right CVA tenderness, left CVA tenderness, guarding or rebound.     Hernia: No hernia is present.  Musculoskeletal:        General: Normal range of motion.     Cervical back: Normal range of motion.  Skin:    General: Skin is warm and dry.  Neurological:     Mental Status: He is oriented to person, place, and time.     GCS: GCS eye subscore is 4. GCS verbal subscore is 5. GCS motor subscore is 6.     Comments: Fluent speech, no facial droop.  Psychiatric:        Behavior: Behavior normal.       LABORATORY DATA: I have reviewed the data as listed    Latest Ref Rng & Units 07/19/2021   10:20 AM 07/12/2021   10:36 AM 06/21/2021   10:05 AM  CBC  WBC 4.0 - 10.5 K/uL 4.4   5.7   4.8    Hemoglobin 13.0 - 17.0 g/dL 12.3   12.9   12.3    Hematocrit 39.0 - 52.0 % 35.5   36.1   35.6    Platelets 150 - 400 K/uL 157   192   168.0          Latest Ref Rng & Units 07/19/2021   10:20 AM 07/12/2021   10:36 AM 06/21/2021   10:05 AM  CMP  Glucose 70 - 99 mg/dL 116   104   103    BUN 8 - 23 mg/dL _0 Creatinine 0.61 - 1.24 mg/dL 0.53   0.53   0.52    Sodium 135 - 145 mmol/L 141   140   138     Potassium 3.5 - 5.1 mmol/L 3.7   4.0   4.2    Chloride 98 - 111 mmol/L 110   108   108    CO2 22 - 32 mmol/L _1 Calcium 8.9 - 10.3 mg/dL 8.4   8.4   8.0    Total Protein 6.5 - 8.1 g/dL 5.8   6.2   5.7    Total Bilirubin 0.3 - 1.2 mg/dL 0.4   0.5   0.3    Alkaline Phos 38 - 126 U/L 220   252   115    AST 15 - 41 U/L 121   140   100    ALT 0 - 44 U/L 130   144   74  RADIOGRAPHIC STUDIES: I have personally reviewed the radiological images as listed and agreed with the findings in the report. No images are attached to the encounter. US Abdomen Complete  Result Date: 07/18/2021 CLINICAL DATA:  Cholangiocarcinoma. EXAM: ABDOMEN ULTRASOUND COMPLETE COMPARISON:  CT scan of May 08, 2021. Ultrasound of September 22, 2020. FINDINGS: Gallbladder: Status post cholecystectomy. Common bile duct: Diameter: 3 mm which is within normal limits. Liver: No focal lesion identified. Increased echogenicity of hepatic parenchyma is noted suggesting hepatic steatosis. Portal vein is patent on color Doppler imaging with normal direction of blood flow towards the liver. IVC: No abnormality visualized. Pancreas: Pancreatic duct is dilated at 6 mm. No other definite pancreatic abnormality seen. Spleen: Size and appearance within normal limits. Right Kidney: Length: 13.1 cm. Echogenicity within normal limits. No mass or hydronephrosis visualized. Left Kidney: Length: 12.2 cm. Echogenicity within normal limits. No mass or hydronephrosis visualized. Abdominal aorta: No aneurysm visualized. Other findings: None. IMPRESSION: Status post cholecystectomy. Pancreatic duct is severely dilated at 6 mm. CT scan is recommended for further evaluation. Increased echogenicity of hepatic parenchyma is noted suggesting hepatic steatosis. No definite focal sonographic hepatic abnormality is noted, although CT would be more sensitive. Electronically Signed   By: Marijo Conception M.D.   On: 07/18/2021 08:19     ASSESSMENT &  PLAN: Patient is a 66 y.o. male  with oncologic history of extrahepatic cholangiocarcinoma followed by Dr. Burr Medico.  I have viewed most recent oncology note and lab work.    #)?Abdominal bloating vs distention and diarrhea- Patient is non toxic appearing. He is thin although weight is stable compared to previous visits. On exam he has distension in general area of RLQ. There is no tenderness appreciated. Patient is poor historian and it is difficult to discern when symptoms truly started. [place] ordered by oncologist after patient's significant other called asking for evaluation of elevated liver enzymes from PCP visit earlier this month. US showed pancreatic duct is severely dilated at 6 mm. Liver with increase echogenicity of hepatic parenchyma suggestive of hepatic steatosis. I viewed imaging and agree with radiologist impression.  Labs were collected today. CBC without leukocytosis, hemoglobin consistent with previous labs. Lipase within normal range. CMP show transaminitis AST 121 and ALT 130 which is improved compared to 140 and 144 x 7 days ago, alk phos also elevated yet improved 220 from 252. No significant electrolyte derangement.  CT AP obtained today with multiple findings:  -Chronic pancreatic atrophy with development of mild pancreatic duct dilatation with no dominant obstructive mass identified  -Persistent soft tissue density along the origin of the celiac. Radiologist comments this could be treatment related, local residual or recurrent disease cannot be excluded. Given constellation of findings there PET recommended.  -Colonic stool burden -suspicious gastritis at the antrum (patient asymptomatic of this)  Plan: PET Creon? Lomotil?  Visit Diagnosis: 1. Intra-abdominal and pelvic swelling, mass and lump, unspecified site   2. Abdominal pain, unspecified abdominal location      Orders Placed This Encounter  Procedures   CT Abdomen W Contrast    Standing Status:   Future     Standing Expiration Date:   07/19/2022    Order Specific Question:   If indicated for the ordered procedure, I authorize the administration of contrast media per Radiology protocol    Answer:   Yes    Order Specific Question:   Preferred imaging location?    Answer:   [facility]    Order Specific  Question:   Is Oral Contrast requested for this exam?    Answer:   Yes, Per Radiology protocol    Order Specific Question:   Call Results- Best Contact Number?    Answer:   [phone] / do not hold patient    All questions were answered. The patient knows to call the clinic with any problems, questions or concerns. No barriers to learning was detected.  I have spent a total of *** minutes minutes of face-to-face and non-face-to-face time, preparing to see the patient, obtaining and/or reviewing separately obtained history, performing a medically appropriate examination, counseling and educating the patient, ordering tests,  documenting clinical information in the electronic health record, and care coordination.     Thank you for allowing me to participate in the care of this patient.    Barrie Folk, PA-C Department of Hematology/Oncology [name] at [facility] Phone: [phone]  [phone]    07/19/2021 12:08 PM

## 2021-07-21 ENCOUNTER — Telehealth: Payer: Self-pay

## 2021-07-21 ENCOUNTER — Other Ambulatory Visit (HOSPITAL_COMMUNITY): Payer: Federal, State, Local not specified - PPO

## 2021-07-21 NOTE — Telephone Encounter (Signed)
Pt's spouse called stating the pt has not had a bowel movement since his CT Scan after drinking that contrast.  Pt's spouse would like to know what can the pt take to help with constipation.  Pt's spouse stated the pt normally goes 3 to 4 times a day but has not had 1 bowel movement since his CT Scan.  Pt's spouse stated she bought some sennosides tabs from [place] and wanted to know what else could she give the pt to help with getting a bowel movement.  Recommended trying Senna, Docusate Sodium, and Miralax TID for the next 3 days.  Also recommended pt drinking warm prune juice along with lots of fruits and dairy.  Instructed pt's spouse that once the bowel movement is achieved and the pt appears to be having regular bowel movements again to stop all three medications.  Encouraged pt to drink plenty of water and to walk as much as he can tolerate to help with moving his bowels.  Instructed pt's spouse if the pt does not have a bowel movement by Monday, 07/24/2021 to please contact Dr. Ernestina Penna office for further instructions.  Pt's spouse verbalized understanding and had no further questions at this time.

## 2021-07-21 NOTE — Telephone Encounter (Signed)
Pt's wife called stating that since patient drank oral contrast on Wednesday, he has had no output and is becoming uncomfortable. Wife stated she picked up some laxatives. Per Anda Kraft, [place], patient is to take one dose of laxative and he needs to come in to [facility] if patient has no relief. This RN advised patient's wife that [facility] is open until 4:30 and if his symptoms worsen he will need to go to the ER. [name] verbalized understanding.

## 2021-07-26 ENCOUNTER — Other Ambulatory Visit: Payer: Self-pay | Admitting: Nurse Practitioner

## 2021-07-26 ENCOUNTER — Other Ambulatory Visit: Payer: Self-pay

## 2021-07-26 ENCOUNTER — Telehealth: Payer: Self-pay

## 2021-07-26 DIAGNOSIS — C221 Intrahepatic bile duct carcinoma: Secondary | ICD-10-CM

## 2021-07-26 DIAGNOSIS — R1900 Intra-abdominal and pelvic swelling, mass and lump, unspecified site: Secondary | ICD-10-CM

## 2021-07-26 DIAGNOSIS — R109 Unspecified abdominal pain: Secondary | ICD-10-CM

## 2021-07-26 NOTE — Telephone Encounter (Signed)
Pt's spouse LVM stating that they were told that someone would be calling them regarding getting the pt's PET Scan scheduled.  Reviewed pt's chart and do see where a PET Scan was ordered for this pt only a CT Abdomen w/contrast.  Spoke with ordering provider of the CT of Abdomen [facility] PA-C) and was informed that this ordered was placed in error.  [name] [name] stated that [name] Rue, NP was going to place the order for a PET Scan.  Sent patient call message to [name] Rue, NP regarding PET Scan order.  Awaiting Lacie's response.

## 2021-07-26 NOTE — Telephone Encounter (Signed)
Called pt's spouse back to let her know Dr. Ernestina Penna office received her voicemail and will give her a call when they can contact Central Scheduling to schedule the PET Scan.  Pt's spouse verbalized understanding and wanted to confirm when the f/u appt with Dr. Burr Medico.  Informed pt's spouse that the appt is scheduled on 08/09/2021.

## 2021-08-02 ENCOUNTER — Encounter: Payer: Federal, State, Local not specified - PPO | Admitting: Internal Medicine

## 2021-08-07 ENCOUNTER — Encounter (HOSPITAL_COMMUNITY)
Admission: RE | Admit: 2021-08-07 | Discharge: 2021-08-07 | Disposition: A | Discharge status: Home / Self Care | Payer: Federal, State, Local not specified - PPO | Source: Ambulatory Visit | Attending: Nurse Practitioner | Admitting: Nurse Practitioner

## 2021-08-07 DIAGNOSIS — R109 Unspecified abdominal pain: Secondary | ICD-10-CM | POA: Insufficient documentation

## 2021-08-07 DIAGNOSIS — C221 Intrahepatic bile duct carcinoma: Secondary | ICD-10-CM | POA: Insufficient documentation

## 2021-08-07 DIAGNOSIS — R1900 Intra-abdominal and pelvic swelling, mass and lump, unspecified site: Secondary | ICD-10-CM | POA: Insufficient documentation

## 2021-08-07 LAB — GLUCOSE, CAPILLARY: Glucose-Capillary: 118 mg/dL — ABNORMAL HIGH (ref 70–99)

## 2021-08-07 MED ORDER — FLUDEOXYGLUCOSE F - 18 (FDG) INJECTION
7.5000 | Freq: Once | INTRAVENOUS | Status: AC | PRN
  Administered 2021-08-07: 7.85 via INTRAVENOUS

## 2021-08-09 ENCOUNTER — Other Ambulatory Visit: Payer: Self-pay

## 2021-08-09 ENCOUNTER — Encounter: Payer: Self-pay | Admitting: Hematology

## 2021-08-09 ENCOUNTER — Inpatient Hospital Stay (HOSPITAL_BASED_OUTPATIENT_CLINIC_OR_DEPARTMENT_OTHER): Payer: Federal, State, Local not specified - PPO | Admitting: Hematology

## 2021-08-09 ENCOUNTER — Inpatient Hospital Stay: Payer: Federal, State, Local not specified - PPO | Attending: Hematology

## 2021-08-09 VITALS — BP 138/88 | HR 52 | Temp 98.6°F | Resp 16 | Ht 67.0 in | Wt 158.3 lb

## 2021-08-09 DIAGNOSIS — R197 Diarrhea, unspecified: Secondary | ICD-10-CM | POA: Diagnosis not present

## 2021-08-09 DIAGNOSIS — C240 Malignant neoplasm of extrahepatic bile duct: Secondary | ICD-10-CM | POA: Diagnosis not present

## 2021-08-09 DIAGNOSIS — Z923 Personal history of irradiation: Secondary | ICD-10-CM | POA: Insufficient documentation

## 2021-08-09 DIAGNOSIS — I10 Essential (primary) hypertension: Secondary | ICD-10-CM | POA: Insufficient documentation

## 2021-08-09 DIAGNOSIS — C221 Intrahepatic bile duct carcinoma: Secondary | ICD-10-CM | POA: Diagnosis not present

## 2021-08-09 DIAGNOSIS — F17210 Nicotine dependence, cigarettes, uncomplicated: Secondary | ICD-10-CM | POA: Diagnosis not present

## 2021-08-09 LAB — CMP (CANCER CENTER ONLY)
ALT: 84 U/L — ABNORMAL HIGH (ref 0–44)
AST: 81 U/L — ABNORMAL HIGH (ref 15–41)
Albumin: 3.4 g/dL — ABNORMAL LOW (ref 3.5–5.0)
Alkaline Phosphatase: 190 U/L — ABNORMAL HIGH (ref 38–126)
Anion gap: 2 — ABNORMAL LOW (ref 5–15)
BUN: 12 mg/dL (ref 8–23)
CO2: 27 mmol/L (ref 22–32)
Calcium: 8.5 mg/dL — ABNORMAL LOW (ref 8.9–10.3)
Chloride: 112 mmol/L — ABNORMAL HIGH (ref 98–111)
Creatinine: 0.73 mg/dL (ref 0.61–1.24)
GFR, Estimated: >60 mL/min (ref >60)
Glucose, Bld: 102 mg/dL — ABNORMAL HIGH (ref 70–99)
Potassium: 3.8 mmol/L (ref 3.5–5.1)
Sodium: 141 mmol/L (ref 135–145)
Total Bilirubin: 0.3 mg/dL (ref 0.3–1.2)
Total Protein: 5.9 g/dL — ABNORMAL LOW (ref 6.5–8.1)

## 2021-08-09 LAB — CBC WITH DIFFERENTIAL (CANCER CENTER ONLY)
Abs Immature Granulocytes: 0 10*3/uL (ref 0.00–0.07)
Basophils Absolute: 0 10*3/uL (ref 0.0–0.1)
Basophils Relative: 1 %
Eosinophils Absolute: 0.1 10*3/uL (ref 0.0–0.5)
Eosinophils Relative: 3 %
HCT: 33.4 % — ABNORMAL LOW (ref 39.0–52.0)
Hemoglobin: 12.1 g/dL — ABNORMAL LOW (ref 13.0–17.0)
Immature Granulocytes: 0 %
Lymphocytes Relative: 11 %
Lymphs Abs: 0.5 10*3/uL — ABNORMAL LOW (ref 0.7–4.0)
MCH: 35.4 pg — ABNORMAL HIGH (ref 26.0–34.0)
MCHC: 36.2 g/dL — ABNORMAL HIGH (ref 30.0–36.0)
MCV: 97.7 fL (ref 80.0–100.0)
Monocytes Absolute: 0.4 10*3/uL (ref 0.1–1.0)
Monocytes Relative: 9 %
Neutro Abs: 3.7 10*3/uL (ref 1.7–7.7)
Neutrophils Relative %: 76 %
Platelet Count: 148 10*3/uL — ABNORMAL LOW (ref 150–400)
RBC: 3.42 MIL/uL — ABNORMAL LOW (ref 4.22–5.81)
RDW: 12.2 % (ref 11.5–15.5)
WBC Count: 4.8 10*3/uL (ref 4.0–10.5)
nRBC: 0 % (ref 0.0–0.2)

## 2021-08-09 MED ORDER — DIPHENOXYLATE-ATROPINE 2.5-0.025 MG PO TABS: 1.0000 | ORAL_TABLET | Freq: Four times a day (QID) | ORAL | 1 refills | Status: DC | PRN

## 2021-08-09 NOTE — Progress Notes (Signed)
[place]   [phone]) [phone] [phone]) [phone]   Clinic Follow up Note   Patient Care Team: Hoyt Koch, MD as PCP - General (Internal Medicine) Deneise Lever, MD as Consulting Physician (Pulmonary Disease) Dwan Bolt, MD as Consulting Physician (General Surgery) Kyung Rudd, MD as Consulting Physician (Radiation Oncology) [name], MD as Consulting Physician (Hematology) [name], MD as Consulting Physician (Hematology) Carol Ada, MD as Consulting Physician (Gastroenterology)  Date of Service:  08/09/2021  CHIEF COMPLAINT: f/u of extrahepatic cholangiocarcinoma  CURRENT THERAPY:  Surveillance  ASSESSMENT & PLAN:  [name] [name] is a 66 y.o. male with   1. Extrahepatic cholangiocarcinoma, pT1cN0M0, stage I -presented to ED on 09/22/20 with obstructive jaundice and abdominal pain; bilirubin of 15. Abdomen MRI revealed obstructing soft tissue mass in proximal common bile duct.  -emergent ERCP with bile duct brushing under Dr. Benson Norway confirmed adenocarcinoma. He is s/p CBD stent placement -PET 10/10/20 showed no evidence of nodal or metastatic disease. -baseline CEA and CA 19-9 were normal. -S/p whipple on 10/31/20 under Dr. Zenia Resides. Pathology showed: ductal adenocarcinoma, 1.6 cm, involving ampulla of Vater and periductal connective tissue; 9 negative lymph nodes (0/9). Hepatic duct margin was positive.  -He began adjuvant Xeloda '2000mg'$ /m2 q12h for day 1-14 evey 21 days on 12/05/20. Plan to give for 6 months, per BILCAP trial data. Dose decreased to '1500mg'$  BID with cycle 3 due to hand-foot syndrome. -He received adjuvant radiation therapy 02/06/21 - 03/17/21. He continued Xeloda on radiation days and completed ~05/18/21. -She has developed abdominal distention and discomfort lately, she feels is related to the gas from CPAP machine at night. -restaging PET on 08/07/21 showed NED. -labs reviewed, overall stable to improved. Plan to see him back in  2 months   2. Symptom Management: Diarrhea -he reports he is passing orange, "greasy" bile/stool that he can't control, started about months ago. -He is not able to tolerate creon -I encouraged him to use Imodium up to 8 tablets a day -We also talked about using Gas-X for bloating  -I called in lomotil for him to try.   3. History of alcohol addiction, heavy smoking  -He was alcoholic, but has stopped completely.  He has now been sober since 11/2019. -he is a heavy smoker.  Has tried to switch to vaping but ultimately returned to smoking -Continue discussing smoking cessation     PLAN: -PET scan reviewed, NED.   -I called in lomotil, he will increase Imodium for diarrhea -lab and f/u in 2 months   No problem-specific Assessment & Plan notes found for this encounter.   SUMMARY OF ONCOLOGIC HISTORY: Oncology History Overview Note  Cancer Staging Cholangiocarcinoma [facility]) Staging form: Distal Bile Duct, AJCC 8th Edition - Pathologic stage from 10/31/2020: Stage I (pT1, pN0, cM0) - Signed by [name], MD on 11/22/2020    Cholangiocarcinoma ([place])  09/22/2020 Imaging   US Abdomen  IMPRESSION: Intrahepatic biliary ductal dilation and dilated proximal common bile duct. Findings are consistent with biliary obstruction, possibly due to a common bile duct mass, incompletely evaluated by ultrasound. Recommend multiphase CT or MRCP for further evaluation.   Mildly distended gallbladder filled with material of varying echogenicity, likely sludge and tiny stones. In the absence of leukocytosis and right upper quadrant pain this is unlikely to represent cholecystitis.   Coarsened liver echogenicity with somewhat nodular left hepatic lobe, compatible with chronic liver disease.   09/22/2020 Imaging   CT AP  IMPRESSION: 1. Question bibasilar  pulmonary fibrosis. 2. Homogeneous hyperdensity fills the gallbladder lumen - likely represents gallbladder sludge. Intra and extrahepatic biliary  ductal dilatation with no main pancreatic duct dilatation. Findings are consistent with biliary obstruction, possibly due to a common bile duct mass which is incompletely evaluated on this single phase portal venous study. Recommend MRCP for further evaluation. 3. Nonspecific perirectal fat stranding with under distension of the rectum. 4. Aortic Atherosclerosis (ICD10-I70.0).   09/23/2020 Imaging   MRI Abdomen MRCP  IMPRESSION: 1. Obstructing soft tissue mass in the proximal common bile duct at or adjacent to the junction with the cystic duct, associated with moderate to severe intrahepatic biliary ductal dilatation indicating obstruction, as discussed above. 2. Biliary sludge filling the gallbladder lumen.   09/23/2020 Procedure   ERCP, Dr. Benson Norway  Impression: - The major papilla appeared normal. - A single localized biliary stricture was found in the common bile duct. The stricture was malignant appearing. - The left and right hepatic ducts and all intrahepatic branches and common bile duct were moderately dilated, with a mass causing an obstruction. - A biliary sphincterotomy was performed. - Cells for cytology obtained in the upper third of the main bile duct. - One plastic biliary stent was placed into the common bile duct.   09/23/2020 Pathology Results   A. BILIARY, BRUSHING:   FINAL MICROSCOPIC DIAGNOSIS:  - Malignant cells consistent with adenocarcinoma    10/09/2020 Initial Diagnosis   Cholangiocarcinoma ([place])   10/10/2020 PET scan   IMPRESSION: 1. No evidence of metastatic disease. Common bile duct mass, better seen and described on MR abdomen 09/23/2020. 2. Common bile duct stent in place with persistent biliary ductal dilatation and associated pneumobilia. 3. Tiny left renal stone. 4. Bladder wall thickening. 5. Aortic atherosclerosis (ICD10-I70.0). Coronary artery calcification. 6.  Emphysema (ICD10-J43.9).   10/10/2020 Imaging   CT AP  IMPRESSION: No  suspicious liver lesions. Replaced right hepatic artery arising from the SMA.   Common bile duct tumor appears unchanged compared to prior CT. Common bile duct stent place.   Findings of fibrotic interstitial lung disease in the partially visualized lower chest, could be further evaluated with dedicated high-resolution chest CT.   10/31/2020 Cancer Staging   Staging form: Distal Bile Duct, AJCC 8th Edition - Pathologic stage from 10/31/2020: Stage I (pT1, pN0, cM0) - Signed by [name], MD on 11/22/2020 Stage prefix: Initial diagnosis Total positive nodes: 0 Residual tumor (R): R1 - Microscopic   10/31/2020 Surgery   Whipple Procedure, Dr. Michaelle Birks   10/31/2020 Pathology Results   FINAL MICROSCOPIC DIAGNOSIS:   A. COMMON HEPATIC DUCT MARGIN:  - Adenocarcinoma involving bile duct.   B. NEW COMMON HEPATIC DUCT MARGIN:  - Atypical glands consistent with adenocarcinoma.   C. RIGHT HEPATIC DUCT MARGIN:  - Connective tissue with glands showing cautery artifact.  - No diagnostic malignancy identified.   D. GALLBLADDER, CHOLECYSTECTOMY:  - Benign gallbladder.  - Benign cystic duct lymph node.   E. LYMPH NODE, STATION 8, EXCISION:  - Two lymph nodes negative for metastatic carcinoma (0/2).   F. WHIPPLE PROCEDURE:  - Ductal adenocarcinoma, 1.6 cm.  - Carcinoma involves ampulla of Vater and periductal connective tissue.  - Perineural invasion.  - Surgical margins negative for carcinoma.  - Six lymph nodes negative for metastatic carcinoma (0/6).  - See oncology table.   G. LYMPH NODE, PORTAL, EXCISION:  - One lymph node negative for metastatic carcinoma (0/1).    10/31/2020 Cancer Staging   Staging form: Distal  Bile Duct, AJCC 8th Edition - Pathologic stage from 10/31/2020: Stage I (pT1, pN0, cM0) - Signed by [name], MD on 11/23/2020 Total positive nodes: 0 Residual tumor (R): R1 - Microscopic   05/08/2021 Imaging   EXAM: CT ABDOMEN AND PELVIS WITH  CONTRAST  IMPRESSION: 1. No acute findings in the abdomen or pelvis. 2. Status post Whipple procedure. No evidence for local recurrence or metastatic disease. 3. Large stool volume. Imaging features compatible with clinical constipation. 4. Nonobstructing bilateral nephrolithiasis. 5. Aortic Atherosclerosis (ICD10-I70.0).      INTERVAL HISTORY:  [name] [name] is here for a follow up of extrahepatic cholangiocarcinoma. He was last seen by me on 05/12/21. He presents to the clinic accompanied by his wife. He reports he is passing what he believes is bile-- he describes it as orange and greasy-- which is causing him to require an adult diaper.   All other systems were reviewed with the patient and are negative.  MEDICAL HISTORY:  Past Medical History:  Diagnosis Date   Alcoholism ([place])    Anxiety    Bipolar disorder ([place])    Cancer ([place])    COPD (chronic obstructive pulmonary disease) ([place])    Depression    " post traumatic stress disorder" sees MD in [place] every 3-6 months.   Diabetes mellitus without complication (HCC)    Dysrhythmia    hx. Bundle branch block- saw Dr. Kathlee Nations   GERD (gastroesophageal reflux disease)    Hypertension    Irregular heart rate    Sleep apnea    cpap used with nose clip- Dr. Baird Lyons follows   Substance abuse [facility])    "tends to self medicate(Rx. meds or Alcohol) trying to get relief from "PSTD"    SURGICAL HISTORY: Past Surgical History:  Procedure Laterality Date   BILIARY BRUSHING  09/23/2020   Procedure: BILIARY BRUSHING;  Surgeon: Carol Ada, MD;  Location: [facility];  Service: Endoscopy;;   BILIARY STENT PLACEMENT N/A 09/23/2020   Procedure: BILIARY STENT PLACEMENT;  Surgeon: Carol Ada, MD;  Location: [facility] END[facility];  Service: Endoscopy;  Laterality: N/A;   COLONOSCOPY WITH PROPOFOL N/A 01/19/2014   Procedure: COLONOSCOPY WITH PROPOFOL;  Surgeon: Juanita Craver, MD;  Location: [facility] END[facility];  Service:  Endoscopy;  Laterality: N/A;   ERCP N/A 09/23/2020   Procedure: ENDOSCOPIC RETROGRADE CHOLANGIOPANCREATOGRAPHY (ERCP);  Surgeon: Carol Ada, MD;  Location: [facility];  Service: Endoscopy;  Laterality: N/A;   LAPAROSCOPY N/A 10/31/2020   Procedure: STAGING LAPAROSCOPY;  Surgeon: Dwan Bolt, MD;  Location: [place];  Service: General;  Laterality: N/A;  GEN AND TAP BLOCK   SPHINCTEROTOMY  09/23/2020   Procedure: Joan Mayans;  Surgeon: Carol Ada, MD;  Location: [facility];  Service: Endoscopy;;   WHIPPLE PROCEDURE N/A 10/31/2020   Procedure: WHIPPLE PROCEDURE, INTRAOPERATIVE ULTRASOUND;  Surgeon: Dwan Bolt, MD;  Location: [facility];  Service: General;  Laterality: N/A;   WISDOM TOOTH EXTRACTION     66 years old    I have reviewed the social history and family history with the patient and they are unchanged from previous note.  ALLERGIES:  has No Known Allergies.  MEDICATIONS:  Current Outpatient Medications  Medication Sig Dispense Refill   diphenoxylate-atropine (LOMOTIL) 2.5-0.025 MG tablet Take 1-2 tablets by mouth 4 (four) times daily as needed for diarrhea or loose stools. 30 tablet 1   acetaminophen (TYLENOL) 325 MG tablet Take 2 tablets (650 mg total) by mouth every 6 (six) hours as needed for mild pain.  albuterol (VENTOLIN HFA) 108 (90 Base) MCG/ACT inhaler Inhale 2 puffs into the lungs every 6 (six) hours as needed for wheezing or shortness of breath. (Patient not taking: Reported on 06/21/2021) 8 g 12   Ascorbic Acid (VITAMIN C) 1000 MG tablet Take 1,000 mg by mouth daily.     aspirin EC 81 MG tablet Take 81 mg by mouth daily. Swallow whole.     busPIRone (BUSPAR) 5 MG tablet Take 5 mg by mouth at bedtime.     Cholecalciferol (VITAMIN D) 2000 UNITS tablet Take 2,000 Units by mouth daily.     CHROMIUM PO Take 1 tablet by mouth daily.     clonazePAM (KLONOPIN) 0.5 MG tablet TAKE 1 TABLET BY MOUTH TWICE DAILY AS NEEDED (Patient not taking: Reported on 06/21/2021) 60  tablet 5   cloNIDine (CATAPRES) 0.1 MG tablet Take 0.1 mg by mouth 2 (two) times daily.     doxylamine, Sleep, (UNISOM) 25 MG tablet Take 25 mg by mouth at bedtime as needed for sleep.     ferrous sulfate 324 MG TBEC Take 324 mg by mouth daily with breakfast.     icosapent Ethyl (VASCEPA) 1 g capsule TAKE 2 CAPSULES(2 GRAMS) BY MOUTH TWICE DAILY 360 capsule 1   magic mouthwash (multi-ingredient) oral suspension Swish, gargle, and spit 5-10 mls for 1 minute. Repeat every 6 hours as needed. May swallow if throat involved. 480 mL 1   magnesium gluconate (MAGONATE) 500 MG tablet Take 500 mg by mouth daily.     Multiple Vitamin (MULTIVITAMIN WITH MINERALS) TABS tablet Take 1 tablet by mouth daily.     PARoxetine (PAXIL) 30 MG tablet Take 60 mg by mouth at bedtime.     Potassium 99 MG TABS Take 99 mg by mouth daily.     QUEtiapine (SEROQUEL) 50 MG tablet Take 75 mg by mouth at bedtime.     tiaGABine (GABITRIL) 4 MG tablet Take 8 mg by mouth at bedtime.     No current facility-administered medications for this visit.    PHYSICAL EXAMINATION: ECOG PERFORMANCE STATUS: 1 - Symptomatic but completely ambulatory  Vitals:   08/09/21 1343  BP: 138/88  Pulse: (!) 52  Resp: 16  Temp: 98.6 F (37 C)  SpO2: 98%   Wt Readings from Last 3 Encounters:  08/09/21 158 lb 4.8 oz (71.8 kg)  07/19/21 159 lb 11.2 oz (72.4 kg)  06/21/21 159 lb 9.6 oz (72.4 kg)     GENERAL:alert, no distress and comfortable SKIN: skin color normal, no rashes or significant lesions EYES: normal, Conjunctiva are pink and non-injected, sclera clear  NEURO: alert & oriented x 3 with fluent speech  LABORATORY DATA:  I have reviewed the data as listed    Latest Ref Rng & Units 08/09/2021    1:03 PM 07/19/2021   10:20 AM 07/12/2021   10:36 AM  CBC  WBC 4.0 - 10.5 K/uL 4.8  4.4  5.7   Hemoglobin 13.0 - 17.0 g/dL 12.1  12.3  12.9   Hematocrit 39.0 - 52.0 % 33.4  35.5  36.1   Platelets 150 - 400 K/uL 148  157  192          Latest Ref Rng & Units 08/09/2021    1:03 PM 07/19/2021   10:20 AM 07/12/2021   10:36 AM  CMP  Glucose 70 - 99 mg/dL 102  116  104   BUN 8 - 23 mg/dL '12  10  16   '$ Creatinine 0.61 -  1.24 mg/dL 0.73  0.53  0.53   Sodium 135 - 145 mmol/L 141  141  140   Potassium 3.5 - 5.1 mmol/L 3.8  3.7  4.0   Chloride 98 - 111 mmol/L 112  110  108   CO2 22 - 32 mmol/L '27  28  28   '$ Calcium 8.9 - 10.3 mg/dL 8.5  8.4  8.4   Total Protein 6.5 - 8.1 g/dL 5.9  5.8  6.2   Total Bilirubin 0.3 - 1.2 mg/dL 0.3  0.4  0.5   Alkaline Phos 38 - 126 U/L 190  220  252   AST 15 - 41 U/L 81  121  140   ALT 0 - 44 U/L 84  130  144       RADIOGRAPHIC STUDIES: I have personally reviewed the radiological images as listed and agreed with the findings in the report. No results found.    No orders of the defined types were placed in this encounter.  All questions were answered. The patient knows to call the clinic with any problems, questions or concerns. No barriers to learning was detected. The total time spent in the appointment was 30 minutes.     [name], MD 08/09/2021   I, Wilburn Mylar, am acting as scribe for [name], MD.   I have reviewed the above documentation for accuracy and completeness, and I agree with the above.

## 2021-08-11 ENCOUNTER — Telehealth: Payer: Self-pay | Admitting: Hematology

## 2021-09-06 ENCOUNTER — Ambulatory Visit: Payer: Self-pay | Admitting: Surgery

## 2021-09-06 NOTE — H&P (Signed)
History of Present Illness: [name] [name] is a 66 y.o. male who is seen today for long-term follow up. 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 He had a focally positive proximal bile duct margin and completed adjuvant Xeloda and radiation. He has recently been having very frequent loose, oily stools, up to 5-6 times daily. 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 He has been losing weight. He has tried Creon in the past but says he had severe abdominal pain after taking it. His right inguinal hernia has also become more symptomatic and uncomfortable.   He recently had restaging CT scans on 5/31, which showed some soft tissue density around the celiac axis. This was followed with a PET/CT on 6/19, which showed no hypermetabolic activity in this area. He otherwise has no evidence of recurrent disease.     Review of Systems: A complete review of systems was obtained from the patient.  I have reviewed this information and discussed as appropriate with the patient.  See HPI as well for other ROS.       Medical History: Past Medical HistoryExpand by Default Past Medical History: Diagnosis Date  Anxiety    COPD (chronic obstructive pulmonary disease) (CMS-HCC)    Diabetes mellitus without complication (CMS-HCC)    History of cancer    Hyperlipidemia    Sleep apnea    Substance abuse (CMS-HCC)        Patient Active Problem List Diagnosis  Primary cholangiocarcinoma of extrahepatic bile duct (CMS-HCC)     Past Surgical History No past surgical history on file.     Allergies No Known Allergies    Current Outpatient Medications on File Prior to Visit Medication Sig Dispense Refill  busPIRone (BUSPAR) 5 MG tablet Take by mouth      clonazePAM (KLONOPIN) 0.5 MG tablet        cloNIDine HCL (CATAPRES) 0.1 MG tablet        folic acid (FOLVITE) 1 MG  tablet Take by mouth      magnesium oxide (MAG-OX) 400 mg (241.3 mg magnesium) tablet Take by mouth      metFORMIN (GLUCOPHAGE) 1000 MG tablet TAKE 1 TABLET(1000 MG) BY MOUTH TWICE DAILY WITH A MEAL      naltrexone microspheres (VIVITROL) 380 mg extended release IM injectable suspension Inject into the muscle      pancrelipase (CREON) 36,000-114,000-180,000 unit DR capsule Take 1 capsule by mouth 3 (three) times daily with meals 90 capsule 11  PARoxetine (PAXIL) 10 MG tablet TAKE 1 TABLET BY MOUTH EVERY NIGHT AT BEDTIME IN ADDITION TO THE 40 MG      PARoxetine (PAXIL) 40 MG tablet        rosuvastatin (CRESTOR) 20 MG tablet        tiaGABine (GABITRIL) 4 MG tablet Take by mouth       No current facility-administered medications on file prior to visit.     Family History Family History Problem Relation Age of Onset  Diabetes Father    Coronary Artery Disease (Blocked arteries around heart) Father        Social History   Tobacco Use Smoking Status Smoker, Current Status Unknown  Packs/day: 1.00  Types: Cigarettes Smokeless Tobacco Never     Social History Social History    Socioeconomic History  Marital status: Married Tobacco Use  Smoking status: Smoker, Current Status  Unknown     Packs/day: 1.00     Types: Cigarettes  Smokeless tobacco: Never Vaping Use  Vaping Use: Never used Substance and Sexual Activity  Alcohol use: Not Currently  Drug use: Defer  Sexual activity: Defer      Objective:     Vitals:   09/06/21 1108 BP: 116/72 Pulse: 63 Temp: 36.8 C (98.2 F) SpO2: 98% Weight: 68.5 kg (151 lb)   Body mass index is 21.67 kg/m.   Physical Exam Vitals reviewed.  Constitutional:      Appearance: Normal appearance.  Eyes:     General: No scleral icterus.    Conjunctiva/sclera: Conjunctivae normal.  Pulmonary:     Effort: Pulmonary effort is normal. No respiratory distress.  Abdominal:     General: There is no distension.     Palpations: Abdomen is  soft.     Tenderness: There is no abdominal tenderness.     Comments: Well-healed midline surgical scar. Large right inguinal hernia, manually reduced in the supine position.  Neurological:     General: No focal deficit present.     Mental Status: He is alert and oriented to person, place, and time.        Labs, Imaging and Diagnostic Testing: CT abd/pelvis 07/19/21: 1. Status post Whipple procedure. Chronic pancreatic atrophy with development of mild pancreatic duct dilatation, followed to the level of the pancreatic to jejunal anastomosis. No dominant obstructive mass identified. 2. Persistent soft tissue density along the origin of the celiac. Although this could be treatment related, local residual or recurrent disease cannot be excluded. Given constellation of findings, consider further evaluation with PET to exclude otherwise occult local recurrence. 3.  Possible constipation. 4. Findings suspicious for gastritis at the antrum, just proximal to the gastrojejunal anastomosis. 5. Similar trace perihepatic ascites. 6. Bilateral nephrolithiasis 7. 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   PET/CT 08/07/21: IMPRESSION: 1. No findings today to suggest hypermetabolic recurrent or metastatic disease. Specifically, there is no hypermetabolism associated with the soft tissue density described along the origin of the celiac axis on recent diagnostic CT imaging. 2. Low level FDG accumulation in the gastric wall may be related to the wall thickening/inflammation as suggested on recent CT. 3.  Aortic Atherosclerois (ICD10-170.0) 4.  Emphysema. ([id number])   CA19-9 5/24: 21   Assessment and Plan:    Diagnoses and all orders for this visit:   Right inguinal hernia   Exocrine pancreatic insufficiency   Primary cholangiocarcinoma of extrahepatic bile duct (CMS-HCC)     This is a 66 yo male with a history of stage 1 extrahepatic cholangiocarcinoma, now 10  months s/p Whipple followed by adjuvant Xeloda and radiation. - No evidence of recurrent disease on recent restaging imaging - His scans show mild new PD dilation with atrophy of the pancreas, no cystic or mass lesions. He may have a stricture of the PJ anastomosis. His oily stools are due to exocrine pancreatic insufficiency. I recommended that he resume Creon 36k 1 capsule with meals, which will likely need to be increased if symptoms do not improve at this dose. He already has Creon at home. - Symptomatic right inguinal hernia: Remains reducible without obstructive symptoms. Will schedule him for elective repair, he will be contacted with a surgery date and time. I reviewed the details of open inguinal hernia repair with mesh placement and he agrees to proceed. - Follow up after hernia repair. He also follows closely with Dr. Burr Medico for surveillance.   Michaelle Birks,  MD [facility] General, Hepatobiliary and Pancreatic Surgery 09/06/21 2:32 PM

## 2021-09-06 NOTE — H&P (View-Only) (Signed)
History of Present Illness: [name] [name] is a 66 y.o. male who is seen today for long-term follow up. 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 He had a focally positive proximal bile duct margin and completed adjuvant Xeloda and radiation. He has recently been having very frequent loose, oily stools, up to 5-6 times daily. 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 He has been losing weight. He has tried Creon in the past but says he had severe abdominal pain after taking it. His right inguinal hernia has also become more symptomatic and uncomfortable.   He recently had restaging CT scans on 5/31, which showed some soft tissue density around the celiac axis. This was followed with a PET/CT on 6/19, which showed no hypermetabolic activity in this area. He otherwise has no evidence of recurrent disease.     Review of Systems: A complete review of systems was obtained from the patient.  I have reviewed this information and discussed as appropriate with the patient.  See HPI as well for other ROS.       Medical History: Past Medical HistoryExpand by Default Past Medical History: Diagnosis Date  Anxiety    COPD (chronic obstructive pulmonary disease) (CMS-HCC)    Diabetes mellitus without complication (CMS-HCC)    History of cancer    Hyperlipidemia    Sleep apnea    Substance abuse (CMS-HCC)        Patient Active Problem List Diagnosis  Primary cholangiocarcinoma of extrahepatic bile duct (CMS-HCC)     Past Surgical History No past surgical history on file.     Allergies No Known Allergies    Current Outpatient Medications on File Prior to Visit Medication Sig Dispense Refill  busPIRone (BUSPAR) 5 MG tablet Take by mouth      clonazePAM (KLONOPIN) 0.5 MG tablet        cloNIDine HCL (CATAPRES) 0.1 MG tablet        folic acid (FOLVITE) 1 MG  tablet Take by mouth      magnesium oxide (MAG-OX) 400 mg (241.3 mg magnesium) tablet Take by mouth      metFORMIN (GLUCOPHAGE) 1000 MG tablet TAKE 1 TABLET(1000 MG) BY MOUTH TWICE DAILY WITH A MEAL      naltrexone microspheres (VIVITROL) 380 mg extended release IM injectable suspension Inject into the muscle      pancrelipase (CREON) 36,000-114,000-180,000 unit DR capsule Take 1 capsule by mouth 3 (three) times daily with meals 90 capsule 11  PARoxetine (PAXIL) 10 MG tablet TAKE 1 TABLET BY MOUTH EVERY NIGHT AT BEDTIME IN ADDITION TO THE 40 MG      PARoxetine (PAXIL) 40 MG tablet        rosuvastatin (CRESTOR) 20 MG tablet        tiaGABine (GABITRIL) 4 MG tablet Take by mouth       No current facility-administered medications on file prior to visit.     Family History Family History Problem Relation Age of Onset  Diabetes Father    Coronary Artery Disease (Blocked arteries around heart) Father        Social History   Tobacco Use Smoking Status Smoker, Current Status Unknown  Packs/day: 1.00  Types: Cigarettes Smokeless Tobacco Never     Social History Social History    Socioeconomic History  Marital status: Married Tobacco Use  Smoking status: Smoker, Current Status  Unknown     Packs/day: 1.00     Types: Cigarettes  Smokeless tobacco: Never Vaping Use  Vaping Use: Never used Substance and Sexual Activity  Alcohol use: Not Currently  Drug use: Defer  Sexual activity: Defer      Objective:     Vitals:   09/06/21 1108 BP: 116/72 Pulse: 63 Temp: 36.8 C (98.2 F) SpO2: 98% Weight: 68.5 kg (151 lb)   Body mass index is 21.67 kg/m.   Physical Exam Vitals reviewed.  Constitutional:      Appearance: Normal appearance.  Eyes:     General: No scleral icterus.    Conjunctiva/sclera: Conjunctivae normal.  Pulmonary:     Effort: Pulmonary effort is normal. No respiratory distress.  Abdominal:     General: There is no distension.     Palpations: Abdomen is  soft.     Tenderness: There is no abdominal tenderness.     Comments: Well-healed midline surgical scar. Large right inguinal hernia, manually reduced in the supine position.  Neurological:     General: No focal deficit present.     Mental Status: He is alert and oriented to person, place, and time.        Labs, Imaging and Diagnostic Testing: CT abd/pelvis 07/19/21: 1. Status post Whipple procedure. Chronic pancreatic atrophy with development of mild pancreatic duct dilatation, followed to the level of the pancreatic to jejunal anastomosis. No dominant obstructive mass identified. 2. Persistent soft tissue density along the origin of the celiac. Although this could be treatment related, local residual or recurrent disease cannot be excluded. Given constellation of findings, consider further evaluation with PET to exclude otherwise occult local recurrence. 3.  Possible constipation. 4. Findings suspicious for gastritis at the antrum, just proximal to the gastrojejunal anastomosis. 5. Similar trace perihepatic ascites. 6. Bilateral nephrolithiasis 7. 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   PET/CT 08/07/21: IMPRESSION: 1. No findings today to suggest hypermetabolic recurrent or metastatic disease. Specifically, there is no hypermetabolism associated with the soft tissue density described along the origin of the celiac axis on recent diagnostic CT imaging. 2. Low level FDG accumulation in the gastric wall may be related to the wall thickening/inflammation as suggested on recent CT. 3.  Aortic Atherosclerois (ICD10-170.0) 4.  Emphysema. (XVQ00-Q67.9)   CA19-9 5/24: 21   Assessment and Plan:    Diagnoses and all orders for this visit:   Right inguinal hernia   Exocrine pancreatic insufficiency   Primary cholangiocarcinoma of extrahepatic bile duct (CMS-HCC)     This is a 66 yo male with a history of stage 1 extrahepatic cholangiocarcinoma, now 10  months s/p Whipple followed by adjuvant Xeloda and radiation. - No evidence of recurrent disease on recent restaging imaging - His scans show mild new PD dilation with atrophy of the pancreas, no cystic or mass lesions. He may have a stricture of the PJ anastomosis. His oily stools are due to exocrine pancreatic insufficiency. I recommended that he resume Creon 36k 1 capsule with meals, which will likely need to be increased if symptoms do not improve at this dose. He already has Creon at home. - Symptomatic right inguinal hernia: Remains reducible without obstructive symptoms. Will schedule him for elective repair, he will be contacted with a surgery date and time. I reviewed the details of open inguinal hernia repair with mesh placement and he agrees to proceed. - Follow up after hernia repair. He also follows closely with Dr. Burr Medico for surveillance.   Michaelle Birks,  MD [facility], Hepatobiliary and Pancreatic Surgery 09/06/21 2:32 PM

## 2021-09-26 NOTE — Progress Notes (Addendum)
Surgical Instructions    Your procedure is scheduled on Monday, August 14th, 2023.   Report to [facility] Main Entrance "A" at 05:30 A.M., then check in with the Admitting office.  Call this number if you have problems the morning of surgery:  [phone]   If you have any questions prior to your surgery date call [phone]: Open Monday-Friday 8am-4pm    Remember:  Do not eat after midnight the night before your surgery  You may drink clear liquids until 04:30 the morning of your surgery.   Clear liquids allowed are: Water, Non-Citrus Juices (without pulp), Carbonated Beverages, Clear Tea, Black Coffee ONLY (NO MILK, CREAM OR POWDERED CREAMER of any kind), and Gatorade    Take these medicines the morning of surgery with A SIP OF WATER:   Ask your MD if you need to stop icosapent Ethyl (VASCEPA) prior your surgery   Follow your surgeon's instructions on when to stop Aspirin.  If no instructions were given by your surgeon then you will need to call the office to get those instructions.     As of today, STOP taking any Aspirin (unless otherwise instructed by your surgeon) Aleve, Naproxen, Ibuprofen, Motrin, Advil, Goody's, BC's, all herbal medications, fish oil, and all vitamins.    HOW TO MANAGE YOUR DIABETES BEFORE AND AFTER SURGERY  Why is it important to control my blood sugar before and after surgery? Improving blood sugar levels before and after surgery helps healing and can limit problems. A way of improving blood sugar control is eating a healthy diet by:  Eating less sugar and carbohydrates  Increasing activity/exercise  Talking with your doctor about reaching your blood sugar goals High blood sugars (greater than 180 mg/dL) can raise your risk of infections and slow your recovery, so you will need to focus on controlling your diabetes during the weeks before surgery. Make sure that the doctor who takes care of your diabetes knows about your planned surgery including  the date and location.  How do I manage my blood sugar before surgery? Check your blood sugar at least 4 times a day, starting 2 days before surgery, to make sure that the level is not too high or low.  Check your blood sugar the morning of your surgery when you wake up and every 2 hours until you get to the Short Stay unit.  If your blood sugar is less than 70 mg/dL, you will need to treat for low blood sugar: Do not take insulin. Treat a low blood sugar (less than 70 mg/dL) with  cup of clear juice (cranberry or apple), 4 glucose tablets, OR glucose gel. Recheck blood sugar in 15 minutes after treatment (to make sure it is greater than 70 mg/dL). If your blood sugar is not greater than 70 mg/dL on recheck, call [phone] for further instructions. Report your blood sugar to the short stay nurse when you get to Short Stay.  If you are admitted to the hospital after surgery: Your blood sugar will be checked by the staff and you will probably be given insulin after surgery (instead of oral diabetes medicines) to make sure you have good blood sugar levels. The goal for blood sugar control after surgery is 80-180 mg/dL.     The day of surgery:          Do not wear jewelry  Do not wear lotions, powders, cologne or deodorant. Men may shave face and neck. Do not bring valuables to the hospital.  [facility] is not responsible for any belongings or valuables.    Do NOT Smoke (Tobacco/Vaping)  24 hours prior to your procedure  If you use a CPAP at night, you may bring your mask for your overnight stay.   Contacts, glasses, hearing aids, dentures or partials may not be worn into surgery, please bring cases for these belongings   For patients admitted to the hospital, discharge time will be determined by your treatment team.   Patients discharged the day of surgery will not be allowed to drive home, and someone needs to stay with them for 24 hours.   SURGICAL WAITING ROOM  VISITATION Patients having surgery or a procedure may have no more than 2 support people in the waiting area - these visitors may rotate.   Children under the age of 54 must have an adult with them who is not the patient. If the patient needs to stay at the hospital during part of their recovery, the visitor guidelines for inpatient rooms apply. Pre-op nurse will coordinate an appropriate time for 1 support person to accompany patient in pre-op.  This support person may not rotate.   Please refer to the [facility] website for the visitor guidelines for Inpatients (after your surgery is over and you are in a regular room).    Special instructions:    Oral Hygiene is also important to reduce your risk of infection.  Remember - BRUSH YOUR TEETH THE MORNING OF SURGERY WITH YOUR REGULAR TOOTHPASTE   [facility]- Preparing For Surgery  Before surgery, you can play an important role. Because skin is not sterile, your skin needs to be as free of germs as possible. You can reduce the number of germs on your skin by washing with CHG (chlorahexidine gluconate) Soap before surgery.  CHG is an antiseptic cleaner which kills germs and bonds with the skin to continue killing germs even after washing.     Please do not use if you have an allergy to CHG or antibacterial soaps. If your skin becomes reddened/irritated stop using the CHG.  Do not shave (including legs and underarms) for at least 48 hours prior to first CHG shower. It is OK to shave your face.  Please follow these instructions carefully.     Shower the NIGHT BEFORE SURGERY and the MORNING OF SURGERY with CHG Soap.   If you chose to wash your hair, wash your hair first as usual with your normal shampoo. After you shampoo, rinse your hair and body thoroughly to remove the shampoo.  Then [employer] and genitals (private parts) with your normal soap and rinse thoroughly to remove soap.  After that Use CHG Soap as you would any other liquid soap.  You can apply CHG directly to the skin and wash gently with a scrungie or a clean washcloth.   Apply the CHG Soap to your body ONLY FROM THE NECK DOWN.  Do not use on open wounds or open sores. Avoid contact with your eyes, ears, mouth and genitals (private parts). Wash Face and genitals (private parts)  with your normal soap.   Wash thoroughly, paying special attention to the area where your surgery will be performed.  Thoroughly rinse your body with warm water from the neck down.  DO NOT shower/wash with your normal soap after using and rinsing off the CHG Soap.  Pat yourself dry with a CLEAN TOWEL.  Wear CLEAN PAJAMAS to bed the night before surgery  Place CLEAN SHEETS on your bed  the night before your surgery  DO NOT SLEEP WITH PETS.   Day of Surgery:  Take a shower with CHG soap. Wear Clean/Comfortable clothing the morning of surgery Do not apply any deodorants/lotions.   Remember to brush your teeth WITH YOUR REGULAR TOOTHPASTE.    If you received a COVID test during your pre-op visit, it is requested that you wear a mask when out in public, stay away from anyone that may not be feeling well, and notify your surgeon if you develop symptoms. If you have been in contact with anyone that has tested positive in the last 10 days, please notify your surgeon.    Please read over the following fact sheets that you were given.

## 2021-09-27 ENCOUNTER — Encounter (HOSPITAL_COMMUNITY)
Admission: RE | Admit: 2021-09-27 | Discharge: 2021-09-27 | Disposition: A | Discharge status: Home / Self Care | Payer: Federal, State, Local not specified - PPO | Source: Ambulatory Visit | Attending: Surgery | Admitting: Surgery

## 2021-09-27 ENCOUNTER — Other Ambulatory Visit: Payer: Self-pay

## 2021-09-27 ENCOUNTER — Encounter (HOSPITAL_COMMUNITY): Payer: Self-pay

## 2021-09-27 VITALS — BP 133/78 | HR 65 | Temp 98.1°F | Resp 17 | Ht 70.0 in | Wt 148.1 lb

## 2021-09-27 DIAGNOSIS — J449 Chronic obstructive pulmonary disease, unspecified: Secondary | ICD-10-CM | POA: Diagnosis not present

## 2021-09-27 DIAGNOSIS — Z9221 Personal history of antineoplastic chemotherapy: Secondary | ICD-10-CM | POA: Diagnosis not present

## 2021-09-27 DIAGNOSIS — Z01812 Encounter for preprocedural laboratory examination: Secondary | ICD-10-CM | POA: Insufficient documentation

## 2021-09-27 DIAGNOSIS — Z923 Personal history of irradiation: Secondary | ICD-10-CM | POA: Diagnosis not present

## 2021-09-27 DIAGNOSIS — E119 Type 2 diabetes mellitus without complications: Secondary | ICD-10-CM | POA: Insufficient documentation

## 2021-09-27 DIAGNOSIS — G4733 Obstructive sleep apnea (adult) (pediatric): Secondary | ICD-10-CM | POA: Insufficient documentation

## 2021-09-27 DIAGNOSIS — I517 Cardiomegaly: Secondary | ICD-10-CM | POA: Insufficient documentation

## 2021-09-27 DIAGNOSIS — F17210 Nicotine dependence, cigarettes, uncomplicated: Secondary | ICD-10-CM | POA: Diagnosis not present

## 2021-09-27 DIAGNOSIS — E118 Type 2 diabetes mellitus with unspecified complications: Secondary | ICD-10-CM

## 2021-09-27 HISTORY — DX: Occlusion and stenosis of bilateral carotid arteries: I65.23

## 2021-09-27 LAB — HEMOGLOBIN A1C
Hgb A1c MFr Bld: 5.1 % (ref 4.8–5.6)
Mean Plasma Glucose: 99.67 mg/dL

## 2021-09-27 LAB — GLUCOSE, CAPILLARY: Glucose-Capillary: 118 mg/dL — ABNORMAL HIGH (ref 70–99)

## 2021-09-27 NOTE — Progress Notes (Signed)
PCP - Dr. Pricilla Holm Cardiologist - Pt saw Dr. Debara Pickett 05/20/20. No f/u since. Pt said he wasn't told that he needed to f/u  PPM/ICD - denies   Chest x-ray - 10/20/20 EKG - 10/27/20 Stress Test - 10/07/12 ECHO - 09/27/12 Cardiac Cath - denies  Sleep Study - around 2020, OSA+ CPAP - nightly  DM- Type 2 Pt does not check CBG at home  Blood Thinner Instructions: n/a Aspirin Instructions: Pt states he is not taking ASA. Instructed to f/u with surgeon for instructions if he does take it.  ERAS Protcol - yes, no drink  COVID TEST- n/a   Anesthesia review: yes, cardiac hx. Pt saw Dr. Debara Pickett 05/20/20  Patient denies shortness of breath, fever, cough and chest pain at PAT appointment   All instructions explained to the patient, with a verbal understanding of the material. Patient agrees to go over the instructions while at home for a better understanding. The opportunity to ask questions was provided.

## 2021-09-28 NOTE — Progress Notes (Signed)
Anesthesia Chart Review:  Current smoker follows with pulmonology for OSA (no longer on CPAP secondary to weight loss), COPD, interstitial fibrosis.  PFTs 11/05/2019 showed minimal obstruction, no response to bronchodilator, moderate diffusion deficit.  Last seen by Dr. Annamaria Boots 04/20/2021 and doing well at that time from pulmonary standpoint.  He only has a rescue inhaler which has not needed to use.  He continues to smoke.  No longer on CPAP secondary to weight loss.  No changes made to management that time, recommended follow-up in 1 year.  History of extrahepatic cholangiocarcinoma s/p Whipple 10/31/2020 and adjuvant chemoradiation treatment.  History of EtOH abuse, reported abstinence since 11/2019.  CBC 09/22/2021 in Care Everywhere reviewed, unremarkable, hemoglobin 13.1, platelets 184.  CMP 09/22/2021 in Care Everywhere reviewed, unremarkable.  EKG 10/27/2020: Normal sinus rhythm.  Rate 74. Left axis deviation. Right bundle branch block. Minimal voltage criteria for LVH, may be normal variant ( R in aVL ). Anterior infarct , age undetermined  Carotid duplex 12/04/2017: Summary:  Right Carotid: Velocities in the right ICA are consistent with a 1-39% stenosis. Non-hemodynamically significant plaque <50% noted in the CCA. The RICA velocities remain within normal range and stable compared to  the prior exam.  Left Carotid: Velocities in the left ICA are consistent with a 1-39% stenosis. Non-hemodynamically significant plaque noted in the CCA. The LICA velocities remain within normal range and stable compared to the prior exam.  Vertebrals:  Bilateral vertebral arteries demonstrate antegrade flow.  Subclavians: Normal flow hemodynamics were seen in bilateral subclavian arteries.   TTE 09/27/2012: - Left ventricle: The cavity size was normal. There was mild    concentric hypertrophy. Systolic function was normal. Wall    motion was normal; there were no regional wall motion    abnormalities.  - Atrial  septum: No defect or patent foramen ovale was    identified.    Wynonia Musty [facility] Short Stay Center/Anesthesiology Phone [phone] 09/28/2021 10:13 AM

## 2021-09-28 NOTE — Anesthesia Preprocedure Evaluation (Addendum)
Anesthesia Evaluation  Patient identified by MRN, date of birth, ID band Patient awake    Reviewed: Allergy & Precautions, NPO status , Patient's Chart, lab work & pertinent test results  History of Anesthesia Complications Negative for: history of anesthetic complications  Airway Mallampati: III  TM Distance: >3 FB Neck ROM: Full    Dental  (+) Dental Advisory Given, Missing,    Pulmonary sleep apnea , COPD, Current Smoker,  Interstitial fibrosis   Pulmonary exam normal        Cardiovascular hypertension, Normal cardiovascular exam  EKG: RBBB   Neuro/Psych Anxiety Depression Bipolar Disorder Bilateral 1-39% carotid stenosis    GI/Hepatic Neg liver ROS, GERD  ,Cholangiocarcinoma s/p Whipple, chemo/XRT   Endo/Other  diabetes  Renal/GU negative Renal ROS  negative genitourinary   Musculoskeletal negative musculoskeletal ROS (+)   Abdominal   Peds  Hematology  (+) Blood dyscrasia, anemia ,   Anesthesia Other Findings Cr 0.73, Hgb 12.1, plts 148 (08/09/21)  Reproductive/Obstetrics                          Anesthesia Physical Anesthesia Plan  ASA: 3  Anesthesia Plan: General   Post-op Pain Management: Tylenol PO (pre-op)* and Toradol IV (intra-op)*   Induction: Intravenous  PONV Risk Score and Plan: 1 and Ondansetron, Dexamethasone, Midazolam and Treatment may vary due to age or medical condition  Airway Management Planned: LMA  Additional Equipment: None  Intra-op Plan:   Post-operative Plan: Extubation in OR  Informed Consent: I have reviewed the patients History and Physical, chart, labs and discussed the procedure including the risks, benefits and alternatives for the proposed anesthesia with the patient or authorized representative who has indicated his/her understanding and acceptance.     Dental advisory given  Plan Discussed with:   Anesthesia Plan Comments: (PAT note  by Karoline Caldwell, PA-C: Current smoker follows with pulmonology for OSA (no longer on CPAP secondary to weight loss), COPD, interstitial fibrosis.  PFTs 11/05/2019 showed minimal obstruction, no response to bronchodilator, moderate diffusion deficit.  Last seen by Dr. Annamaria Boots 04/20/2021 and doing well at that time from pulmonary standpoint.  He only has a rescue inhaler which has not needed to use.  He continues to smoke.  No longer on CPAP secondary to weight loss.  No changes made to management that time, recommended follow-up in 1 year.  History of extrahepatic cholangiocarcinoma s/p Whipple 10/31/2020 and adjuvant chemoradiation treatment.  History of EtOH abuse, reported abstinence since 11/2019.  CBC 09/22/2021 in Care Everywhere reviewed, unremarkable, hemoglobin 13.1, platelets 184.  CMP 09/22/2021 in Care Everywhere reviewed, unremarkable.  EKG 10/27/2020: Normal sinus rhythm.  Rate 74. Left axis deviation. Right bundle branch block. Minimal voltage criteria for LVH, may be normal variant ( R in aVL ). Anterior infarct , age undetermined  Carotid duplex 12/04/2017: Summary:  Right Carotid: Velocities in the right ICA are consistent with a 1-39% stenosis. Non-hemodynamically significant plaque <50% noted in the CCA. The RICA velocities remain within normal range and stable compared to  the prior exam.  Left Carotid: Velocities in the left ICA are consistent with a 1-39% stenosis. Non-hemodynamically significant plaque noted in the CCA. The LICA velocities remain within normal range and stable compared to the prior exam.  Vertebrals: Bilateral vertebral arteries demonstrate antegrade flow.  Subclavians: Normal flow hemodynamics were seen in bilateral subclavian arteries.   TTE 09/27/2012: - Left ventricle: The cavity size was normal. There was mild  concentric hypertrophy. Systolic function was normal. Wall  motion was normal; there were no regional wall motion  abnormalities.  - Atrial  septum: No defect or patent foramen ovale was  identified.  )      Anesthesia Quick Evaluation

## 2021-10-02 ENCOUNTER — Other Ambulatory Visit: Payer: Self-pay

## 2021-10-02 ENCOUNTER — Encounter (HOSPITAL_COMMUNITY)
Admission: RE | Disposition: A | Discharge status: Home / Self Care | Payer: Self-pay | Source: Ambulatory Visit | Attending: Surgery

## 2021-10-02 ENCOUNTER — Encounter (HOSPITAL_COMMUNITY): Payer: Self-pay | Admitting: Surgery

## 2021-10-02 ENCOUNTER — Ambulatory Visit (HOSPITAL_COMMUNITY)
Admission: RE | Admit: 2021-10-02 | Discharge: 2021-10-02 | Disposition: A | Discharge status: Home / Self Care | Payer: Federal, State, Local not specified - PPO | Source: Ambulatory Visit | Attending: Surgery | Admitting: Surgery

## 2021-10-02 ENCOUNTER — Ambulatory Visit (HOSPITAL_COMMUNITY): Payer: Federal, State, Local not specified - PPO | Admitting: Vascular Surgery

## 2021-10-02 DIAGNOSIS — K219 Gastro-esophageal reflux disease without esophagitis: Secondary | ICD-10-CM | POA: Insufficient documentation

## 2021-10-02 DIAGNOSIS — C221 Intrahepatic bile duct carcinoma: Secondary | ICD-10-CM | POA: Diagnosis not present

## 2021-10-02 DIAGNOSIS — K8681 Exocrine pancreatic insufficiency: Secondary | ICD-10-CM | POA: Diagnosis not present

## 2021-10-02 DIAGNOSIS — R634 Abnormal weight loss: Secondary | ICD-10-CM | POA: Insufficient documentation

## 2021-10-02 DIAGNOSIS — K4090 Unilateral inguinal hernia, without obstruction or gangrene, not specified as recurrent: Secondary | ICD-10-CM | POA: Insufficient documentation

## 2021-10-02 DIAGNOSIS — E119 Type 2 diabetes mellitus without complications: Secondary | ICD-10-CM | POA: Diagnosis not present

## 2021-10-02 DIAGNOSIS — J449 Chronic obstructive pulmonary disease, unspecified: Secondary | ICD-10-CM | POA: Insufficient documentation

## 2021-10-02 DIAGNOSIS — G4730 Sleep apnea, unspecified: Secondary | ICD-10-CM | POA: Insufficient documentation

## 2021-10-02 DIAGNOSIS — I6523 Occlusion and stenosis of bilateral carotid arteries: Secondary | ICD-10-CM | POA: Diagnosis not present

## 2021-10-02 DIAGNOSIS — Z7984 Long term (current) use of oral hypoglycemic drugs: Secondary | ICD-10-CM | POA: Diagnosis not present

## 2021-10-02 DIAGNOSIS — E118 Type 2 diabetes mellitus with unspecified complications: Secondary | ICD-10-CM | POA: Diagnosis not present

## 2021-10-02 DIAGNOSIS — F17210 Nicotine dependence, cigarettes, uncomplicated: Secondary | ICD-10-CM | POA: Diagnosis not present

## 2021-10-02 DIAGNOSIS — I10 Essential (primary) hypertension: Secondary | ICD-10-CM | POA: Insufficient documentation

## 2021-10-02 HISTORY — PX: INGUINAL HERNIA REPAIR: SHX194

## 2021-10-02 LAB — GLUCOSE, CAPILLARY: Glucose-Capillary: 136 mg/dL — ABNORMAL HIGH (ref 70–99)

## 2021-10-02 SURGERY — REPAIR, HERNIA, INGUINAL, ADULT
Anesthesia: General

## 2021-10-02 MED ORDER — ONDANSETRON HCL 4 MG/2ML IJ SOLN
INTRAMUSCULAR | Status: DC | PRN
  Administered 2021-10-02: 4 mg via INTRAVENOUS

## 2021-10-02 MED ORDER — DEXAMETHASONE SODIUM PHOSPHATE 10 MG/ML IJ SOLN
INTRAMUSCULAR | Status: AC
  Filled 2021-10-02: qty 1

## 2021-10-02 MED ORDER — ACETAMINOPHEN 500 MG PO TABS
1000.0000 mg | ORAL_TABLET | Freq: Once | ORAL | Status: AC
  Filled 2021-10-02: qty 2

## 2021-10-02 MED ORDER — 0.9 % SODIUM CHLORIDE (POUR BTL) OPTIME
TOPICAL | Status: DC | PRN
  Administered 2021-10-02: 1000 mL

## 2021-10-02 MED ORDER — BUPIVACAINE-EPINEPHRINE (PF) 0.25% -1:200000 IJ SOLN
INTRAMUSCULAR | Status: DC | PRN
  Administered 2021-10-02: 30 mL via PERINEURAL

## 2021-10-02 MED ORDER — FENTANYL CITRATE (PF) 100 MCG/2ML IJ SOLN
INTRAMUSCULAR | Status: DC | PRN
  Administered 2021-10-02: 25 ug via INTRAVENOUS
  Administered 2021-10-02: 50 ug via INTRAVENOUS
  Administered 2021-10-02: 25 ug via INTRAVENOUS

## 2021-10-02 MED ORDER — PROPOFOL 10 MG/ML IV BOLUS
INTRAVENOUS | Status: AC
  Filled 2021-10-02: qty 20

## 2021-10-02 MED ORDER — LIDOCAINE 2% (20 MG/ML) 5 ML SYRINGE
INTRAMUSCULAR | Status: AC
  Filled 2021-10-02: qty 5

## 2021-10-02 MED ORDER — EPHEDRINE SULFATE-NACL 50-0.9 MG/10ML-% IV SOSY
PREFILLED_SYRINGE | INTRAVENOUS | Status: DC | PRN
  Administered 2021-10-02 (×3): 5 mg via INTRAVENOUS
  Administered 2021-10-02: 10 mg via INTRAVENOUS
  Administered 2021-10-02 (×2): 5 mg via INTRAVENOUS
  Administered 2021-10-02: 10 mg via INTRAVENOUS
  Administered 2021-10-02: 5 mg via INTRAVENOUS

## 2021-10-02 MED ORDER — LIDOCAINE HCL (CARDIAC) PF 100 MG/5ML IV SOSY
PREFILLED_SYRINGE | INTRAVENOUS | Status: DC | PRN
  Administered 2021-10-02: 60 mg via INTRAVENOUS

## 2021-10-02 MED ORDER — CHLORHEXIDINE GLUCONATE 0.12 % MT SOLN
15.0000 mL | Freq: Once | OROMUCOSAL | Status: AC
  Administered 2021-10-02: 15 mL via OROMUCOSAL
  Filled 2021-10-02: qty 15

## 2021-10-02 MED ORDER — ACETAMINOPHEN 500 MG PO TABS
1000.0000 mg | ORAL_TABLET | ORAL | Status: AC
  Administered 2021-10-02: 1000 mg via ORAL

## 2021-10-02 MED ORDER — FENTANYL CITRATE (PF) 250 MCG/5ML IJ SOLN
INTRAMUSCULAR | Status: AC
  Filled 2021-10-02: qty 5

## 2021-10-02 MED ORDER — MIDAZOLAM HCL 2 MG/2ML IJ SOLN
INTRAMUSCULAR | Status: AC
  Filled 2021-10-02: qty 2

## 2021-10-02 MED ORDER — CEFAZOLIN SODIUM-DEXTROSE 2-4 GM/100ML-% IV SOLN
2.0000 g | INTRAVENOUS | Status: AC
  Administered 2021-10-02: 2 g via INTRAVENOUS
  Filled 2021-10-02: qty 100

## 2021-10-02 MED ORDER — OXYCODONE HCL 5 MG PO TABS: 5.0000 mg | ORAL_TABLET | Freq: Once | ORAL | Status: DC | PRN

## 2021-10-02 MED ORDER — ONDANSETRON HCL 4 MG/2ML IJ SOLN
4.0000 mg | Freq: Once | INTRAMUSCULAR | Status: DC | PRN
Start: 2021-10-02 — End: 2021-10-02

## 2021-10-02 MED ORDER — ORAL CARE MOUTH RINSE: 15.0000 mL | Freq: Once | OROMUCOSAL | Status: AC

## 2021-10-02 MED ORDER — TRAMADOL HCL 50 MG PO TABS: 50.0000 mg | ORAL_TABLET | Freq: Three times a day (TID) | ORAL | 0 refills | Status: AC | PRN

## 2021-10-02 MED ORDER — ONDANSETRON HCL 4 MG/2ML IJ SOLN
INTRAMUSCULAR | Status: AC
  Filled 2021-10-02: qty 2

## 2021-10-02 MED ORDER — OXYCODONE HCL 5 MG/5ML PO SOLN: 5.0000 mg | Freq: Once | ORAL | Status: DC | PRN

## 2021-10-02 MED ORDER — MIDAZOLAM HCL 5 MG/5ML IJ SOLN
INTRAMUSCULAR | Status: DC | PRN
  Administered 2021-10-02: 2 mg via INTRAVENOUS

## 2021-10-02 MED ORDER — BUPIVACAINE-EPINEPHRINE (PF) 0.25% -1:200000 IJ SOLN
INTRAMUSCULAR | Status: AC
  Filled 2021-10-02: qty 30

## 2021-10-02 MED ORDER — PROPOFOL 10 MG/ML IV BOLUS
INTRAVENOUS | Status: DC | PRN
  Administered 2021-10-02: 140 mg via INTRAVENOUS

## 2021-10-02 MED ORDER — FENTANYL CITRATE (PF) 100 MCG/2ML IJ SOLN: 25.0000 ug | INTRAMUSCULAR | Status: DC | PRN

## 2021-10-02 MED ORDER — EPHEDRINE 5 MG/ML INJ
INTRAVENOUS | Status: AC
  Filled 2021-10-02: qty 10

## 2021-10-02 MED ORDER — LACTATED RINGERS IV SOLN: INTRAVENOUS | Status: DC

## 2021-10-02 MED ORDER — AMISULPRIDE (ANTIEMETIC) 5 MG/2ML IV SOLN: 10.0000 mg | Freq: Once | INTRAVENOUS | Status: DC | PRN

## 2021-10-02 MED ORDER — DEXAMETHASONE SODIUM PHOSPHATE 4 MG/ML IJ SOLN
INTRAMUSCULAR | Status: DC | PRN
  Administered 2021-10-02: 10 mg via INTRAVENOUS

## 2021-10-02 SURGICAL SUPPLY — 40 items
BAG COUNTER SPONGE SURGICOUNT (BAG) ×2 IMPLANT
BLADE CLIPPER SURG (BLADE) ×1 IMPLANT
CANISTER SUCT 3000ML PPV (MISCELLANEOUS) ×1 IMPLANT
CHLORAPREP W/TINT 26 (MISCELLANEOUS) ×2 IMPLANT
COVER SURGICAL LIGHT HANDLE (MISCELLANEOUS) ×2 IMPLANT
DERMABOND ADVANCED (GAUZE/BANDAGES/DRESSINGS) ×1
DERMABOND ADVANCED .7 DNX12 (GAUZE/BANDAGES/DRESSINGS) ×1 IMPLANT
DRAIN PENROSE 1/2X12 LTX STRL (WOUND CARE) ×1 IMPLANT
DRAPE LAPAROTOMY TRNSV 102X78 (DRAPES) ×2 IMPLANT
ELECT REM PT RETURN 9FT ADLT (ELECTROSURGICAL) ×2
ELECTRODE REM PT RTRN 9FT ADLT (ELECTROSURGICAL) ×1 IMPLANT
GAUZE 4X4 16PLY ~~LOC~~+RFID DBL (SPONGE) ×2 IMPLANT
GLOVE BIO SURGEON STRL SZ 6 (GLOVE) ×2 IMPLANT
GLOVE INDICATOR 6.5 STRL GRN (GLOVE) ×2 IMPLANT
GLOVE SURG POLY MICRO LF SZ5.5 (GLOVE) ×2 IMPLANT
GLOVE SURG UNDER POLY LF SZ6 (GLOVE) ×2 IMPLANT
GOWN STRL REUS W/ TWL LRG LVL3 (GOWN DISPOSABLE) ×2 IMPLANT
GOWN STRL REUS W/TWL LRG LVL3 (GOWN DISPOSABLE) ×2
KIT BASIN OR (CUSTOM PROCEDURE TRAY) ×2 IMPLANT
KIT TURNOVER KIT B (KITS) ×2 IMPLANT
MESH ULTRAPRO 3X6 7.6X15CM (Mesh General) ×1 IMPLANT
NDL HYPO 25GX1X1/2 BEV (NEEDLE) ×1 IMPLANT
NEEDLE HYPO 25GX1X1/2 BEV (NEEDLE) ×2 IMPLANT
NS IRRIG 1000ML POUR BTL (IV SOLUTION) ×2 IMPLANT
PACK GENERAL/GYN (CUSTOM PROCEDURE TRAY) ×2 IMPLANT
PAD ARMBOARD 7.5X6 YLW CONV (MISCELLANEOUS) ×2 IMPLANT
PENCIL SMOKE EVACUATOR (MISCELLANEOUS) IMPLANT
SPIKE FLUID TRANSFER (MISCELLANEOUS) ×2 IMPLANT
SPONGE INTESTINAL PEANUT (DISPOSABLE) IMPLANT
SPONGE T-LAP 18X18 ~~LOC~~+RFID (SPONGE) ×2 IMPLANT
SUT MNCRL AB 4-0 PS2 18 (SUTURE) ×2 IMPLANT
SUT PDS AB 0 CT1 27 (SUTURE) ×1 IMPLANT
SUT SILK 2 0 SH (SUTURE) IMPLANT
SUT VIC AB 2-0 SH 27 (SUTURE) ×1
SUT VIC AB 2-0 SH 27X BRD (SUTURE) ×1 IMPLANT
SUT VIC AB 3-0 SH 27 (SUTURE) ×3
SUT VIC AB 3-0 SH 27X BRD (SUTURE) ×2 IMPLANT
SYR CONTROL 10ML LL (SYRINGE) ×2 IMPLANT
TOWEL GREEN STERILE (TOWEL DISPOSABLE) ×2 IMPLANT
TOWEL GREEN STERILE FF (TOWEL DISPOSABLE) ×2 IMPLANT

## 2021-10-02 NOTE — Op Note (Signed)
Date: 10/02/21  Patient: [name] [name] MRN: [id number]  Preoperative Diagnosis: Right inguinal hernia Postoperative Diagnosis: Same  Procedure: Open right inguinal hernia repair with mesh patch placement  Surgeon: Michaelle Birks, MD Assistant: Gwynn Burly, MD (Resident)  EBL: Minimal  Anesthesia: General endotracheal  Specimens: None  Indications: [name] is a 66 yo male who underwent a Whipple approximately 1 year ago for a distal cholangiocarcinoma. He later developed a right inguinal hernia, which has become increasingly symptomatic. He has completed adjuvant treatment and elected to proceed with repair of his hernia.  Findings: Indirect right inguinal hernia, repaired with an Ultrapro mesh patch.  Procedure details: Informed consent was obtained in the preoperative area prior to the procedure. The patient was brought to the operating room and placed on the table in the supine position. General anesthesia was induced and appropriate lines and drains were placed for intraoperative monitoring. Perioperative antibiotics were administered per SCIP guidelines. The abdomen and groin were prepped and draped in the usual sterile fashion. A pre-procedure timeout was taken verifying patient identity, surgical site and procedure to be performed.  A transverse incision was made in the right groin two fingerbreadths superior to the pubic tubercle. The subcutaneous tissue and Scarpa's fascia were divided with cautery to expose the external oblique fascia. The fascia was incised with a 15-blade scalpel and opened medially to the external inguinal ring using metzenbaum scissors, taking care to protect the ilioinguinal nerve. The cord structures were circumferentially dissected out using gentle blunt dissection, and encircled with a penrose. The ilioinguinal nerve was visualized and protected. An indirect hernia sac was identified and dissected off the cord structures using blunt dissection and  cautery. A high ligation of the sac was performed with a 3-0 vicryl suture, taking care not to injure the colon. The amputated sac was discarded, and the remaining sac was then reduced into the abdomen. A sheet of Ultrapro mesh was then brought onto the field and cut to size, with a slit to accommodate the cord structures. The mesh was secured medially to the pubic tubercle and inferiorly to the shelving edge of the inguinal ligament, using a running 0 PDS suture. Superiorly the mesh was secured to the conjoint tendon using interrupted 0 PDS. The tails of the mesh were sutured together laterally to recreate the internal inguinal ring. The surgical site appeared hemostatic. The external oblique fascia was closed with a running 3-0 Vicryl suture. Scarpa's fascia was closed with a running 3-0 Vicryl, and the skin was closed with a running subcuticular 4-0 monocryl suture. Dermabond was applied.  The patient tolerated the procedure well with no apparent complications. All counts were correct x2 at the end of the procedure. The patient was extubated and taken to PACU in stable condition.  Michaelle Birks, MD 10/02/21 8:49 AM

## 2021-10-02 NOTE — Interval H&P Note (Signed)
History and Physical Interval Note:  10/02/2021 7:27 AM  [name] [name]  has presented today for surgery, with the diagnosis of inguinal hernia.  The various methods of treatment have been discussed with the patient and family. After consideration of risks, benefits and other options for treatment, the patient has consented to  Procedure(s): open right inguinal hernia repair (N/A) as a surgical intervention.  The patient's history has been reviewed, patient examined, no change in status, stable for surgery.  I have reviewed the patient's chart and labs.  Questions were answered to the patient's satisfaction.     Dwan Bolt

## 2021-10-02 NOTE — Transfer of Care (Signed)
Immediate Anesthesia Transfer of Care Note  Patient: [name] [name]  Procedure(s) Performed: open right inguinal hernia repair with mesh  Patient Location: PACU  Anesthesia Type:General  Level of Consciousness: drowsy, patient cooperative and responds to stimulation  Airway & Oxygen Therapy: Patient Spontanous Breathing and Patient connected to face mask oxygen  Post-op Assessment: Report given to RN, Post -op Vital signs reviewed and stable and Patient moving all extremities X 4  Post vital signs: Reviewed and stable  Last Vitals:  Vitals Value Taken Time  BP 130/80 10/02/21 0845  Temp 36.7 C 10/02/21 0845  Pulse 76 10/02/21 0847  Resp 17 10/02/21 0847  SpO2 98 % 10/02/21 0847  Vitals shown include unvalidated device data.  Last Pain:  Vitals:   10/02/21 0845  TempSrc:   PainSc: Asleep         Complications: No notable events documented.

## 2021-10-02 NOTE — Anesthesia Postprocedure Evaluation (Signed)
Anesthesia Post Note  Patient: HASHIM EICHHORST  Procedure(s) Performed: open right inguinal hernia repair with mesh     Patient location during evaluation: PACU Anesthesia Type: General Level of consciousness: awake and alert Pain management: pain level controlled Vital Signs Assessment: post-procedure vital signs reviewed and stable Respiratory status: spontaneous breathing, nonlabored ventilation and respiratory function stable Cardiovascular status: blood pressure returned to baseline and stable Postop Assessment: no apparent nausea or vomiting Anesthetic complications: no   No notable events documented.  Last Vitals:  Vitals:   10/02/21 0900 10/02/21 0915  BP: 125/80 135/82  Pulse: 78 65  Resp: 16 13  Temp:  36.6 C  SpO2: 98% 95%    Last Pain:  Vitals:   10/02/21 0915  TempSrc:   PainSc: 0-No pain                 Lidia Collum

## 2021-10-02 NOTE — Discharge Instructions (Addendum)
[facility] DISCHARGE INSTRUCTIONS  Activity No heavy lifting greater than 15 pounds for 6 weeks after surgery. Ok to shower in 24 hours, but do not bathe or submerge incision underwater for 2 weeks. Do not drive while taking narcotic pain medication.  Wound Care Your incision is covered with skin glue called Dermabond. This will peel off on its own over time. You may shower and allow warm soapy water to run over your incisions. Gently pat dry. Do not submerge your incision underwater. Monitor your incision for any new redness, tenderness, or drainage. It is normal and common to have swelling around your incision and in the scrotum. You may apply ice packs as needed to help with swelling.  When to Call us: Fever greater than 100.5 New redness, drainage, or swelling at incision site Severe pain, nausea, or vomiting Difficulty urinating   Follow-up You have an appointment scheduled with Dr. Zenia Resides on October 20, 2021 at 1:50pm. This will be at the [facility] Surgery office at [address], [place], [place], [place]. Please arrive at least 15 minutes prior to your scheduled appointment time.  For questions or concerns, please call the office at [phone].

## 2021-10-02 NOTE — Anesthesia Procedure Notes (Signed)
Procedure Name: LMA Insertion Date/Time: 10/02/2021 7:44 AM  Performed by: Michele Rockers, CRNAPre-anesthesia Checklist: Patient identified, Patient being monitored, Timeout performed, Emergency Drugs available and Suction available Patient Re-evaluated:Patient Re-evaluated prior to induction Oxygen Delivery Method: Circle System Utilized Preoxygenation: Pre-oxygenation with 100% oxygen Induction Type: IV induction Ventilation: Mask ventilation without difficulty LMA: LMA inserted LMA Size: 5.0 Number of attempts: 1 Placement Confirmation: positive ETCO2 and breath sounds checked- equal and bilateral Tube secured with: Tape Dental Injury: Teeth and Oropharynx as per pre-operative assessment

## 2021-10-03 ENCOUNTER — Encounter (HOSPITAL_COMMUNITY): Payer: Self-pay | Admitting: Surgery

## 2021-10-11 ENCOUNTER — Encounter: Payer: Self-pay | Admitting: Hematology

## 2021-10-11 ENCOUNTER — Other Ambulatory Visit: Payer: Self-pay

## 2021-10-11 ENCOUNTER — Inpatient Hospital Stay: Payer: Federal, State, Local not specified - PPO

## 2021-10-11 ENCOUNTER — Inpatient Hospital Stay: Payer: Federal, State, Local not specified - PPO | Attending: Hematology | Admitting: Hematology

## 2021-10-11 VITALS — BP 141/86 | HR 61 | Temp 98.2°F | Resp 18 | Ht 70.0 in | Wt 145.9 lb

## 2021-10-11 DIAGNOSIS — E119 Type 2 diabetes mellitus without complications: Secondary | ICD-10-CM | POA: Insufficient documentation

## 2021-10-11 DIAGNOSIS — C221 Intrahepatic bile duct carcinoma: Secondary | ICD-10-CM

## 2021-10-11 DIAGNOSIS — I10 Essential (primary) hypertension: Secondary | ICD-10-CM | POA: Diagnosis not present

## 2021-10-11 DIAGNOSIS — C240 Malignant neoplasm of extrahepatic bile duct: Secondary | ICD-10-CM | POA: Diagnosis present

## 2021-10-11 DIAGNOSIS — C249 Malignant neoplasm of biliary tract, unspecified: Secondary | ICD-10-CM

## 2021-10-11 LAB — CBC WITH DIFFERENTIAL (CANCER CENTER ONLY)
Abs Immature Granulocytes: 0.01 10*3/uL (ref 0.00–0.07)
Basophils Absolute: 0 10*3/uL (ref 0.0–0.1)
Basophils Relative: 1 %
Eosinophils Absolute: 0.1 10*3/uL (ref 0.0–0.5)
Eosinophils Relative: 3 %
HCT: 35.3 % — ABNORMAL LOW (ref 39.0–52.0)
Hemoglobin: 12.7 g/dL — ABNORMAL LOW (ref 13.0–17.0)
Immature Granulocytes: 0 %
Lymphocytes Relative: 13 %
Lymphs Abs: 0.7 10*3/uL (ref 0.7–4.0)
MCH: 34.7 pg — ABNORMAL HIGH (ref 26.0–34.0)
MCHC: 36 g/dL (ref 30.0–36.0)
MCV: 96.4 fL (ref 80.0–100.0)
Monocytes Absolute: 0.5 10*3/uL (ref 0.1–1.0)
Monocytes Relative: 8 %
Neutro Abs: 4.2 10*3/uL (ref 1.7–7.7)
Neutrophils Relative %: 75 %
Platelet Count: 187 10*3/uL (ref 150–400)
RBC: 3.66 MIL/uL — ABNORMAL LOW (ref 4.22–5.81)
RDW: 13 % (ref 11.5–15.5)
WBC Count: 5.6 10*3/uL (ref 4.0–10.5)
nRBC: 0 % (ref 0.0–0.2)

## 2021-10-11 LAB — CMP (CANCER CENTER ONLY)
ALT: 90 U/L — ABNORMAL HIGH (ref 0–44)
AST: 79 U/L — ABNORMAL HIGH (ref 15–41)
Albumin: 3.3 g/dL — ABNORMAL LOW (ref 3.5–5.0)
Alkaline Phosphatase: 222 U/L — ABNORMAL HIGH (ref 38–126)
Anion gap: 5 (ref 5–15)
BUN: 15 mg/dL (ref 8–23)
CO2: 26 mmol/L (ref 22–32)
Calcium: 8.7 mg/dL — ABNORMAL LOW (ref 8.9–10.3)
Chloride: 111 mmol/L (ref 98–111)
Creatinine: 0.63 mg/dL (ref 0.61–1.24)
GFR, Estimated: >60 mL/min (ref >60)
Glucose, Bld: 88 mg/dL (ref 70–99)
Potassium: 3.7 mmol/L (ref 3.5–5.1)
Sodium: 142 mmol/L (ref 135–145)
Total Bilirubin: 0.5 mg/dL (ref 0.3–1.2)
Total Protein: 6.1 g/dL — ABNORMAL LOW (ref 6.5–8.1)

## 2021-10-11 NOTE — Progress Notes (Signed)
[place]   Telephone:([phone] Fax:([phone]) [phone]   Clinic Follow up Note   Patient Care Team: Hoyt Koch, MD as PCP - General (Internal Medicine) Deneise Lever, MD as Consulting Physician (Pulmonary Disease) Dwan Bolt, MD as Consulting Physician (General Surgery) Kyung Rudd, MD as Consulting Physician (Radiation Oncology) [name], MD as Consulting Physician (Hematology) [name], MD as Consulting Physician (Hematology) Carol Ada, MD as Consulting Physician (Gastroenterology)  Date of Service:  10/11/2021  CHIEF COMPLAINT: f/u of extrahepatic cholangiocarcinoma  CURRENT THERAPY:  Surveillance  ASSESSMENT & PLAN:  [name] [name] is a 66 y.o. male with   1. Extrahepatic cholangiocarcinoma, pT1cN0M0, stage I -presented to ED on 09/22/20 with obstructive jaundice and abdominal pain; bilirubin of 15. Abdomen MRI revealed obstructing soft tissue mass in proximal common bile duct.  -emergent ERCP with bile duct brushing under Dr. Benson Norway confirmed adenocarcinoma. He is s/p CBD stent placement -PET 10/10/20 showed no evidence of nodal or metastatic disease. -baseline CEA and CA 19-9 were normal. -S/p whipple on 10/31/20 under Dr. Zenia Resides. Pathology showed: ductal adenocarcinoma, 1.6 cm, involving ampulla of Vater and periductal connective tissue; 9 negative lymph nodes (0/9). Hepatic duct margin was positive.  -He began adjuvant Xeloda '2000mg'$ /m2 q12h for day 1-14 evey 21 days on 12/05/20. Plan to give for 6 months, per BILCAP trial data. Dose decreased to '1500mg'$  BID with cycle 3 due to hand-foot syndrome. -He received adjuvant radiation therapy 02/06/21 - 03/17/21. He continued Xeloda on radiation days and completed ~05/18/21. -restaging PET on 08/07/21 showed NED. -he underwent hernia repair 10/02/21, which has improved some of his bowel symptoms (see #2) -labs reviewed, improved off treatment. Plan for restaging scan in 01/2022.   2. Symptom  Management: Diarrhea, Gas -bowels and appetite improving since hernia surgery -he notes he is using his CPAP machine again, which is likely contributing to his intestinal gas. We again talked about using Gas-X.  -I called in lomotil for him to try.   3. History of alcohol addiction, heavy smoking  -He was alcoholic, but has stopped completely.  He has now been sober since 11/2019. -he is a heavy smoker.  Has tried to switch to vaping but ultimately returned to smoking -Continue discussing smoking cessation     PLAN: -f/u in mid 01/2022, with lab and CT several days before   No problem-specific Assessment & Plan notes found for this encounter.   SUMMARY OF ONCOLOGIC HISTORY: Oncology History Overview Note  Cancer Staging Cholangiocarcinoma [facility]) Staging form: Distal Bile Duct, AJCC 8th Edition - Pathologic stage from 10/31/2020: Stage I (pT1, pN0, cM0) - Signed by [name], MD on 11/22/2020    Cholangiocarcinoma ([place])  09/22/2020 Imaging   US Abdomen  IMPRESSION: Intrahepatic biliary ductal dilation and dilated proximal common bile duct. Findings are consistent with biliary obstruction, possibly due to a common bile duct mass, incompletely evaluated by ultrasound. Recommend multiphase CT or MRCP for further evaluation.   Mildly distended gallbladder filled with material of varying echogenicity, likely sludge and tiny stones. In the absence of leukocytosis and right upper quadrant pain this is unlikely to represent cholecystitis.   Coarsened liver echogenicity with somewhat nodular left hepatic lobe, compatible with chronic liver disease.   09/22/2020 Imaging   CT AP  IMPRESSION: 1. Question bibasilar pulmonary fibrosis. 2. Homogeneous hyperdensity fills the gallbladder lumen - likely represents gallbladder sludge. Intra and extrahepatic biliary ductal dilatation with no main pancreatic duct dilatation. Findings are consistent with biliary  obstruction, possibly due to a  common bile duct mass which is incompletely evaluated on this single phase portal venous study. Recommend MRCP for further evaluation. 3. Nonspecific perirectal fat stranding with under distension of the rectum. 4. Aortic Atherosclerosis (ICD10-I70.0).   09/23/2020 Imaging   MRI Abdomen MRCP  IMPRESSION: 1. Obstructing soft tissue mass in the proximal common bile duct at or adjacent to the junction with the cystic duct, associated with moderate to severe intrahepatic biliary ductal dilatation indicating obstruction, as discussed above. 2. Biliary sludge filling the gallbladder lumen.   09/23/2020 Procedure   ERCP, Dr. Benson Norway  Impression: - The major papilla appeared normal. - A single localized biliary stricture was found in the common bile duct. The stricture was malignant appearing. - The left and right hepatic ducts and all intrahepatic branches and common bile duct were moderately dilated, with a mass causing an obstruction. - A biliary sphincterotomy was performed. - Cells for cytology obtained in the upper third of the main bile duct. - One plastic biliary stent was placed into the common bile duct.   09/23/2020 Pathology Results   A. BILIARY, BRUSHING:   FINAL MICROSCOPIC DIAGNOSIS:  - Malignant cells consistent with adenocarcinoma    10/09/2020 Initial Diagnosis   Cholangiocarcinoma ([place])   10/10/2020 PET scan   IMPRESSION: 1. No evidence of metastatic disease. Common bile duct mass, better seen and described on MR abdomen 09/23/2020. 2. Common bile duct stent in place with persistent biliary ductal dilatation and associated pneumobilia. 3. Tiny left renal stone. 4. Bladder wall thickening. 5. Aortic atherosclerosis (ICD10-I70.0). Coronary artery calcification. 6.  Emphysema (ICD10-J43.9).   10/10/2020 Imaging   CT AP  IMPRESSION: No suspicious liver lesions. Replaced right hepatic artery arising from the SMA.   Common bile duct tumor appears unchanged compared to  prior CT. Common bile duct stent place.   Findings of fibrotic interstitial lung disease in the partially visualized lower chest, could be further evaluated with dedicated high-resolution chest CT.   10/31/2020 Cancer Staging   Staging form: Distal Bile Duct, AJCC 8th Edition - Pathologic stage from 10/31/2020: Stage I (pT1, pN0, cM0) - Signed by [name], MD on 11/22/2020 Stage prefix: Initial diagnosis Total positive nodes: 0 Residual tumor (R): R1 - Microscopic   10/31/2020 Surgery   Whipple Procedure, Dr. Michaelle Birks   10/31/2020 Pathology Results   FINAL MICROSCOPIC DIAGNOSIS:   A. COMMON HEPATIC DUCT MARGIN:  - Adenocarcinoma involving bile duct.   B. NEW COMMON HEPATIC DUCT MARGIN:  - Atypical glands consistent with adenocarcinoma.   C. RIGHT HEPATIC DUCT MARGIN:  - Connective tissue with glands showing cautery artifact.  - No diagnostic malignancy identified.   D. GALLBLADDER, CHOLECYSTECTOMY:  - Benign gallbladder.  - Benign cystic duct lymph node.   E. LYMPH NODE, STATION 8, EXCISION:  - Two lymph nodes negative for metastatic carcinoma (0/2).   F. WHIPPLE PROCEDURE:  - Ductal adenocarcinoma, 1.6 cm.  - Carcinoma involves ampulla of Vater and periductal connective tissue.  - Perineural invasion.  - Surgical margins negative for carcinoma.  - Six lymph nodes negative for metastatic carcinoma (0/6).  - See oncology table.   G. LYMPH NODE, PORTAL, EXCISION:  - One lymph node negative for metastatic carcinoma (0/1).    10/31/2020 Cancer Staging   Staging form: Distal Bile Duct, AJCC 8th Edition - Pathologic stage from 10/31/2020: Stage I (pT1, pN0, cM0) - Signed by [name], MD on 11/23/2020 Total positive nodes: 0 Residual tumor (R): R1 -  Microscopic   05/08/2021 Imaging   EXAM: CT ABDOMEN AND PELVIS WITH CONTRAST  IMPRESSION: 1. No acute findings in the abdomen or pelvis. 2. Status post Whipple procedure. No evidence for local recurrence or metastatic  disease. 3. Large stool volume. Imaging features compatible with clinical constipation. 4. Nonobstructing bilateral nephrolithiasis. 5. Aortic Atherosclerosis (ICD10-I70.0).      INTERVAL HISTORY:  [name] [name] is here for a follow up of extrahepatic cholangiocarcinoma. He was last seen by me on 08/09/21. He presents to the clinic accompanied by his wife. He reports he is having persistent digestive issues. However, since his hernia surgery, he reports his BM are quicker. He also reports gas that causes pain. He also notes photophobia in his eyes, better at night.   All other systems were reviewed with the patient and are negative.  MEDICAL HISTORY:  Past Medical History:  Diagnosis Date   Alcoholism [facility])    pt has not drank since 11/2019   Anxiety    Bilateral carotid artery stenosis    Bipolar disorder ([place])    Cancer ([place]) 2022   bile duct   COPD (chronic obstructive pulmonary disease) ([place])    Depression    " post traumatic stress disorder" sees MD in [place] every 3-6 months.   Diabetes mellitus without complication ([place])    type 2   Dysrhythmia    hx. Bundle branch block- saw Dr. Kathlee Nations   GERD (gastroesophageal reflux disease)    Hypertension    Irregular heart rate    Sleep apnea    cpap used with nose clip- Dr. Baird Lyons follows   Substance abuse [facility])    "tends to self medicate(Rx. meds or Alcohol) trying to get relief from "PSTD"    SURGICAL HISTORY: Past Surgical History:  Procedure Laterality Date   BILIARY BRUSHING  09/23/2020   Procedure: BILIARY BRUSHING;  Surgeon: Carol Ada, MD;  Location: [facility];  Service: Endoscopy;;   BILIARY STENT PLACEMENT N/A 09/23/2020   Procedure: BILIARY STENT PLACEMENT;  Surgeon: Carol Ada, MD;  Location: [facility];  Service: Endoscopy;  Laterality: N/A;   CHOLECYSTECTOMY     removed during whipple procedure   COLONOSCOPY WITH PROPOFOL N/A 01/19/2014   Procedure: COLONOSCOPY WITH  PROPOFOL;  Surgeon: Juanita Craver, MD;  Location: [facility];  Service: Endoscopy;  Laterality: N/A;   ERCP N/A 09/23/2020   Procedure: ENDOSCOPIC RETROGRADE CHOLANGIOPANCREATOGRAPHY (ERCP);  Surgeon: Carol Ada, MD;  Location: [facility];  Service: Endoscopy;  Laterality: N/A;   INGUINAL HERNIA REPAIR N/A 10/02/2021   Procedure: open right inguinal hernia repair with mesh;  Surgeon: Dwan Bolt, MD;  Location: [place];  Service: General;  Laterality: N/A;   LAPAROSCOPY N/A 10/31/2020   Procedure: STAGING LAPAROSCOPY;  Surgeon: Dwan Bolt, MD;  Location: [place];  Service: General;  Laterality: N/A;  GEN AND TAP BLOCK   SPHINCTEROTOMY  09/23/2020   Procedure: Joan Mayans;  Surgeon: Carol Ada, MD;  Location: [facility];  Service: Endoscopy;;   WHIPPLE PROCEDURE N/A 10/31/2020   Procedure: WHIPPLE PROCEDURE, INTRAOPERATIVE ULTRASOUND;  Surgeon: Dwan Bolt, MD;  Location: [place];  Service: General;  Laterality: N/A;   WISDOM TOOTH EXTRACTION     66 years old    I have reviewed the social history and family history with the patient and they are unchanged from previous note.  ALLERGIES:  has No Known Allergies.  MEDICATIONS:  Current Outpatient Medications  Medication Sig Dispense Refill   aspirin EC 81  MG tablet Take 81 mg by mouth in the morning. Swallow whole.     busPIRone (BUSPAR) 5 MG tablet Take 5 mg by mouth at bedtime.     magic mouthwash (multi-ingredient) oral suspension Swish, gargle, and spit 5-10 mls for 1 minute. Repeat every 6 hours as needed. May swallow if throat involved. 480 mL 1   Multiple Vitamin (MULTIVITAMIN WITH MINERALS) TABS tablet Take 1 tablet by mouth daily.     Pancrelipase, Lip-Prot-Amyl, 4200-14200 units CPEP Take by mouth.     PARoxetine (PAXIL) 30 MG tablet Take 60 mg by mouth at bedtime.     QUEtiapine (SEROQUEL) 50 MG tablet Take 75 mg by mouth at bedtime.     tiaGABine (GABITRIL) 4 MG tablet Take 8 mg by mouth at bedtime.     No  current facility-administered medications for this visit.    PHYSICAL EXAMINATION: ECOG PERFORMANCE STATUS: 1 - Symptomatic but completely ambulatory  Vitals:   10/11/21 1443  BP: (!) 141/86  Pulse: 61  Resp: 18  Temp: 98.2 F (36.8 C)  SpO2: 98%   Wt Readings from Last 3 Encounters:  10/11/21 145 lb 14.4 oz (66.2 kg)  09/27/21 148 lb 1.6 oz (67.2 kg)  08/09/21 158 lb 4.8 oz (71.8 kg)     GENERAL:alert, no distress and comfortable SKIN: skin color normal, no rashes or significant lesions EYES: normal, Conjunctiva are pink and non-injected, sclera clear  NEURO: alert & oriented x 3 with fluent speech  LABORATORY DATA:  I have reviewed the data as listed    Latest Ref Rng & Units 10/11/2021    2:17 PM 08/09/2021    1:03 PM 07/19/2021   10:20 AM  CBC  WBC 4.0 - 10.5 K/uL 5.6  4.8  4.4   Hemoglobin 13.0 - 17.0 g/dL 12.7  12.1  12.3   Hematocrit 39.0 - 52.0 % 35.3  33.4  35.5   Platelets 150 - 400 K/uL 187  148  157         Latest Ref Rng & Units 10/11/2021    2:17 PM 08/09/2021    1:03 PM 07/19/2021   10:20 AM  CMP  Glucose 70 - 99 mg/dL 88  102  116   BUN 8 - 23 mg/dL '15  12  10   '$ Creatinine 0.61 - 1.24 mg/dL 0.63  0.73  0.53   Sodium 135 - 145 mmol/L 142  141  141   Potassium 3.5 - 5.1 mmol/L 3.7  3.8  3.7   Chloride 98 - 111 mmol/L 111  112  110   CO2 22 - 32 mmol/L '26  27  28   '$ Calcium 8.9 - 10.3 mg/dL 8.7  8.5  8.4   Total Protein 6.5 - 8.1 g/dL 6.1  5.9  5.8   Total Bilirubin 0.3 - 1.2 mg/dL 0.5  0.3  0.4   Alkaline Phos 38 - 126 U/L 222  190  220   AST 15 - 41 U/L 79  81  121   ALT 0 - 44 U/L 90  84  130       RADIOGRAPHIC STUDIES: I have personally reviewed the radiological images as listed and agreed with the findings in the report. No results found.    Orders Placed This Encounter  Procedures   CT CHEST ABDOMEN PELVIS W CONTRAST    Standing Status:   Future    Standing Expiration Date:   10/12/2022    Order Specific Question:  Preferred  imaging location?    Answer:   [facility]    Order Specific Question:   Is Oral Contrast requested for this exam?    Answer:   Yes, Per Radiology protocol   All questions were answered. The patient knows to call the clinic with any problems, questions or concerns. No barriers to learning was detected. The total time spent in the appointment was 30 minutes.     [name], MD 10/11/2021   I, Wilburn Mylar, am acting as scribe for [name], MD.   I have reviewed the above documentation for accuracy and completeness, and I agree with the above.

## 2021-10-12 LAB — CANCER ANTIGEN 19-9: CA 19-9: 39 U/mL — ABNORMAL HIGH (ref 0–35)

## 2021-10-17 ENCOUNTER — Telehealth: Payer: Self-pay | Admitting: Hematology

## 2021-10-17 ENCOUNTER — Other Ambulatory Visit: Payer: Self-pay

## 2021-10-17 DIAGNOSIS — C221 Intrahepatic bile duct carcinoma: Secondary | ICD-10-CM

## 2021-10-17 DIAGNOSIS — C249 Malignant neoplasm of biliary tract, unspecified: Secondary | ICD-10-CM

## 2021-10-17 NOTE — Telephone Encounter (Signed)
Scheduled follow-up appointments per 8/23 los. [name] is aware.

## 2021-11-08 ENCOUNTER — Inpatient Hospital Stay: Payer: Federal, State, Local not specified - PPO | Attending: Hematology

## 2021-11-08 ENCOUNTER — Other Ambulatory Visit: Payer: Self-pay

## 2021-11-08 DIAGNOSIS — C240 Malignant neoplasm of extrahepatic bile duct: Secondary | ICD-10-CM | POA: Diagnosis present

## 2021-11-08 DIAGNOSIS — R978 Other abnormal tumor markers: Secondary | ICD-10-CM | POA: Insufficient documentation

## 2021-11-08 DIAGNOSIS — C221 Intrahepatic bile duct carcinoma: Secondary | ICD-10-CM

## 2021-11-08 DIAGNOSIS — C249 Malignant neoplasm of biliary tract, unspecified: Secondary | ICD-10-CM

## 2021-11-10 LAB — CANCER ANTIGEN 19-9: CA 19-9: 57 U/mL — ABNORMAL HIGH (ref 0–35)

## 2021-11-14 ENCOUNTER — Telehealth: Payer: Self-pay | Admitting: Hematology

## 2021-11-14 NOTE — Telephone Encounter (Signed)
Left message with rescheduled upcoming appointment per 9/26 secure chat.

## 2021-11-21 ENCOUNTER — Other Ambulatory Visit: Payer: Self-pay

## 2021-11-21 ENCOUNTER — Ambulatory Visit (HOSPITAL_COMMUNITY)
Admission: RE | Admit: 2021-11-21 | Discharge: 2021-11-21 | Disposition: A | Discharge status: Home / Self Care | Payer: Federal, State, Local not specified - PPO | Source: Ambulatory Visit | Attending: Hematology | Admitting: Hematology

## 2021-11-21 ENCOUNTER — Inpatient Hospital Stay: Payer: Federal, State, Local not specified - PPO | Attending: Hematology

## 2021-11-21 ENCOUNTER — Encounter (HOSPITAL_COMMUNITY): Payer: Self-pay

## 2021-11-21 DIAGNOSIS — C221 Intrahepatic bile duct carcinoma: Secondary | ICD-10-CM | POA: Insufficient documentation

## 2021-11-21 DIAGNOSIS — I10 Essential (primary) hypertension: Secondary | ICD-10-CM | POA: Insufficient documentation

## 2021-11-21 DIAGNOSIS — C240 Malignant neoplasm of extrahepatic bile duct: Secondary | ICD-10-CM | POA: Insufficient documentation

## 2021-11-21 DIAGNOSIS — F17210 Nicotine dependence, cigarettes, uncomplicated: Secondary | ICD-10-CM | POA: Diagnosis not present

## 2021-11-21 DIAGNOSIS — E119 Type 2 diabetes mellitus without complications: Secondary | ICD-10-CM | POA: Insufficient documentation

## 2021-11-21 DIAGNOSIS — N132 Hydronephrosis with renal and ureteral calculous obstruction: Secondary | ICD-10-CM | POA: Insufficient documentation

## 2021-11-21 DIAGNOSIS — Z923 Personal history of irradiation: Secondary | ICD-10-CM | POA: Insufficient documentation

## 2021-11-21 DIAGNOSIS — C249 Malignant neoplasm of biliary tract, unspecified: Secondary | ICD-10-CM

## 2021-11-21 LAB — CMP (CANCER CENTER ONLY)
ALT: 105 U/L — ABNORMAL HIGH (ref 0–44)
AST: 85 U/L — ABNORMAL HIGH (ref 15–41)
Albumin: 3.4 g/dL — ABNORMAL LOW (ref 3.5–5.0)
Alkaline Phosphatase: 319 U/L — ABNORMAL HIGH (ref 38–126)
Anion gap: 3 — ABNORMAL LOW (ref 5–15)
BUN: 16 mg/dL (ref 8–23)
CO2: 29 mmol/L (ref 22–32)
Calcium: 8.2 mg/dL — ABNORMAL LOW (ref 8.9–10.3)
Chloride: 110 mmol/L (ref 98–111)
Creatinine: 0.5 mg/dL — ABNORMAL LOW (ref 0.61–1.24)
GFR, Estimated: >60 mL/min (ref >60)
Glucose, Bld: 107 mg/dL — ABNORMAL HIGH (ref 70–99)
Potassium: 3.8 mmol/L (ref 3.5–5.1)
Sodium: 142 mmol/L (ref 135–145)
Total Bilirubin: 0.3 mg/dL (ref 0.3–1.2)
Total Protein: 5.7 g/dL — ABNORMAL LOW (ref 6.5–8.1)

## 2021-11-21 LAB — CBC WITH DIFFERENTIAL (CANCER CENTER ONLY)
Abs Immature Granulocytes: 0.02 10*3/uL (ref 0.00–0.07)
Basophils Absolute: 0 10*3/uL (ref 0.0–0.1)
Basophils Relative: 1 %
Eosinophils Absolute: 0.2 10*3/uL (ref 0.0–0.5)
Eosinophils Relative: 4 %
HCT: 35.2 % — ABNORMAL LOW (ref 39.0–52.0)
Hemoglobin: 12.3 g/dL — ABNORMAL LOW (ref 13.0–17.0)
Immature Granulocytes: 0 %
Lymphocytes Relative: 7 %
Lymphs Abs: 0.4 10*3/uL — ABNORMAL LOW (ref 0.7–4.0)
MCH: 34.8 pg — ABNORMAL HIGH (ref 26.0–34.0)
MCHC: 34.9 g/dL (ref 30.0–36.0)
MCV: 99.7 fL (ref 80.0–100.0)
Monocytes Absolute: 0.5 10*3/uL (ref 0.1–1.0)
Monocytes Relative: 8 %
Neutro Abs: 5.1 10*3/uL (ref 1.7–7.7)
Neutrophils Relative %: 80 %
Platelet Count: 183 10*3/uL (ref 150–400)
RBC: 3.53 MIL/uL — ABNORMAL LOW (ref 4.22–5.81)
RDW: 13.5 % (ref 11.5–15.5)
WBC Count: 6.3 10*3/uL (ref 4.0–10.5)
nRBC: 0 % (ref 0.0–0.2)

## 2021-11-21 MED ORDER — IOHEXOL 300 MG/ML  SOLN
100.0000 mL | Freq: Once | INTRAMUSCULAR | Status: AC | PRN
  Administered 2021-11-21: 100 mL via INTRAVENOUS

## 2021-11-21 MED ORDER — SODIUM CHLORIDE (PF) 0.9 % IJ SOLN
INTRAMUSCULAR | Status: AC
  Filled 2021-11-21: qty 50

## 2021-11-23 ENCOUNTER — Other Ambulatory Visit: Payer: Self-pay

## 2021-11-23 ENCOUNTER — Encounter: Payer: Self-pay | Admitting: Hematology

## 2021-11-23 ENCOUNTER — Inpatient Hospital Stay: Payer: Federal, State, Local not specified - PPO | Admitting: Hematology

## 2021-11-23 VITALS — BP 155/88 | HR 59 | Temp 98.1°F | Resp 18 | Ht 70.0 in | Wt 153.6 lb

## 2021-11-23 DIAGNOSIS — C240 Malignant neoplasm of extrahepatic bile duct: Secondary | ICD-10-CM | POA: Diagnosis not present

## 2021-11-23 DIAGNOSIS — N200 Calculus of kidney: Secondary | ICD-10-CM | POA: Diagnosis not present

## 2021-11-23 DIAGNOSIS — C221 Intrahepatic bile duct carcinoma: Secondary | ICD-10-CM | POA: Diagnosis not present

## 2021-11-23 LAB — CANCER ANTIGEN 19-9: CA 19-9: 75 U/mL — ABNORMAL HIGH (ref 0–35)

## 2021-11-23 NOTE — Progress Notes (Signed)
[name] [name]   [phone] [phone]   Clinic Follow up Note   Patient Care Team: Hoyt Koch, MD as PCP - General (Internal Medicine) Deneise Lever, MD as Consulting Physician (Pulmonary Disease) Dwan Bolt, MD as Consulting Physician (General Surgery) Kyung Rudd, MD as Consulting Physician (Radiation Oncology) [name], MD as Consulting Physician (Hematology) [name], MD as Consulting Physician (Hematology) Carol Ada, MD as Consulting Physician (Gastroenterology)  Date of Service:  11/23/2021  CHIEF COMPLAINT: f/u of extrahepatic cholangiocarcinoma  CURRENT THERAPY:  Surveillance  ASSESSMENT & PLAN:  [name] [name] is a 66 y.o. male with   1. Extrahepatic cholangiocarcinoma, pT1cN0M0, stage I -presented to ED on 09/22/20 with obstructive jaundice and abdominal pain; bilirubin of 15. Abdomen MRI revealed obstructing soft tissue mass in proximal common bile duct.  -emergent ERCP with bile duct brushing under Dr. Benson Norway confirmed adenocarcinoma. He is s/p CBD stent placement -PET 10/10/20 showed no evidence of nodal or metastatic disease. -baseline CEA and CA 19-9 were normal. -S/p whipple on 10/31/20 under Dr. Zenia Resides. Pathology showed: ductal adenocarcinoma, 1.6 cm, involving ampulla of Vater and periductal connective tissue; 9 negative lymph nodes (0/9). Hepatic duct margin was positive.  -He began adjuvant Xeloda '2000mg'$ /m2 q12h for day 1-14 evey 21 days on 12/05/20. Plan to give for 6 months, per BILCAP trial data. Dose decreased to '1500mg'$  BID with cycle 3 due to hand-foot syndrome. -He received adjuvant radiation therapy 02/06/21 - 03/17/21. He continued Xeloda on radiation days and completed ~05/18/21. -his CA 19-9 rose to 39 on 10/11/21 and 57 on 11/08/21, prompting restaging CT CAP. Performed on 11/21/21, this showed: subtle/equivocal 8 mm right hepatic lobe lesion; postoperative soft tissue thickening within retroperitoneum; new  mild right hydronephrosis, secondary to 5 mm ureter stone. Repeat CA 19-9 from that day was up to 75.  Of note, his CA 19.9 was normal when he was diagnosed with cancer. -I reviewed the results with them today. His imaging is overall reassuring.  He is clinically doing better.  I recommend repeating labs (including tumor marker) in a month and a scan with an MRI in 2-3 months to monitor the liver lesion. He is agreeable.   2. Symptom Management: Diarrhea, Gas -bowels and appetite improving since hernia surgery -he reports some abdominal pain from intestinal gas secondary to air intake with CPAP machine at night. He notes it resolves after a few hours. -he is on pancrelipase, which he reports has helped a lot.   3. Bladder and kidney stones -seen on CT 11/21/21. -he previously saw Dr. Matilde Sprang in 07/2020 for urinary hesitancy -I will refer him back for further work up.  4. History of alcohol addiction, heavy smoking  -He was alcoholic, but has stopped completely.  He has now been sober since 11/2019. -he is a heavy smoker.  Has tried to switch to vaping but ultimately returned to smoking -Continue discussing smoking cessation     PLAN: -referral back to urology, I gave him their office number to call  -lab in 1 month -f/u in 2 months, with lab and MRI several days before   No problem-specific Assessment & Plan notes found for this encounter.   SUMMARY OF ONCOLOGIC HISTORY: Oncology History Overview Note  Cancer Staging Cholangiocarcinoma [facility]) Staging form: Distal Bile Duct, AJCC 8th Edition - Pathologic stage from 10/31/2020: Stage I (pT1, pN0, cM0) - Signed by [name], MD on 11/22/2020    Cholangiocarcinoma ([place])  09/22/2020 Imaging  US Abdomen  IMPRESSION: Intrahepatic biliary ductal dilation and dilated proximal common bile duct. Findings are consistent with biliary obstruction, possibly due to a common bile duct mass, incompletely evaluated by ultrasound. Recommend  multiphase CT or MRCP for further evaluation.   Mildly distended gallbladder filled with material of varying echogenicity, likely sludge and tiny stones. In the absence of leukocytosis and right upper quadrant pain this is unlikely to represent cholecystitis.   Coarsened liver echogenicity with somewhat nodular left hepatic lobe, compatible with chronic liver disease.   09/22/2020 Imaging   CT AP  IMPRESSION: 1. Question bibasilar pulmonary fibrosis. 2. Homogeneous hyperdensity fills the gallbladder lumen - likely represents gallbladder sludge. Intra and extrahepatic biliary ductal dilatation with no main pancreatic duct dilatation. Findings are consistent with biliary obstruction, possibly due to a common bile duct mass which is incompletely evaluated on this single phase portal venous study. Recommend MRCP for further evaluation. 3. Nonspecific perirectal fat stranding with under distension of the rectum. 4. Aortic Atherosclerosis (ICD10-I70.0).   09/23/2020 Imaging   MRI Abdomen MRCP  IMPRESSION: 1. Obstructing soft tissue mass in the proximal common bile duct at or adjacent to the junction with the cystic duct, associated with moderate to severe intrahepatic biliary ductal dilatation indicating obstruction, as discussed above. 2. Biliary sludge filling the gallbladder lumen.   09/23/2020 Procedure   ERCP, Dr. Benson Norway  Impression: - The major papilla appeared normal. - A single localized biliary stricture was found in the common bile duct. The stricture was malignant appearing. - The left and right hepatic ducts and all intrahepatic branches and common bile duct were moderately dilated, with a mass causing an obstruction. - A biliary sphincterotomy was performed. - Cells for cytology obtained in the upper third of the main bile duct. - One plastic biliary stent was placed into the common bile duct.   09/23/2020 Pathology Results   A. BILIARY, BRUSHING:   FINAL MICROSCOPIC  DIAGNOSIS:  - Malignant cells consistent with adenocarcinoma    10/09/2020 Initial Diagnosis   Cholangiocarcinoma ([place])   10/10/2020 PET scan   IMPRESSION: 1. No evidence of metastatic disease. Common bile duct mass, better seen and described on MR abdomen 09/23/2020. 2. Common bile duct stent in place with persistent biliary ductal dilatation and associated pneumobilia. 3. Tiny left renal stone. 4. Bladder wall thickening. 5. Aortic atherosclerosis (ICD10-I70.0). Coronary artery calcification. 6.  Emphysema (ICD10-J43.9).   10/10/2020 Imaging   CT AP  IMPRESSION: No suspicious liver lesions. Replaced right hepatic artery arising from the SMA.   Common bile duct tumor appears unchanged compared to prior CT. Common bile duct stent place.   Findings of fibrotic interstitial lung disease in the partially visualized lower chest, could be further evaluated with dedicated high-resolution chest CT.   10/31/2020 Cancer Staging   Staging form: Distal Bile Duct, AJCC 8th Edition - Pathologic stage from 10/31/2020: Stage I (pT1, pN0, cM0) - Signed by [name], MD on 11/22/2020 Stage prefix: Initial diagnosis Total positive nodes: 0 Residual tumor (R): R1 - Microscopic   10/31/2020 Surgery   Whipple Procedure, Dr. Michaelle Birks   10/31/2020 Pathology Results   FINAL MICROSCOPIC DIAGNOSIS:   A. COMMON HEPATIC DUCT MARGIN:  - Adenocarcinoma involving bile duct.   B. NEW COMMON HEPATIC DUCT MARGIN:  - Atypical glands consistent with adenocarcinoma.   C. RIGHT HEPATIC DUCT MARGIN:  - Connective tissue with glands showing cautery artifact.  - No diagnostic malignancy identified.   D. GALLBLADDER, CHOLECYSTECTOMY:  - Benign gallbladder.  -  Benign cystic duct lymph node.   E. LYMPH NODE, STATION 8, EXCISION:  - Two lymph nodes negative for metastatic carcinoma (0/2).   F. WHIPPLE PROCEDURE:  - Ductal adenocarcinoma, 1.6 cm.  - Carcinoma involves ampulla of Vater and periductal  connective tissue.  - Perineural invasion.  - Surgical margins negative for carcinoma.  - Six lymph nodes negative for metastatic carcinoma (0/6).  - See oncology table.   G. LYMPH NODE, PORTAL, EXCISION:  - One lymph node negative for metastatic carcinoma (0/1).    10/31/2020 Cancer Staging   Staging form: Distal Bile Duct, AJCC 8th Edition - Pathologic stage from 10/31/2020: Stage I (pT1, pN0, cM0) - Signed by [name], MD on 11/23/2020 Total positive nodes: 0 Residual tumor (R): R1 - Microscopic   05/08/2021 Imaging   EXAM: CT ABDOMEN AND PELVIS WITH CONTRAST  IMPRESSION: 1. No acute findings in the abdomen or pelvis. 2. Status post Whipple procedure. No evidence for local recurrence or metastatic disease. 3. Large stool volume. Imaging features compatible with clinical constipation. 4. Nonobstructing bilateral nephrolithiasis. 5. Aortic Atherosclerosis (ICD10-I70.0).      INTERVAL HISTORY:  [name] [name] is here for a follow up of extrahepatic cholangiocarcinoma. He was last seen by me on 10/11/21. He presents to the clinic accompanied by his family. He reports he is doing well overall. He denies pain, except for abdominal pain right after he wakes up due to air intake secondary to CPAP machine. He has a ureteral stone on CT. He notes he has intermittent issues with urine output.   All other systems were reviewed with the patient and are negative.  MEDICAL HISTORY:  Past Medical History:  Diagnosis Date   Alcoholism [facility])    pt has not drank since 11/2019   Anxiety    Bilateral carotid artery stenosis    Bipolar disorder ([place])    Cancer ([place]) 2022   bile duct   COPD (chronic obstructive pulmonary disease) ([place])    Depression    " post traumatic stress disorder" sees MD in [place] every 3-6 months.   Diabetes mellitus without complication ([place])    type 2   Dysrhythmia    hx. Bundle branch block- saw Dr. Kathlee Nations   GERD (gastroesophageal reflux  disease)    Hypertension    Irregular heart rate    Sleep apnea    cpap used with nose clip- Dr. Baird Lyons follows   Substance abuse [facility])    "tends to self medicate(Rx. meds or Alcohol) trying to get relief from "PSTD"    SURGICAL HISTORY: Past Surgical History:  Procedure Laterality Date   BILIARY BRUSHING  09/23/2020   Procedure: BILIARY BRUSHING;  Surgeon: Carol Ada, MD;  Location: [facility];  Service: Endoscopy;;   BILIARY STENT PLACEMENT N/A 09/23/2020   Procedure: BILIARY STENT PLACEMENT;  Surgeon: Carol Ada, MD;  Location: [facility];  Service: Endoscopy;  Laterality: N/A;   CHOLECYSTECTOMY     removed during whipple procedure   COLONOSCOPY WITH PROPOFOL N/A 01/19/2014   Procedure: COLONOSCOPY WITH PROPOFOL;  Surgeon: Juanita Craver, MD;  Location: [facility];  Service: Endoscopy;  Laterality: N/A;   ERCP N/A 09/23/2020   Procedure: ENDOSCOPIC RETROGRADE CHOLANGIOPANCREATOGRAPHY (ERCP);  Surgeon: Carol Ada, MD;  Location: [facility];  Service: Endoscopy;  Laterality: N/A;   INGUINAL HERNIA REPAIR N/A 10/02/2021   Procedure: open right inguinal hernia repair with mesh;  Surgeon: Dwan Bolt, MD;  Location: [place];  Service: General;  Laterality: N/A;  LAPAROSCOPY N/A 10/31/2020   Procedure: STAGING LAPAROSCOPY;  Surgeon: Dwan Bolt, MD;  Location: [place];  Service: General;  Laterality: N/A;  GEN AND TAP BLOCK   SPHINCTEROTOMY  09/23/2020   Procedure: Joan Mayans;  Surgeon: Carol Ada, MD;  Location: [facility];  Service: Endoscopy;;   WHIPPLE PROCEDURE N/A 10/31/2020   Procedure: WHIPPLE PROCEDURE, INTRAOPERATIVE ULTRASOUND;  Surgeon: Dwan Bolt, MD;  Location: [place];  Service: General;  Laterality: N/A;   WISDOM TOOTH EXTRACTION     66 years old    I have reviewed the social history and family history with the patient and they are unchanged from previous note.  ALLERGIES:  has No Known Allergies.  MEDICATIONS:  Current  Outpatient Medications  Medication Sig Dispense Refill   aspirin EC 81 MG tablet Take 81 mg by mouth in the morning. Swallow whole.     busPIRone (BUSPAR) 5 MG tablet Take 5 mg by mouth at bedtime.     magic mouthwash (multi-ingredient) oral suspension Swish, gargle, and spit 5-10 mls for 1 minute. Repeat every 6 hours as needed. May swallow if throat involved. 480 mL 1   Multiple Vitamin (MULTIVITAMIN WITH MINERALS) TABS tablet Take 1 tablet by mouth daily.     Pancrelipase, Lip-Prot-Amyl, 4200-14200 units CPEP Take by mouth.     PARoxetine (PAXIL) 30 MG tablet Take 60 mg by mouth at bedtime.     QUEtiapine (SEROQUEL) 50 MG tablet Take 75 mg by mouth at bedtime.     tiaGABine (GABITRIL) 4 MG tablet Take 8 mg by mouth at bedtime.     No current facility-administered medications for this visit.    PHYSICAL EXAMINATION: ECOG PERFORMANCE STATUS: 1 - Symptomatic but completely ambulatory  Vitals:   11/23/21 1416  BP: (!) 155/88  Pulse: (!) 59  Resp: 18  Temp: 98.1 F (36.7 C)  SpO2: 100%   Wt Readings from Last 3 Encounters:  11/23/21 153 lb 9.6 oz (69.7 kg)  10/11/21 145 lb 14.4 oz (66.2 kg)  09/27/21 148 lb 1.6 oz (67.2 kg)     GENERAL:alert, no distress and comfortable SKIN: skin color normal, no rashes or significant lesions EYES: normal, Conjunctiva are pink and non-injected, sclera clear  NEURO: alert & oriented x 3 with fluent speech  LABORATORY DATA:  I have reviewed the data as listed    Latest Ref Rng & Units 11/21/2021    9:10 AM 10/11/2021    2:17 PM 08/09/2021    1:03 PM  CBC  WBC 4.0 - 10.5 K/uL 6.3  5.6  4.8   Hemoglobin 13.0 - 17.0 g/dL 12.3  12.7  12.1   Hematocrit 39.0 - 52.0 % 35.2  35.3  33.4   Platelets 150 - 400 K/uL 183  187  148         Latest Ref Rng & Units 11/21/2021    9:10 AM 10/11/2021    2:17 PM 08/09/2021    1:03 PM  CMP  Glucose 70 - 99 mg/dL 107  88  102   BUN 8 - 23 mg/dL '16  15  12   '$ Creatinine 0.61 - 1.24 mg/dL 0.50  0.63  0.73    Sodium 135 - 145 mmol/L 142  142  141   Potassium 3.5 - 5.1 mmol/L 3.8  3.7  3.8   Chloride 98 - 111 mmol/L 110  111  112   CO2 22 - 32 mmol/L '29  26  27   '$ Calcium 8.9 -  10.3 mg/dL 8.2  8.7  8.5   Total Protein 6.5 - 8.1 g/dL 5.7  6.1  5.9   Total Bilirubin 0.3 - 1.2 mg/dL 0.3  0.5  0.3   Alkaline Phos 38 - 126 U/L 319  222  190   AST 15 - 41 U/L 85  79  81   ALT 0 - 44 U/L 105  90  84       RADIOGRAPHIC STUDIES: I have personally reviewed the radiological images as listed and agreed with the findings in the report. No results found.    Orders Placed This Encounter  Procedures   MR ABDOMEN MRCP W WO CONTAST    Standing Status:   Future    Standing Expiration Date:   01/23/2022    Order Specific Question:   If indicated for the ordered procedure, I authorize the administration of contrast media per Radiology protocol    Answer:   Yes    Order Specific Question:   What is the patient's sedation requirement?    Answer:   No Sedation    Order Specific Question:   Does the patient have a pacemaker or implanted devices?    Answer:   No    Order Specific Question:   Release to patient    Answer:   Immediate    Order Specific Question:   Preferred imaging location?    Answer:   [facility] (table limit - 550 lbs)   Ambulatory referral to Urology    Referral Priority:   Urgent    Referral Type:   Consultation    Referral Reason:   Specialty Services Required    Requested Specialty:   Urology    Number of Visits Requested:   1   All questions were answered. The patient knows to call the clinic with any problems, questions or concerns. No barriers to learning was detected. The total time spent in the appointment was 30 minutes.     [name], MD 11/23/2021   I, Wilburn Mylar, am acting as scribe for [name], MD.   I have reviewed the above documentation for accuracy and completeness, and I agree with the above.

## 2021-11-24 ENCOUNTER — Telehealth: Payer: Self-pay | Admitting: Hematology

## 2021-11-24 ENCOUNTER — Other Ambulatory Visit: Payer: Self-pay

## 2021-11-24 NOTE — Progress Notes (Signed)
Faxed referral to [facility] per Dr. Ernestina Penna request.  Fax confirmation received.

## 2021-11-24 NOTE — Telephone Encounter (Signed)
Left patient a voicemail regarding upcoming appointments  

## 2021-12-05 ENCOUNTER — Other Ambulatory Visit: Payer: Self-pay | Admitting: Hematology

## 2021-12-06 ENCOUNTER — Other Ambulatory Visit: Payer: Self-pay | Admitting: Nurse Practitioner

## 2021-12-06 ENCOUNTER — Encounter: Payer: Self-pay | Admitting: Hematology

## 2021-12-06 ENCOUNTER — Other Ambulatory Visit: Payer: Self-pay

## 2021-12-06 MED ORDER — DIPHENOXYLATE-ATROPINE 2.5-0.025 MG PO TABS: 1.0000 | ORAL_TABLET | Freq: Four times a day (QID) | ORAL | 1 refills | Status: DC | PRN

## 2021-12-20 ENCOUNTER — Other Ambulatory Visit: Payer: Self-pay

## 2021-12-20 DIAGNOSIS — C221 Intrahepatic bile duct carcinoma: Secondary | ICD-10-CM

## 2021-12-25 ENCOUNTER — Other Ambulatory Visit: Payer: Federal, State, Local not specified - PPO

## 2021-12-27 ENCOUNTER — Other Ambulatory Visit: Payer: Self-pay

## 2021-12-27 ENCOUNTER — Inpatient Hospital Stay: Payer: Federal, State, Local not specified - PPO | Attending: Hematology

## 2021-12-27 DIAGNOSIS — R978 Other abnormal tumor markers: Secondary | ICD-10-CM | POA: Insufficient documentation

## 2021-12-27 DIAGNOSIS — C240 Malignant neoplasm of extrahepatic bile duct: Secondary | ICD-10-CM | POA: Diagnosis not present

## 2021-12-27 DIAGNOSIS — F1021 Alcohol dependence, in remission: Secondary | ICD-10-CM | POA: Diagnosis not present

## 2021-12-27 DIAGNOSIS — C221 Intrahepatic bile duct carcinoma: Secondary | ICD-10-CM

## 2021-12-27 DIAGNOSIS — C249 Malignant neoplasm of biliary tract, unspecified: Secondary | ICD-10-CM

## 2021-12-27 LAB — CMP (CANCER CENTER ONLY)
ALT: 111 U/L — ABNORMAL HIGH (ref 0–44)
AST: 111 U/L — ABNORMAL HIGH (ref 15–41)
Albumin: 3.4 g/dL — ABNORMAL LOW (ref 3.5–5.0)
Alkaline Phosphatase: 659 U/L — ABNORMAL HIGH (ref 38–126)
Anion gap: 5 (ref 5–15)
BUN: 16 mg/dL (ref 8–23)
CO2: 28 mmol/L (ref 22–32)
Calcium: 8.3 mg/dL — ABNORMAL LOW (ref 8.9–10.3)
Chloride: 107 mmol/L (ref 98–111)
Creatinine: 0.6 mg/dL — ABNORMAL LOW (ref 0.61–1.24)
GFR, Estimated: >60 mL/min (ref >60)
Glucose, Bld: 97 mg/dL (ref 70–99)
Potassium: 3.9 mmol/L (ref 3.5–5.1)
Sodium: 140 mmol/L (ref 135–145)
Total Bilirubin: 0.6 mg/dL (ref 0.3–1.2)
Total Protein: 6.1 g/dL — ABNORMAL LOW (ref 6.5–8.1)

## 2021-12-27 LAB — CBC WITH DIFFERENTIAL (CANCER CENTER ONLY)
Abs Immature Granulocytes: 0.01 10*3/uL (ref 0.00–0.07)
Basophils Absolute: 0.1 10*3/uL (ref 0.0–0.1)
Basophils Relative: 1 %
Eosinophils Absolute: 0.2 10*3/uL (ref 0.0–0.5)
Eosinophils Relative: 4 %
HCT: 34.1 % — ABNORMAL LOW (ref 39.0–52.0)
Hemoglobin: 11.7 g/dL — ABNORMAL LOW (ref 13.0–17.0)
Immature Granulocytes: 0 %
Lymphocytes Relative: 10 %
Lymphs Abs: 0.5 10*3/uL — ABNORMAL LOW (ref 0.7–4.0)
MCH: 34.3 pg — ABNORMAL HIGH (ref 26.0–34.0)
MCHC: 34.3 g/dL (ref 30.0–36.0)
MCV: 100 fL (ref 80.0–100.0)
Monocytes Absolute: 0.4 10*3/uL (ref 0.1–1.0)
Monocytes Relative: 9 %
Neutro Abs: 3.6 10*3/uL (ref 1.7–7.7)
Neutrophils Relative %: 76 %
Platelet Count: 163 10*3/uL (ref 150–400)
RBC: 3.41 MIL/uL — ABNORMAL LOW (ref 4.22–5.81)
RDW: 13 % (ref 11.5–15.5)
WBC Count: 4.8 10*3/uL (ref 4.0–10.5)
nRBC: 0 % (ref 0.0–0.2)

## 2021-12-28 ENCOUNTER — Encounter: Payer: Self-pay | Admitting: Hematology

## 2021-12-28 LAB — CANCER ANTIGEN 19-9: CA 19-9: 163 U/mL — ABNORMAL HIGH (ref 0–35)

## 2021-12-29 ENCOUNTER — Other Ambulatory Visit: Payer: Self-pay

## 2021-12-29 DIAGNOSIS — C221 Intrahepatic bile duct carcinoma: Secondary | ICD-10-CM

## 2022-01-02 ENCOUNTER — Other Ambulatory Visit: Payer: Self-pay

## 2022-01-02 ENCOUNTER — Emergency Department (HOSPITAL_COMMUNITY)

## 2022-01-02 ENCOUNTER — Telehealth: Payer: Self-pay

## 2022-01-02 ENCOUNTER — Encounter (HOSPITAL_COMMUNITY): Payer: Self-pay

## 2022-01-02 ENCOUNTER — Inpatient Hospital Stay (HOSPITAL_COMMUNITY)
Admission: EM | Admit: 2022-01-02 | Discharge: 2022-01-07 | DRG: 327 | Disposition: A | Discharge status: Home Health | Attending: Surgery | Admitting: Surgery

## 2022-01-02 DIAGNOSIS — K520 Gastroenteritis and colitis due to radiation: Secondary | ICD-10-CM | POA: Diagnosis present

## 2022-01-02 DIAGNOSIS — K631 Perforation of intestine (nontraumatic): Secondary | ICD-10-CM | POA: Diagnosis present

## 2022-01-02 DIAGNOSIS — T451X5A Adverse effect of antineoplastic and immunosuppressive drugs, initial encounter: Secondary | ICD-10-CM | POA: Diagnosis present

## 2022-01-02 DIAGNOSIS — R1084 Generalized abdominal pain: Secondary | ICD-10-CM

## 2022-01-02 DIAGNOSIS — K219 Gastro-esophageal reflux disease without esophagitis: Secondary | ICD-10-CM | POA: Diagnosis present

## 2022-01-02 DIAGNOSIS — Z90411 Acquired partial absence of pancreas: Secondary | ICD-10-CM

## 2022-01-02 DIAGNOSIS — G4733 Obstructive sleep apnea (adult) (pediatric): Secondary | ICD-10-CM | POA: Diagnosis present

## 2022-01-02 DIAGNOSIS — Z79899 Other long term (current) drug therapy: Secondary | ICD-10-CM

## 2022-01-02 DIAGNOSIS — I10 Essential (primary) hypertension: Secondary | ICD-10-CM | POA: Diagnosis present

## 2022-01-02 DIAGNOSIS — K285 Chronic or unspecified gastrojejunal ulcer with perforation: Secondary | ICD-10-CM | POA: Diagnosis not present

## 2022-01-02 DIAGNOSIS — F17210 Nicotine dependence, cigarettes, uncomplicated: Secondary | ICD-10-CM | POA: Diagnosis present

## 2022-01-02 DIAGNOSIS — K521 Toxic gastroenteritis and colitis: Secondary | ICD-10-CM | POA: Diagnosis present

## 2022-01-02 DIAGNOSIS — Z833 Family history of diabetes mellitus: Secondary | ICD-10-CM

## 2022-01-02 DIAGNOSIS — R188 Other ascites: Secondary | ICD-10-CM | POA: Diagnosis present

## 2022-01-02 DIAGNOSIS — F319 Bipolar disorder, unspecified: Secondary | ICD-10-CM | POA: Diagnosis present

## 2022-01-02 DIAGNOSIS — K668 Other specified disorders of peritoneum: Principal | ICD-10-CM

## 2022-01-02 DIAGNOSIS — W881XXA Exposure to radioactive isotopes, initial encounter: Secondary | ICD-10-CM | POA: Diagnosis present

## 2022-01-02 DIAGNOSIS — Z9221 Personal history of antineoplastic chemotherapy: Secondary | ICD-10-CM

## 2022-01-02 DIAGNOSIS — E860 Dehydration: Secondary | ICD-10-CM | POA: Insufficient documentation

## 2022-01-02 DIAGNOSIS — F4310 Post-traumatic stress disorder, unspecified: Secondary | ICD-10-CM | POA: Diagnosis present

## 2022-01-02 DIAGNOSIS — Z8509 Personal history of malignant neoplasm of other digestive organs: Secondary | ICD-10-CM

## 2022-01-02 DIAGNOSIS — Z923 Personal history of irradiation: Secondary | ICD-10-CM

## 2022-01-02 DIAGNOSIS — Z8249 Family history of ischemic heart disease and other diseases of the circulatory system: Secondary | ICD-10-CM

## 2022-01-02 DIAGNOSIS — J449 Chronic obstructive pulmonary disease, unspecified: Secondary | ICD-10-CM | POA: Diagnosis present

## 2022-01-02 DIAGNOSIS — Z818 Family history of other mental and behavioral disorders: Secondary | ICD-10-CM

## 2022-01-02 DIAGNOSIS — Z8505 Personal history of malignant neoplasm of liver: Secondary | ICD-10-CM

## 2022-01-02 DIAGNOSIS — E119 Type 2 diabetes mellitus without complications: Secondary | ICD-10-CM | POA: Diagnosis present

## 2022-01-02 DIAGNOSIS — K660 Peritoneal adhesions (postprocedural) (postinfection): Secondary | ICD-10-CM | POA: Diagnosis present

## 2022-01-02 DIAGNOSIS — Z7982 Long term (current) use of aspirin: Secondary | ICD-10-CM

## 2022-01-02 HISTORY — DX: Unspecified right bundle-branch block: I45.10

## 2022-01-02 LAB — COMPREHENSIVE METABOLIC PANEL
ALT: 119 U/L — ABNORMAL HIGH (ref 0–44)
AST: 99 U/L — ABNORMAL HIGH (ref 15–41)
Albumin: 3.4 g/dL — ABNORMAL LOW (ref 3.5–5.0)
Alkaline Phosphatase: 725 U/L — ABNORMAL HIGH (ref 38–126)
Anion gap: 6 (ref 5–15)
BUN: 14 mg/dL (ref 8–23)
CO2: 29 mmol/L (ref 22–32)
Calcium: 8.4 mg/dL — ABNORMAL LOW (ref 8.9–10.3)
Chloride: 102 mmol/L (ref 98–111)
Creatinine, Ser: 0.51 mg/dL — ABNORMAL LOW (ref 0.61–1.24)
GFR, Estimated: >60 mL/min (ref >60)
Glucose, Bld: 91 mg/dL (ref 70–99)
Potassium: 3.3 mmol/L — ABNORMAL LOW (ref 3.5–5.1)
Sodium: 137 mmol/L (ref 135–145)
Total Bilirubin: 0.8 mg/dL (ref 0.3–1.2)
Total Protein: 6.4 g/dL — ABNORMAL LOW (ref 6.5–8.1)

## 2022-01-02 LAB — CBC WITH DIFFERENTIAL/PLATELET
Abs Immature Granulocytes: 0.02 10*3/uL (ref 0.00–0.07)
Basophils Absolute: 0 10*3/uL (ref 0.0–0.1)
Basophils Relative: 0 %
Eosinophils Absolute: 0 10*3/uL (ref 0.0–0.5)
Eosinophils Relative: 1 %
HCT: 40.6 % (ref 39.0–52.0)
Hemoglobin: 13.5 g/dL (ref 13.0–17.0)
Immature Granulocytes: 0 %
Lymphocytes Relative: 4 %
Lymphs Abs: 0.2 10*3/uL — ABNORMAL LOW (ref 0.7–4.0)
MCH: 33.8 pg (ref 26.0–34.0)
MCHC: 33.3 g/dL (ref 30.0–36.0)
MCV: 101.5 fL — ABNORMAL HIGH (ref 80.0–100.0)
Monocytes Absolute: 0.2 10*3/uL (ref 0.1–1.0)
Monocytes Relative: 3 %
Neutro Abs: 6.4 10*3/uL (ref 1.7–7.7)
Neutrophils Relative %: 92 %
Platelets: 187 10*3/uL (ref 150–400)
RBC: 4 MIL/uL — ABNORMAL LOW (ref 4.22–5.81)
RDW: 13.2 % (ref 11.5–15.5)
WBC: 6.9 10*3/uL (ref 4.0–10.5)
nRBC: 0 % (ref 0.0–0.2)

## 2022-01-02 LAB — URINALYSIS, ROUTINE W REFLEX MICROSCOPIC
Bilirubin Urine: NEGATIVE
Glucose, UA: NEGATIVE mg/dL
Hgb urine dipstick: NEGATIVE
Ketones, ur: NEGATIVE mg/dL
Leukocytes,Ua: NEGATIVE
Nitrite: NEGATIVE
Protein, ur: NEGATIVE mg/dL
Specific Gravity, Urine: 1.018 (ref 1.005–1.030)
pH: 7 (ref 5.0–8.0)

## 2022-01-02 LAB — LIPASE, BLOOD: Lipase: 22 U/L (ref 11–51)

## 2022-01-02 LAB — TROPONIN I (HIGH SENSITIVITY): Troponin I (High Sensitivity): 8 ng/L (ref <18)

## 2022-01-02 MED ORDER — LACTATED RINGERS IV SOLN: INTRAVENOUS | Status: DC

## 2022-01-02 MED ORDER — IOHEXOL 350 MG/ML SOLN
100.0000 mL | Freq: Once | INTRAVENOUS | Status: AC | PRN
  Administered 2022-01-02: 100 mL via INTRAVENOUS

## 2022-01-02 MED ORDER — ONDANSETRON HCL 4 MG/2ML IJ SOLN
INTRAMUSCULAR | Status: AC
  Filled 2022-01-02: qty 2

## 2022-01-02 MED ORDER — ONDANSETRON HCL 4 MG/2ML IJ SOLN
4.0000 mg | Freq: Once | INTRAMUSCULAR | Status: AC
  Administered 2022-01-02: 4 mg via INTRAVENOUS
  Filled 2022-01-02: qty 2

## 2022-01-02 MED ORDER — FENTANYL CITRATE (PF) 100 MCG/2ML IJ SOLN
INTRAMUSCULAR | Status: AC
  Filled 2022-01-02: qty 2

## 2022-01-02 MED ORDER — PIPERACILLIN-TAZOBACTAM 3.375 G IVPB 30 MIN
3.3750 g | Freq: Once | INTRAVENOUS | Status: AC
  Administered 2022-01-02: 3.375 g via INTRAVENOUS
  Filled 2022-01-02: qty 50

## 2022-01-02 MED ORDER — DEXAMETHASONE SODIUM PHOSPHATE 10 MG/ML IJ SOLN
INTRAMUSCULAR | Status: AC
  Filled 2022-01-02: qty 1

## 2022-01-02 MED ORDER — SODIUM CHLORIDE 0.9 % IV BOLUS
500.0000 mL | Freq: Once | INTRAVENOUS | Status: AC
  Administered 2022-01-02: 500 mL via INTRAVENOUS

## 2022-01-02 MED ORDER — HYDROMORPHONE HCL 1 MG/ML IJ SOLN
1.0000 mg | INTRAMUSCULAR | Status: DC | PRN
  Administered 2022-01-02 – 2022-01-04 (×3): 1 mg via INTRAVENOUS
  Filled 2022-01-02 (×3): qty 1

## 2022-01-02 MED ORDER — PANCRELIPASE (LIP-PROT-AMYL) 4200-14200 UNITS PO CPEP
4200.0000 [IU] | ORAL_CAPSULE | Freq: Three times a day (TID) | ORAL | 1 refills | Status: AC
Start: 2022-01-02 — End: ?

## 2022-01-02 MED ORDER — HYDROMORPHONE HCL 1 MG/ML IJ SOLN
1.0000 mg | Freq: Once | INTRAMUSCULAR | Status: AC
  Administered 2022-01-02: 1 mg via INTRAVENOUS
  Filled 2022-01-02: qty 1

## 2022-01-02 NOTE — Anesthesia Preprocedure Evaluation (Signed)
Anesthesia Evaluation  Patient identified by MRN, date of birth, ID band Patient awake    Reviewed: Allergy & Precautions, NPO status , Patient's Chart, lab work & pertinent test results  History of Anesthesia Complications Negative for: history of anesthetic complications  Airway Mallampati: III  TM Distance: >3 FB Neck ROM: Full    Dental  (+)    Pulmonary neg shortness of breath, sleep apnea and Continuous Positive Airway Pressure Ventilation , COPD, neg recent URI, Current Smoker Interstitial pulmonary fibrosis   Pulmonary exam normal breath sounds clear to auscultation       Cardiovascular hypertension, (-) angina (-) Past MI, (-) Cardiac Stents and (-) CABG + dysrhythmias (RBBB)  Rhythm:Regular Rate:Normal  Bilateral 1-39% carotid stenosis   Neuro/Psych  PSYCHIATRIC DISORDERS (PTSD) Anxiety Depression Bipolar Disorder   H/o alcohol abuse   GI/Hepatic Neg liver ROS,GERD  ,,Cholangiocarcinoma s/p Whipple and chemo/XRT   Endo/Other  diabetes, Type 2    Renal/GU negative Renal ROS     Musculoskeletal   Abdominal  (+) - obese  Peds  Hematology negative hematology ROS (+)   Anesthesia Other Findings   Reproductive/Obstetrics                             Anesthesia Physical Anesthesia Plan  ASA: 3 and emergent  Anesthesia Plan: General   Post-op Pain Management:    Induction: Intravenous and Rapid sequence  PONV Risk Score and Plan: 1 and Ondansetron and Dexamethasone  Airway Management Planned: Oral ETT  Additional Equipment:   Intra-op Plan:   Post-operative Plan: Extubation in OR  Informed Consent: I have reviewed the patients History and Physical, chart, labs and discussed the procedure including the risks, benefits and alternatives for the proposed anesthesia with the patient or authorized representative who has indicated his/her understanding and acceptance.     Dental  advisory given  Plan Discussed with: CRNA and Anesthesiologist  Anesthesia Plan Comments: (Risks of general anesthesia discussed including, but not limited to, sore throat, hoarse voice, chipped/damaged teeth, injury to vocal cords, nausea and vomiting, allergic reactions, lung infection, heart attack, stroke, and death. All questions answered. )       Anesthesia Quick Evaluation

## 2022-01-02 NOTE — ED Notes (Signed)
OR staff here to transport pt to the OR at this time

## 2022-01-02 NOTE — H&P (Signed)
[name] [name] is an 66 y.o. male.   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 Michaelle Birks.  Subsequently he completed Xeloda and radiation.  He also had a right inguinal hernia repair with mesh 10/02/21 by Dr. Zenia Resides.  He presents now with acute onset of pain in the lower abdomen that then radiated across his entire abdomen.  He has had chronic diarrhea since radiation and continues to have bowel movements.  CT scan showed free air and free fluid in the LUQ and pelvis.    Past Medical History:  Diagnosis Date   Alcoholism [facility])    pt has not drank since 11/2019   Anxiety    Bilateral carotid artery stenosis    Bipolar disorder ([place])    Cancer ([place]) 2022   bile duct   COPD (chronic obstructive pulmonary disease) ([place])    Depression    " post traumatic stress disorder" sees MD in [place] every 3-6 months.   Diabetes mellitus without complication ([place])    type 2   Dysrhythmia    hx. Bundle branch block- saw Dr. Kathlee Nations   GERD (gastroesophageal reflux disease)    Hypertension    Irregular heart rate    RBBB    Sleep apnea    cpap used with nose clip- Dr. Baird Lyons follows   Substance abuse [facility])    "tends to self medicate(Rx. meds or Alcohol) trying to get relief from "PSTD"    Past Surgical History:  Procedure Laterality Date   BILIARY BRUSHING  09/23/2020   Procedure: BILIARY BRUSHING;  Surgeon: Carol Ada, MD;  Location: [facility];  Service: Endoscopy;;   BILIARY STENT PLACEMENT N/A 09/23/2020   Procedure: BILIARY STENT PLACEMENT;  Surgeon: Carol Ada, MD;  Location: [facility];  Service: Endoscopy;  Laterality: N/A;   CHOLECYSTECTOMY     removed during whipple procedure   COLONOSCOPY WITH PROPOFOL N/A 01/19/2014   Procedure: COLONOSCOPY WITH PROPOFOL;  Surgeon: Juanita Craver, MD;  Location: [facility];  Service: Endoscopy;  Laterality: N/A;   ERCP N/A  09/23/2020   Procedure: ENDOSCOPIC RETROGRADE CHOLANGIOPANCREATOGRAPHY (ERCP);  Surgeon: Carol Ada, MD;  Location: [facility];  Service: Endoscopy;  Laterality: N/A;   INGUINAL HERNIA REPAIR N/A 10/02/2021   Procedure: open right inguinal hernia repair with mesh;  Surgeon: Dwan Bolt, MD;  Location: [place];  Service: General;  Laterality: N/A;   LAPAROSCOPY N/A 10/31/2020   Procedure: STAGING LAPAROSCOPY;  Surgeon: Dwan Bolt, MD;  Location: [place];  Service: General;  Laterality: N/A;  GEN AND TAP BLOCK   SPHINCTEROTOMY  09/23/2020   Procedure: Joan Mayans;  Surgeon: Carol Ada, MD;  Location: [facility];  Service: Endoscopy;;   WHIPPLE PROCEDURE N/A 10/31/2020   Procedure: WHIPPLE PROCEDURE, INTRAOPERATIVE ULTRASOUND;  Surgeon: Dwan Bolt, MD;  Location: [place];  Service: General;  Laterality: N/A;   WISDOM TOOTH EXTRACTION     66 years old    Family History  Problem Relation Age of Onset   Diabetes Father    Heart Problems Father    Heart disease Father    Mental illness Father    Social History:  reports that he has been smoking cigarettes. He started smoking about 4 years ago. He has a 45.00 pack-year smoking history. He has never used smokeless tobacco. He reports that he does not currently use alcohol. He reports that he does  not use drugs.  Allergies: No Known Allergies  Prior to Admission medications   Medication Sig Start Date End Date Taking? Authorizing Provider  aspirin EC 81 MG tablet Take 81 mg by mouth in the morning. Swallow whole.    [provider]  busPIRone (BUSPAR) 5 MG tablet Take 5 mg by mouth at bedtime. 07/01/20   [provider]  diphenoxylate-atropine (LOMOTIL) 2.5-0.025 MG tablet Take 1-2 tablets by mouth 4 (four) times daily as needed for diarrhea or loose stools. 12/06/21   Alla Feeling, NP  magic mouthwash (multi-ingredient) oral suspension Swish, gargle, and spit 5-10 mls for 1 minute. Repeat every 6 hours as  needed. May swallow if throat involved. 01/03/21   [name], MD  Multiple Vitamin (MULTIVITAMIN WITH MINERALS) TABS tablet Take 1 tablet by mouth daily.    [provider]  Pancrelipase, Lip-Prot-Amyl, 4200-14200 units CPEP Take 4,200 units of lipase by mouth 3 times daily with meals, bedtime and 2 AM. Take 2 pills by mouth with Breakfast, Lunch, and Diner (large meals).  Take 1 pill by mouth with snacks (2 snacks/day) 01/02/22   [name], MD  PARoxetine (PAXIL) 30 MG tablet Take 60 mg by mouth at bedtime.    [provider]  QUEtiapine (SEROQUEL) 50 MG tablet Take 75 mg by mouth at bedtime. 07/07/20   [provider]  tiaGABine (GABITRIL) 4 MG tablet Take 8 mg by mouth at bedtime.    [provider]      Results for orders placed or performed during the hospital encounter of 01/02/22 (from the past 48 hour(s))  Comprehensive metabolic panel     Status: Abnormal   Collection Time: 01/02/22  8:58 PM  Result Value Ref Range   Sodium 137 135 - 145 mmol/L   Potassium 3.3 (L) 3.5 - 5.1 mmol/L   Chloride 102 98 - 111 mmol/L   CO2 29 22 - 32 mmol/L   Glucose, Bld 91 70 - 99 mg/dL    Comment: Glucose reference range applies only to samples taken after fasting for at least 8 hours.   BUN 14 8 - 23 mg/dL   Creatinine, Ser 0.51 (L) 0.61 - 1.24 mg/dL   Calcium 8.4 (L) 8.9 - 10.3 mg/dL   Total Protein 6.4 (L) 6.5 - 8.1 g/dL   Albumin 3.4 (L) 3.5 - 5.0 g/dL   AST 99 (H) 15 - 41 U/L   ALT 119 (H) 0 - 44 U/L   Alkaline Phosphatase 725 (H) 38 - 126 U/L   Total Bilirubin 0.8 0.3 - 1.2 mg/dL   GFR, Estimated >60 >60 mL/min    Comment: (NOTE) Calculated using the CKD-EPI Creatinine Equation (2021)    Anion gap 6 5 - 15    Comment: Performed at [facility], [place] [address], [place], [place] [address]  Lipase, blood     Status: None   Collection Time: 01/02/22  8:58 PM  Result Value Ref Range   Lipase 22 11 - 51 U/L    Comment: Performed at  [facility], [place] [address] [place],  [address]  CBC with Differential     Status: Abnormal   Collection Time: 01/02/22  8:58 PM  Result Value Ref Range   WBC 6.9 4.0 - 10.5 K/uL   RBC 4.00 (L) 4.22 - 5.81 MIL/uL   Hemoglobin 13.5 13.0 - 17.0 g/dL   HCT 40.6 39.0 - 52.0 %   MCV 101.5 (H) 80.0 - 100.0  fL   MCH 33.8 26.0 - 34.0 pg   MCHC 33.3 30.0 - 36.0 g/dL   RDW 13.2 11.5 - 15.5 %   Platelets 187 150 - 400 K/uL   nRBC 0.0 0.0 - 0.2 %   Neutrophils Relative % 92 %   Neutro Abs 6.4 1.7 - 7.7 K/uL   Lymphocytes Relative 4 %   Lymphs Abs 0.2 (L) 0.7 - 4.0 K/uL   Monocytes Relative 3 %   Monocytes Absolute 0.2 0.1 - 1.0 K/uL   Eosinophils Relative 1 %   Eosinophils Absolute 0.0 0.0 - 0.5 K/uL   Basophils Relative 0 %   Basophils Absolute 0.0 0.0 - 0.1 K/uL   Immature Granulocytes 0 %   Abs Immature Granulocytes 0.02 0.00 - 0.07 K/uL    Comment: Performed at [facility], [place] [address], [place], [place] [address]  Troponin I (High Sensitivity)     Status: None   Collection Time: 01/02/22  8:58 PM  Result Value Ref Range   Troponin I (High Sensitivity) 8 <18 ng/L    Comment: (NOTE) Elevated high sensitivity troponin I (hsTnI) values and significant  changes across serial measurements may suggest ACS but many other  chronic and acute conditions are known to elevate hsTnI results.  Refer to the "Links" section for chest pain algorithms and additional  guidance. Performed at [facility], [place] [address], [place], [place] [address]   Urinalysis, Routine w reflex microscopic     Status: None   Collection Time: 01/02/22 11:00 PM  Result Value Ref Range   Color, Urine YELLOW YELLOW   APPearance CLEAR CLEAR   Specific Gravity, Urine 1.018 1.005 - 1.030   pH 7.0 5.0 - 8.0   Glucose, UA NEGATIVE NEGATIVE mg/dL   Hgb urine dipstick NEGATIVE NEGATIVE   Bilirubin Urine NEGATIVE NEGATIVE   Ketones, ur NEGATIVE NEGATIVE  mg/dL   Protein, ur NEGATIVE NEGATIVE mg/dL   Nitrite NEGATIVE NEGATIVE   Leukocytes,Ua NEGATIVE NEGATIVE    Comment: Performed at [facility] [address] [place], [place] [address]   CT Angio Chest/Abd/Pel for Dissection W and/or Wo Contrast  Result Date: 01/02/2022 CLINICAL DATA:  History of cholangiocarcinoma status post Whipple procedure, sudden onset lower abdominal pain radiating to upper abdomen, bilateral shoulder pain, near syncope EXAM: CT ANGIOGRAPHY CHEST, ABDOMEN AND PELVIS TECHNIQUE: Non-contrast CT of the chest was initially obtained. Multidetector CT imaging through the chest, abdomen and pelvis was performed using the standard protocol during bolus administration of intravenous contrast. Multiplanar reconstructed images and MIPs were obtained and reviewed to evaluate the vascular anatomy. RADIATION DOSE REDUCTION: This exam was performed according to the departmental dose-optimization program which includes automated exposure control, adjustment of the mA and/or kV according to patient size and/or use of iterative reconstruction technique. CONTRAST:  158m OMNIPAQUE IOHEXOL 350 MG/ML SOLN COMPARISON:  11/21/2021, 08/07/2021 FINDINGS: CTA CHEST FINDINGS Cardiovascular: No evidence of thoracic aortic aneurysm or dissection. Stable atherosclerosis of the aortic arch. The heart is unremarkable without pericardial effusion. No filling defects or pulmonary emboli. Mediastinum/Nodes: No enlarged mediastinal, hilar, or axillary lymph nodes. Thyroid gland, trachea, and esophagus demonstrate no significant findings. Lungs/Pleura: Stable emphysema and bibasilar fibrosis. No acute airspace disease, effusion, or pneumothorax. Central airways are patent. Musculoskeletal: No acute or destructive bony lesions. Reconstructed images demonstrate no additional findings. Review of the MIP images confirms the above findings. CTA ABDOMEN AND PELVIS FINDINGS VASCULAR Aorta: Normal caliber  aorta without aneurysm, dissection,  vasculitis or significant stenosis. Diffuse atherosclerosis. Celiac: Patent without evidence of aneurysm, dissection, vasculitis or significant stenosis. SMA: Patent without evidence of aneurysm, dissection, vasculitis or significant stenosis. Anatomic variant with replaced right hepatic artery off the SMA. Renals: Both renal arteries are patent without evidence of aneurysm, dissection, vasculitis, fibromuscular dysplasia or significant stenosis. Mild atherosclerosis at the origin of the right renal artery. IMA: Patent without evidence of aneurysm, dissection, vasculitis or significant stenosis. Inflow: Patent without evidence of aneurysm, dissection, vasculitis or significant stenosis. Diffuse atherosclerosis. Veins: No obvious venous abnormality within the limitations of this arterial phase study. Review of the MIP images confirms the above findings. NON-VASCULAR Hepatobiliary: Stable pneumobilia related to prior cholecystectomy and choledocho jejunostomy. The liver is grossly unremarkable with no focal abnormality on this arterial phase exam. Pancreas: Prior resection of the pancreatic head. Remainder of the pancreatic tissue is somewhat atrophic. No inflammatory changes or pancreatic duct dilation. Spleen: Normal in size without focal abnormality. Adrenals/Urinary Tract: Stable obstructing 5 mm proximal right ureteral calculus, with mild right-sided hydronephrosis again noted. There is a 3 mm nonobstructing left renal calculus. No left-sided obstruction. The adrenals and bladder are unremarkable. Stomach/Bowel: Evaluation of the bowel is limited without oral contrast. Postsurgical changes are seen from prior Whipple procedure, with distal gastrectomy and duodenectomy. No evidence of bowel obstruction or ileus. Jejunal wall thickening is seen within the right mid abdomen, reference image 141/6. There is moderate gaseous distention of the stomach, with mural thickening of the  gastric antrum. Free gas within the upper abdomen is consistent with perforated viscus or anastomotic breakdown at site of prior Whipple procedure. Surgical consultation is recommended. Lymphatic: No pathologic adenopathy within the abdomen or pelvis. Reproductive: Prostate is unremarkable. Other: Free fluid is seen primarily within the left upper quadrant and lower pelvis. Pneumoperitoneum within the upper abdomen as above. No abdominal wall hernia. Musculoskeletal: No acute displaced fractures. Bilateral femoral head avascular necrosis again noted. Reconstructed images demonstrate no additional findings. Review of the MIP images confirms the above findings. IMPRESSION: 1. Pneumoperitoneum, consistent with perforated viscus or anastomotic breakdown at site of prior Whipple procedure. The exact source of the free gas is not visualized on CT, though wall thickening of the gastric antrum and right upper quadrant small bowel is noted and could reflect site of perforation. Surgical consultation recommended. 2. No evidence of thoracoabdominal aortic aneurysm or dissection. 3. Free fluid within the left upper quadrant and lower pelvis. 4. Stable obstructing 5 mm proximal right ureteral calculus with mild right-sided hydronephrosis. 5. Nonobstructing 3 mm left renal calculus. 6. Aortic Atherosclerosis (ICD10-I70.0) and Emphysema (ICD10-J43.9). Critical Value/emergent results were called by telephone at the time of interpretation on 01/02/2022 at 10:29 pm to provider JOSHUA LONG , who verbally acknowledged these results. Electronically Signed   By: Randa Ngo M.D.   On: 01/02/2022 22:29    Review of Systems  Constitutional:  Positive for activity change and fatigue.  HENT:  Negative for ear discharge, ear pain, hearing loss and tinnitus.   Eyes:  Negative for photophobia and pain.  Respiratory:  Negative for cough and shortness of breath.   Cardiovascular:  Negative for chest pain.  Gastrointestinal:  Positive  for abdominal pain and diarrhea. Negative for nausea and vomiting.  Genitourinary:  Positive for urgency. Negative for dysuria, flank pain and frequency.  Musculoskeletal:  Negative for back pain, myalgias and neck pain.  Neurological:  Positive for weakness. Negative for dizziness and headaches.  Hematological:  Does not bruise/bleed easily.  Psychiatric/Behavioral:  The patient is not nervous/anxious.     Blood pressure (!) 166/89, pulse 83, temperature 98.4 F (36.9 C), temperature source Oral, resp. rate 18, height '5\' 10"'$  (1.778 m), weight 64.4 kg, SpO2 95 %. Physical Exam  Constitutional:  WDWN in NAD, conversant, no obvious deformities; lying in bed; appears uncomfortable Eyes:  Pupils equal, round; sclera anicteric; moist conjunctiva; no lid lag HENT:  Oral mucosa moist; good dentition  Neck:  No masses palpated, trachea midline; no thyromegaly Lungs:  CTA bilaterally; normal respiratory effort CV:  Regular rate and rhythm; no murmurs; extremities well-perfused with no edema Abd:  generalized tenderness; distended; rigid; guarding; healed upper midline incision Musc:  Unable to assess gait; no apparent clubbing or cyanosis in extremities Lymphatic:  No palpable cervical or axillary lymphadenopathy Skin:  Warm, dry; no sign of jaundice Psychiatric - alert and oriented x 4; calm mood and affect  Assessment/Plan Pneumoperitoneum - perforated viscus History of cholangiocarcinoma s/p Whipple  ACS RISK CALCULATOR USE:  Risk Calculator was used for discussion of surgery: Yes      Maia Petties, MD 01/02/2022, 11:38 PM

## 2022-01-02 NOTE — ED Notes (Signed)
Pt to Ct scanner at this time

## 2022-01-02 NOTE — ED Provider Notes (Signed)
[facility]-EMERGENCY DEPT Provider Note   CSN: [id number] Arrival date & time: 01/02/22  1928     History  Chief Complaint  Patient presents with   Near Syncope    [name] [name] is a 66 y.o. male.   Near Syncope Associated symptoms include abdominal pain. Pertinent negatives include no chest pain and no shortness of breath.   Abdominal pain that started around 6:30 pm today. He recently had hernia surgery and he thought it was that. Pain started in groin, but then spread/radiated throughout his whole abdomen. Pain was constant, sharp, and 10/10. Patient reports he always has a tender belly after his whipple surgery. Patient noted that pain was so severe he thought it would make him pass out, he felt dizzy and laid down in the floor. Reports he's had loose stools for months, since starting radiation therapy for his cancer. Reports 5 or more loose stools in a day, with orange greese, whcich doctors daid is from undigested fats. Reports he often has belly pain from gas, but has relief after it passes, but this pain felt different.   Wife reports that his cancer markers have doubled and he's scheduled for a PET scan at the end of the month.   Denies any CP or SOB, or fever/myalgias, but reports he's been unusually weak for last 2 days.      Home Medications Prior to Admission medications   Medication Sig Start Date End Date Taking? Authorizing Provider  aspirin EC 81 MG tablet Take 81 mg by mouth in the morning. Swallow whole.    [provider]  busPIRone (BUSPAR) 5 MG tablet Take 5 mg by mouth at bedtime. 07/01/20   [provider]  diphenoxylate-atropine (LOMOTIL) 2.5-0.025 MG tablet Take 1-2 tablets by mouth 4 (four) times daily as needed for diarrhea or loose stools. 12/06/21   Burton, Lacie K, NP  magic mouthwash (multi-ingredient) oral suspension Swish, gargle, and spit 5-10 mls for 1 minute. Repeat every 6 hours as needed. May swallow if  throat involved. 01/03/21   Feng, Yan, MD  Multiple Vitamin (MULTIVITAMIN WITH MINERALS) TABS tablet Take 1 tablet by mouth daily.    [provider]  Pancrelipase, Lip-Prot-Amyl, 4200-14200 units CPEP Take 4,200 units of lipase by mouth 3 times daily with meals, bedtime and 2 AM. Take 2 pills by mouth with Breakfast, Lunch, and Diner (large meals).  Take 1 pill by mouth with snacks (2 snacks/day) 01/02/22   Feng, Yan, MD  PARoxetine (PAXIL) 30 MG tablet Take 60 mg by mouth at bedtime.    [provider]  QUEtiapine (SEROQUEL) 50 MG tablet Take 75 mg by mouth at bedtime. 07/07/20   [provider]  tiaGABine (GABITRIL) 4 MG tablet Take 8 mg by mouth at bedtime.    [provider]      Allergies    Patient has no known allergies.    Review of Systems   Review of Systems  Constitutional:  Positive for activity change and fatigue. Negative for fever.  Respiratory:  Negative for shortness of breath.   Cardiovascular:  Positive for near-syncope. Negative for chest pain.  Gastrointestinal:  Positive for abdominal pain and diarrhea. Negative for abdominal distention, nausea and vomiting.  Genitourinary:  Positive for urgency. Negative for penile pain and testicular pain.  Musculoskeletal:  Negative for myalgias.  Neurological:  Positive for weakness.    Physical Exam Updated Vital Signs BP (!) 159/83 (BP Location: Right Arm)   Pulse   62   Temp 98.4 F (36.9 C) (Oral)   Resp 17   Ht 5' 10" (1.778 m)   Wt 64.4 kg   SpO2 100%   BMI 20.37 kg/m  Physical Exam Constitutional:      General: He is not in acute distress.    Appearance: He is ill-appearing.  Cardiovascular:     Rate and Rhythm: Normal rate and regular rhythm.     Pulses: Normal pulses.     Heart sounds: Normal heart sounds. No murmur heard.    No friction rub. No gallop.  Pulmonary:     Effort: Pulmonary effort is normal. No respiratory distress.     Breath sounds: Normal breath sounds.  No stridor. No wheezing or rales.  Abdominal:     General: Abdomen is flat. There is no distension.     Palpations: Abdomen is soft. There is no mass.     Tenderness: There is abdominal tenderness. There is no guarding or rebound.     Hernia: No hernia is present.  Genitourinary:    Penis: Normal.      Testes: Normal.  Skin:    Capillary Refill: Capillary refill takes less than 2 seconds.  Neurological:     Mental Status: He is alert.     ED Results / Procedures / Treatments   Labs (all labs ordered are listed, but only abnormal results are displayed) Labs Reviewed - No data to display  EKG None  Radiology No results found.  Procedures Procedures    Medications Ordered in ED Medications - No data to display  ED Course/ Medical Decision Making/ A&P                           Medical Decision Making This patient presents to the ED for concern of abdominal pain, this involves an extensive number of treatment options, and is a complaint that carries with it a high risk of complications and morbidity.  Ddx considered cholecystitis, pancreatitis, appendicitis, gastritis, incarcerated hernia, SBO   Co morbidities that complicate the patient evaluation       DM, GERD, HTN   Additional history obtained: Through chart review of previous oncology records   Lab Tests:  I Ordered, and personally interpreted labs.  The pertinent results include:  Alk Phos 725, AST 99 ALT 119    Imaging Studies ordered:  I ordered imaging studies including CT Angio Chest/Abd/Pelv which showed free air in the abdomen I agree with the radiologist interpretation   Cardiac Monitoring/ECG:       The patient was maintained on a cardiac monitor.  I personally viewed and interpreted the cardiac monitored which showed Sinus arhythmia with RBB similar to prior ecg   Medicines ordered and prescription drug management:  I ordered medication including Medications - Zofran for nausea, Dilaudid  for pain, Zosyn for intraabdominal free air Reevaluation of the patient after these medicines showed that the patient stayed the same I have reviewed the patients home medicines and have made adjustments as needed   Consultations Obtained:  I requested consultation with the General Surgery  and discussed lab and imaging findings as well as pertinent plan - they recommend: Admission   Problem List / ED Course:       Intraabdominal Free Air   Disposition:  After consideration of the diagnostic results and the patients response to treatment, I feel that the patent would benefit from admission. After consulting with General Surgery, patient  to be admitted and evaluated for possible surgery.     Amount and/or Complexity of Data Reviewed Independent Historian: spouse    Details: Wife of patient gave detailed medical history External Data Reviewed: notes.    Details: Oncology notes Labs: ordered.    Details: CMP, CBC, Trop, Lipase, UA. Elevated LFT's and Alk Phos Radiology: ordered.    Details: CT Angio Chest/Abd/Pelv WWO - Free intraabdominal air ECG/medicine tests: ordered.    Details: Showed Sinus arhythmia w/ RBB  Risk Prescription drug management. Decision regarding hospitalization.           Final Clinical Impression(s) / ED Diagnoses Final diagnoses:  None    Rx / DC Orders ED Discharge Orders     None         , , MD 01/02/22 2256    Long, Joshua G, MD 01/05/22 0426  

## 2022-01-02 NOTE — ED Notes (Signed)
Informed by surgron that pt is going to OR at this time for perforated viscus, Dr. Donnie Mesa has spoken with pt and advised of risk and benefits, paper consent signed by pt at this time

## 2022-01-02 NOTE — Telephone Encounter (Signed)
Pt's wife called stating that the pt is having no control of his BM.  Pt's wife stated that the pt is incontinent and wears diapers.  Pt's wife stated the pt has constant rectal leakage and is having 4 to 5 stools per day.  Per wife, pt is not digesting his food and his stools are fatty and orange in color.  Pt is sleeping more and very fatigue but eating well.  Pt is taking the lomotil as prescribed but is not helping with decreasing the stools.  Pt is hydrating per pt's wife.  Pt drinking Gatorade and Protein around 48 oz per day.  Pt confirmed that he has sensation in his pelvic and lower extremities.  Pt denied back pain but stated he has gas pain.  Spoke with Dr. Burr Medico regarding pt's complaints.  Dr. Burr Medico instructed pt to increase the Pancrelipase to 2 pills with eat large meal (new prescription sent to pt's preferred pharmacy), to start drinking Liquid IV, and to see if the pt can come in this week for labs & IV fluids in [facility] for further assessment.  Informed pt and spouse of Dr. Ernestina Penna recommendations and instructions.  Pt and wife are in agreement with instructions.  Will follow-up with pt once this RN checks with [facility] to see if the pt can be seen this week.

## 2022-01-02 NOTE — ED Triage Notes (Signed)
Pt from home BIB [facility], c/o sudden onset abd pain in lower abd radiating upwards to upper abd. Bilateral shoulder pain briefly, felt near syncope, so he laid on the ground, no LOC, A&O x4, feels "tired" per EMS. Denies nausea, CP, or SOB. Hx of bile duct cancer, treatment finished in March w/chemo and radiation.

## 2022-01-03 ENCOUNTER — Other Ambulatory Visit: Payer: Self-pay

## 2022-01-03 ENCOUNTER — Encounter (HOSPITAL_COMMUNITY): Payer: Self-pay

## 2022-01-03 ENCOUNTER — Emergency Department (HOSPITAL_COMMUNITY): Admitting: Anesthesiology

## 2022-01-03 ENCOUNTER — Encounter (HOSPITAL_COMMUNITY): Admission: EM | Disposition: A | Discharge status: Home Health | Payer: Self-pay | Source: Home / Self Care

## 2022-01-03 DIAGNOSIS — K660 Peritoneal adhesions (postprocedural) (postinfection): Secondary | ICD-10-CM | POA: Diagnosis present

## 2022-01-03 DIAGNOSIS — Z8505 Personal history of malignant neoplasm of liver: Secondary | ICD-10-CM | POA: Diagnosis not present

## 2022-01-03 DIAGNOSIS — G4733 Obstructive sleep apnea (adult) (pediatric): Secondary | ICD-10-CM | POA: Diagnosis present

## 2022-01-03 DIAGNOSIS — F4310 Post-traumatic stress disorder, unspecified: Secondary | ICD-10-CM | POA: Diagnosis present

## 2022-01-03 DIAGNOSIS — Z8249 Family history of ischemic heart disease and other diseases of the circulatory system: Secondary | ICD-10-CM | POA: Diagnosis not present

## 2022-01-03 DIAGNOSIS — K631 Perforation of intestine (nontraumatic): Secondary | ICD-10-CM | POA: Diagnosis present

## 2022-01-03 DIAGNOSIS — K521 Toxic gastroenteritis and colitis: Secondary | ICD-10-CM | POA: Diagnosis present

## 2022-01-03 DIAGNOSIS — Z90411 Acquired partial absence of pancreas: Secondary | ICD-10-CM | POA: Diagnosis not present

## 2022-01-03 DIAGNOSIS — Z833 Family history of diabetes mellitus: Secondary | ICD-10-CM | POA: Diagnosis not present

## 2022-01-03 DIAGNOSIS — K520 Gastroenteritis and colitis due to radiation: Secondary | ICD-10-CM | POA: Diagnosis present

## 2022-01-03 DIAGNOSIS — T451X5A Adverse effect of antineoplastic and immunosuppressive drugs, initial encounter: Secondary | ICD-10-CM | POA: Diagnosis present

## 2022-01-03 DIAGNOSIS — Z8509 Personal history of malignant neoplasm of other digestive organs: Secondary | ICD-10-CM | POA: Diagnosis not present

## 2022-01-03 DIAGNOSIS — Z7982 Long term (current) use of aspirin: Secondary | ICD-10-CM | POA: Diagnosis not present

## 2022-01-03 DIAGNOSIS — Z9221 Personal history of antineoplastic chemotherapy: Secondary | ICD-10-CM | POA: Diagnosis not present

## 2022-01-03 DIAGNOSIS — W881XXA Exposure to radioactive isotopes, initial encounter: Secondary | ICD-10-CM | POA: Diagnosis present

## 2022-01-03 DIAGNOSIS — Z818 Family history of other mental and behavioral disorders: Secondary | ICD-10-CM | POA: Diagnosis not present

## 2022-01-03 DIAGNOSIS — R188 Other ascites: Secondary | ICD-10-CM | POA: Diagnosis present

## 2022-01-03 DIAGNOSIS — F17210 Nicotine dependence, cigarettes, uncomplicated: Secondary | ICD-10-CM | POA: Diagnosis present

## 2022-01-03 DIAGNOSIS — K285 Chronic or unspecified gastrojejunal ulcer with perforation: Secondary | ICD-10-CM | POA: Diagnosis present

## 2022-01-03 DIAGNOSIS — F319 Bipolar disorder, unspecified: Secondary | ICD-10-CM | POA: Diagnosis present

## 2022-01-03 DIAGNOSIS — Z923 Personal history of irradiation: Secondary | ICD-10-CM | POA: Diagnosis not present

## 2022-01-03 DIAGNOSIS — Z79899 Other long term (current) drug therapy: Secondary | ICD-10-CM | POA: Diagnosis not present

## 2022-01-03 DIAGNOSIS — J449 Chronic obstructive pulmonary disease, unspecified: Secondary | ICD-10-CM | POA: Diagnosis present

## 2022-01-03 DIAGNOSIS — K219 Gastro-esophageal reflux disease without esophagitis: Secondary | ICD-10-CM | POA: Diagnosis present

## 2022-01-03 DIAGNOSIS — I10 Essential (primary) hypertension: Secondary | ICD-10-CM | POA: Diagnosis present

## 2022-01-03 DIAGNOSIS — E119 Type 2 diabetes mellitus without complications: Secondary | ICD-10-CM | POA: Diagnosis present

## 2022-01-03 HISTORY — PX: LAPAROTOMY: SHX154

## 2022-01-03 HISTORY — PX: OTHER SURGICAL HISTORY: SHX169

## 2022-01-03 LAB — COMPREHENSIVE METABOLIC PANEL
ALT: 98 U/L — ABNORMAL HIGH (ref 0–44)
AST: 65 U/L — ABNORMAL HIGH (ref 15–41)
Albumin: 3.1 g/dL — ABNORMAL LOW (ref 3.5–5.0)
Alkaline Phosphatase: 622 U/L — ABNORMAL HIGH (ref 38–126)
Anion gap: 8 (ref 5–15)
BUN: 9 mg/dL (ref 8–23)
CO2: 26 mmol/L (ref 22–32)
Calcium: 8.2 mg/dL — ABNORMAL LOW (ref 8.9–10.3)
Chloride: 102 mmol/L (ref 98–111)
Creatinine, Ser: 0.58 mg/dL — ABNORMAL LOW (ref 0.61–1.24)
GFR, Estimated: >60 mL/min (ref >60)
Glucose, Bld: 111 mg/dL — ABNORMAL HIGH (ref 70–99)
Potassium: 3.4 mmol/L — ABNORMAL LOW (ref 3.5–5.1)
Sodium: 136 mmol/L (ref 135–145)
Total Bilirubin: 0.8 mg/dL (ref 0.3–1.2)
Total Protein: 6.3 g/dL — ABNORMAL LOW (ref 6.5–8.1)

## 2022-01-03 LAB — CBC
HCT: 43.7 % (ref 39.0–52.0)
Hemoglobin: 15 g/dL (ref 13.0–17.0)
MCH: 34.5 pg — ABNORMAL HIGH (ref 26.0–34.0)
MCHC: 34.3 g/dL (ref 30.0–36.0)
MCV: 100.5 fL — ABNORMAL HIGH (ref 80.0–100.0)
Platelets: 183 10*3/uL (ref 150–400)
RBC: 4.35 MIL/uL (ref 4.22–5.81)
RDW: 13.1 % (ref 11.5–15.5)
WBC: 11.5 10*3/uL — ABNORMAL HIGH (ref 4.0–10.5)
nRBC: 0 % (ref 0.0–0.2)

## 2022-01-03 SURGERY — LAPAROTOMY, EXPLORATORY
Anesthesia: General | Site: Abdomen

## 2022-01-03 MED ORDER — ONDANSETRON HCL 4 MG/2ML IJ SOLN: 4.0000 mg | Freq: Four times a day (QID) | INTRAMUSCULAR | Status: DC | PRN

## 2022-01-03 MED ORDER — HYDROMORPHONE HCL 1 MG/ML IJ SOLN: 0.2500 mg | INTRAMUSCULAR | Status: DC | PRN

## 2022-01-03 MED ORDER — SUCCINYLCHOLINE CHLORIDE 200 MG/10ML IV SOSY
PREFILLED_SYRINGE | INTRAVENOUS | Status: DC | PRN
  Administered 2022-01-03: 100 mg via INTRAVENOUS

## 2022-01-03 MED ORDER — PANTOPRAZOLE SODIUM 40 MG IV SOLR
40.0000 mg | Freq: Two times a day (BID) | INTRAVENOUS | Status: DC
  Administered 2022-01-03: 40 mg via INTRAVENOUS
  Filled 2022-01-03: qty 10

## 2022-01-03 MED ORDER — DIPHENHYDRAMINE HCL 50 MG/ML IJ SOLN: 12.5000 mg | Freq: Four times a day (QID) | INTRAMUSCULAR | Status: DC | PRN

## 2022-01-03 MED ORDER — PHENYLEPHRINE HCL-NACL 20-0.9 MG/250ML-% IV SOLN
INTRAVENOUS | Status: AC
  Filled 2022-01-03: qty 250

## 2022-01-03 MED ORDER — ACETAMINOPHEN 325 MG PO TABS: 650.0000 mg | ORAL_TABLET | Freq: Four times a day (QID) | ORAL | Status: DC | PRN

## 2022-01-03 MED ORDER — SODIUM CHLORIDE 0.9% FLUSH: 9.0000 mL | INTRAVENOUS | Status: DC | PRN

## 2022-01-03 MED ORDER — LIDOCAINE 2% (20 MG/ML) 5 ML SYRINGE
INTRAMUSCULAR | Status: DC | PRN
  Administered 2022-01-03: 100 mg via INTRAVENOUS

## 2022-01-03 MED ORDER — ONDANSETRON HCL 4 MG/2ML IJ SOLN
INTRAMUSCULAR | Status: DC | PRN
  Administered 2022-01-03: 4 mg via INTRAVENOUS

## 2022-01-03 MED ORDER — PROMETHAZINE HCL 25 MG/ML IJ SOLN: 6.2500 mg | INTRAMUSCULAR | Status: DC | PRN

## 2022-01-03 MED ORDER — ONDANSETRON 4 MG PO TBDP: 4.0000 mg | ORAL_TABLET | Freq: Four times a day (QID) | ORAL | Status: DC | PRN

## 2022-01-03 MED ORDER — FENTANYL CITRATE (PF) 100 MCG/2ML IJ SOLN
INTRAMUSCULAR | Status: DC | PRN
  Administered 2022-01-03: 100 ug via INTRAVENOUS

## 2022-01-03 MED ORDER — ENOXAPARIN SODIUM 40 MG/0.4ML IJ SOSY
40.0000 mg | PREFILLED_SYRINGE | INTRAMUSCULAR | Status: DC
  Administered 2022-01-04 – 2022-01-07 (×4): 40 mg via SUBCUTANEOUS
  Filled 2022-01-03 (×6): qty 0.4

## 2022-01-03 MED ORDER — ACETAMINOPHEN 650 MG RE SUPP: 650.0000 mg | Freq: Four times a day (QID) | RECTAL | Status: DC | PRN

## 2022-01-03 MED ORDER — DIPHENHYDRAMINE HCL 12.5 MG/5ML PO ELIX: 12.5000 mg | ORAL_SOLUTION | Freq: Four times a day (QID) | ORAL | Status: DC | PRN

## 2022-01-03 MED ORDER — PIPERACILLIN-TAZOBACTAM 3.375 G IVPB
INTRAVENOUS | Status: AC
  Administered 2022-01-03: 3.375 g via INTRAVENOUS
  Filled 2022-01-03: qty 50

## 2022-01-03 MED ORDER — ACETAMINOPHEN 10 MG/ML IV SOLN
INTRAVENOUS | Status: DC | PRN
  Administered 2022-01-03: 1000 mg via INTRAVENOUS

## 2022-01-03 MED ORDER — EPHEDRINE SULFATE-NACL 50-0.9 MG/10ML-% IV SOSY
PREFILLED_SYRINGE | INTRAVENOUS | Status: DC | PRN
  Administered 2022-01-03 (×3): 5 mg via INTRAVENOUS

## 2022-01-03 MED ORDER — PANTOPRAZOLE INFUSION (NEW) - SIMPLE MED
8.0000 mg/h | INTRAVENOUS | Status: DC
  Administered 2022-01-03 – 2022-01-04 (×2): 8 mg/h via INTRAVENOUS
  Filled 2022-01-03 (×2): qty 80

## 2022-01-03 MED ORDER — KETAMINE HCL 10 MG/ML IJ SOLN
INTRAMUSCULAR | Status: DC | PRN
  Administered 2022-01-03: 10 mg via INTRAVENOUS
  Administered 2022-01-03 (×2): 20 mg via INTRAVENOUS

## 2022-01-03 MED ORDER — ACETAMINOPHEN 10 MG/ML IV SOLN
INTRAVENOUS | Status: AC
  Filled 2022-01-03: qty 100

## 2022-01-03 MED ORDER — 0.9 % SODIUM CHLORIDE (POUR BTL) OPTIME
TOPICAL | Status: DC | PRN
  Administered 2022-01-03: 5000 mL

## 2022-01-03 MED ORDER — NALOXONE HCL 0.4 MG/ML IJ SOLN: 0.4000 mg | INTRAMUSCULAR | Status: DC | PRN

## 2022-01-03 MED ORDER — KETAMINE HCL 50 MG/5ML IJ SOSY
PREFILLED_SYRINGE | INTRAMUSCULAR | Status: AC
  Filled 2022-01-03: qty 5

## 2022-01-03 MED ORDER — DEXAMETHASONE SODIUM PHOSPHATE 10 MG/ML IJ SOLN
INTRAMUSCULAR | Status: DC | PRN
  Administered 2022-01-03: 4 mg via INTRAVENOUS

## 2022-01-03 MED ORDER — PANTOPRAZOLE SODIUM 40 MG IV SOLR
40.0000 mg | Freq: Two times a day (BID) | INTRAVENOUS | Status: DC
  Filled 2022-01-03: qty 10

## 2022-01-03 MED ORDER — PROPOFOL 10 MG/ML IV BOLUS
INTRAVENOUS | Status: DC | PRN
  Administered 2022-01-03: 150 mg via INTRAVENOUS

## 2022-01-03 MED ORDER — MORPHINE SULFATE 1 MG/ML IV SOLN PCA
INTRAVENOUS | Status: DC
  Administered 2022-01-03: 6 mL via INTRAVENOUS
  Administered 2022-01-03: 1.5 mg via INTRAVENOUS
  Administered 2022-01-03: 6 mg via INTRAVENOUS
  Filled 2022-01-03 (×2): qty 30

## 2022-01-03 MED ORDER — PANTOPRAZOLE 80MG IVPB - SIMPLE MED
80.0000 mg | Freq: Once | INTRAVENOUS | Status: AC
  Administered 2022-01-03: 80 mg via INTRAVENOUS
  Filled 2022-01-03: qty 80

## 2022-01-03 MED ORDER — PIPERACILLIN-TAZOBACTAM 3.375 G IVPB
3.3750 g | Freq: Three times a day (TID) | INTRAVENOUS | Status: AC
  Administered 2022-01-03 – 2022-01-06 (×10): 3.375 g via INTRAVENOUS
  Filled 2022-01-03 (×11): qty 50

## 2022-01-03 MED ORDER — METHOCARBAMOL 500 MG PO TABS: 500.0000 mg | ORAL_TABLET | Freq: Four times a day (QID) | ORAL | Status: DC | PRN

## 2022-01-03 MED ORDER — SUGAMMADEX SODIUM 200 MG/2ML IV SOLN
INTRAVENOUS | Status: DC | PRN
  Administered 2022-01-03: 200 mg via INTRAVENOUS

## 2022-01-03 MED ORDER — ROCURONIUM BROMIDE 10 MG/ML (PF) SYRINGE
PREFILLED_SYRINGE | INTRAVENOUS | Status: DC | PRN
  Administered 2022-01-03: 30 mg via INTRAVENOUS
  Administered 2022-01-03: 20 mg via INTRAVENOUS

## 2022-01-03 MED ORDER — HYDRALAZINE HCL 20 MG/ML IJ SOLN: 10.0000 mg | INTRAMUSCULAR | Status: DC | PRN

## 2022-01-03 SURGICAL SUPPLY — 41 items
APPLICATOR COTTON TIP 6 STRL (MISCELLANEOUS) ×1 IMPLANT
APPLICATOR COTTON TIP 6IN STRL (MISCELLANEOUS) ×1 IMPLANT
BAG COUNTER SPONGE SURGICOUNT (BAG) IMPLANT
BLADE EXTENDED COATED 6.5IN (ELECTRODE) IMPLANT
BLADE HEX COATED 2.75 (ELECTRODE) ×1 IMPLANT
BNDG GAUZE DERMACEA FLUFF 4 (GAUZE/BANDAGES/DRESSINGS) IMPLANT
COVER MAYO STAND STRL (DRAPES) IMPLANT
COVER SURGICAL LIGHT HANDLE (MISCELLANEOUS) ×1 IMPLANT
DRAIN CHANNEL 19F RND (DRAIN) IMPLANT
DRAPE LAPAROSCOPIC ABDOMINAL (DRAPES) ×1 IMPLANT
DRAPE UTILITY XL STRL (DRAPES) ×1 IMPLANT
DRAPE WARM FLUID 44X44 (DRAPES) IMPLANT
ELECT REM PT RETURN 15FT ADLT (MISCELLANEOUS) ×1 IMPLANT
EVACUATOR SILICONE 100CC (DRAIN) IMPLANT
GAUZE PAD ABD 8X10 STRL (GAUZE/BANDAGES/DRESSINGS) IMPLANT
GAUZE SPONGE 4X4 12PLY STRL (GAUZE/BANDAGES/DRESSINGS) ×1 IMPLANT
GLOVE BIO SURGEON STRL SZ7 (GLOVE) ×1 IMPLANT
GLOVE BIO SURGEON STRL SZ8 (GLOVE) IMPLANT
GLOVE BIOGEL PI IND STRL 7.0 (GLOVE) ×1 IMPLANT
GLOVE BIOGEL PI IND STRL 7.5 (GLOVE) ×1 IMPLANT
GLOVE BIOGEL PI IND STRL 8 (GLOVE) IMPLANT
GLOVE INDICATOR 6.5 STRL GRN (GLOVE) IMPLANT
GOWN STRL REUS W/ TWL LRG LVL3 (GOWN DISPOSABLE) ×2 IMPLANT
GOWN STRL REUS W/TWL LRG LVL3 (GOWN DISPOSABLE) ×2 IMPLANT
GOWN STRL REUS W/TWL XL LVL3 (GOWN DISPOSABLE) IMPLANT
HANDLE SUCTION POOLE (INSTRUMENTS) IMPLANT
KIT BASIN OR (CUSTOM PROCEDURE TRAY) ×1 IMPLANT
KIT TURNOVER KIT A (KITS) IMPLANT
PACK GENERAL/GYN (CUSTOM PROCEDURE TRAY) ×1 IMPLANT
STAPLER VISISTAT 35W (STAPLE) ×1 IMPLANT
SUCTION POOLE HANDLE (INSTRUMENTS) ×1
SUT ETHILON 2 0 PS N (SUTURE) IMPLANT
SUT PDS AB 0 CTX 60 (SUTURE) IMPLANT
SUT SILK 2 0 SH CR/8 (SUTURE) IMPLANT
SUT SILK 3 0 (SUTURE)
SUT SILK 3 0 SH CR/8 (SUTURE) IMPLANT
SUT SILK 3-0 18XBRD TIE 12 (SUTURE) IMPLANT
TAPE CLOTH SURG 6X10 WHT LF (GAUZE/BANDAGES/DRESSINGS) IMPLANT
TOWEL OR 17X26 10 PK STRL BLUE (TOWEL DISPOSABLE) ×2 IMPLANT
TRAY FOLEY MTR SLVR 16FR STAT (SET/KITS/TRAYS/PACK) ×1 IMPLANT
YANKAUER SUCT BULB TIP NO VENT (SUCTIONS) IMPLANT

## 2022-01-03 NOTE — Progress Notes (Signed)
[name] [name]   DOB:28-May-1955   HA#:[id number]   XBD#:[id number]  Oncology follow up   Subjective: Patient is well-known to me, under my care for his history of extrahepatic cholangiocarcinoma.  He presented with severe sudden onset lower abdominal pain, and presented to ED yesterday.  CT scan showed free air and free fluid in the abdomen and the pelvis, patient underwent immediate surgical exploration today.  Saw him after he came back to his room, pain is tolerable, no nausea.   Objective:  Vitals:   01/03/22 1300 01/03/22 1418  BP: (!) 160/94 (!) 168/96  Pulse: 74 69  Resp: 14 16  Temp:    SpO2: 100% 95%    Body mass index is 20.37 kg/m.  Intake/Output Summary (Last 24 hours) at 01/03/2022 1633 Last data filed at 01/03/2022 1419 Gross per 24 hour  Intake 3214.08 ml  Output 1845 ml  Net 1369.08 ml     Sclerae unicteric  Oropharynx clear  No peripheral adenopathy  Lungs clear -- no rales or rhonchi  Heart regular rate and rhythm  Abdomen: mid and low abdomen covered by gauze, mild tenderness   MSK no focal spinal tenderness, no peripheral edema  Neuro nonfocal    CBG (last 3)  No results for input(s): "GLUCAP" in the last 72 hours.   Labs:  Urine Studies No results for input(s): "UHGB", "CRYS" in the last 72 hours.  Invalid input(s): "UACOL", "UAPR", "USPG", "UPH", "UTP", "UGL", "UKET", "UBIL", "UNIT", "UROB", "ULEU", "UEPI", "UWBC", "URBC", "UBAC", "CAST", "UCOM", "BILUA"  Basic Metabolic Panel: Recent Labs  Lab 01/02/22 2058 01/03/22 0746  NA 137 136  K 3.3* 3.4*  CL 102 102  CO2 29 26  GLUCOSE 91 111*  BUN 14 9  CREATININE 0.51* 0.58*  CALCIUM 8.4* 8.2*   GFR Estimated Creatinine Clearance: 85 mL/min (A) (by C-G formula based on SCr of 0.58 mg/dL (L)). Liver Function Tests: Recent Labs  Lab 01/02/22 2058 01/03/22 0746  AST 99* 65*  ALT 119* 98*  ALKPHOS 725* 622*  BILITOT 0.8 0.8  PROT 6.4* 6.3*  ALBUMIN 3.4* 3.1*   Recent Labs  Lab  01/02/22 2058  LIPASE 22   No results for input(s): "AMMONIA" in the last 168 hours. Coagulation profile No results for input(s): "INR", "PROTIME" in the last 168 hours.  CBC: Recent Labs  Lab 01/02/22 2058 01/03/22 0746  WBC 6.9 11.5*  NEUTROABS 6.4  --   HGB 13.5 15.0  HCT 40.6 43.7  MCV 101.5* 100.5*  PLT 187 183   Cardiac Enzymes: No results for input(s): "CKTOTAL", "CKMB", "CKMBINDEX", "TROPONINI" in the last 168 hours. BNP: Invalid input(s): "POCBNP" CBG: No results for input(s): "GLUCAP" in the last 168 hours. D-Dimer No results for input(s): "DDIMER" in the last 72 hours. Hgb A1c No results for input(s): "HGBA1C" in the last 72 hours. Lipid Profile No results for input(s): "CHOL", "HDL", "LDLCALC", "TRIG", "CHOLHDL", "LDLDIRECT" in the last 72 hours. Thyroid function studies No results for input(s): "TSH", "T4TOTAL", "T3FREE", "THYROIDAB" in the last 72 hours.  Invalid input(s): "FREET3" Anemia work up No results for input(s): "VITAMINB12", "FOLATE", "FERRITIN", "TIBC", "IRON", "RETICCTPCT" in the last 72 hours. Microbiology No results found for this or any previous visit (from the past 240 hour(s)).    Studies:  CT Angio Chest/Abd/Pel for Dissection W and/or Wo Contrast  Result Date: 01/02/2022 CLINICAL DATA:  History of cholangiocarcinoma status post Whipple procedure, sudden onset lower abdominal pain radiating to upper abdomen, bilateral shoulder pain, near  syncope EXAM: CT ANGIOGRAPHY CHEST, ABDOMEN AND PELVIS TECHNIQUE: Non-contrast CT of the chest was initially obtained. Multidetector CT imaging through the chest, abdomen and pelvis was performed using the standard protocol during bolus administration of intravenous contrast. Multiplanar reconstructed images and MIPs were obtained and reviewed to evaluate the vascular anatomy. RADIATION DOSE REDUCTION: This exam was performed according to the departmental dose-optimization program which includes automated  exposure control, adjustment of the mA and/or kV according to patient size and/or use of iterative reconstruction technique. CONTRAST:  163m OMNIPAQUE IOHEXOL 350 MG/ML SOLN COMPARISON:  11/21/2021, 08/07/2021 FINDINGS: CTA CHEST FINDINGS Cardiovascular: No evidence of thoracic aortic aneurysm or dissection. Stable atherosclerosis of the aortic arch. The heart is unremarkable without pericardial effusion. No filling defects or pulmonary emboli. Mediastinum/Nodes: No enlarged mediastinal, hilar, or axillary lymph nodes. Thyroid gland, trachea, and esophagus demonstrate no significant findings. Lungs/Pleura: Stable emphysema and bibasilar fibrosis. No acute airspace disease, effusion, or pneumothorax. Central airways are patent. Musculoskeletal: No acute or destructive bony lesions. Reconstructed images demonstrate no additional findings. Review of the MIP images confirms the above findings. CTA ABDOMEN AND PELVIS FINDINGS VASCULAR Aorta: Normal caliber aorta without aneurysm, dissection, vasculitis or significant stenosis. Diffuse atherosclerosis. Celiac: Patent without evidence of aneurysm, dissection, vasculitis or significant stenosis. SMA: Patent without evidence of aneurysm, dissection, vasculitis or significant stenosis. Anatomic variant with replaced right hepatic artery off the SMA. Renals: Both renal arteries are patent without evidence of aneurysm, dissection, vasculitis, fibromuscular dysplasia or significant stenosis. Mild atherosclerosis at the origin of the right renal artery. IMA: Patent without evidence of aneurysm, dissection, vasculitis or significant stenosis. Inflow: Patent without evidence of aneurysm, dissection, vasculitis or significant stenosis. Diffuse atherosclerosis. Veins: No obvious venous abnormality within the limitations of this arterial phase study. Review of the MIP images confirms the above findings. NON-VASCULAR Hepatobiliary: Stable pneumobilia related to prior cholecystectomy  and choledocho jejunostomy. The liver is grossly unremarkable with no focal abnormality on this arterial phase exam. Pancreas: Prior resection of the pancreatic head. Remainder of the pancreatic tissue is somewhat atrophic. No inflammatory changes or pancreatic duct dilation. Spleen: Normal in size without focal abnormality. Adrenals/Urinary Tract: Stable obstructing 5 mm proximal right ureteral calculus, with mild right-sided hydronephrosis again noted. There is a 3 mm nonobstructing left renal calculus. No left-sided obstruction. The adrenals and bladder are unremarkable. Stomach/Bowel: Evaluation of the bowel is limited without oral contrast. Postsurgical changes are seen from prior Whipple procedure, with distal gastrectomy and duodenectomy. No evidence of bowel obstruction or ileus. Jejunal wall thickening is seen within the right mid abdomen, reference image 141/6. There is moderate gaseous distention of the stomach, with mural thickening of the gastric antrum. Free gas within the upper abdomen is consistent with perforated viscus or anastomotic breakdown at site of prior Whipple procedure. Surgical consultation is recommended. Lymphatic: No pathologic adenopathy within the abdomen or pelvis. Reproductive: Prostate is unremarkable. Other: Free fluid is seen primarily within the left upper quadrant and lower pelvis. Pneumoperitoneum within the upper abdomen as above. No abdominal wall hernia. Musculoskeletal: No acute displaced fractures. Bilateral femoral head avascular necrosis again noted. Reconstructed images demonstrate no additional findings. Review of the MIP images confirms the above findings. IMPRESSION: 1. Pneumoperitoneum, consistent with perforated viscus or anastomotic breakdown at site of prior Whipple procedure. The exact source of the free gas is not visualized on CT, though wall thickening of the gastric antrum and right upper quadrant small bowel is noted and could reflect site of perforation.  Surgical consultation  recommended. 2. No evidence of thoracoabdominal aortic aneurysm or dissection. 3. Free fluid within the left upper quadrant and lower pelvis. 4. Stable obstructing 5 mm proximal right ureteral calculus with mild right-sided hydronephrosis. 5. Nonobstructing 3 mm left renal calculus. 6. Aortic Atherosclerosis (ICD10-I70.0) and Emphysema (ICD10-J43.9). Critical Value/emergent results were called by telephone at the time of interpretation on 01/02/2022 at 10:29 pm to provider JOSHUA LONG , who verbally acknowledged these results. Electronically Signed   By: Randa Ngo M.D.   On: 01/02/2022 22:29    Assessment: 66 y.o. male   Acute bowel perforation at G-J anastomosis, status post exploratory laparoscopy Chronic diarrhea, stool incontinence History of extrahepatic cholangiocarcinoma, status post Whipple surgery and adjuvant chemotherapy and radiation Abdominal hernia repair in August 2023  Plan:  -I reviewed his CT scan and Dr. Vonna Kotyk operation node, no clear evidence of cancer recurrence so far. He does have a PET scan scheduled for 11/30, will proceed if he recovers well by then.  -his chronic diarrhea is likely related to his Whipple surgery, radiation and chemo.  Plan to increase his pancrelipase dose when he started eating again -I will see him back after his hospital discharge    [name], MD 01/03/2022  4:33 PM

## 2022-01-03 NOTE — Progress Notes (Signed)
Pharmacy Antibiotic Note  [name] [name] is a 66 y.o. male admitted on 01/02/2022 with lower abdominal pain. Pt  with a history of stage 1 cholangiocarcinoma s/p Whipple procedure  .  Pharmacy has been consulted for zosyn dosing.  Plan: Zosyn 3.375g IV Q8H infused over 4hrs. Pharmacy will sign off and follow peripherally  Height: '5\' 10"'$  (177.8 cm) Weight: 64.4 kg (142 lb) IBW/kg (Calculated) : 73  Temp (24hrs), Avg:99.1 F (37.3 C), Min:98.4 F (36.9 C), Max:99.3 F (37.4 C)  Recent Labs  Lab 12/27/21 1020 01/02/22 2058  WBC 4.8 6.9  CREATININE 0.60* 0.51*    Estimated Creatinine Clearance: 85 mL/min (A) (by C-G formula based on SCr of 0.51 mg/dL (L)).    No Known Allergies   Thank you for allowing pharmacy to be a part of this patient's care.  Dolly Rias RPh 01/03/2022, 2:42 AM

## 2022-01-03 NOTE — Anesthesia Procedure Notes (Signed)
Procedure Name: Intubation Date/Time: 01/03/2022 12:21 AM  Performed by: Milford Cage, CRNAPre-anesthesia Checklist: Patient identified, Emergency Drugs available, Suction available and Patient being monitored Patient Re-evaluated:Patient Re-evaluated prior to induction Oxygen Delivery Method: Circle system utilized Preoxygenation: Pre-oxygenation with 100% oxygen Induction Type: IV induction, Rapid sequence and Cricoid Pressure applied Laryngoscope Size: Miller and 2 Grade View: Grade II Tube type: Oral Tube size: 7.5 mm Number of attempts: 1 Airway Equipment and Method: Stylet Placement Confirmation: ETT inserted through vocal cords under direct vision, positive ETCO2 and breath sounds checked- equal and bilateral Secured at: 23 cm Tube secured with: Tape Dental Injury: Teeth and Oropharynx as per pre-operative assessment

## 2022-01-03 NOTE — Progress Notes (Signed)
Making rounds on pt w night shift nurse, pt has shut off his IV pump running his protonix gtt and has also shut off his CO2 sensor attached to his Morphine PCA pump. Pt up in chair w wife at bedside, explained rationale for protonix gtt (in pt;s LAC) and safety feature of PCA pump. Pt is agitated and verbally aggressive, states he will leave AMA if we cut the machines back on, wife states that pt "has PTSD" and cannot listen to any alarms. Allowed pt to vent and will reach out to MD for further orders, will cont to monitor.

## 2022-01-03 NOTE — Anesthesia Postprocedure Evaluation (Signed)
Anesthesia Post Note  Patient: [name] [name]  Procedure(s) Performed: EXPLORATORY LAPAROTOMY, GRAHAM PATCH REPAIR OF PERFORATED ULCER (Abdomen)     Patient location during evaluation: PACU Anesthesia Type: General Level of consciousness: awake Pain management: pain level controlled Vital Signs Assessment: post-procedure vital signs reviewed and stable Respiratory status: spontaneous breathing, nonlabored ventilation and respiratory function stable Cardiovascular status: blood pressure returned to baseline and stable Postop Assessment: no apparent nausea or vomiting Anesthetic complications: no   No notable events documented.  Last Vitals:  Vitals:   01/03/22 0525 01/03/22 0600  BP:  (!) 153/97  Pulse:  84  Resp:  17  Temp:  36.8 C  SpO2: 97% 100%    Last Pain:  Vitals:   01/03/22 0600  TempSrc:   PainSc: 0-No pain                 Nilda Simmer

## 2022-01-03 NOTE — Progress Notes (Signed)
Verbal order from Dr. Brantley Stage that it's ok to d/c Morphine PCA.

## 2022-01-03 NOTE — Progress Notes (Signed)
Pharmacy made aware and notified that 21 mg of Morphine PCA was wasted with nurse Vivien Rota.

## 2022-01-03 NOTE — Transfer of Care (Signed)
Immediate Anesthesia Transfer of Care Note  Patient: KENJI MAPEL  Procedure(s) Performed: EXPLORATORY LAPAROTOMY, GRAHAM PATCH REPAIR OF PERFORATED ULCER (Abdomen)  Patient Location: PACU  Anesthesia Type:General  Level of Consciousness: drowsy  Airway & Oxygen Therapy: Patient Spontanous Breathing and Patient connected to face mask oxygen  Post-op Assessment: Report given to RN and Post -op Vital signs reviewed and stable  Post vital signs: Reviewed and stable  Last Vitals:  Vitals Value Taken Time  BP 161/94 01/03/22 0123  Temp    Pulse 87 01/03/22 0126  Resp 23 01/03/22 0126  SpO2 100 % 01/03/22 0126  Vitals shown include unvalidated device data.  Last Pain:  Vitals:   01/03/22 0005  TempSrc:   PainSc: 0-No pain         Complications: No notable events documented.

## 2022-01-03 NOTE — Progress Notes (Signed)
Morphine PCA vial was damaged the vial was cracked and leaking called Pharmacist to inform and Rn co worker Malachy Mood was a witness to the cracked vial leaking as well. We wasted the morphine whole vial wasted in Therapist, occupational and Cheryl,Rn we notified pharmacy to send new morphine PCA vial to replace the broken one.

## 2022-01-03 NOTE — Progress Notes (Signed)
Progress Note  Day of Surgery  Subjective: Evaluated in PACU. Not using PCA much. Patient denies NSAID use or recent steroids. 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 Reports he currently smokes 1.5 PPD.   Objective: Vital signs in last 24 hours: Temp:  [97.9 F (36.6 C)-99.3 F (37.4 C)] 97.9 F (36.6 C) (11/15 0700) Pulse Rate:  [62-101] 85 (11/15 0700) Resp:  [13-22] 17 (11/15 0700) BP: (144-169)/(78-97) 151/95 (11/15 0700) SpO2:  [92 %-100 %] 100 % (11/15 0700) FiO2 (%):  [0 %] 0 % (11/15 0525) Weight:  [64.4 kg] 64.4 kg (11/14 1938)    Intake/Output from previous day: 11/14 0701 - 11/15 0700 In: 2150 [I.V.:1500; IV Piggyback:650] Out: 1295 [Urine:875; Drains:320; Blood:100] Intake/Output this shift: No intake/output data recorded.  PE: General: pleasant, WD, WN male who is laying in bed in NAD Heart: regular, rate, and rhythm.  Lungs:  Respiratory effort nonlabored Abd: soft, appropriately ttp, mild distention, NGT with bilious drainage, JP with SS fluid, midline wound with dressing C/D/I GU: foley present with clear urine  MS: all 4 extremities are symmetrical with no cyanosis, clubbing, or edema. Skin: warm and dry with no masses, lesions, or rashes Psych: A&Ox3 with an appropriate affect.    Lab Results:  Recent Labs    01/02/22 2058 01/03/22 0746  WBC 6.9 11.5*  HGB 13.5 15.0  HCT 40.6 43.7  PLT 187 183   BMET Recent Labs    01/02/22 2058 01/03/22 0746  NA 137 136  K 3.3* 3.4*  CL 102 102  CO2 29 26  GLUCOSE 91 111*  BUN 14 9  CREATININE 0.51* 0.58*  CALCIUM 8.4* 8.2*   PT/INR No results for input(s): "LABPROT", "INR" in the last 72 hours. CMP     Component Value Date/Time   NA 136 01/03/2022 0746   NA 140 03/28/2020 0000   K 3.4 (L) 01/03/2022 0746   CL 102 01/03/2022 0746   CO2 26 01/03/2022 0746   GLUCOSE 111 (H) 01/03/2022 0746   BUN 9 01/03/2022 0746   BUN 9 03/28/2020 0000   CREATININE 0.58 (L) 01/03/2022 0746    CREATININE 0.60 (L) 12/27/2021 1020   CREATININE 0.73 10/30/2013 1606   CALCIUM 8.2 (L) 01/03/2022 0746   PROT 6.3 (L) 01/03/2022 0746   ALBUMIN 3.1 (L) 01/03/2022 0746   AST 65 (H) 01/03/2022 0746   AST 111 (H) 12/27/2021 1020   ALT 98 (H) 01/03/2022 0746   ALT 111 (H) 12/27/2021 1020   ALKPHOS 622 (H) 01/03/2022 0746   BILITOT 0.8 01/03/2022 0746   BILITOT 0.6 12/27/2021 1020   GFRNONAA >60 01/03/2022 0746   GFRNONAA >60 12/27/2021 1020   GFRNONAA >89 10/30/2013 1606   GFRAA 108 03/28/2020 0000   GFRAA >89 10/30/2013 1606   Lipase     Component Value Date/Time   LIPASE 22 01/02/2022 2058       Studies/Results: CT Angio Chest/Abd/Pel for Dissection W and/or Wo Contrast  Result Date: 01/02/2022 CLINICAL DATA:  History of cholangiocarcinoma status post Whipple procedure, sudden onset lower abdominal pain radiating to upper abdomen, bilateral shoulder pain, near syncope EXAM: CT ANGIOGRAPHY CHEST, ABDOMEN AND PELVIS TECHNIQUE: Non-contrast CT of the chest was initially obtained. Multidetector CT imaging through the chest, abdomen and pelvis was performed using the standard protocol during bolus administration of intravenous contrast. Multiplanar reconstructed images and MIPs were obtained and reviewed to evaluate the vascular anatomy. RADIATION DOSE REDUCTION: This exam was performed according to the departmental  dose-optimization program which includes automated exposure control, adjustment of the mA and/or kV according to patient size and/or use of iterative reconstruction technique. CONTRAST:  126m OMNIPAQUE IOHEXOL 350 MG/ML SOLN COMPARISON:  11/21/2021, 08/07/2021 FINDINGS: CTA CHEST FINDINGS Cardiovascular: No evidence of thoracic aortic aneurysm or dissection. Stable atherosclerosis of the aortic arch. The heart is unremarkable without pericardial effusion. No filling defects or pulmonary emboli. Mediastinum/Nodes: No enlarged mediastinal, hilar, or axillary lymph nodes. Thyroid  gland, trachea, and esophagus demonstrate no significant findings. Lungs/Pleura: Stable emphysema and bibasilar fibrosis. No acute airspace disease, effusion, or pneumothorax. Central airways are patent. Musculoskeletal: No acute or destructive bony lesions. Reconstructed images demonstrate no additional findings. Review of the MIP images confirms the above findings. CTA ABDOMEN AND PELVIS FINDINGS VASCULAR Aorta: Normal caliber aorta without aneurysm, dissection, vasculitis or significant stenosis. Diffuse atherosclerosis. Celiac: Patent without evidence of aneurysm, dissection, vasculitis or significant stenosis. SMA: Patent without evidence of aneurysm, dissection, vasculitis or significant stenosis. Anatomic variant with replaced right hepatic artery off the SMA. Renals: Both renal arteries are patent without evidence of aneurysm, dissection, vasculitis, fibromuscular dysplasia or significant stenosis. Mild atherosclerosis at the origin of the right renal artery. IMA: Patent without evidence of aneurysm, dissection, vasculitis or significant stenosis. Inflow: Patent without evidence of aneurysm, dissection, vasculitis or significant stenosis. Diffuse atherosclerosis. Veins: No obvious venous abnormality within the limitations of this arterial phase study. Review of the MIP images confirms the above findings. NON-VASCULAR Hepatobiliary: Stable pneumobilia related to prior cholecystectomy and choledocho jejunostomy. The liver is grossly unremarkable with no focal abnormality on this arterial phase exam. Pancreas: Prior resection of the pancreatic head. Remainder of the pancreatic tissue is somewhat atrophic. No inflammatory changes or pancreatic duct dilation. Spleen: Normal in size without focal abnormality. Adrenals/Urinary Tract: Stable obstructing 5 mm proximal right ureteral calculus, with mild right-sided hydronephrosis again noted. There is a 3 mm nonobstructing left renal calculus. No left-sided  obstruction. The adrenals and bladder are unremarkable. Stomach/Bowel: Evaluation of the bowel is limited without oral contrast. Postsurgical changes are seen from prior Whipple procedure, with distal gastrectomy and duodenectomy. No evidence of bowel obstruction or ileus. Jejunal wall thickening is seen within the right mid abdomen, reference image 141/6. There is moderate gaseous distention of the stomach, with mural thickening of the gastric antrum. Free gas within the upper abdomen is consistent with perforated viscus or anastomotic breakdown at site of prior Whipple procedure. Surgical consultation is recommended. Lymphatic: No pathologic adenopathy within the abdomen or pelvis. Reproductive: Prostate is unremarkable. Other: Free fluid is seen primarily within the left upper quadrant and lower pelvis. Pneumoperitoneum within the upper abdomen as above. No abdominal wall hernia. Musculoskeletal: No acute displaced fractures. Bilateral femoral head avascular necrosis again noted. Reconstructed images demonstrate no additional findings. Review of the MIP images confirms the above findings. IMPRESSION: 1. Pneumoperitoneum, consistent with perforated viscus or anastomotic breakdown at site of prior Whipple procedure. The exact source of the free gas is not visualized on CT, though wall thickening of the gastric antrum and right upper quadrant small bowel is noted and could reflect site of perforation. Surgical consultation recommended. 2. No evidence of thoracoabdominal aortic aneurysm or dissection. 3. Free fluid within the left upper quadrant and lower pelvis. 4. Stable obstructing 5 mm proximal right ureteral calculus with mild right-sided hydronephrosis. 5. Nonobstructing 3 mm left renal calculus. 6. Aortic Atherosclerosis (ICD10-I70.0) and Emphysema (ICD10-J43.9). Critical Value/emergent results were called by telephone at the time of interpretation on 01/02/2022 at 10:29  pm to provider JOSHUA LONG , who  verbally acknowledged these results. Electronically Signed   By: Randa Ngo M.D.   On: 01/02/2022 22:29    Anti-infectives: Anti-infectives (From admission, onward)    Start     Dose/Rate Route Frequency Ordered Stop   01/03/22 0800  piperacillin-tazobactam (ZOSYN) IVPB 3.375 g        3.375 g 12.5 mL/hr over 240 Minutes Intravenous Every 8 hours 01/03/22 0240     01/02/22 2245  piperacillin-tazobactam (ZOSYN) IVPB 3.375 g        3.375 g 100 mL/hr over 30 Minutes Intravenous  Once 01/02/22 2230 01/02/22 2330        Assessment/Plan Hx of Whipple for cholangiocarcinoma in 2022 Perforated ulcer at G-J anastomosis  POD0 S/p exploratory laparotomy with graham patch 11/15 Dr. Georgette Dover - continue BID protonix and Zosyn - continue NGT to LIWS and strict NPO - UGI POD3-5 vs clears and monitor drain pending discussion with surgeon - continue foley today, will remove on POD1 - continue PCA for pain control today - BID wet to dry dressing to midline starting tomorrow AM  FEN: NPO, IVF, NGT to LIWS VTE: LMWH ID: Zosyn 11/14>>  Tobacco abuse - smokes 1.5 PPD, advised cessation  COPD HTN T2DM OSA Anxiety  LOS: 0 days      Norm Parcel, [facility] : [facility] 01/03/2022, 10:50 AM Please see Amion for pager number during day hours 7:00am-4:30pm

## 2022-01-03 NOTE — Op Note (Addendum)
Preop diagnosis: Pneumoperitoneum Postop diagnosis: Same Procedure performed: Exploratory laparotomy, [name] [name] patch repair of perforated gastrojejunal ulcer Surgeon:[name] [name] Retta Pitcher Assistant: Dr. 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 Sophronia Simas.  Subsequently he completed Xeloda and radiation.  He also had a right inguinal hernia repair with mesh 10/02/21 by Dr. Freida Busman.  He presents now with acute onset of pain in the lower abdomen that then radiated across his entire abdomen.  He has had chronic diarrhea since radiation and continues to have bowel movements.  CT scan showed free air and free fluid in the LUQ and pelvis.  We recommended immediate exploration.  Description of procedure: The patient is brought to the operating room and placed in the supine position on the operating room table.  After an adequate level of general anesthesia was obtained, a Foley catheter was placed under sterile technique.  The patient's abdomen was shaved, prepped with ChloraPrep and draped in sterile fashion.  A timeout was taken to ensure the proper patient and proper procedure.  We made a vertical midline incision beginning just above the umbilicus down to the mid lower abdomen.  We dissected down to the linea alba with cautery.  We entered the peritoneal cavity sharply.  There is some bilious ascites noted throughout the lower abdomen.  We extended our incision slightly superiorly.  There were minimal adhesions to the anterior abdominal wall.  We examined the small bowel in the pelvis.  All of this appeared to be normal.  We evacuated all of the bilious drainage from the pelvis.  The cecum was identified and appeared normal.  We began examining the bowel more proximally in the right side.  We identified the gastrojejunal anastomosis.  There is a 5 mm perforation in the anterior surface of the  gastrojejunal anastomosis.  No other perforation was noted.  There is some fibrinous exudate around this area.  We identified some omentum attached to the transverse colon.  We used cautery to create a tongue of omentum.  We used this as a Psychologist, prison and probation services over the perforation.  We placed 3 sutures of 3-0 silk.  The tails of the sutures were used to tie down the omental [place] patch over the perforation.  There is no sign of leak at this point.  A nasogastric tube was passed into the stomach and was palpated proximal to the GJ anastomosis.  We did not identify any other area of perforation.  No nodules were palpated over the liver or on the peritoneal surface.  There are some omental adhesions in the upper abdomen that were left alone.  We then irrigated the entire abdomen thoroughly with saline until the irrigant was clear.  A 19 French drain was brought in through a stab incision on the right side.  This was placed adjacent to the anastomosis and then directed up under the edge of the liver.  This was secured with 2-0 Ethilon.  We inspected for hemostasis.  The fascia was reapproximated with double-stranded #1 PDS suture.  Subcutaneous tissues were inspected for hemostasis and then packed with saline moistened Kerlix.  Dry ABD dressings were placed.  The patient was then extubated and brought to the recovery room in stable condition.  All sponge, instrument, and needle counts are correct.   Upon entering the abdomen (organ space), I encountered bile and intestinal contents in the abdominal cavity.  CASE DATA:  Type of patient?: LDOW CASE (Surgical Hospitalist [facility] Inpatient)  Status of Case? EMERGENT Add On  Infection Present At Time Of Surgery (PATOS)?   Bile and intestinal contents         Wilmon Arms. Corliss Skains, MD, [facility]  General Surgery   01/03/2022 1:19 AM

## 2022-01-03 NOTE — Progress Notes (Signed)
Called by RN regarding placing Pt on CPAP qhs.  Pt currently with large bore naso-gastric tube in place.  Pt unable to wear CPAP while NG tube is in place due to inability to maintain proper seal. I discussed this with the RN and advised her to increase the patient's O2 as needed and elevate the patient's head-of-bed.

## 2022-01-04 ENCOUNTER — Ambulatory Visit: Payer: Federal, State, Local not specified - PPO

## 2022-01-04 ENCOUNTER — Encounter (HOSPITAL_COMMUNITY): Payer: Self-pay | Admitting: Surgery

## 2022-01-04 ENCOUNTER — Encounter: Payer: Federal, State, Local not specified - PPO | Admitting: Physician Assistant

## 2022-01-04 ENCOUNTER — Encounter: Payer: Self-pay | Admitting: Hematology

## 2022-01-04 ENCOUNTER — Other Ambulatory Visit: Payer: Federal, State, Local not specified - PPO

## 2022-01-04 LAB — BASIC METABOLIC PANEL
Anion gap: 6 (ref 5–15)
BUN: 14 mg/dL (ref 8–23)
CO2: 29 mmol/L (ref 22–32)
Calcium: 8.4 mg/dL — ABNORMAL LOW (ref 8.9–10.3)
Chloride: 103 mmol/L (ref 98–111)
Creatinine, Ser: 0.7 mg/dL (ref 0.61–1.24)
GFR, Estimated: >60 mL/min (ref >60)
Glucose, Bld: 94 mg/dL (ref 70–99)
Potassium: 3.7 mmol/L (ref 3.5–5.1)
Sodium: 138 mmol/L (ref 135–145)

## 2022-01-04 LAB — URINE CULTURE: Culture: <10000 — AB

## 2022-01-04 LAB — CBC
HCT: 41.9 % (ref 39.0–52.0)
Hemoglobin: 13.8 g/dL (ref 13.0–17.0)
MCH: 34.2 pg — ABNORMAL HIGH (ref 26.0–34.0)
MCHC: 32.9 g/dL (ref 30.0–36.0)
MCV: 103.7 fL — ABNORMAL HIGH (ref 80.0–100.0)
Platelets: 164 10*3/uL (ref 150–400)
RBC: 4.04 MIL/uL — ABNORMAL LOW (ref 4.22–5.81)
RDW: 13.4 % (ref 11.5–15.5)
WBC: 9 10*3/uL (ref 4.0–10.5)
nRBC: 0 % (ref 0.0–0.2)

## 2022-01-04 MED ORDER — PANTOPRAZOLE SODIUM 40 MG IV SOLR
40.0000 mg | Freq: Two times a day (BID) | INTRAVENOUS | Status: DC
  Administered 2022-01-04 – 2022-01-07 (×7): 40 mg via INTRAVENOUS
  Filled 2022-01-04 (×7): qty 10

## 2022-01-04 MED ORDER — METHOCARBAMOL 1000 MG/10ML IJ SOLN: 500.0000 mg | Freq: Four times a day (QID) | INTRAVENOUS | Status: DC | PRN

## 2022-01-04 MED ORDER — ACETAMINOPHEN 10 MG/ML IV SOLN
1000.0000 mg | Freq: Three times a day (TID) | INTRAVENOUS | Status: AC
  Administered 2022-01-04 (×2): 1000 mg via INTRAVENOUS
  Filled 2022-01-04 (×2): qty 100

## 2022-01-04 NOTE — Progress Notes (Signed)
PT Cancellation Note  Patient Details Name: [name] [name] MRN: [id number] DOB: [birth year]   Cancelled Treatment:    Reason Eval/Treat Not Completed: PT screened, no needs identified, will sign off. Pt reports ambulating with nursing staff twice yesterday and twice today. Pt just returned from ambulating, politely declines PT eval, reports he will be ambulating with spouse at 21 and again before bed ~8ish. Pt reports not needing any DME at home. RN confirms pt has been ambulating. Will sign off, please re-consult if needs arise.    Talbot Grumbling PT, DPT 01/04/22, 3:04 PM

## 2022-01-04 NOTE — Progress Notes (Signed)
Progress Note  1 Day Post-Op  Subjective: PCA discontinued yesterday, pain well-controlled. Out of bed to chair yesterday but did not ambulate.  Objective: Vital signs in last 24 hours: Temp:  [98.5 F (36.9 C)-99.1 F (37.3 C)] 98.9 F (37.2 C) (11/16 0404) Pulse Rate:  [68-80] 68 (11/16 0404) Resp:  [10-18] 18 (11/16 0404) BP: (140-168)/(86-96) 150/90 (11/16 0404) SpO2:  [93 %-100 %] 95 % (11/16 0404) FiO2 (%):  [0 %] 0 % (11/15 1948)    Intake/Output from previous day: 11/15 0701 - 11/16 0700 In: 2576.4 [I.V.:2424.3; IV Piggyback:152.2] Out: 3700 [Urine:2700; Emesis/NG output:550; Drains:450] Intake/Output this shift: No intake/output data recorded.  PE: General: pleasant, WD, WN male who is laying in bed in NAD Lungs:  Respiratory effort nonlabored Abd: soft, nondistended, mild distention, NGT with bilious drainage, JP with SS fluid, midline wound open at the skin, wound bed is clean and dry. MS: all 4 extremities are symmetrical with no cyanosis, clubbing, or edema. Skin: warm and dry with no masses, lesions, or rashes   Lab Results:  Recent Labs    01/03/22 0746 01/04/22 0415  WBC 11.5* 9.0  HGB 15.0 13.8  HCT 43.7 41.9  PLT 183 164   BMET Recent Labs    01/03/22 0746 01/04/22 0415  NA 136 138  K 3.4* 3.7  CL 102 103  CO2 26 29  GLUCOSE 111* 94  BUN 9 14  CREATININE 0.58* 0.70  CALCIUM 8.2* 8.4*   PT/INR No results for input(s): "LABPROT", "INR" in the last 72 hours. CMP     Component Value Date/Time   NA 138 01/04/2022 0415   NA 140 03/28/2020 0000   K 3.7 01/04/2022 0415   CL 103 01/04/2022 0415   CO2 29 01/04/2022 0415   GLUCOSE 94 01/04/2022 0415   BUN 14 01/04/2022 0415   BUN 9 03/28/2020 0000   CREATININE 0.70 01/04/2022 0415   CREATININE 0.60 (L) 12/27/2021 1020   CREATININE 0.73 10/30/2013 1606   CALCIUM 8.4 (L) 01/04/2022 0415   PROT 6.3 (L) 01/03/2022 0746   ALBUMIN 3.1 (L) 01/03/2022 0746   AST 65 (H) 01/03/2022 0746    AST 111 (H) 12/27/2021 1020   ALT 98 (H) 01/03/2022 0746   ALT 111 (H) 12/27/2021 1020   ALKPHOS 622 (H) 01/03/2022 0746   BILITOT 0.8 01/03/2022 0746   BILITOT 0.6 12/27/2021 1020   GFRNONAA >60 01/04/2022 0415   GFRNONAA >60 12/27/2021 1020   GFRNONAA >89 10/30/2013 1606   GFRAA 108 03/28/2020 0000   GFRAA >89 10/30/2013 1606   Lipase     Component Value Date/Time   LIPASE 22 01/02/2022 2058       Studies/Results: CT Angio Chest/Abd/Pel for Dissection W and/or Wo Contrast  Result Date: 01/02/2022 CLINICAL DATA:  History of cholangiocarcinoma status post Whipple procedure, sudden onset lower abdominal pain radiating to upper abdomen, bilateral shoulder pain, near syncope EXAM: CT ANGIOGRAPHY CHEST, ABDOMEN AND PELVIS TECHNIQUE: Non-contrast CT of the chest was initially obtained. Multidetector CT imaging through the chest, abdomen and pelvis was performed using the standard protocol during bolus administration of intravenous contrast. Multiplanar reconstructed images and MIPs were obtained and reviewed to evaluate the vascular anatomy. RADIATION DOSE REDUCTION: This exam was performed according to the departmental dose-optimization program which includes automated exposure control, adjustment of the mA and/or kV according to patient size and/or use of iterative reconstruction technique. CONTRAST:  1107m OMNIPAQUE IOHEXOL 350 MG/ML SOLN COMPARISON:  11/21/2021, 08/07/2021 FINDINGS: CTA  CHEST FINDINGS Cardiovascular: No evidence of thoracic aortic aneurysm or dissection. Stable atherosclerosis of the aortic arch. The heart is unremarkable without pericardial effusion. No filling defects or pulmonary emboli. Mediastinum/Nodes: No enlarged mediastinal, hilar, or axillary lymph nodes. Thyroid gland, trachea, and esophagus demonstrate no significant findings. Lungs/Pleura: Stable emphysema and bibasilar fibrosis. No acute airspace disease, effusion, or pneumothorax. Central airways are patent.  Musculoskeletal: No acute or destructive bony lesions. Reconstructed images demonstrate no additional findings. Review of the MIP images confirms the above findings. CTA ABDOMEN AND PELVIS FINDINGS VASCULAR Aorta: Normal caliber aorta without aneurysm, dissection, vasculitis or significant stenosis. Diffuse atherosclerosis. Celiac: Patent without evidence of aneurysm, dissection, vasculitis or significant stenosis. SMA: Patent without evidence of aneurysm, dissection, vasculitis or significant stenosis. Anatomic variant with replaced right hepatic artery off the SMA. Renals: Both renal arteries are patent without evidence of aneurysm, dissection, vasculitis, fibromuscular dysplasia or significant stenosis. Mild atherosclerosis at the origin of the right renal artery. IMA: Patent without evidence of aneurysm, dissection, vasculitis or significant stenosis. Inflow: Patent without evidence of aneurysm, dissection, vasculitis or significant stenosis. Diffuse atherosclerosis. Veins: No obvious venous abnormality within the limitations of this arterial phase study. Review of the MIP images confirms the above findings. NON-VASCULAR Hepatobiliary: Stable pneumobilia related to prior cholecystectomy and choledocho jejunostomy. The liver is grossly unremarkable with no focal abnormality on this arterial phase exam. Pancreas: Prior resection of the pancreatic head. Remainder of the pancreatic tissue is somewhat atrophic. No inflammatory changes or pancreatic duct dilation. Spleen: Normal in size without focal abnormality. Adrenals/Urinary Tract: Stable obstructing 5 mm proximal right ureteral calculus, with mild right-sided hydronephrosis again noted. There is a 3 mm nonobstructing left renal calculus. No left-sided obstruction. The adrenals and bladder are unremarkable. Stomach/Bowel: Evaluation of the bowel is limited without oral contrast. Postsurgical changes are seen from prior Whipple procedure, with distal gastrectomy  and duodenectomy. No evidence of bowel obstruction or ileus. Jejunal wall thickening is seen within the right mid abdomen, reference image 141/6. There is moderate gaseous distention of the stomach, with mural thickening of the gastric antrum. Free gas within the upper abdomen is consistent with perforated viscus or anastomotic breakdown at site of prior Whipple procedure. Surgical consultation is recommended. Lymphatic: No pathologic adenopathy within the abdomen or pelvis. Reproductive: Prostate is unremarkable. Other: Free fluid is seen primarily within the left upper quadrant and lower pelvis. Pneumoperitoneum within the upper abdomen as above. No abdominal wall hernia. Musculoskeletal: No acute displaced fractures. Bilateral femoral head avascular necrosis again noted. Reconstructed images demonstrate no additional findings. Review of the MIP images confirms the above findings. IMPRESSION: 1. Pneumoperitoneum, consistent with perforated viscus or anastomotic breakdown at site of prior Whipple procedure. The exact source of the free gas is not visualized on CT, though wall thickening of the gastric antrum and right upper quadrant small bowel is noted and could reflect site of perforation. Surgical consultation recommended. 2. No evidence of thoracoabdominal aortic aneurysm or dissection. 3. Free fluid within the left upper quadrant and lower pelvis. 4. Stable obstructing 5 mm proximal right ureteral calculus with mild right-sided hydronephrosis. 5. Nonobstructing 3 mm left renal calculus. 6. Aortic Atherosclerosis (ICD10-I70.0) and Emphysema (ICD10-J43.9). Critical Value/emergent results were called by telephone at the time of interpretation on 01/02/2022 at 10:29 pm to provider JOSHUA LONG , who verbally acknowledged these results. Electronically Signed   By: Randa Ngo M.D.   On: 01/02/2022 22:29    Anti-infectives: Anti-infectives (From admission, onward)    Start  Dose/Rate Route Frequency  Ordered Stop   01/03/22 0800  piperacillin-tazobactam (ZOSYN) IVPB 3.375 g        3.375 g 12.5 mL/hr over 240 Minutes Intravenous Every 8 hours 01/03/22 0240     01/02/22 2245  piperacillin-tazobactam (ZOSYN) IVPB 3.375 g        3.375 g 100 mL/hr over 30 Minutes Intravenous  Once 01/02/22 2230 01/02/22 2330        Assessment/Plan Hx of Whipple for cholangiocarcinoma in 2022 Perforated ulcer at G-J anastomosis  POD1 S/p exploratory laparotomy with graham patch 11/15 Dr. Georgette Dover - Continue BID protonix - Zosyn for 72 hours postop - continue NGT to LIWS and strict NPO - UGI POD3 - Foley out - Ambulate TID - Pain control: scheduled tylenol, prn robaxin and dilaudid - BID wet-to-dry dressing to midline  FEN: NPO, IVF, NGT to LIWS VTE: LMWH ID: Zosyn 11/14>>  Tobacco abuse - smokes 1.5 PPD, advised cessation  COPD HTN T2DM OSA Anxiety   LOS: 1 day     [name] [name], [facility] 01/04/2022, 8:22 AM Please see Amion for pager number during day hours 7:00am-4:30pm

## 2022-01-05 LAB — BASIC METABOLIC PANEL
Anion gap: 9 (ref 5–15)
BUN: 17 mg/dL (ref 8–23)
CO2: 27 mmol/L (ref 22–32)
Calcium: 8.8 mg/dL — ABNORMAL LOW (ref 8.9–10.3)
Chloride: 103 mmol/L (ref 98–111)
Creatinine, Ser: 0.54 mg/dL — ABNORMAL LOW (ref 0.61–1.24)
GFR, Estimated: >60 mL/min (ref >60)
Glucose, Bld: 73 mg/dL (ref 70–99)
Potassium: 3.3 mmol/L — ABNORMAL LOW (ref 3.5–5.1)
Sodium: 139 mmol/L (ref 135–145)

## 2022-01-05 LAB — MAGNESIUM: Magnesium: 2.1 mg/dL (ref 1.7–2.4)

## 2022-01-05 LAB — CBC
HCT: 40.7 % (ref 39.0–52.0)
Hemoglobin: 13.3 g/dL (ref 13.0–17.0)
MCH: 33.9 pg (ref 26.0–34.0)
MCHC: 32.7 g/dL (ref 30.0–36.0)
MCV: 103.8 fL — ABNORMAL HIGH (ref 80.0–100.0)
Platelets: 185 10*3/uL (ref 150–400)
RBC: 3.92 MIL/uL — ABNORMAL LOW (ref 4.22–5.81)
RDW: 13 % (ref 11.5–15.5)
WBC: 8.7 10*3/uL (ref 4.0–10.5)
nRBC: 0 % (ref 0.0–0.2)

## 2022-01-05 MED ORDER — POTASSIUM CHLORIDE 10 MEQ/100ML IV SOLN
INTRAVENOUS | Status: AC
  Administered 2022-01-05: 10 meq via INTRAVENOUS
  Filled 2022-01-05: qty 100

## 2022-01-05 MED ORDER — ACETAMINOPHEN 10 MG/ML IV SOLN
1000.0000 mg | Freq: Three times a day (TID) | INTRAVENOUS | Status: AC
  Administered 2022-01-05 (×3): 1000 mg via INTRAVENOUS
  Filled 2022-01-05 (×3): qty 100

## 2022-01-05 MED ORDER — POTASSIUM CHLORIDE 10 MEQ/100ML IV SOLN
10.0000 meq | INTRAVENOUS | Status: AC
  Administered 2022-01-05 (×3): 10 meq via INTRAVENOUS
  Filled 2022-01-05 (×3): qty 100

## 2022-01-05 MED ORDER — SILVER NITRATE-POT NITRATE 75-25 % EX MISC
10.0000 | CUTANEOUS | Status: DC | PRN
  Filled 2022-01-05: qty 10

## 2022-01-05 MED ORDER — DIPHENHYDRAMINE HCL 50 MG/ML IJ SOLN
25.0000 mg | Freq: Every evening | INTRAMUSCULAR | Status: DC | PRN
  Administered 2022-01-07: 25 mg via INTRAVENOUS
  Filled 2022-01-05: qty 1

## 2022-01-05 NOTE — Discharge Instructions (Signed)
Wet to Dry WOUND CARE: - Change dressing twice daily - Supplies: sterile saline, kerlex, scissors, ABD pads, tape  Remove dressing and all packing carefully, moistening with sterile saline as needed to avoid packing/internal dressing sticking to the wound. 2.   Clean edges of skin around the wound with water/gauze, making sure there is no tape debris or leakage left on skin that could cause skin irritation or breakdown. 3.   Dampen and clean kerlex with sterile saline and pack wound from wound base to skin level, making sure to take note of any possible areas of wound tracking, tunneling and packing appropriately. Wound can be packed loosely. Trim kerlex to size if a whole kerlex is not required. 4.   Cover wound with a dry ABD pad and secure with tape.  5.   Write the date/time on the dry dressing/tape to better track when the last dressing change occurred. - apply any skin protectant/powder if recommended by clinician to protect skin/skin folds. - change dressing as needed if leakage occurs, wound gets contaminated, or patient requests to shower. - You may shower daily with wound open and following the shower the wound should be dried and a clean dressing placed.  - Medical grade tape as well as packing supplies can be found at [employer] on [address] or [employer] on [address]. The remaining supplies can be found at your local drug store, [employer] etc.  [facility], [place] [phone]  OPEN ABDOMINAL SURGERY: POST OP INSTRUCTIONS  Always review your discharge instruction sheet given to you by the facility where your surgery was performed.  IF YOU HAVE DISABILITY OR FAMILY LEAVE FORMS, YOU MUST BRING THEM TO THE OFFICE FOR PROCESSING.  PLEASE DO NOT GIVE THEM TO YOUR DOCTOR.  A prescription for pain medication may be given to you upon discharge.  Take your pain medication as prescribed, if needed.  If narcotic pain medicine is not needed,  then you may take acetaminophen (Tylenol) or ibuprofen (Advil) as needed. Take your usually prescribed medications unless otherwise directed. If you need a refill on your pain medication, please contact your pharmacy. They will contact our office to request authorization.  Prescriptions will not be filled after 5pm or on week-ends. You should follow a light diet the first few days after arrival home, such as soup and crackers, pudding, etc.unless your doctor has advised otherwise. A high-fiber, low fat diet can be resumed as tolerated.   Be sure to include lots of fluids daily. Most patients will experience some swelling and bruising on the chest and neck area.  Ice packs will help.  Swelling and bruising can take several days to resolve Most patients will experience some swelling and bruising in the area of the incision. Ice pack will help. Swelling and bruising can take several days to resolve..  It is common to experience some constipation if taking pain medication after surgery.  Increasing fluid intake and taking a stool softener will usually help or prevent this problem from occurring.  A mild laxative (Milk of Magnesia or Miralax) should be taken according to package directions if there are no bowel movements after 48 hours.  You may have steri-strips (small skin tapes) in place directly over the incision.  These strips should be left on the skin for 7-10 days.  If your surgeon used skin glue on the incision, you may shower in 24 hours.  The glue will flake off over the  next 2-3 weeks.  Any sutures or staples will be removed at the office during your follow-up visit. You may find that a light gauze bandage over your incision may keep your staples from being rubbed or pulled. You may shower and replace the bandage daily. ACTIVITIES:  You may resume regular (light) daily activities beginning the next day--such as daily self-care, walking, climbing stairs--gradually increasing activities as tolerated.   You may have sexual intercourse when it is comfortable.  Refrain from any heavy lifting or straining until approved by your doctor. You may drive when you no longer are taking prescription pain medication, you can comfortably wear a seatbelt, and you can safely maneuver your car and apply brakes Return to Work: ___________________________________ [name] [name] should see your doctor in the office for a follow-up appointment approximately two weeks after your surgery.  Make sure that you call for this appointment within a day or two after you arrive home to insure a convenient appointment time. OTHER INSTRUCTIONS:  _____________________________________________________________ _____________________________________________________________  WHEN TO CALL YOUR DOCTOR: Fever over 101.0 Inability to urinate Nausea and/or vomiting Extreme swelling or bruising Continued bleeding from incision. Increased pain, redness, or drainage from the incision. Difficulty swallowing or breathing Muscle cramping or spasms. Numbness or tingling in hands or feet or around lips.  The clinic staff is available to answer your questions during regular business hours.  Please don't hesitate to call and ask to speak to one of the nurses if you have concerns.  For further questions, please visit [email or web address]

## 2022-01-05 NOTE — Progress Notes (Signed)
Mobility Specialist - Progress Note   01/05/22 0955  Mobility  Activity Ambulated independently in hallway  Level of Assistance Independent  Assistive Device None  Distance Ambulated (ft) 600 ft  Activity Response Tolerated well  Mobility Referral Yes  $Mobility charge 1 Mobility   Pt received in bed and agreeable to mobility. No complaints during mobility. Pt to bed after session with all needs met w/ family in room.  [facility], [facility]

## 2022-01-05 NOTE — Progress Notes (Signed)
Patient pulled his NGT out and is refusing to have a new one placed.

## 2022-01-05 NOTE — Progress Notes (Addendum)
[facility] Progress Note  2 Days Post-Op  Subjective: CC-  Started coughing last night and NG tube came out. Overall doing well. Abdomen sore but pain well controlled. Denies n/v. Passing flatus and had a BM this morning. Ambulated multiple times yesterday. Has had some oozing from incision.  Objective: Vital signs in last 24 hours: Temp:  [97.9 F (36.6 C)-98.9 F (37.2 C)] 98.8 F (37.1 C) (11/17 0539) Pulse Rate:  [65-75] 75 (11/17 0539) Resp:  [16-18] 17 (11/17 0539) BP: (142-146)/(82-89) 145/84 (11/17 0539) SpO2:  [94 %-97 %] 97 % (11/17 0539)    Intake/Output from previous day: 11/16 0701 - 11/17 0700 In: 2404.3 [I.V.:2154.2; IV Piggyback:250.1] Out: 345 [Urine:150; Emesis/NG output:100; Drains:95] Intake/Output this shift: No intake/output data recorded.  PE: Gen:  Alert, NAD, pleasant Abd: soft, ND, appropriately tender over incision, skin edge at proximal 1/3 aspect of incision with some oozing/ hemostasis achieved with pressure and silver nitrate, repacked wound  Lab Results:  Recent Labs    01/04/22 0415 01/05/22 0452  WBC 9.0 8.7  HGB 13.8 13.3  HCT 41.9 40.7  PLT 164 185   BMET Recent Labs    01/04/22 0415 01/05/22 0452  NA 138 139  K 3.7 3.3*  CL 103 103  CO2 29 27  GLUCOSE 94 73  BUN 14 17  CREATININE 0.70 0.54*  CALCIUM 8.4* 8.8*   PT/INR No results for input(s): "LABPROT", "INR" in the last 72 hours. CMP     Component Value Date/Time   NA 139 01/05/2022 0452   NA 140 03/28/2020 0000   K 3.3 (L) 01/05/2022 0452   CL 103 01/05/2022 0452   CO2 27 01/05/2022 0452   GLUCOSE 73 01/05/2022 0452   BUN 17 01/05/2022 0452   BUN 9 03/28/2020 0000   CREATININE 0.54 (L) 01/05/2022 0452   CREATININE 0.60 (L) 12/27/2021 1020   CREATININE 0.73 10/30/2013 1606   CALCIUM 8.8 (L) 01/05/2022 0452   PROT 6.3 (L) 01/03/2022 0746   ALBUMIN 3.1 (L) 01/03/2022 0746   AST 65 (H) 01/03/2022 0746   AST 111 (H) 12/27/2021 1020   ALT 98  (H) 01/03/2022 0746   ALT 111 (H) 12/27/2021 1020   ALKPHOS 622 (H) 01/03/2022 0746   BILITOT 0.8 01/03/2022 0746   BILITOT 0.6 12/27/2021 1020   GFRNONAA >60 01/05/2022 0452   GFRNONAA >60 12/27/2021 1020   GFRNONAA >89 10/30/2013 1606   GFRAA 108 03/28/2020 0000   GFRAA >89 10/30/2013 1606   Lipase     Component Value Date/Time   LIPASE 22 01/02/2022 2058       Studies/Results: No results found.  Anti-infectives: Anti-infectives (From admission, onward)    Start     Dose/Rate Route Frequency Ordered Stop   01/03/22 0800  piperacillin-tazobactam (ZOSYN) IVPB 3.375 g        3.375 g 12.5 mL/hr over 240 Minutes Intravenous Every 8 hours 01/03/22 0240 01/06/22 0843   01/02/22 2245  piperacillin-tazobactam (ZOSYN) IVPB 3.375 g        3.375 g 100 mL/hr over 30 Minutes Intravenous  Once 01/02/22 2230 01/02/22 2330        Assessment/Plan Hx of Whipple for cholangiocarcinoma in 2022 Perforated ulcer at G-J anastomosis  POD#2 S/p exploratory laparotomy with graham patch 11/15 Dr. Georgette Dover - Continue BID protonix - Zosyn for 72 hours postop - ok to leave NG out, keep NPO - UGI POD3 - tomorrow 11/18 - Ambulate TID - Pain control: scheduled tylenol, prn  robaxin and dilaudid - BID wet-to-dry dressing to midline - replace K and check Mag level   FEN: NPO, IVF VTE: LMWH ID: Zosyn 11/14>>   Tobacco abuse - smokes 1.5 PPD, advised cessation  COPD HTN T2DM OSA Anxiety    LOS: 2 days    Wellington Hampshire, [facility] 01/05/2022, 9:11 AM Please see Amion for pager number during day hours 7:00am-4:30pm

## 2022-01-06 ENCOUNTER — Inpatient Hospital Stay (HOSPITAL_COMMUNITY)

## 2022-01-06 LAB — BASIC METABOLIC PANEL
Anion gap: 11 (ref 5–15)
BUN: 16 mg/dL (ref 8–23)
CO2: 24 mmol/L (ref 22–32)
Calcium: 8.5 mg/dL — ABNORMAL LOW (ref 8.9–10.3)
Chloride: 104 mmol/L (ref 98–111)
Creatinine, Ser: 0.65 mg/dL (ref 0.61–1.24)
GFR, Estimated: >60 mL/min (ref >60)
Glucose, Bld: 56 mg/dL — ABNORMAL LOW (ref 70–99)
Potassium: 3.5 mmol/L (ref 3.5–5.1)
Sodium: 139 mmol/L (ref 135–145)

## 2022-01-06 LAB — CBC
HCT: 39.4 % (ref 39.0–52.0)
Hemoglobin: 13.1 g/dL (ref 13.0–17.0)
MCH: 33.9 pg (ref 26.0–34.0)
MCHC: 33.2 g/dL (ref 30.0–36.0)
MCV: 101.8 fL — ABNORMAL HIGH (ref 80.0–100.0)
Platelets: 201 10*3/uL (ref 150–400)
RBC: 3.87 MIL/uL — ABNORMAL LOW (ref 4.22–5.81)
RDW: 12.5 % (ref 11.5–15.5)
WBC: 8.8 10*3/uL (ref 4.0–10.5)
nRBC: 0 % (ref 0.0–0.2)

## 2022-01-06 MED ORDER — IOHEXOL 300 MG/ML  SOLN
100.0000 mL | Freq: Once | INTRAMUSCULAR | Status: AC | PRN
  Administered 2022-01-06: 100 mL via ORAL

## 2022-01-06 NOTE — TOC Transition Note (Signed)
Transition of Care [facility]) - CM/SW Discharge Note   Patient Details  Name: [name] [name] MRN: [id number] Date of Birth: April 05, 1955  Transition of Care [facility]) CM/SW Contact:  Lennart Pall, LCSW Phone Number: 01/06/2022, 3:21 PM   Clinical Narrative:    Met with pt today to review dc needs.  Aware order placed for [facility] to oversee wound care management and he is agreeable and no agency preference.  Order placed/ accepted with [facility].  Have alerted pt and RN that pt and wife do need to have education on wound care as they will not have daily visits - all aware.  Pt hopeful he will be cleared for dc by tomorrow.  HH info placed on AVS.   Final next level of care: [place] Barriers to Discharge: No Barriers Identified   Patient Goals and CMS Choice Patient states their goals for this hospitalization and ongoing recovery are:: hopes to return home tomorrow if cleared      Discharge Placement                       Discharge Plan and Services                DME Arranged: N/A DME Agency: NA       HH Arranged: RN [place] Agency: [place] Date [place]: 01/06/22 Time [place]: 1520 Representative spoke with at [place]: [place] Determinants of Health ([place]) Interventions Food Insecurity Interventions: Patient Refused Housing Interventions: Patient Refused Transportation Interventions: Intervention Not Indicated, Patient Refused Utilities Interventions: Patient Refused   Readmission Risk Interventions    01/06/2022    3:20 PM  Readmission Risk Prevention Plan  Transportation Screening Complete  PCP or Specialist Appt within 5-7 Days Complete  Home Care Screening Complete  Medication Review (RN CM) Complete

## 2022-01-06 NOTE — Progress Notes (Signed)
Patient ID: [name] [name], male   DOB: [birth year], 66 y.o.   MRN: [id number]  UGI negative for leak Start clear liquids advance as tolerated  Possible discharge tomorrow.  Imogene Burn. Georgette Dover, MD, [facility]  General Surgery   01/06/2022 9:51 AM

## 2022-01-06 NOTE — Progress Notes (Signed)
Pt refused cpap tonight. 

## 2022-01-06 NOTE — Progress Notes (Signed)
3 Days Post-Op   Subjective/Chief Complaint: Patient ambulating well Several large bowel movements Minimal pain About to head downstairs for UGI   Objective: Vital signs in last 24 hours: Temp:  [97.5 F (36.4 C)-98.4 F (36.9 C)] 98.4 F (36.9 C) (11/18 0551) Pulse Rate:  [60-82] 61 (11/18 0551) Resp:  [18] 18 (11/18 0551) BP: (131-143)/(79-101) 143/79 (11/18 0551) SpO2:  [96 %-98 %] 96 % (11/18 0551) Last BM Date : 01/05/22  Intake/Output from previous day: 11/17 0701 - 11/18 0700 In: 2529.9 [I.V.:1769.9; IV Piggyback:760] Out: 820.3 [Urine:800; Drains:20.3] Intake/Output this shift: No intake/output data recorded.  Gen:  Alert, NAD, pleasant Abd: soft, ND, appropriately tender over incision, wound shows no active bleeding; clean  Lab Results:  Recent Labs    01/05/22 0452 01/06/22 0536  WBC 8.7 8.8  HGB 13.3 13.1  HCT 40.7 39.4  PLT 185 201   BMET Recent Labs    01/05/22 0452 01/06/22 0536  NA 139 139  K 3.3* 3.5  CL 103 104  CO2 27 24  GLUCOSE 73 56*  BUN 17 16  CREATININE 0.54* 0.65  CALCIUM 8.8* 8.5*   PT/INR No results for input(s): "LABPROT", "INR" in the last 72 hours. ABG No results for input(s): "PHART", "HCO3" in the last 72 hours.  Invalid input(s): "PCO2", "PO2"  Studies/Results: No results found.  Anti-infectives: Anti-infectives (From admission, onward)    Start     Dose/Rate Route Frequency Ordered Stop   01/03/22 0800  piperacillin-tazobactam (ZOSYN) IVPB 3.375 g        3.375 g 12.5 mL/hr over 240 Minutes Intravenous Every 8 hours 01/03/22 0240 01/06/22 2359   01/02/22 2245  piperacillin-tazobactam (ZOSYN) IVPB 3.375 g        3.375 g 100 mL/hr over 30 Minutes Intravenous  Once 01/02/22 2230 01/02/22 2330       Assessment/Plan: Hx of Whipple for cholangiocarcinoma in 2022 Perforated ulcer at G-J anastomosis  POD#3 S/p exploratory laparotomy with graham patch 11/15 Dr. Georgette Dover - Continue BID protonix - Zosyn for 72  hours postop - ok to leave NG out, keep NPO - UGI POD3 - today - if negative for leak, will start diet - Ambulate TID - Pain control: scheduled tylenol, prn robaxin and dilaudid - BID wet-to-dry dressing to midline - replace K and check Mag level   FEN: NPO, IVF VTE: LMWH ID: Zosyn 11/14>>   Tobacco abuse - smokes 1.5 PPD, advised cessation  COPD HTN T2DM OSA Anxiety    LOS: 3 days    [name] [name] 01/06/2022

## 2022-01-07 NOTE — Progress Notes (Signed)
Assessment unchanged. Pt verbalized understanding of dc instructions including dressing changes, medications, follow up care, and when to call the doctor. Pt stated "my wife is going to change dressing and she feels comfortable." Home Health is scheduled to follow. Discharged via wc to front entrance accompanied by RN.

## 2022-01-07 NOTE — Plan of Care (Signed)

## 2022-01-07 NOTE — Discharge Summary (Signed)
Physician Discharge Summary  Patient ID: [name] [name] MRN: [id number] DOB/AGE: 1955/08/16 66 y.o.  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 Michaelle Birks.  Subsequently he completed Xeloda and radiation.  He also had a right inguinal hernia repair with mesh 10/02/21 by Dr. Zenia Resides.  He presents now with acute onset of pain in the lower abdomen that then radiated across his entire abdomen.  He has had chronic diarrhea since radiation and continues to have bowel movements.  CT scan showed free air and free fluid in the LUQ and pelvis.  On 01/03/22, he underwent emergent exploratory laparotomy with Phillip Heal patch repair of perforated anterior G-J ulcer at the anastomosis.  He recovered well and had an upper GI on 11/18 that showed no sign of ongoing leak.  His drain is removed today and his diet has been advanced.  He is having bowel movements and is usually tylenol only for pain.    Significant Diagnostic Studies: CLINICAL DATA:  66 year old male with history of cholangiocarcinoma status post Whipple in 2022 who presented with perforated ulcer at [place] anastomosis, status post Phillip Heal patch on 01/03/2022. Request for water-soluble UGI to rule out postsurgical leak.   EXAM: DG UGI W SINGLE CM   TECHNIQUE: Scout radiograph was obtained. Single contrast examination was performed using 100 mL of Omnipaque 300. This exam was performed by Durenda Guthrie, PA-C, and was supervised and interpreted by Dorise Bullion, MD.   FLUOROSCOPY: Radiation Exposure Index (as provided by the fluoroscopic device): 14.5 mGy Kerma   COMPARISON:  None Available.   FINDINGS: Scout Radiograph:  Nonspecific gas pattern. Surgical clips noted right upper quadrant. Surgical drained terminating right upper quadrant.   Stomach: Water-soluble contrast flowed freely from the esophagus, stomach, and to jejunum. No postsurgical leak noted at the [place] anastomosis.   IMPRESSION: No postsurgical leak.     Electronically Signed   By: Dorise Bullion III M.D.   On: 01/06/2022 09:43  Treatments: surgery: Phillip Heal patch repair of perforated G-J ulcer  Discharge Exam: Blood pressure (!) 143/86, pulse 63, temperature 98.4 F (36.9 C), temperature source Oral, resp. rate 20, height '5\' 10"'$  (1.778 m), weight 64.4 kg, SpO2 95 %. WDWN in NAD Abd - soft, non-distended Drain - removed; site clean Midline wound - beginning to granulate; no active bleeding;fascia intact  Disposition: Discharge disposition: 01-Home or Self Care       Discharge Instructions     Call MD for:  persistant nausea and vomiting   Complete by: As directed    Call MD for:  redness, tenderness, or signs of infection (pain, swelling, redness, odor or green/yellow discharge around incision site)   Complete by: As directed    Call MD for:  severe uncontrolled pain   Complete by: As directed    Call MD for:  temperature >100.4   Complete by: As directed    Diet general   Complete by: As directed    Discharge wound care:   Complete by: As directed    NS wet to dry dressings to midline wound - change daily Small dry dressing to right-side drain site - change daily until healed   Driving Restrictions   Complete by: As directed  Do not drive while taking pain medications   Increase activity slowly   Complete by: As directed    May shower / Bathe   Complete by: As directed    Change dressing after showers      Allergies as of 01/07/2022       Reactions   Quetiapine Other (See Comments)   "Made me sleep TOO MUCH"        Medication List     TAKE these medications    albuterol 108 (90 Base) MCG/ACT  inhaler Commonly known as: VENTOLIN HFA Inhale 1-2 puffs into the lungs every 6 (six) hours as needed for wheezing or shortness of breath.   aspirin EC 81 MG tablet Take 81 mg by mouth in the morning. Swallow whole.   busPIRone 5 MG tablet Commonly known as: BUSPAR Take 5 mg by mouth at bedtime.   diphenoxylate-atropine 2.5-0.025 MG tablet Commonly known as: LOMOTIL Take 1-2 tablets by mouth 4 (four) times daily as needed for diarrhea or loose stools. What changed:  how much to take when to take this   magic mouthwash (multi-ingredient) oral suspension Swish, gargle, and spit 5-10 mls for 1 minute. Repeat every 6 hours as needed. May swallow if throat involved.   MILK THISTLE PO Take 1 capsule by mouth daily with breakfast.   multivitamin with minerals Tabs tablet Take 1 tablet by mouth daily with breakfast.   Pancreaze 4200-14200 units Cpep Generic drug: Pancrelipase (Lip-Prot-Amyl) Take 4,200 units of lipase by mouth 2 (two) times daily.   Pancrelipase (Lip-Prot-Amyl) 4200-14200 units Cpep Take 4,200 units of lipase by mouth 3 times daily with meals, bedtime and 2 AM. Take 2 pills by mouth with Breakfast, Lunch, and Diner (large meals).  Take 1 pill by mouth with snacks (2 snacks/day)   PARoxetine 30 MG tablet Commonly known as: PAXIL Take 60 mg by mouth at bedtime.   PROBIOTIC PO Take 1 capsule by mouth in the morning.   Systane 0.4-0.3 % Gel ophthalmic gel Generic drug: Polyethyl Glycol-Propyl Glycol Place 1 Application into both eyes in the morning.   Systane Complete PF 0.6 % Soln Generic drug: Propylene Glycol (PF) Place 1 drop into both eyes See admin instructions. Instill 1 drop into both eyes 3-4 times a day   tamsulosin 0.4 MG Caps capsule Commonly known as: FLOMAX Take 0.4 mg by mouth daily.   tiaGABine 4 MG tablet Commonly known as: GABITRIL Take 8 mg by mouth at bedtime.               Discharge Care Instructions  (From admission, onward)            Start     Ordered   01/07/22 0000  Discharge wound care:       Comments: NS wet to dry dressings to midline wound - change daily Small dry dressing to right-side drain site - change daily until healed   01/07/22 8416            Follow-up Information     Dwan Bolt, MD. Go on 01/23/2022.   Specialty: General Surgery Why: Your appointment is 12/5 at 10:50am Please arrive 15 minutes early to check in. Contact information: [place] [address] [phone]         Care, [facility] Follow up.   Specialty: [place] Why: to provide home health nursing visits Contact information: [place] [address] [phone]  Signed: Maia Petties 01/07/2022, 8:18 AM

## 2022-01-16 ENCOUNTER — Other Ambulatory Visit (HOSPITAL_COMMUNITY): Payer: Self-pay | Admitting: Surgery

## 2022-01-16 ENCOUNTER — Ambulatory Visit (HOSPITAL_COMMUNITY)
Admission: RE | Admit: 2022-01-16 | Discharge: 2022-01-16 | Disposition: A | Discharge status: Home / Self Care | Payer: Federal, State, Local not specified - PPO | Source: Ambulatory Visit | Attending: Surgery | Admitting: Surgery

## 2022-01-16 DIAGNOSIS — R1031 Right lower quadrant pain: Secondary | ICD-10-CM

## 2022-01-16 MED ORDER — IOHEXOL 300 MG/ML  SOLN
100.0000 mL | Freq: Once | INTRAMUSCULAR | Status: AC | PRN
  Administered 2022-01-16: 100 mL via INTRAVENOUS

## 2022-01-16 MED ORDER — IOHEXOL 9 MG/ML PO SOLN
1000.0000 mL | ORAL | Status: AC
  Administered 2022-01-16: 1000 mL via ORAL

## 2022-01-16 MED ORDER — IOHEXOL 9 MG/ML PO SOLN
ORAL | Status: AC
  Filled 2022-01-16: qty 1000

## 2022-01-16 MED ORDER — IOHEXOL 9 MG/ML PO SOLN: 1000.0000 mL | Freq: Once | ORAL | Status: DC

## 2022-01-18 ENCOUNTER — Ambulatory Visit (HOSPITAL_COMMUNITY)
Admission: RE | Admit: 2022-01-18 | Discharge: 2022-01-18 | Disposition: A | Discharge status: Home / Self Care | Payer: Federal, State, Local not specified - PPO | Source: Ambulatory Visit | Attending: Hematology | Admitting: Hematology

## 2022-01-18 ENCOUNTER — Other Ambulatory Visit: Payer: Self-pay | Admitting: Hematology

## 2022-01-18 ENCOUNTER — Other Ambulatory Visit: Payer: Self-pay

## 2022-01-18 ENCOUNTER — Inpatient Hospital Stay: Payer: Federal, State, Local not specified - PPO

## 2022-01-18 DIAGNOSIS — C221 Intrahepatic bile duct carcinoma: Secondary | ICD-10-CM

## 2022-01-18 DIAGNOSIS — C249 Malignant neoplasm of biliary tract, unspecified: Secondary | ICD-10-CM

## 2022-01-18 DIAGNOSIS — C240 Malignant neoplasm of extrahepatic bile duct: Secondary | ICD-10-CM | POA: Diagnosis not present

## 2022-01-18 LAB — CMP (CANCER CENTER ONLY)
ALT: 51 U/L — ABNORMAL HIGH (ref 0–44)
AST: 70 U/L — ABNORMAL HIGH (ref 15–41)
Albumin: 3.3 g/dL — ABNORMAL LOW (ref 3.5–5.0)
Alkaline Phosphatase: 987 U/L — ABNORMAL HIGH (ref 38–126)
Anion gap: 4 — ABNORMAL LOW (ref 5–15)
BUN: 7 mg/dL — ABNORMAL LOW (ref 8–23)
CO2: 34 mmol/L — ABNORMAL HIGH (ref 22–32)
Calcium: 8.8 mg/dL — ABNORMAL LOW (ref 8.9–10.3)
Chloride: 103 mmol/L (ref 98–111)
Creatinine: 0.51 mg/dL — ABNORMAL LOW (ref 0.61–1.24)
GFR, Estimated: >60 mL/min (ref >60)
Glucose, Bld: 74 mg/dL (ref 70–99)
Potassium: 3.5 mmol/L (ref 3.5–5.1)
Sodium: 141 mmol/L (ref 135–145)
Total Bilirubin: 0.6 mg/dL (ref 0.3–1.2)
Total Protein: 6.6 g/dL (ref 6.5–8.1)

## 2022-01-18 LAB — CBC WITH DIFFERENTIAL (CANCER CENTER ONLY)
Abs Immature Granulocytes: 0.03 10*3/uL (ref 0.00–0.07)
Basophils Absolute: 0 10*3/uL (ref 0.0–0.1)
Basophils Relative: 1 %
Eosinophils Absolute: 0.1 10*3/uL (ref 0.0–0.5)
Eosinophils Relative: 2 %
HCT: 32.3 % — ABNORMAL LOW (ref 39.0–52.0)
Hemoglobin: 11.1 g/dL — ABNORMAL LOW (ref 13.0–17.0)
Immature Granulocytes: 1 %
Lymphocytes Relative: 7 %
Lymphs Abs: 0.5 10*3/uL — ABNORMAL LOW (ref 0.7–4.0)
MCH: 33.8 pg (ref 26.0–34.0)
MCHC: 34.4 g/dL (ref 30.0–36.0)
MCV: 98.5 fL (ref 80.0–100.0)
Monocytes Absolute: 0.4 10*3/uL (ref 0.1–1.0)
Monocytes Relative: 7 %
Neutro Abs: 5.4 10*3/uL (ref 1.7–7.7)
Neutrophils Relative %: 82 %
Platelet Count: 258 10*3/uL (ref 150–400)
RBC: 3.28 MIL/uL — ABNORMAL LOW (ref 4.22–5.81)
RDW: 13.2 % (ref 11.5–15.5)
WBC Count: 6.5 10*3/uL (ref 4.0–10.5)
nRBC: 0 % (ref 0.0–0.2)

## 2022-01-18 LAB — GLUCOSE, CAPILLARY
Glucose-Capillary: 53 mg/dL — ABNORMAL LOW (ref 70–99)
Glucose-Capillary: 88 mg/dL (ref 70–99)

## 2022-01-18 MED ORDER — GADOBUTROL 1 MMOL/ML IV SOLN
6.0000 mL | Freq: Once | INTRAVENOUS | Status: AC | PRN
  Administered 2022-01-18: 6 mL via INTRAVENOUS

## 2022-01-18 MED ORDER — FLUDEOXYGLUCOSE F - 18 (FDG) INJECTION
7.0000 | Freq: Once | INTRAVENOUS | Status: AC
  Administered 2022-01-18: 7 via INTRAVENOUS

## 2022-01-20 LAB — CANCER ANTIGEN 19-9: CA 19-9: 354 U/mL — ABNORMAL HIGH (ref 0–35)

## 2022-01-22 ENCOUNTER — Other Ambulatory Visit: Payer: Federal, State, Local not specified - PPO

## 2022-01-22 ENCOUNTER — Ambulatory Visit (HOSPITAL_COMMUNITY): Admission: RE | Admit: 2022-01-22 | Payer: Federal, State, Local not specified - PPO | Source: Ambulatory Visit

## 2022-01-22 NOTE — Progress Notes (Unsigned)
[name]   [phone] [phone]   Clinic Follow up Note   Patient Care Team: Hoyt Koch, MD as PCP - General (Internal Medicine) Deneise Lever, MD as Consulting Physician (Pulmonary Disease) Dwan Bolt, MD as Consulting Physician (General Surgery) Kyung Rudd, MD as Consulting Physician (Radiation Oncology) [name], MD as Consulting Physician (Hematology) [name], MD as Consulting Physician (Hematology) Carol Ada, MD as Consulting Physician (Gastroenterology) [facility], [facility] as Consulting Physician (Urology)  Date of Service:  01/24/2022  CHIEF COMPLAINT: f/u of  extrahepatic cholangiocarcinoma    CURRENT THERAPY:  Surveillance   ASSESSMENT:  [name] [name] is a 66 y.o. 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  -He received 6 months of adjuvant chemotherapy with Xarelto. -On cancer surveillance now. -His tumor marker CA 19.9 has been trending up. -I reviewed his PET scan from 01/18/3022, which showed multiple right upper quadrant foci of hypermetabolism which are nonspecific in the postoperative period. (2 weeks out from ulcer repair). I discussed with pt and plan to repeat CT in 6-8 weeks   Perforated duodenal ulcer ([name]) -he was recently hospitalized for perforated ulcer at gastrojejunal anastomosis on 01/02/2022, s/p surgery. -He is recovering slowly    Fatigue -possible related to his recent surgery  -I encouraged him to eat well, and stay active.   PLAN: - discuss recent PET Scan and MRI findings -Repeat scan in 2 months. Encourage the pt to stay active and take breaks as well. -lab,f/u in 81month  SUMMARY OF ONCOLOGIC HISTORY: Oncology History Overview Note  Cancer Staging Cholangiocarcinoma ([facility] Staging form: Distal Bile Duct, AJCC 8th Edition - Pathologic stage from 10/31/2020: Stage I (pT1, pN0,  cM0) - Signed by FTruitt Merle MD on 11/22/2020    [place]angiocarcinoma ([place]  09/22/2020 Imaging   UKoreaAbdomen  IMPRESSION: Intrahepatic biliary ductal dilation and dilated proximal common bile duct. Findings are consistent with biliary obstruction, possibly due to a common bile duct mass, incompletely evaluated by ultrasound. Recommend multiphase CT or MRCP for further evaluation.   Mildly distended gallbladder filled with material of varying echogenicity, likely sludge and tiny stones. In the absence of leukocytosis and right upper quadrant pain this is unlikely to represent cholecystitis.   Coarsened liver echogenicity with somewhat nodular left hepatic lobe, compatible with chronic liver disease.   09/22/2020 Imaging   CT AP  IMPRESSION: 1. Question bibasilar pulmonary fibrosis. 2. Homogeneous hyperdensity fills the gallbladder lumen - likely represents gallbladder sludge. Intra and extrahepatic biliary ductal dilatation with no main pancreatic duct dilatation. Findings are consistent with biliary obstruction, possibly due to a common bile duct mass which is incompletely evaluated on this single phase portal venous study. Recommend MRCP for further evaluation. 3. Nonspecific perirectal fat stranding with under distension of the rectum. 4. Aortic Atherosclerosis (ICD10-I70.0).   09/23/2020 Imaging   MRI Abdomen MRCP  IMPRESSION: 1. Obstructing soft tissue mass in the proximal common bile duct at or adjacent to the junction with the cystic duct, associated with moderate to severe intrahepatic biliary ductal dilatation indicating obstruction, as discussed above. 2. Biliary sludge filling the gallbladder lumen.   09/23/2020 Procedure   ERCP, Dr. HBenson [place] Impression: - The major papilla appeared normal. - A single localized biliary stricture was found in the common bile duct. The stricture was malignant appearing. - The left and right hepatic ducts and all intrahepatic branches and  common  bile duct were moderately dilated, with a mass causing an obstruction. - A biliary sphincterotomy was performed. - Cells for cytology obtained in the upper third of the main bile duct. - One plastic biliary stent was placed into the common bile duct.   09/23/2020 Pathology Results   A. BILIARY, BRUSHING:   FINAL MICROSCOPIC DIAGNOSIS:  - Malignant cells consistent with adenocarcinoma    10/09/2020 Initial Diagnosis   Cholangiocarcinoma ([place])   10/10/2020 PET scan   IMPRESSION: 1. No evidence of metastatic disease. Common bile duct mass, better seen and described on MR abdomen 09/23/2020. 2. Common bile duct stent in place with persistent biliary ductal dilatation and associated pneumobilia. 3. Tiny left renal stone. 4. Bladder wall thickening. 5. Aortic atherosclerosis (ICD10-I70.0). Coronary artery calcification. 6.  Emphysema (ICD10-J43.9).   10/10/2020 Imaging   CT AP  IMPRESSION: No suspicious liver lesions. Replaced right hepatic artery arising from the SMA.   Common bile duct tumor appears unchanged compared to prior CT. Common bile duct stent place.   Findings of fibrotic interstitial lung disease in the partially visualized lower chest, could be further evaluated with dedicated high-resolution chest CT.   10/31/2020 Cancer Staging   Staging form: Distal Bile Duct, AJCC 8th Edition - Pathologic stage from 10/31/2020: Stage I (pT1, pN0, cM0) - Signed by [name], MD on 11/22/2020 Stage prefix: Initial diagnosis Total positive nodes: 0 Residual tumor (R): R1 - Microscopic   10/31/2020 Surgery   Whipple Procedure, Dr. Michaelle Birks   10/31/2020 Pathology Results   FINAL MICROSCOPIC DIAGNOSIS:   A. COMMON HEPATIC DUCT MARGIN:  - Adenocarcinoma involving bile duct.   B. NEW COMMON HEPATIC DUCT MARGIN:  - Atypical glands consistent with adenocarcinoma.   C. RIGHT HEPATIC DUCT MARGIN:  - Connective tissue with glands showing cautery artifact.  - No diagnostic  malignancy identified.   D. GALLBLADDER, CHOLECYSTECTOMY:  - Benign gallbladder.  - Benign cystic duct lymph node.   E. LYMPH NODE, STATION 8, EXCISION:  - Two lymph nodes negative for metastatic carcinoma (0/2).   F. WHIPPLE PROCEDURE:  - Ductal adenocarcinoma, 1.6 cm.  - Carcinoma involves ampulla of Vater and periductal connective tissue.  - Perineural invasion.  - Surgical margins negative for carcinoma.  - Six lymph nodes negative for metastatic carcinoma (0/6).  - See oncology table.   G. LYMPH NODE, PORTAL, EXCISION:  - One lymph node negative for metastatic carcinoma (0/1).    10/31/2020 Cancer Staging   Staging form: Distal Bile Duct, AJCC 8th Edition - Pathologic stage from 10/31/2020: Stage I (pT1, pN0, cM0) - Signed by [name], MD on 11/23/2020 Total positive nodes: 0 Residual tumor (R): R1 - Microscopic   05/08/2021 Imaging   EXAM: CT ABDOMEN AND PELVIS WITH CONTRAST  IMPRESSION: 1. No acute findings in the abdomen or pelvis. 2. Status post Whipple procedure. No evidence for local recurrence or metastatic disease. 3. Large stool volume. Imaging features compatible with clinical constipation. 4. Nonobstructing bilateral nephrolithiasis. 5. Aortic Atherosclerosis (ICD10-I70.0).   01/18/2022 Imaging    IMPRESSION: 1. Multiple right upper quadrant foci of hypermetabolism which are nonspecific in the postoperative period. (2 weeks out from ulcer repair). These are most likely related to postoperative low-grade inflammation. However, early peritoneal and nodal metastasis could have a similar appearance. Consider short-term follow-up with pre and post contrast abdominal MRI at 3 months. 2. No extra abdominal metastatic disease identified. 3. Incidental findings, including: Aortic atherosclerosis (ICD10-I70.0), coronary artery atherosclerosis and emphysema (ICD10-J43.9). Right proximal ureteric  calculus. Small volume ascites.      INTERVAL HISTORY:  CODA MATHEY is here for a follow up of extrahepatic  He was last seen by me on 11/23/2021 He presents to the clinic alone.Pt reports of being week and fatigue after work. Pt states his appetite is fine, denied having pain. Pt had fever from an infection.     All other systems were reviewed with the patient and are negative.  MEDICAL HISTORY:  Past Medical History:  Diagnosis Date   Alcoholism [facility])    pt has not drank since 11/2019   Anxiety    Bilateral carotid artery stenosis    Bipolar disorder ([place])    Cancer ([place]) 2022   bile duct   COPD (chronic obstructive pulmonary disease) ([place])    Depression    " post traumatic stress disorder" sees MD in [place] every 3-6 months.   Diabetes mellitus without complication ([place])    type 2   Dysrhythmia    hx. Bundle branch block- saw Dr. Kathlee Nations   GERD (gastroesophageal reflux disease)    Hypertension    Irregular heart rate    RBBB    Sleep apnea    cpap used with nose clip- Dr. Baird Lyons follows   Substance abuse [facility])    "tends to self medicate(Rx. meds or Alcohol) trying to get relief from "PSTD"    SURGICAL HISTORY: Past Surgical History:  Procedure Laterality Date   BILIARY BRUSHING  09/23/2020   Procedure: BILIARY BRUSHING;  Surgeon: Carol Ada, MD;  Location: [facility];  Service: Endoscopy;;   BILIARY STENT PLACEMENT N/A 09/23/2020   Procedure: BILIARY STENT PLACEMENT;  Surgeon: Carol Ada, MD;  Location: [facility];  Service: Endoscopy;  Laterality: N/A;   CHOLECYSTECTOMY     removed during whipple procedure   COLONOSCOPY WITH PROPOFOL N/A 01/19/2014   Procedure: COLONOSCOPY WITH PROPOFOL;  Surgeon: Juanita Craver, MD;  Location: [facility];  Service: Endoscopy;  Laterality: N/A;   ERCP N/A 09/23/2020   Procedure: ENDOSCOPIC RETROGRADE CHOLANGIOPANCREATOGRAPHY (ERCP);  Surgeon: Carol Ada, MD;  Location: [facility];  Service: Endoscopy;  Laterality: N/A;   exploratory laparotomy N/A  01/03/2022   s/p graham patch for perforation at [place] N/A 10/02/2021   Procedure: open right inguinal hernia repair with mesh;  Surgeon: Dwan Bolt, MD;  Location: [facility];  Service: General;  Laterality: N/A;   LAPAROSCOPY N/A 10/31/2020   Procedure: Manfred Shirts LAPAROSCOPY;  Surgeon: Dwan Bolt, MD;  Location: [place] [facility];  Service: General;  Laterality: N/A;  GEN AND TAP BLOCK   LAPAROTOMY N/A 01/03/2022   Procedure: EXPLORATORY LAPAROTOMY, GRAHAM PATCH REPAIR OF PERFORATED ULCER;  Surgeon: Donnie Mesa, MD;  Location: [facility];  Service: General;  Laterality: N/A;   SPHINCTEROTOMY  09/23/2020   Procedure: Joan Mayans;  Surgeon: Carol Ada, MD;  Location: [facility];  Service: Endoscopy;;   WHIPPLE PROCEDURE N/A 10/31/2020   Procedure: WHIPPLE PROCEDURE, INTRAOPERATIVE ULTRASOUND;  Surgeon: Dwan Bolt, MD;  Location: [facility];  Service: General;  Laterality: N/A;   WISDOM TOOTH EXTRACTION     66 years old    I have reviewed the social history and family history with the patient and they are unchanged from previous note.  ALLERGIES:  is allergic to quetiapine.  MEDICATIONS:  Current Outpatient Medications  Medication Sig Dispense Refill   albuterol (VENTOLIN HFA) 108 (90 Base) MCG/ACT inhaler Inhale 1-2 puffs into the lungs every 6 (six) hours as  needed for wheezing or shortness of breath.     aspirin EC 81 MG tablet Take 81 mg by mouth in the morning. Swallow whole.     busPIRone (BUSPAR) 5 MG tablet Take 5 mg by mouth at bedtime.     diphenoxylate-atropine (LOMOTIL) 2.5-0.025 MG tablet Take 1-2 tablets by mouth 4 (four) times daily as needed for diarrhea or loose stools. (Patient taking differently: Take 2 tablets by mouth in the morning and at bedtime.) 30 tablet 1   magic mouthwash (multi-ingredient) oral suspension Swish, gargle, and spit 5-10 mls for 1 minute. Repeat every 6 hours as needed. May swallow if throat involved. (Patient not taking:  Reported on 01/03/2022) 480 mL 1   MILK THISTLE PO Take 1 capsule by mouth daily with breakfast.     Multiple Vitamin (MULTIVITAMIN WITH MINERALS) TABS tablet Take 1 tablet by mouth daily with breakfast.     PANCREAZE 4200-14200 units CPEP Take 4,200 units of lipase by mouth 2 (two) times daily.     Pancrelipase, Lip-Prot-Amyl, 4200-14200 units CPEP Take 4,200 units of lipase by mouth 3 times daily with meals, bedtime and 2 AM. Take 2 pills by mouth with Breakfast, Lunch, and Diner (large meals).  Take 1 pill by mouth with snacks (2 snacks/day) (Patient not taking: Reported on 01/03/2022) 720 capsule 1   PARoxetine (PAXIL) 30 MG tablet Take 60 mg by mouth at bedtime.     Probiotic Product (PROBIOTIC PO) Take 1 capsule by mouth in the morning.     SYSTANE 0.4-0.3 % GEL ophthalmic gel Place 1 Application into both eyes in the morning.     SYSTANE COMPLETE PF 0.6 % SOLN Place 1 drop into both eyes See admin instructions. Instill 1 drop into both eyes 3-4 times a day     tamsulosin (FLOMAX) 0.4 MG CAPS capsule Take 0.4 mg by mouth daily.     tiaGABine (GABITRIL) 4 MG tablet Take 8 mg by mouth at bedtime.     No current facility-administered medications for this visit.    PHYSICAL EXAMINATION: ECOG PERFORMANCE STATUS: 2 - Symptomatic, <50% confined to bed  Vitals:   01/24/22 0858  BP: (!) 153/76  Pulse: 64  Resp: 18  Temp: 98.2 F (36.8 C)   Wt Readings from Last 3 Encounters:  01/24/22 150 lb 12.8 oz (68.4 kg)  01/02/22 142 lb (64.4 kg)  11/23/21 153 lb 9.6 oz (69.7 kg)    GENERAL:alert, no distress and comfortable SKIN: skin color, texture, turgor are normal, no rashes or significant lesions EYES: normal, Conjunctiva are pink and non-injected, sclera clear NECK: supple, thyroid normal size, non-tender, without nodularity LYMPH:  no palpable lymphadenopathy in the cervical, axillary  LUNGS: clear to auscultation and percussion with normal breathing effort HEART: regular rate & rhythm  and no murmurs and no lower extremity edema ABDOMEN:abdomen soft, non-tender and normal bowel sounds Midline open area at the incision .Clean Musculoskeletal:no cyanosis of digits and no clubbing  NEURO: alert & oriented x 3 with fluent speech, no focal motor/sensory deficits  LABORATORY DATA:  I have reviewed the data as listed    Latest Ref Rng & Units 01/18/2022    2:02 PM 01/06/2022    5:36 AM 01/05/2022    4:52 AM  CBC  WBC 4.0 - 10.5 K/uL 6.5  8.8  8.7   Hemoglobin 13.0 - 17.0 g/dL 11.1  13.1  13.3   Hematocrit 39.0 - 52.0 % 32.3  39.4  40.7   Platelets 150 -  400 K/uL 258  201  185         Latest Ref Rng & Units 01/18/2022    2:02 PM 01/06/2022    5:36 AM 01/05/2022    4:52 AM  CMP  Glucose 70 - 99 mg/dL 74  56  73   BUN 8 - 23 mg/dL '7  16  17   '$ Creatinine 0.61 - 1.24 mg/dL 0.51  0.65  0.54   Sodium 135 - 145 mmol/L 141  139  139   Potassium 3.5 - 5.1 mmol/L 3.5  3.5  3.3   Chloride 98 - 111 mmol/L 103  104  103   CO2 22 - 32 mmol/L 34  24  27   Calcium 8.9 - 10.3 mg/dL 8.8  8.5  8.8   Total Protein 6.5 - 8.1 g/dL 6.6     Total Bilirubin 0.3 - 1.2 mg/dL 0.6     Alkaline Phos 38 - 126 U/L 987     AST 15 - 41 U/L 70     ALT 0 - 44 U/L 51         RADIOGRAPHIC STUDIES: I have personally reviewed the radiological images as listed and agreed with the findings in the report. No results found.    No orders of the defined types were placed in this encounter.  All questions were answered. The patient knows to call the clinic with any problems, questions or concerns. No barriers to learning was detected. The total time spent in the appointment was 30 minutes.     [name], MD 01/24/2022   Felicity Coyer, CMA, am acting as scribe for [name], MD.   I have reviewed the above documentation for accuracy and completeness, and I agree with the above.

## 2022-01-23 DIAGNOSIS — K265 Chronic or unspecified duodenal ulcer with perforation: Secondary | ICD-10-CM | POA: Insufficient documentation

## 2022-01-23 NOTE — Assessment & Plan Note (Signed)
-  he was recently hospitalized for perforated ulcer at gastrojejunal anastomosis on 01/02/2022, s/p surgery. -He is recovering slowly

## 2022-01-23 NOTE — Assessment & Plan Note (Signed)
-  pT1cN0M0, stage I  -diagnosed in 09/2020, s/p Whipple surgery on 10/31/2020.  -He received 6 months of adjuvant chemotherapy with Xarelto. -On cancer surveillance now. -His tumor marker CA 19.9 has been trending up. -I reviewed his PET scan from 01/18/3022, which showed multiple right upper quadrant foci of hypermetabolism which are nonspecific in the postoperative period. (2 weeks out from ulcer repair). I discussed with pt and plan to repeat CT in 6-8 weeks

## 2022-01-24 ENCOUNTER — Encounter: Payer: Self-pay | Admitting: Hematology

## 2022-01-24 ENCOUNTER — Other Ambulatory Visit: Payer: Self-pay

## 2022-01-24 ENCOUNTER — Inpatient Hospital Stay: Payer: Federal, State, Local not specified - PPO | Attending: Hematology | Admitting: Hematology

## 2022-01-24 VITALS — BP 153/76 | HR 64 | Temp 98.2°F | Resp 18 | Ht 70.0 in | Wt 150.8 lb

## 2022-01-24 DIAGNOSIS — C240 Malignant neoplasm of extrahepatic bile duct: Secondary | ICD-10-CM | POA: Insufficient documentation

## 2022-01-24 DIAGNOSIS — E119 Type 2 diabetes mellitus without complications: Secondary | ICD-10-CM | POA: Insufficient documentation

## 2022-01-24 DIAGNOSIS — K265 Chronic or unspecified duodenal ulcer with perforation: Secondary | ICD-10-CM | POA: Diagnosis not present

## 2022-01-24 DIAGNOSIS — I10 Essential (primary) hypertension: Secondary | ICD-10-CM | POA: Insufficient documentation

## 2022-01-24 DIAGNOSIS — C221 Intrahepatic bile duct carcinoma: Secondary | ICD-10-CM | POA: Diagnosis not present

## 2022-01-25 ENCOUNTER — Other Ambulatory Visit: Payer: Federal, State, Local not specified - PPO

## 2022-01-25 ENCOUNTER — Ambulatory Visit: Payer: Federal, State, Local not specified - PPO | Admitting: Hematology

## 2022-01-28 ENCOUNTER — Other Ambulatory Visit: Payer: Self-pay | Admitting: Nurse Practitioner

## 2022-01-29 ENCOUNTER — Encounter: Payer: Self-pay | Admitting: Hematology

## 2022-01-31 ENCOUNTER — Other Ambulatory Visit: Payer: Federal, State, Local not specified - PPO

## 2022-01-31 ENCOUNTER — Ambulatory Visit (INDEPENDENT_AMBULATORY_CARE_PROVIDER_SITE_OTHER): Payer: Federal, State, Local not specified - PPO | Admitting: Internal Medicine

## 2022-01-31 ENCOUNTER — Encounter: Payer: Self-pay | Admitting: Internal Medicine

## 2022-01-31 VITALS — BP 140/80 | HR 70 | Temp 98.6°F | Ht 70.0 in | Wt 149.0 lb

## 2022-01-31 DIAGNOSIS — F1021 Alcohol dependence, in remission: Secondary | ICD-10-CM

## 2022-01-31 DIAGNOSIS — H60392 Other infective otitis externa, left ear: Secondary | ICD-10-CM | POA: Diagnosis not present

## 2022-01-31 DIAGNOSIS — C221 Intrahepatic bile duct carcinoma: Secondary | ICD-10-CM

## 2022-01-31 DIAGNOSIS — K265 Chronic or unspecified duodenal ulcer with perforation: Secondary | ICD-10-CM | POA: Diagnosis not present

## 2022-01-31 DIAGNOSIS — Z23 Encounter for immunization: Secondary | ICD-10-CM

## 2022-01-31 MED ORDER — NEOMYCIN-POLYMYXIN-HC 3.5-10000-1 OT SOLN: 3.0000 [drp] | Freq: Three times a day (TID) | OTIC | 0 refills | Status: AC

## 2022-01-31 NOTE — Progress Notes (Signed)
   Subjective:   Patient ID: [name] [name], male    DOB: 1955/04/16, 66 y.o.   MRN: [id number]  HPI The patient is a 66 YO man coming in for ear pain and recent hospital discharge.  Review of Systems  Constitutional:  Positive for activity change and appetite change.  HENT:  Positive for ear pain.   Eyes: Negative.   Respiratory:  Negative for cough, chest tightness and shortness of breath.   Cardiovascular:  Negative for chest pain, palpitations and leg swelling.  Gastrointestinal:  Positive for diarrhea. Negative for abdominal distention, abdominal pain, constipation, nausea and vomiting.  Musculoskeletal: Negative.   Skin: Negative.   Neurological: Negative.   Psychiatric/Behavioral: Negative.      Objective:  Physical Exam Constitutional:      Appearance: He is well-developed. He is ill-appearing.  HENT:     Head: Normocephalic and atraumatic.     Ears:     Comments: Left ear TM bulging clear fluid, right TM normal Cardiovascular:     Rate and Rhythm: Normal rate and regular rhythm.  Pulmonary:     Effort: Pulmonary effort is normal. No respiratory distress.     Breath sounds: Normal breath sounds. No wheezing or rales.  Abdominal:     General: Bowel sounds are normal. There is no distension.     Palpations: Abdomen is soft.     Tenderness: There is no abdominal tenderness. There is no rebound.  Musculoskeletal:     Cervical back: Normal range of motion.  Skin:    General: Skin is warm and dry.  Neurological:     Mental Status: He is alert and oriented to person, place, and time.     Coordination: Coordination normal.     Vitals:   01/31/22 0853 01/31/22 0856  BP: (!) 140/80 (!) 140/80  Pulse: 70   Temp: 98.6 F (37 C)   TempSrc: Oral   SpO2: 94%   Weight: 149 lb (67.6 kg)   Height: '5\' 10"'$  (1.778 m)    Flu shot given at visit Assessment & Plan:  Visit time 35 minutes in face to face communication with patient and coordination of care, additional 15  minutes spent in record review, coordination or care, ordering tests, communicating/referring to other healthcare professionals, documenting in medical records all on the same day of the visit for total time 50 minutes spent on the visit.

## 2022-01-31 NOTE — Patient Instructions (Signed)
We have sent in drops to use in the ear 3 drops 3 times a day for 3 days then as needed.

## 2022-02-02 ENCOUNTER — Ambulatory Visit: Payer: Federal, State, Local not specified - PPO | Admitting: Hematology

## 2022-02-02 DIAGNOSIS — H6090 Unspecified otitis externa, unspecified ear: Secondary | ICD-10-CM | POA: Insufficient documentation

## 2022-02-02 NOTE — Assessment & Plan Note (Signed)
Rx cortisporin ear drops today.

## 2022-02-02 NOTE — Assessment & Plan Note (Signed)
No signs of bleeding since being home and diminished abdominal pain. Is eating okay. Still taking PPI BID and will continue.

## 2022-02-02 NOTE — Assessment & Plan Note (Signed)
Continued cessation and had a long conversation about this as well. He is doing well with this.

## 2022-02-02 NOTE — Assessment & Plan Note (Signed)
We had a long conversation about quality of life and likely recurrence with numbers elevating no specific focus on imaging. He understands and is desiring quality of time and does not have interest in pursuing any further treatment. He did have serious side effects from prior chemo including intractable diarrhea and neuropathy that makes walking more difficult.

## 2022-02-16 ENCOUNTER — Telehealth: Payer: Self-pay | Admitting: Hematology

## 2022-02-16 NOTE — Telephone Encounter (Signed)
Patient's spouse called to r/s for sooner appointment time. Patient is not doing well and spouse wants to speak with MD on options

## 2022-02-20 NOTE — Assessment & Plan Note (Signed)
-  pT1cN0M0, stage I  -diagnosed in 09/2020, s/p Whipple surgery on 10/31/2020.  -He received 6 months of adjuvant chemotherapy with Xarelto. -On cancer surveillance now. -His tumor marker CA 19.9 has been trending up. -his PET scan from 01/18/3022 showed multiple right upper quadrant foci of hypermetabolism which are nonspecific in the postoperative period (2 weeks out from ulcer repair)  -plan to repeat EGD/ERCP to rule out occlude recurrence when he recovers well from surgery

## 2022-02-20 NOTE — Progress Notes (Signed)
[place]   [phone] Fax:([phone]   Clinic Follow up Note   Patient Care Team: Hoyt Koch, MD as PCP - General (Internal Medicine) Deneise Lever, MD as Consulting Physician (Pulmonary Disease) Dwan Bolt, MD as Consulting Physician (General Surgery) Kyung Rudd, MD as Consulting Physician (Radiation Oncology) [name], MD as Consulting Physician (Hematology) [name], MD as Consulting Physician (Hematology) Carol Ada, MD as Consulting Physician (Gastroenterology) [facility] as Consulting Physician (Urology) [place] as Consulting Physician [facility])  Date of Service:  02/21/2022  CHIEF COMPLAINT: f/u of  extrahepatic cholangiocarcinoma   CURRENT THERAPY:  Surveillance    ASSESSMENT:  [name] [name] is a 67 y.o. 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  -He received 6 months of adjuvant chemotherapy with Xarelto. -On cancer surveillance now. -His tumor marker CA 19.9 has been trending up. -his PET scan from 01/18/3022 showed multiple right upper quadrant foci of hypermetabolism which are nonspecific in the postoperative period (2 weeks out from ulcer repair)  -plan to repeat EGD/ERCP to rule out occlude recurrence when he recovers well from surgery  -Patient does not want chemotherapy if he has recurrence of cancer, but is open to biliary stent placement, local radiation, it etc. for symptom improvement.  Jaundice  -lab today showed tbil 4.3 today, new  -will obtain abdominal ultrasound tomorrow morning -I have informed Dr. Benson Norway, he plan to do ERCP later this week    Goal of care discussion, DNR  -Pt has developed multiple symptoms lately, feels fatigued, weak, and is concerned about cancer recurrence.  He states that he would not want chemotherapy if he does have recurrence, he  would like to have hospice.  Since he is in the process of workup to see if he has recurrent cancer.  His prognosis is depends on the result of work up  -I will refer him to [facility] [place] for home palliative care for now, if he became is eligible for hospice, will transition him -Pt wants DNR, I signed his DNR order and gave to him    PLAN: -lab reviewed.  -Stat abdominal ultrasound tomorrow morning -referral Physical Therapy for weakness and balance issue - referral to [facility] [place] for home palliative Care -urgent referral to GI Dr. Benson Norway, he will do ERCP later this week -refill Lomotil -f/u open, likely after ERCP    SUMMARY OF ONCOLOGIC HISTORY: Oncology History Overview Note  Cancer Staging Cholangiocarcinoma [facility]) Staging form: Distal Bile Duct, AJCC 8th Edition - Pathologic stage from 10/31/2020: Stage I (pT1, pN0, cM0) - Signed by [name], MD on 11/22/2020    Cholangiocarcinoma ([place])  09/22/2020 Imaging   US Abdomen  IMPRESSION: Intrahepatic biliary ductal dilation and dilated proximal common bile duct. Findings are consistent with biliary obstruction, possibly due to a common bile duct mass, incompletely evaluated by ultrasound. Recommend multiphase CT or MRCP for further evaluation.   Mildly distended gallbladder filled with material of varying echogenicity, likely sludge and tiny stones. In the absence of leukocytosis and right upper quadrant pain this is unlikely to represent cholecystitis.   Coarsened liver echogenicity with somewhat nodular left hepatic lobe, compatible with chronic liver disease.   09/22/2020 Imaging   CT AP  IMPRESSION: 1. Question bibasilar pulmonary fibrosis. 2. Homogeneous hyperdensity fills the gallbladder lumen - likely represents gallbladder sludge. Intra and extrahepatic biliary ductal dilatation  with no main pancreatic duct dilatation. Findings are consistent with biliary obstruction, possibly due to a common bile duct  mass which is incompletely evaluated on this single phase portal venous study. Recommend MRCP for further evaluation. 3. Nonspecific perirectal fat stranding with under distension of the rectum. 4. Aortic Atherosclerosis (ICD10-I70.0).   09/23/2020 Imaging   MRI Abdomen MRCP  IMPRESSION: 1. Obstructing soft tissue mass in the proximal common bile duct at or adjacent to the junction with the cystic duct, associated with moderate to severe intrahepatic biliary ductal dilatation indicating obstruction, as discussed above. 2. Biliary sludge filling the gallbladder lumen.   09/23/2020 Procedure   ERCP, Dr. Benson Norway  Impression: - The major papilla appeared normal. - A single localized biliary stricture was found in the common bile duct. The stricture was malignant appearing. - The left and right hepatic ducts and all intrahepatic branches and common bile duct were moderately dilated, with a mass causing an obstruction. - A biliary sphincterotomy was performed. - Cells for cytology obtained in the upper third of the main bile duct. - One plastic biliary stent was placed into the common bile duct.   09/23/2020 Pathology Results   A. BILIARY, BRUSHING:   FINAL MICROSCOPIC DIAGNOSIS:  - Malignant cells consistent with adenocarcinoma    10/09/2020 Initial Diagnosis   Cholangiocarcinoma ([place])   10/10/2020 PET scan   IMPRESSION: 1. No evidence of metastatic disease. Common bile duct mass, better seen and described on MR abdomen 09/23/2020. 2. Common bile duct stent in place with persistent biliary ductal dilatation and associated pneumobilia. 3. Tiny left renal stone. 4. Bladder wall thickening. 5. Aortic atherosclerosis (ICD10-I70.0). Coronary artery calcification. 6.  Emphysema (ICD10-J43.9).   10/10/2020 Imaging   CT AP  IMPRESSION: No suspicious liver lesions. Replaced right hepatic artery arising from the SMA.   Common bile duct tumor appears unchanged compared to prior CT. Common  bile duct stent place.   Findings of fibrotic interstitial lung disease in the partially visualized lower chest, could be further evaluated with dedicated high-resolution chest CT.   10/31/2020 Cancer Staging   Staging form: Distal Bile Duct, AJCC 8th Edition - Pathologic stage from 10/31/2020: Stage I (pT1, pN0, cM0) - Signed by [name], MD on 11/22/2020 Stage prefix: Initial diagnosis Total positive nodes: 0 Residual tumor (R): R1 - Microscopic   10/31/2020 Surgery   Whipple Procedure, Dr. Michaelle Birks   10/31/2020 Pathology Results   FINAL MICROSCOPIC DIAGNOSIS:   A. COMMON HEPATIC DUCT MARGIN:  - Adenocarcinoma involving bile duct.   B. NEW COMMON HEPATIC DUCT MARGIN:  - Atypical glands consistent with adenocarcinoma.   C. RIGHT HEPATIC DUCT MARGIN:  - Connective tissue with glands showing cautery artifact.  - No diagnostic malignancy identified.   D. GALLBLADDER, CHOLECYSTECTOMY:  - Benign gallbladder.  - Benign cystic duct lymph node.   E. LYMPH NODE, STATION 8, EXCISION:  - Two lymph nodes negative for metastatic carcinoma (0/2).   F. WHIPPLE PROCEDURE:  - Ductal adenocarcinoma, 1.6 cm.  - Carcinoma involves ampulla of Vater and periductal connective tissue.  - Perineural invasion.  - Surgical margins negative for carcinoma.  - Six lymph nodes negative for metastatic carcinoma (0/6).  - See oncology table.   G. LYMPH NODE, PORTAL, EXCISION:  - One lymph node negative for metastatic carcinoma (0/1).    10/31/2020 Cancer Staging   Staging form: Distal Bile Duct, AJCC 8th Edition - Pathologic stage from 10/31/2020: Stage I (pT1, pN0, cM0) - Signed by [name],  MD on 11/23/2020 Total positive nodes: 0 Residual tumor (R): R1 - Microscopic   05/08/2021 Imaging   EXAM: CT ABDOMEN AND PELVIS WITH CONTRAST  IMPRESSION: 1. No acute findings in the abdomen or pelvis. 2. Status post Whipple procedure. No evidence for local recurrence or metastatic disease. 3. Large  stool volume. Imaging features compatible with clinical constipation. 4. Nonobstructing bilateral nephrolithiasis. 5. Aortic Atherosclerosis (ICD10-I70.0).   01/18/2022 Imaging    IMPRESSION: 1. Multiple right upper quadrant foci of hypermetabolism which are nonspecific in the postoperative period. (2 weeks out from ulcer repair). These are most likely related to postoperative low-grade inflammation. However, early peritoneal and nodal metastasis could have a similar appearance. Consider short-term follow-up with pre and post contrast abdominal MRI at 3 months. 2. No extra abdominal metastatic disease identified. 3. Incidental findings, including: Aortic atherosclerosis (ICD10-I70.0), coronary artery atherosclerosis and emphysema (ICD10-J43.9). Right proximal ureteric calculus. Small volume ascites.      INTERVAL HISTORY:  [name] [name] is here for a follow up of  xtrahepatic cholangiocarcinoma  He was last seen by me on 01/24/2022 He presents to the clinic with family member. Pt states he's not doing well. He states he's fatigue and he has to wear a depend now. Pt states he can not control his Bowels/urine Pt denies of pain in abdomen. The wound is healing good and closed up. Pt states he still working 4 days a week.     All other systems were reviewed with the patient and are negative.  MEDICAL HISTORY:  Past Medical History:  Diagnosis Date   Alcoholism [facility])    pt has not drank since 11/2019   Anxiety    Bilateral carotid artery stenosis    Bipolar disorder ([place])    Cancer ([place]) 2022   bile duct   COPD (chronic obstructive pulmonary disease) ([place])    Depression    " post traumatic stress disorder" sees MD in [place] every 3-6 months.   Diabetes mellitus without complication ([place])    type 2   Dysrhythmia    hx. Bundle branch block- saw Dr. Kathlee Nations   GERD (gastroesophageal reflux disease)    Hypertension    Irregular heart rate    RBBB    Sleep  apnea    cpap used with nose clip- Dr. Baird Lyons follows   Substance abuse [facility])    "tends to self medicate(Rx. meds or Alcohol) trying to get relief from "PSTD"    SURGICAL HISTORY: Past Surgical History:  Procedure Laterality Date   BILIARY BRUSHING  09/23/2020   Procedure: BILIARY BRUSHING;  Surgeon: Carol Ada, MD;  Location: [facility];  Service: Endoscopy;;   BILIARY STENT PLACEMENT N/A 09/23/2020   Procedure: BILIARY STENT PLACEMENT;  Surgeon: Carol Ada, MD;  Location: [facility];  Service: Endoscopy;  Laterality: N/A;   CHOLECYSTECTOMY     removed during whipple procedure   COLONOSCOPY WITH PROPOFOL N/A 01/19/2014   Procedure: COLONOSCOPY WITH PROPOFOL;  Surgeon: Juanita Craver, MD;  Location: [facility];  Service: Endoscopy;  Laterality: N/A;   ERCP N/A 09/23/2020   Procedure: ENDOSCOPIC RETROGRADE CHOLANGIOPANCREATOGRAPHY (ERCP);  Surgeon: Carol Ada, MD;  Location: [facility];  Service: Endoscopy;  Laterality: N/A;   exploratory laparotomy N/A 01/03/2022   s/p graham patch for perforation at [place] N/A 10/02/2021   Procedure: open right inguinal hernia repair with mesh;  Surgeon: Dwan Bolt, MD;  Location: [place];  Service: General;  Laterality: N/A;  LAPAROSCOPY N/A 10/31/2020   Procedure: STAGING LAPAROSCOPY;  Surgeon: Dwan Bolt, MD;  Location: [place];  Service: General;  Laterality: N/A;  GEN AND TAP BLOCK   LAPAROTOMY N/A 01/03/2022   Procedure: EXPLORATORY LAPAROTOMY, GRAHAM PATCH REPAIR OF PERFORATED ULCER;  Surgeon: Donnie Mesa, MD;  Location: [facility];  Service: General;  Laterality: N/A;   SPHINCTEROTOMY  09/23/2020   Procedure: Joan Mayans;  Surgeon: Carol Ada, MD;  Location: [facility];  Service: Endoscopy;;   WHIPPLE PROCEDURE N/A 10/31/2020   Procedure: WHIPPLE PROCEDURE, INTRAOPERATIVE ULTRASOUND;  Surgeon: Dwan Bolt, MD;  Location: [place];  Service: General;  Laterality: N/A;   WISDOM TOOTH  EXTRACTION     67 years old    I have reviewed the social history and family history with the patient and they are unchanged from previous note.  ALLERGIES:  is allergic to quetiapine.  MEDICATIONS:  Current Outpatient Medications  Medication Sig Dispense Refill   albuterol (VENTOLIN HFA) 108 (90 Base) MCG/ACT inhaler Inhale 1-2 puffs into the lungs every 6 (six) hours as needed for wheezing or shortness of breath.     aspirin EC 81 MG tablet Take 81 mg by mouth in the morning. Swallow whole.     busPIRone (BUSPAR) 5 MG tablet Take 5 mg by mouth at bedtime.     diphenoxylate-atropine (LOMOTIL) 2.5-0.025 MG tablet Take 2 tablets by mouth in the morning and at bedtime. 120 tablet 1   MILK THISTLE PO Take 1 capsule by mouth daily with breakfast.     Multiple Vitamin (MULTIVITAMIN WITH MINERALS) TABS tablet Take 1 tablet by mouth daily with breakfast.     neomycin-polymyxin-hydrocortisone (CORTISPORIN) OTIC solution Place 3 drops into the left ear 3 (three) times daily. 10 mL 0   PANCREAZE 4200-14200 units CPEP Take 4,200 units of lipase by mouth 2 (two) times daily.     Pancrelipase, Lip-Prot-Amyl, 4200-14200 units CPEP Take 4,200 units of lipase by mouth 3 times daily with meals, bedtime and 2 AM. Take 2 pills by mouth with Breakfast, Lunch, and Diner (large meals).  Take 1 pill by mouth with snacks (2 snacks/day) 720 capsule 1   PARoxetine (PAXIL) 30 MG tablet Take 60 mg by mouth at bedtime.     Probiotic Product (PROBIOTIC PO) Take 1 capsule by mouth in the morning.     SYSTANE 0.4-0.3 % GEL ophthalmic gel Place 1 Application into both eyes in the morning.     SYSTANE COMPLETE PF 0.6 % SOLN Place 1 drop into both eyes See admin instructions. Instill 1 drop into both eyes 3-4 times a day     tamsulosin (FLOMAX) 0.4 MG CAPS capsule Take 0.4 mg by mouth daily.     tiaGABine (GABITRIL) 4 MG tablet Take 8 mg by mouth at bedtime.     No current facility-administered medications for this visit.     PHYSICAL EXAMINATION: ECOG PERFORMANCE STATUS: 2 - Symptomatic, <50% confined to bed  Vitals:   02/21/22 0905  BP: (!) 153/88  Pulse: 65  Resp: 18  Temp: 98.1 F (36.7 C)  SpO2: 100%   Wt Readings from Last 3 Encounters:  02/21/22 156 lb (70.8 kg)  01/31/22 149 lb (67.6 kg)  01/24/22 150 lb 12.8 oz (68.4 kg)    GENERAL:alert, no distress and comfortable SKIN: skin color, texture, turgor are normal, no rashes or significant lesions EYES: normal, Conjunctiva are pink and non-injected, sclera clear NECK: supple, thyroid normal size, non-tender, without nodularity LYMPH:  no  palpable lymphadenopathy in the cervical, axillary  LUNGS: clear to auscultation and percussion with normal breathing effort HEART: regular rate & rhythm and no murmurs and no lower extremity edema ABDOMEN:(+)Right side palpable lump felt.Enlarge liver  abdomen soft, non-tender and normal bowel sounds Musculoskeletal:no cyanosis of digits and no clubbing  NEURO: alert & oriented x 3 with fluent speech, no focal motor/sensory deficits  LABORATORY DATA:  I have reviewed the data as listed    Latest Ref Rng & Units 02/21/2022    8:45 AM 01/18/2022    2:02 PM 01/06/2022    5:36 AM  CBC  WBC 4.0 - 10.5 K/uL 7.0  6.5  8.8   Hemoglobin 13.0 - 17.0 g/dL 10.8  11.1  13.1   Hematocrit 39.0 - 52.0 % 31.7  32.3  39.4   Platelets 150 - 400 K/uL 204  258  201         Latest Ref Rng & Units 02/21/2022    8:45 AM 01/18/2022    2:02 PM 01/06/2022    5:36 AM  CMP  Glucose 70 - 99 mg/dL 117  74  56   BUN 8 - 23 mg/dL '14  7  16   '$ Creatinine 0.61 - 1.24 mg/dL 0.68  0.51  0.65   Sodium 135 - 145 mmol/L 141  141  139   Potassium 3.5 - 5.1 mmol/L 3.4  3.5  3.5   Chloride 98 - 111 mmol/L 104  103  104   CO2 22 - 32 mmol/L 32  34  24   Calcium 8.9 - 10.3 mg/dL 8.2  8.8  8.5   Total Protein 6.5 - 8.1 g/dL 5.6  6.6    Total Bilirubin 0.3 - 1.2 mg/dL 4.3  0.6    Alkaline Phos 38 - 126 U/L 1,756  987    AST 15 - 41  U/L 91  70    ALT 0 - 44 U/L 62  51        RADIOGRAPHIC STUDIES: I have personally reviewed the radiological images as listed and agreed with the findings in the report. No results found.    Orders Placed This Encounter  Procedures   US Abdomen Complete    Standing Status:   Future    Standing Expiration Date:   02/21/2023    Order Specific Question:   Reason for Exam (SYMPTOM  OR DIAGNOSIS REQUIRED)    Answer:   hyperbilirubinemia    Order Specific Question:   Preferred imaging location?    Answer:   [facility]   Ambulatory referral to Physical Therapy    Referral Priority:   Urgent    Referral Type:   Physical Medicine    Referral Reason:   Specialty Services Required    Requested Specialty:   Physical Therapy    Number of Visits Requested:   1   All questions were answered. The patient knows to call the clinic with any problems, questions or concerns. No barriers to learning was detected. The total time spent in the appointment was 40 minutes.     [name], MD 02/21/2022   Felicity Coyer, CMA, am acting as scribe for [name], MD.   I have reviewed the above documentation for accuracy and completeness, and I agree with the above.

## 2022-02-21 ENCOUNTER — Encounter: Payer: Self-pay | Admitting: Hematology

## 2022-02-21 ENCOUNTER — Emergency Department (HOSPITAL_COMMUNITY): Payer: Federal, State, Local not specified - PPO

## 2022-02-21 ENCOUNTER — Other Ambulatory Visit: Payer: Self-pay | Admitting: Gastroenterology

## 2022-02-21 ENCOUNTER — Observation Stay (HOSPITAL_COMMUNITY)
Admission: EM | Admit: 2022-02-21 | Discharge: 2022-02-22 | Discharge status: Against Medical Advice | Payer: Federal, State, Local not specified - PPO | Attending: Emergency Medicine | Admitting: Emergency Medicine

## 2022-02-21 ENCOUNTER — Other Ambulatory Visit: Payer: Self-pay

## 2022-02-21 ENCOUNTER — Encounter (HOSPITAL_COMMUNITY): Payer: Self-pay

## 2022-02-21 ENCOUNTER — Inpatient Hospital Stay (HOSPITAL_BASED_OUTPATIENT_CLINIC_OR_DEPARTMENT_OTHER): Payer: Federal, State, Local not specified - PPO | Admitting: Hematology

## 2022-02-21 ENCOUNTER — Inpatient Hospital Stay: Payer: Federal, State, Local not specified - PPO | Attending: Hematology

## 2022-02-21 VITALS — BP 153/88 | HR 65 | Temp 98.1°F | Resp 18 | Ht 70.0 in | Wt 156.0 lb

## 2022-02-21 DIAGNOSIS — R7401 Elevation of levels of liver transaminase levels: Principal | ICD-10-CM | POA: Insufficient documentation

## 2022-02-21 DIAGNOSIS — F17210 Nicotine dependence, cigarettes, uncomplicated: Secondary | ICD-10-CM | POA: Insufficient documentation

## 2022-02-21 DIAGNOSIS — C249 Malignant neoplasm of biliary tract, unspecified: Secondary | ICD-10-CM

## 2022-02-21 DIAGNOSIS — K3184 Gastroparesis: Secondary | ICD-10-CM | POA: Diagnosis not present

## 2022-02-21 DIAGNOSIS — Z79899 Other long term (current) drug therapy: Secondary | ICD-10-CM | POA: Diagnosis not present

## 2022-02-21 DIAGNOSIS — C221 Intrahepatic bile duct carcinoma: Secondary | ICD-10-CM | POA: Diagnosis not present

## 2022-02-21 DIAGNOSIS — E876 Hypokalemia: Secondary | ICD-10-CM

## 2022-02-21 DIAGNOSIS — Z8719 Personal history of other diseases of the digestive system: Secondary | ICD-10-CM

## 2022-02-21 DIAGNOSIS — N132 Hydronephrosis with renal and ureteral calculous obstruction: Secondary | ICD-10-CM | POA: Diagnosis not present

## 2022-02-21 DIAGNOSIS — E119 Type 2 diabetes mellitus without complications: Secondary | ICD-10-CM | POA: Diagnosis not present

## 2022-02-21 DIAGNOSIS — R531 Weakness: Secondary | ICD-10-CM | POA: Diagnosis present

## 2022-02-21 DIAGNOSIS — E118 Type 2 diabetes mellitus with unspecified complications: Secondary | ICD-10-CM | POA: Diagnosis present

## 2022-02-21 DIAGNOSIS — J449 Chronic obstructive pulmonary disease, unspecified: Secondary | ICD-10-CM | POA: Insufficient documentation

## 2022-02-21 DIAGNOSIS — I10 Essential (primary) hypertension: Secondary | ICD-10-CM | POA: Diagnosis not present

## 2022-02-21 DIAGNOSIS — Z7982 Long term (current) use of aspirin: Secondary | ICD-10-CM | POA: Diagnosis not present

## 2022-02-21 DIAGNOSIS — N1330 Unspecified hydronephrosis: Secondary | ICD-10-CM | POA: Diagnosis present

## 2022-02-21 DIAGNOSIS — N201 Calculus of ureter: Secondary | ICD-10-CM

## 2022-02-21 LAB — COMPREHENSIVE METABOLIC PANEL
ALT: 65 U/L — ABNORMAL HIGH (ref 0–44)
AST: 94 U/L — ABNORMAL HIGH (ref 15–41)
Albumin: 2.7 g/dL — ABNORMAL LOW (ref 3.5–5.0)
Alkaline Phosphatase: 1586 U/L — ABNORMAL HIGH (ref 38–126)
Anion gap: 10 (ref 5–15)
BUN: 16 mg/dL (ref 8–23)
CO2: 25 mmol/L (ref 22–32)
Calcium: 8.1 mg/dL — ABNORMAL LOW (ref 8.9–10.3)
Chloride: 105 mmol/L (ref 98–111)
Creatinine, Ser: 0.62 mg/dL (ref 0.61–1.24)
GFR, Estimated: >60 mL/min (ref >60)
Glucose, Bld: 134 mg/dL — ABNORMAL HIGH (ref 70–99)
Potassium: 2.9 mmol/L — ABNORMAL LOW (ref 3.5–5.1)
Sodium: 140 mmol/L (ref 135–145)
Total Bilirubin: 4.6 mg/dL — ABNORMAL HIGH (ref 0.3–1.2)
Total Protein: 6.6 g/dL (ref 6.5–8.1)

## 2022-02-21 LAB — CBC
HCT: 35.1 % — ABNORMAL LOW (ref 39.0–52.0)
Hemoglobin: 11.5 g/dL — ABNORMAL LOW (ref 13.0–17.0)
MCH: 33.6 pg (ref 26.0–34.0)
MCHC: 32.8 g/dL (ref 30.0–36.0)
MCV: 102.6 fL — ABNORMAL HIGH (ref 80.0–100.0)
Platelets: 209 10*3/uL (ref 150–400)
RBC: 3.42 MIL/uL — ABNORMAL LOW (ref 4.22–5.81)
RDW: 17.1 % — ABNORMAL HIGH (ref 11.5–15.5)
WBC: 7.5 10*3/uL (ref 4.0–10.5)
nRBC: 0 % (ref 0.0–0.2)

## 2022-02-21 LAB — CBC WITH DIFFERENTIAL (CANCER CENTER ONLY)
Abs Immature Granulocytes: 0.03 10*3/uL (ref 0.00–0.07)
Basophils Absolute: 0 10*3/uL (ref 0.0–0.1)
Basophils Relative: 0 %
Eosinophils Absolute: 0.1 10*3/uL (ref 0.0–0.5)
Eosinophils Relative: 1 %
HCT: 31.7 % — ABNORMAL LOW (ref 39.0–52.0)
Hemoglobin: 10.8 g/dL — ABNORMAL LOW (ref 13.0–17.0)
Immature Granulocytes: 0 %
Lymphocytes Relative: 4 %
Lymphs Abs: 0.3 10*3/uL — ABNORMAL LOW (ref 0.7–4.0)
MCH: 34 pg (ref 26.0–34.0)
MCHC: 34.1 g/dL (ref 30.0–36.0)
MCV: 99.7 fL (ref 80.0–100.0)
Monocytes Absolute: 0.5 10*3/uL (ref 0.1–1.0)
Monocytes Relative: 7 %
Neutro Abs: 6.2 10*3/uL (ref 1.7–7.7)
Neutrophils Relative %: 88 %
Platelet Count: 204 10*3/uL (ref 150–400)
RBC: 3.18 MIL/uL — ABNORMAL LOW (ref 4.22–5.81)
RDW: 16.7 % — ABNORMAL HIGH (ref 11.5–15.5)
WBC Count: 7 10*3/uL (ref 4.0–10.5)
nRBC: 0 % (ref 0.0–0.2)

## 2022-02-21 LAB — CMP (CANCER CENTER ONLY)
ALT: 62 U/L — ABNORMAL HIGH (ref 0–44)
AST: 91 U/L — ABNORMAL HIGH (ref 15–41)
Albumin: 2.9 g/dL — ABNORMAL LOW (ref 3.5–5.0)
Alkaline Phosphatase: 1756 U/L — ABNORMAL HIGH (ref 38–126)
Anion gap: 5 (ref 5–15)
BUN: 14 mg/dL (ref 8–23)
CO2: 32 mmol/L (ref 22–32)
Calcium: 8.2 mg/dL — ABNORMAL LOW (ref 8.9–10.3)
Chloride: 104 mmol/L (ref 98–111)
Creatinine: 0.68 mg/dL (ref 0.61–1.24)
GFR, Estimated: >60 mL/min (ref >60)
Glucose, Bld: 117 mg/dL — ABNORMAL HIGH (ref 70–99)
Potassium: 3.4 mmol/L — ABNORMAL LOW (ref 3.5–5.1)
Sodium: 141 mmol/L (ref 135–145)
Total Bilirubin: 4.3 mg/dL (ref 0.3–1.2)
Total Protein: 5.6 g/dL — ABNORMAL LOW (ref 6.5–8.1)

## 2022-02-21 LAB — I-STAT CHEM 8, ED
BUN: 15 mg/dL (ref 8–23)
Calcium, Ion: 1.08 mmol/L — ABNORMAL LOW (ref 1.15–1.40)
Chloride: 104 mmol/L (ref 98–111)
Creatinine, Ser: 0.9 mg/dL (ref 0.61–1.24)
Glucose, Bld: 142 mg/dL — ABNORMAL HIGH (ref 70–99)
HCT: 35 % — ABNORMAL LOW (ref 39.0–52.0)
Hemoglobin: 11.9 g/dL — ABNORMAL LOW (ref 13.0–17.0)
Potassium: 3.1 mmol/L — ABNORMAL LOW (ref 3.5–5.1)
Sodium: 143 mmol/L (ref 135–145)
TCO2: 23 mmol/L (ref 22–32)

## 2022-02-21 LAB — URINALYSIS, ROUTINE W REFLEX MICROSCOPIC
Bilirubin Urine: NEGATIVE
Glucose, UA: NEGATIVE mg/dL
Hgb urine dipstick: NEGATIVE
Ketones, ur: NEGATIVE mg/dL
Leukocytes,Ua: NEGATIVE
Nitrite: NEGATIVE
Protein, ur: NEGATIVE mg/dL
Specific Gravity, Urine: 1.023 (ref 1.005–1.030)
pH: 6 (ref 5.0–8.0)

## 2022-02-21 LAB — LIPASE, BLOOD: Lipase: 21 U/L (ref 11–51)

## 2022-02-21 MED ORDER — POTASSIUM CHLORIDE 10 MEQ/100ML IV SOLN
10.0000 meq | INTRAVENOUS | Status: AC
  Administered 2022-02-21 – 2022-02-22 (×2): 10 meq via INTRAVENOUS
  Filled 2022-02-21 (×2): qty 100

## 2022-02-21 MED ORDER — SODIUM CHLORIDE 0.9 % IV BOLUS (SEPSIS)
1000.0000 mL | Freq: Once | INTRAVENOUS | Status: AC
  Administered 2022-02-21: 1000 mL via INTRAVENOUS

## 2022-02-21 MED ORDER — ONDANSETRON HCL 4 MG/2ML IJ SOLN
4.0000 mg | Freq: Once | INTRAMUSCULAR | Status: AC
  Administered 2022-02-21: 4 mg via INTRAVENOUS
  Filled 2022-02-21: qty 2

## 2022-02-21 MED ORDER — IOHEXOL 300 MG/ML  SOLN
100.0000 mL | Freq: Once | INTRAMUSCULAR | Status: AC | PRN
  Administered 2022-02-21: 100 mL via INTRAVENOUS

## 2022-02-21 MED ORDER — DIPHENOXYLATE-ATROPINE 2.5-0.025 MG PO TABS: 2.0000 | ORAL_TABLET | Freq: Two times a day (BID) | ORAL | 1 refills | Status: AC

## 2022-02-21 MED ORDER — SODIUM CHLORIDE 0.9 % IV SOLN
1000.0000 mL | INTRAVENOUS | Status: DC
  Administered 2022-02-21: 1000 mL via INTRAVENOUS

## 2022-02-21 NOTE — ED Provider Notes (Signed)
[facility] DEPT Provider Note   CSN: [id number] Arrival date & time: 02/21/22  2007     History {Add pertinent medical, surgical, social history, OB history to HPI:1} Chief Complaint  Patient presents with   Weakness   Abdominal Pain    [name] [name] is a 67 y.o. male.   Weakness Associated symptoms: abdominal pain   Abdominal Pain  Patient has history of anxiety sleep apnea alcohol use in remission, COPD, diabetes, cholangiocarcinoma, perforated duodenal ulcer who presents to the ED with complaints of abdominal pain and bloating.  Patient states he noticed decreased bowel movements and nausea over the last several days.  He also noticed increasing abdominal bloating.    Home Medications Prior to Admission medications   Medication Sig Start Date End Date Taking? Authorizing Provider  albuterol (VENTOLIN HFA) 108 (90 Base) MCG/ACT inhaler Inhale 1-2 puffs into the lungs every 6 (six) hours as needed for wheezing or shortness of breath.    [provider]  aspirin EC 81 MG tablet Take 81 mg by mouth in the morning. Swallow whole.    [provider]  busPIRone (BUSPAR) 5 MG tablet Take 5 mg by mouth at bedtime. 07/01/20   [provider]  diphenoxylate-atropine (LOMOTIL) 2.5-0.025 MG tablet Take 2 tablets by mouth in the morning and at bedtime. 02/21/22   [name], MD  MILK THISTLE PO Take 1 capsule by mouth daily with breakfast.    [provider]  Multiple Vitamin (MULTIVITAMIN WITH MINERALS) TABS tablet Take 1 tablet by mouth daily with breakfast.    [provider]  neomycin-polymyxin-hydrocortisone (CORTISPORIN) OTIC solution Place 3 drops into the left ear 3 (three) times daily. 01/31/22   Hoyt Koch, MD  PANCREAZE [phone] units CPEP Take 4,200 units of lipase by mouth 2 (two) times daily.    [provider]  Pancrelipase, Lip-Prot-Amyl, [phone] units CPEP Take 4,200 units of  lipase by mouth 3 times daily with meals, bedtime and 2 AM. Take 2 pills by mouth with Breakfast, Lunch, and Diner (large meals).  Take 1 pill by mouth with snacks (2 snacks/day) 01/02/22   [name], MD  PARoxetine (PAXIL) 30 MG tablet Take 60 mg by mouth at bedtime.    [provider]  Probiotic Product (PROBIOTIC PO) Take 1 capsule by mouth in the morning.    [provider]  SYSTANE 0.4-0.3 % GEL ophthalmic gel Place 1 Application into both eyes in the morning.    [provider]  SYSTANE COMPLETE PF 0.6 % SOLN Place 1 drop into both eyes See admin instructions. Instill 1 drop into both eyes 3-4 times a day    [provider]  tamsulosin (FLOMAX) 0.4 MG CAPS capsule Take 0.4 mg by mouth daily.    [provider]  tiaGABine (GABITRIL) 4 MG tablet Take 8 mg by mouth at bedtime.    [provider]      Allergies    Quetiapine    Review of Systems   Review of Systems  Gastrointestinal:  Positive for abdominal pain.  Neurological:  Positive for weakness.    Physical Exam Updated Vital Signs BP 139/86   Pulse 85   Temp 97.6 F (36.4 C) (Oral)   Resp 15   SpO2 96%  Physical Exam  ED Results / Procedures / Treatments   Labs (all labs ordered are listed, but only abnormal results are displayed) Labs Reviewed  CBC - Abnormal; Notable for the  following components:      Result Value   RBC 3.42 (*)    Hemoglobin 11.5 (*)    HCT 35.1 (*)    MCV 102.6 (*)    RDW 17.1 (*)    All other components within normal limits  COMPREHENSIVE METABOLIC PANEL - Abnormal; Notable for the following components:   Potassium 2.9 (*)    Glucose, Bld 134 (*)    Calcium 8.1 (*)    Albumin 2.7 (*)    AST 94 (*)    ALT 65 (*)    Alkaline Phosphatase 1,586 (*)    Total Bilirubin 4.6 (*)    All other components within normal limits  URINALYSIS, ROUTINE W REFLEX MICROSCOPIC - Abnormal; Notable for the following components:   Color, Urine AMBER (*)     All other components within normal limits  I-STAT CHEM 8, ED - Abnormal; Notable for the following components:   Potassium 3.1 (*)    Glucose, Bld 142 (*)    Calcium, Ion 1.08 (*)    Hemoglobin 11.9 (*)    HCT 35.0 (*)    All other components within normal limits  LIPASE, BLOOD    EKG None  Radiology CT ABDOMEN PELVIS W CONTRAST  Result Date: 02/21/2022 CLINICAL DATA:  Acute abdominal pain, history of prior Whipple procedure and perforated gastric ulcer repair, initial encounter EXAM: CT ABDOMEN AND PELVIS WITH CONTRAST TECHNIQUE: Multidetector CT imaging of the abdomen and pelvis was performed using the standard protocol following bolus administration of intravenous contrast. RADIATION DOSE REDUCTION: This exam was performed according to the departmental dose-optimization program which includes automated exposure control, adjustment of the mA and/or kV according to patient size and/or use of iterative reconstruction technique. CONTRAST:  134m OMNIPAQUE IOHEXOL 300 MG/ML  SOLN COMPARISON:  01/18/2022 PET-CT FINDINGS: Lower chest: Scarring is noted in the right lower lobe and right middle lobe stable in appearance from the prior exam. No sizable effusion is seen. Hepatobiliary: Liver shows changes of pneumobilia as well as mild biliary ductal dilatation low to that seen on prior exams. No discrete hepatic mass is seen. The gallbladder has been surgically removed. Pancreas: There are changes consistent with the prior Whipple procedure. The body and tail are atrophic in nature. Spleen: Normal in size without focal abnormality. Adrenals/Urinary Tract: Adrenal glands are within normal limits. Kidneys demonstrate a normal enhancement pattern bilaterally. Tiny nonobstructing renal calculus is noted on the left. Normal excretion is noted bilaterally. Fullness of the right renal collecting system is noted secondary to a proximal ureteral stone just below the right UPJ. The stone measures approximately 6-7  mm and is stable in appearance. The degree of dilatation of the collecting system has increased slightly in the interval from the prior exam. Distal ureters are within normal limits. The bladder is well distended. Stomach/Bowel: Scattered fecal material is noted throughout the colon. No obstructive changes are seen. No obstructive changes of the small bowel are seen. The appendix is not well visualized. No inflammatory changes to suggest appendicitis are noted. The stomach is significantly distended with ingested material which may represent a degree of gastroparesis. No obstructive change is seen. Vascular/Lymphatic: Aortic atherosclerosis. No enlarged abdominal or pelvic lymph nodes. Reproductive: Prostate is unremarkable. Other: Free fluid is noted within the abdomen adjacent to the liver and spleen. This has increased somewhat in the interval from the prior exam. No free air is noted. Musculoskeletal: Degenerative changes of the lumbar spine are noted. IMPRESSION: Changes consistent with the known  history of prior Whipple procedure. A 7 mm proximal right ureteral stone with evidence of right-sided hydronephrosis. The hydronephrosis has increased slightly in the interval from the prior exam. Significant dilation of the stomach with ingested food stuffs. No ulcer is seen. No definitive obstructive changes are noted. Correlate with patient's history of last meal. This may represent a generalized gastroparesis. Ascites which is slightly increased when compared with the prior study. Stable mild biliary ductal dilatation and pneumobilia. Electronically Signed   By: Inez Catalina M.D.   On: 02/21/2022 22:03    Procedures .Critical Care  Performed by: Dorie Rank, MD Authorized by: Dorie Rank, MD   Critical care provider statement:    Critical care time (minutes):  45   Critical care was time spent personally by me on the following activities:  Development of treatment plan with patient or surrogate, discussions  with consultants, evaluation of patient's response to treatment, examination of patient, ordering and review of laboratory studies, ordering and review of radiographic studies, ordering and performing treatments and interventions, pulse oximetry, re-evaluation of patient's condition and review of old charts   {Document cardiac monitor, telemetry assessment procedure when appropriate:1}  Medications Ordered in ED Medications  sodium chloride 0.9 % bolus 1,000 mL (0 mLs Intravenous Stopped 02/21/22 2138)    Followed by  0.9 %  sodium chloride infusion (1,000 mLs Intravenous New Bag/Given 02/21/22 2137)  potassium chloride 10 mEq in 100 mL IVPB (10 mEq Intravenous New Bag/Given 02/21/22 2303)  ondansetron (ZOFRAN) injection 4 mg (4 mg Intravenous Given 02/21/22 2050)  iohexol (OMNIPAQUE) 300 MG/ML solution 100 mL (100 mLs Intravenous Contrast Given 02/21/22 2121)    ED Course/ Medical Decision Making/ A&P Clinical Course as of 02/21/22 2327  Wed Feb 21, 2022  2237 CT scan shows a 7 mm proximal ureteral stone.  Patient also has significant dilatation of the stomach with ingested food stuff.  No evidence of ulcer no definitive obstruction.  Patient also has ascites slightly increased compared to previous.  Stable mild biliary dilatation dilatation and pneumobilia [JK]  2313 Labs reviewed.  Patient has increasing enzymes.  Hypokalemia noted.  Stable anemia.  No signs of urinary tract infection [JK]  2315 Case reviewed with Dr. Collene Mares.  Requests hospital admission.  GI will see the patient in the morning. [JK]    Clinical Course User Index [JK] Dorie Rank, MD                           Medical Decision Making Problems Addressed: Cholangiocarcinoma [facility]): chronic illness or injury with exacerbation, progression, or side effects of treatment Gastroparesis: acute illness or injury that poses a threat to life or bodily functions Hypokalemia: acute illness or injury that poses a threat to life or bodily  functions  Amount and/or Complexity of Data Reviewed Labs: ordered. Decision-making details documented in ED Course. Radiology: ordered and independent interpretation performed.  Risk Prescription drug management.  Patient presented to ED for evaluation of abdominal pain abdominal bloating creased bowel movements.  Was concerned about the possibility of intestinal obstruction considering his history of Whipple procedure and known cholangiocarcinoma.  Patient's labs do show elevation in his liver enzymes trending upward consistent with his known cholangiocarcinoma.  Patient's labs also showed hypokalemia.  Patient has been given IV potassium.  CT scan shows evidence of possible gastroparesis but no definite gastric obstruction.  No evidence of bowel obstruction.  Patient does have evidence of ascites and known ureteral  stone.  Patient states he has seen urology for this and they were not planning on any acute intervention.  Previous CT scan in November showed that same ureteral stone.  I have consulted with gastroenterology Dr. Collene Mares considering his distended stomach and his worsening liver enzymes.  She recommends admission to the hospital.  GI will see the patient in the morning to discuss further treatment and evaluation.   {Document critical care time when appropriate:1} {Document review of labs and clinical decision tools ie heart score, Chads2Vasc2 etc:1}  {Document your independent review of radiology images, and any outside records:1} {Document your discussion with family members, caretakers, and with consultants:1} {Document social determinants of health affecting pt's care:1} {Document your decision making why or why not admission, treatments were needed:1} Final Clinical Impression(s) / ED Diagnoses Final diagnoses:  Cholangiocarcinoma ([place])  Gastroparesis  Hypokalemia  Ureteral stone    Rx / DC Orders ED Discharge Orders     None

## 2022-02-21 NOTE — ED Notes (Signed)
Blue, [employer], Delice Bison, and [employer] sent to the lab

## 2022-02-21 NOTE — ED Provider Triage Note (Signed)
Emergency Medicine Provider Triage Evaluation Note  [name] [name] , a 67 y.o. male  was evaluated in triage.  Pt complains of abdominal bloating, nausea, constipation.  Patient reports recent abdominal surgery with [place] surgery where he was supposedly found to have cancer in his colon.  The patient states that for the last 2 days he has been unable to have a bowel movement.  Patient concerned that his intestines were "rupturing".  Patient states he has history of alcoholism, denies any cirrhosis history.  Patient abdomen very distended on examination.  Patient reports discomfort coming from pressure in abdomen.  Review of Systems  Positive:  Negative:   Physical Exam  There were no vitals taken for this visit. Gen:   Awake, no distress   Resp:  Normal effort  MSK:   Moves extremities without difficulty  Other:    Medical Decision Making  Medically screening exam initiated at 8:09 PM.  Appropriate orders placed.  [name] [name] was informed that the remainder of the evaluation will be completed by another provider, this initial triage assessment does not replace that evaluation, and the importance of remaining in the ED until their evaluation is complete.     Azucena Cecil, PA-C 02/21/22 2010

## 2022-02-21 NOTE — ED Triage Notes (Signed)
Pt reports with abdominal pain and vomiting. Pt reports having a hx of Ca and states that he thinks he is blocked again. Pt fell out of his vehicle getting to the emergency room.

## 2022-02-22 DIAGNOSIS — Z8719 Personal history of other diseases of the digestive system: Secondary | ICD-10-CM

## 2022-02-22 DIAGNOSIS — R7401 Elevation of levels of liver transaminase levels: Secondary | ICD-10-CM | POA: Diagnosis not present

## 2022-02-22 DIAGNOSIS — E118 Type 2 diabetes mellitus with unspecified complications: Secondary | ICD-10-CM

## 2022-02-22 DIAGNOSIS — N1330 Unspecified hydronephrosis: Secondary | ICD-10-CM

## 2022-02-22 DIAGNOSIS — C221 Intrahepatic bile duct carcinoma: Secondary | ICD-10-CM

## 2022-02-22 LAB — CBC
HCT: 32 % — ABNORMAL LOW (ref 39.0–52.0)
Hemoglobin: 10.6 g/dL — ABNORMAL LOW (ref 13.0–17.0)
MCH: 33.5 pg (ref 26.0–34.0)
MCHC: 33.1 g/dL (ref 30.0–36.0)
MCV: 101.3 fL — ABNORMAL HIGH (ref 80.0–100.0)
Platelets: 174 10*3/uL (ref 150–400)
RBC: 3.16 MIL/uL — ABNORMAL LOW (ref 4.22–5.81)
RDW: 16.9 % — ABNORMAL HIGH (ref 11.5–15.5)
WBC: 7.1 10*3/uL (ref 4.0–10.5)
nRBC: 0 % (ref 0.0–0.2)

## 2022-02-22 LAB — COMPREHENSIVE METABOLIC PANEL
ALT: 54 U/L — ABNORMAL HIGH (ref 0–44)
AST: 77 U/L — ABNORMAL HIGH (ref 15–41)
Albumin: 2.3 g/dL — ABNORMAL LOW (ref 3.5–5.0)
Alkaline Phosphatase: 1269 U/L — ABNORMAL HIGH (ref 38–126)
Anion gap: 5 (ref 5–15)
BUN: 14 mg/dL (ref 8–23)
CO2: 27 mmol/L (ref 22–32)
Calcium: 7.6 mg/dL — ABNORMAL LOW (ref 8.9–10.3)
Chloride: 106 mmol/L (ref 98–111)
Creatinine, Ser: 0.45 mg/dL — ABNORMAL LOW (ref 0.61–1.24)
GFR, Estimated: >60 mL/min (ref >60)
Glucose, Bld: 110 mg/dL — ABNORMAL HIGH (ref 70–99)
Potassium: 3 mmol/L — ABNORMAL LOW (ref 3.5–5.1)
Sodium: 138 mmol/L (ref 135–145)
Total Bilirubin: 4.4 mg/dL — ABNORMAL HIGH (ref 0.3–1.2)
Total Protein: 5.6 g/dL — ABNORMAL LOW (ref 6.5–8.1)

## 2022-02-22 LAB — CANCER ANTIGEN 19-9: CA 19-9: 2156 U/mL — ABNORMAL HIGH (ref 0–35)

## 2022-02-22 LAB — CBG MONITORING, ED
Glucose-Capillary: 124 mg/dL — ABNORMAL HIGH (ref 70–99)
Glucose-Capillary: 103 mg/dL — ABNORMAL HIGH (ref 70–99)

## 2022-02-22 LAB — HIV ANTIBODY (ROUTINE TESTING W REFLEX): HIV Screen 4th Generation wRfx: NONREACTIVE

## 2022-02-22 MED ORDER — INSULIN ASPART 100 UNIT/ML IJ SOLN
0.0000 [IU] | INTRAMUSCULAR | Status: DC
  Filled 2022-02-22: qty 0.09

## 2022-02-22 MED ORDER — MORPHINE SULFATE (PF) 2 MG/ML IV SOLN
2.0000 mg | INTRAVENOUS | Status: DC | PRN
  Administered 2022-02-22 (×2): 2 mg via INTRAVENOUS
  Filled 2022-02-22 (×2): qty 1

## 2022-02-22 MED ORDER — POTASSIUM CHLORIDE 10 MEQ/100ML IV SOLN
10.0000 meq | INTRAVENOUS | Status: AC
  Administered 2022-02-22 (×2): 10 meq via INTRAVENOUS
  Filled 2022-02-22 (×2): qty 100

## 2022-02-22 MED ORDER — LACTATED RINGERS IV SOLN: INTRAVENOUS | Status: DC

## 2022-02-22 MED ORDER — PANTOPRAZOLE SODIUM 40 MG IV SOLR
40.0000 mg | Freq: Two times a day (BID) | INTRAVENOUS | Status: DC
  Filled 2022-02-22: qty 10

## 2022-02-22 MED ORDER — ACETAMINOPHEN 325 MG PO TABS: 650.0000 mg | ORAL_TABLET | Freq: Four times a day (QID) | ORAL | Status: DC | PRN

## 2022-02-22 MED ORDER — ACETAMINOPHEN 650 MG RE SUPP: 650.0000 mg | Freq: Four times a day (QID) | RECTAL | Status: DC | PRN

## 2022-02-22 MED ORDER — ONDANSETRON HCL 4 MG/2ML IJ SOLN: 4.0000 mg | Freq: Four times a day (QID) | INTRAMUSCULAR | Status: DC | PRN

## 2022-02-22 MED ORDER — ONDANSETRON HCL 4 MG PO TABS: 4.0000 mg | ORAL_TABLET | Freq: Four times a day (QID) | ORAL | Status: DC | PRN

## 2022-02-22 NOTE — Assessment & Plan Note (Addendum)
H/o perforated Duodenal ulcer in Nov, s/p surgical repair. No new perforation on CT today, but ? If contributing to GI symptoms given very recent surgery, repair, and CT findings today. Med rec is pending, but I'm going to put him on IV protonix BID for the moment given that history.

## 2022-02-22 NOTE — ED Notes (Addendum)
Patient seen walking out of the department with a woman. MD notified.

## 2022-02-22 NOTE — Discharge Summary (Signed)
Discharge Summary  [name] [name] ERD:[id number] DOB: September 05, 1955  PCP: Hoyt Koch, MD  Admit date: 02/21/2022 Discharge date: 02/22/2022    Recommendations for Outpatient Follow-up: this patient is admitted around 4: 30am , this patient walked out of the ED Room after admitted , I did not get a change to evaluate him  Details please see HPI done this am by [name]   Discharge Diagnoses:  Active Hospital Problems   Diagnosis Date Noted   Transaminitis 02/22/2022    Priority: High   History of duodenal ulcer 02/22/2022    Priority: Medium    Cholangiocarcinoma ([name] [name]) 10/09/2020    Priority: Medium    Hydronephrosis of right kidney 02/22/2022    Priority: Low   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    There were no vitals filed for this visit.  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 NPO for the moment EDP spoke with Dr. Collene Mares, GI will consult on pt in hospital Conveniently pt already scheduled for repeat ERCP for diagnosis later tomorrow at [facility] with Dr. Benson Norway it looks like (would be useful in evaluating for BOTH of above issues / concerns). Morphine PRN pain Repeat CMP in AM IVF, replacing K  History of duodenal  ulcer H/o perforated Duodenal ulcer in Nov, s/p surgical repair. No new perforation on CT today, but ? If contributing to GI symptoms given very recent surgery, repair, and CT findings today. Med rec is pending, but I'm going to put him on IV protonix BID for the moment given that history.  Cholangiocarcinoma [facility]) S/p Whipple, radiation. Concern for reoccurrence given recent GI symptoms, LFT elevations ect. Scheduled for repeat ERCP tomorrow to go look for possible recurrence. Wouldn't want chemo if there was reoccurrence (Wants to be DNR, would want Hospice it sounds like).  See Dr. Rhea Belton note from yesterday where she discuses the full [place] discussion.  Hydronephrosis of right kidney Due to obstructing ureteral stone, ongoing for several months now (looks like identified in Oct at least). Seen by Urology as outpt: No plans for intervention per patient.  Too much else going on medically. L kidney appears to be functioning normally still: creat 0.9 today, stable.  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 SSI sensitive scale Q4H for the moment.       Procedures: *  Consultations: *  Discharge Exam: BP (!) 179/89   Pulse 69   Temp 98.1 F (36.7 C) (Oral)   Resp 15   SpO2 97%   General: * Cardiovascular: * Respiratory: *     Allergies as of 02/22/2022       Reactions   Quetiapine Other (See Comments)   "Made me sleep TOO MUCH"  Allergies  Allergen Reactions   Quetiapine Other (See Comments)    "Made me sleep TOO MUCH"      The results of significant diagnostics from this hospitalization (including imaging, microbiology, ancillary and laboratory) are listed below for reference.    Significant Diagnostic Studies: CT ABDOMEN PELVIS W CONTRAST  Result Date: 02/21/2022 CLINICAL DATA:  Acute abdominal pain, history of prior Whipple procedure and perforated gastric ulcer repair, initial  encounter EXAM: CT ABDOMEN AND PELVIS WITH CONTRAST TECHNIQUE: Multidetector CT imaging of the abdomen and pelvis was performed using the standard protocol following bolus administration of intravenous contrast. RADIATION DOSE REDUCTION: This exam was performed according to the departmental dose-optimization program which includes automated exposure control, adjustment of the mA and/or kV according to patient size and/or use of iterative reconstruction technique. CONTRAST:  119m OMNIPAQUE IOHEXOL 300 MG/ML  SOLN COMPARISON:  01/18/2022 PET-CT FINDINGS: Lower chest: Scarring is noted in the right lower lobe and right middle lobe stable in appearance from the prior exam. No sizable effusion is seen. Hepatobiliary: Liver shows changes of pneumobilia as well as mild biliary ductal dilatation low to that seen on prior exams. No discrete hepatic mass is seen. The gallbladder has been surgically removed. Pancreas: There are changes consistent with the prior Whipple procedure. The body and tail are atrophic in nature. Spleen: Normal in size without focal abnormality. Adrenals/Urinary Tract: Adrenal glands are within normal limits. Kidneys demonstrate a normal enhancement pattern bilaterally. Tiny nonobstructing renal calculus is noted on the left. Normal excretion is noted bilaterally. Fullness of the right renal collecting system is noted secondary to a proximal ureteral stone just below the right UPJ. The stone measures approximately 6-7 mm and is stable in appearance. The degree of dilatation of the collecting system has increased slightly in the interval from the prior exam. Distal ureters are within normal limits. The bladder is well distended. Stomach/Bowel: Scattered fecal material is noted throughout the colon. No obstructive changes are seen. No obstructive changes of the small bowel are seen. The appendix is not well visualized. No inflammatory changes to suggest appendicitis are noted. The stomach is  significantly distended with ingested material which may represent a degree of gastroparesis. No obstructive change is seen. Vascular/Lymphatic: Aortic atherosclerosis. No enlarged abdominal or pelvic lymph nodes. Reproductive: Prostate is unremarkable. Other: Free fluid is noted within the abdomen adjacent to the liver and spleen. This has increased somewhat in the interval from the prior exam. No free air is noted. Musculoskeletal: Degenerative changes of the lumbar spine are noted. IMPRESSION: Changes consistent with the known history of prior Whipple procedure. A 7 mm proximal right ureteral stone with evidence of right-sided hydronephrosis. The hydronephrosis has increased slightly in the interval from the prior exam. Significant dilation of the stomach with ingested food stuffs. No ulcer is seen. No definitive obstructive changes are noted. Correlate with patient's history of last meal. This may represent a generalized gastroparesis. Ascites which is slightly increased when compared with the prior study. Stable mild biliary ductal dilatation and pneumobilia. Electronically Signed   By: MInez CatalinaM.D.   On: 02/21/2022 22:03    Microbiology: No results found for this or any previous visit (from the past 240 hour(s)).   Labs: Basic Metabolic Panel: Recent Labs  Lab 02/21/22 0845 02/21/22 2024 02/21/22 2049 02/22/22 0456  NA 141 140 143 138  K 3.4* 2.9* 3.1* 3.0*  CL 104 105 104 106  CO2 32 25  --  27  GLUCOSE 117* 134*  142* 110*  BUN _0 CREATININE 0.68 0.62 0.90 0.45*  CALCIUM 8.2* 8.1*  --  7.6*   Liver Function Tests: Recent Labs  Lab 02/21/22 0845 02/21/22 2024 02/22/22 0456  AST 91* 94* 77*  ALT 62* 65* 54*  ALKPHOS 1,756* 1,586* 1,269*  BILITOT 4.3* 4.6* 4.4*  PROT 5.6* 6.6 5.6*  ALBUMIN 2.9* 2.7* 2.3*   Recent Labs  Lab 02/21/22 2024  LIPASE 21   No results for input(s): "AMMONIA" in the last 168 hours. CBC: Recent Labs  Lab 02/21/22 0845  02/21/22 2024 02/21/22 2049 02/22/22 0456  WBC 7.0 7.5  --  7.1  NEUTROABS 6.2  --   --   --   HGB 10.8* 11.5* 11.9* 10.6*  HCT 31.7* 35.1* 35.0* 32.0*  MCV 99.7 102.6*  --  101.3*  PLT 204 209  --  174   Cardiac Enzymes: No results for input(s): "CKTOTAL", "CKMB", "CKMBINDEX", "TROPONINI" in the last 168 hours. BNP: BNP (last 3 results) No results for input(s): "BNP" in the last 8760 hours.  ProBNP (last 3 results) No results for input(s): "PROBNP" in the last 8760 hours.  CBG: Recent Labs  Lab 02/22/22 0502 02/22/22 0739  GLUCAP 124* 103*    FURTHER DISCHARGE INSTRUCTIONS:   Get Medicines reviewed and adjusted: Please take all your medications with you for your next visit with your Primary MD   Laboratory/radiological data: Please request your Primary MD to go over all hospital tests and procedure/radiological results at the follow up, please ask your Primary MD to get all Hospital records sent to his/her office.   In some cases, they will be blood work, cultures and biopsy results pending at the time of your discharge. Please request that your primary care M.D. goes through all the records of your hospital data and follows up on these results.   Also Note the following: If you experience worsening of your admission symptoms, develop shortness of breath, life threatening emergency, suicidal or homicidal thoughts you must seek medical attention immediately by calling 911 or calling your MD immediately  if symptoms less severe.   You must read complete instructions/literature along with all the possible adverse reactions/side effects for all the Medicines you take and that have been prescribed to you. Take any new Medicines after you have completely understood and accpet all the possible adverse reactions/side effects.    Do not drive when taking Pain medications or sleeping medications (Benzodaizepines)   Do not take more than prescribed Pain, Sleep and Anxiety  Medications. It is not advisable to combine anxiety,sleep and pain medications without talking with your primary care practitioner   Special Instructions: If you have smoked or chewed Tobacco  in the last 2 yrs please stop smoking, stop any regular Alcohol  and or any Recreational drug use.   Wear Seat belts while driving.   Please note: You were cared for by a hospitalist during your hospital stay. Once you are discharged, your primary care physician will handle any further medical issues. Please note that NO REFILLS for any discharge medications will be authorized once you are discharged, as it is imperative that you return to your primary care physician (or establish a relationship with a primary care physician if you do not have one) for your post hospital discharge needs so that they can reassess your need for medications and monitor your lab values.     Signed:  Florencia Reasons MD, PhD, FACP  [facility] 02/22/2022, 11:15  AM    

## 2022-02-22 NOTE — Assessment & Plan Note (Addendum)
Transaminitis, bloating, and CT showing rather full appearing stomach. 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 NPO for the moment EDP spoke with Dr. Collene Mares, GI will consult on pt in hospital Conveniently pt already scheduled for repeat ERCP for diagnosis later tomorrow at [facility] with Dr. Benson Norway it looks like (would be useful in evaluating for BOTH of above issues / concerns). Morphine PRN pain Repeat CMP in AM IVF, replacing K

## 2022-02-22 NOTE — H&P (Signed)
History and Physical    Patient: [name] [name] DOB: 20-Nov-1955 DOA: 02/21/2022 DOS: the patient was seen and examined on 02/22/2022 PCP: Hoyt Koch, MD  Patient coming from: Home  Chief Complaint:  Chief Complaint  Patient presents with   Weakness   Abdominal Pain   HPI: [name] [name] is a 67 y.o. male with medical history significant of DM2, HTN, EtOH abuse in remission.  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  Pt also with h/o perforated duodenal ulcer s/p exlap and repair in 12/2021.  Pt in to ED with abd pain and bloating.  Decreased BMs and nausea over past several days.  Pt with lab work and office visit with Dr. Krista Blue 1/3.  Noted to have new onset Jaundice as well as up trending CA 19-9 somewhat concerning for possible recurrence.  Also had PET scan on 01/18/2022 that showed multiple foci of RUQ hypermetabolism but this is non-specific given that he was ~2 weeks out from exlap for perforated duodenal ulcer at the time.  Plan was made to repeat EGD / ERCP to evaluate for / exclude duct recurrence hen he recovered from surgery.  Pt actually scheduled with Dr. Benson Norway for ERCP tomorrow 1/5.   Review of Systems: As mentioned in the history of present illness. All other systems reviewed and are negative. Past Medical History:  Diagnosis Date   Alcoholism [facility])    pt has not drank since 11/2019   Anxiety    Bilateral carotid artery stenosis    Bipolar disorder ([place])    Cancer ([place]) 2022   bile duct   COPD (chronic obstructive pulmonary disease) ([place])    Depression    " post traumatic stress disorder" sees MD in [place] every 3-6 months.   Diabetes mellitus without complication ([place])    type 2   Dysrhythmia    hx. Bundle branch block- saw Dr. Kathlee Nations   GERD (gastroesophageal reflux disease)    Hypertension    Irregular heart rate    RBBB    Sleep apnea    cpap used with nose clip- Dr.  Baird Lyons follows   Substance abuse [facility])    "tends to self medicate(Rx. meds or Alcohol) trying to get relief from "PSTD"   Past Surgical History:  Procedure Laterality Date   BILIARY BRUSHING  09/23/2020   Procedure: BILIARY BRUSHING;  Surgeon: Carol Ada, MD;  Location: [facility];  Service: Endoscopy;;   BILIARY STENT PLACEMENT N/A 09/23/2020   Procedure: BILIARY STENT PLACEMENT;  Surgeon: Carol Ada, MD;  Location: [facility];  Service: Endoscopy;  Laterality: N/A;   CHOLECYSTECTOMY     removed during whipple procedure   COLONOSCOPY WITH PROPOFOL N/A 01/19/2014   Procedure: COLONOSCOPY WITH PROPOFOL;  Surgeon: Juanita Craver, MD;  Location: [facility];  Service: Endoscopy;  Laterality: N/A;   ERCP N/A 09/23/2020   Procedure: ENDOSCOPIC RETROGRADE CHOLANGIOPANCREATOGRAPHY (ERCP);  Surgeon: Carol Ada, MD;  Location: [facility];  Service: Endoscopy;  Laterality: N/A;   exploratory laparotomy N/A 01/03/2022   s/p graham patch for perforation at [place] N/A 10/02/2021   Procedure: open right inguinal hernia repair with mesh;  Surgeon: Dwan Bolt, MD;  Location: [place];  Service: General;  Laterality: N/A;   LAPAROSCOPY N/A 10/31/2020   Procedure: Manfred Shirts LAPAROSCOPY;  Surgeon: Dwan Bolt, MD;  Location: [place];  Service: General;  Laterality: N/A;  GEN AND TAP BLOCK  LAPAROTOMY N/A 01/03/2022   Procedure: EXPLORATORY LAPAROTOMY, GRAHAM PATCH REPAIR OF PERFORATED ULCER;  Surgeon: Donnie Mesa, MD;  Location: [facility];  Service: General;  Laterality: N/A;   SPHINCTEROTOMY  09/23/2020   Procedure: Joan Mayans;  Surgeon: Carol Ada, MD;  Location: [facility];  Service: Endoscopy;;   WHIPPLE PROCEDURE N/A 10/31/2020   Procedure: WHIPPLE PROCEDURE, INTRAOPERATIVE ULTRASOUND;  Surgeon: Dwan Bolt, MD;  Location: [place];  Service: General;  Laterality: N/A;   WISDOM TOOTH EXTRACTION     67 years old   Social History:  reports that he  has been smoking cigarettes. He started smoking about 4 years ago. He has a 45.00 pack-year smoking history. He has never used smokeless tobacco. He reports that he does not currently use alcohol. He reports that he does not use drugs.  Allergies  Allergen Reactions   Quetiapine Other (See Comments)    "Made me sleep TOO MUCH"    Family History  Problem Relation Age of Onset   Diabetes Father    Heart Problems Father    Heart disease Father    Mental illness Father     Prior to Admission medications   Medication Sig Start Date End Date Taking? Authorizing Provider  albuterol (VENTOLIN HFA) 108 (90 Base) MCG/ACT inhaler Inhale 1-2 puffs into the lungs every 6 (six) hours as needed for wheezing or shortness of breath.    [provider]  aspirin EC 81 MG tablet Take 81 mg by mouth in the morning. Swallow whole.    [provider]  busPIRone (BUSPAR) 5 MG tablet Take 5 mg by mouth at bedtime. 07/01/20   [provider]  diphenoxylate-atropine (LOMOTIL) 2.5-0.025 MG tablet Take 2 tablets by mouth in the morning and at bedtime. 02/21/22   [name], MD  MILK THISTLE PO Take 1 capsule by mouth daily with breakfast.    [provider]  Multiple Vitamin (MULTIVITAMIN WITH MINERALS) TABS tablet Take 1 tablet by mouth daily with breakfast.    [provider]  neomycin-polymyxin-hydrocortisone (CORTISPORIN) OTIC solution Place 3 drops into the left ear 3 (three) times daily. 01/31/22   Hoyt Koch, MD  PANCREAZE [phone] units CPEP Take 4,200 units of lipase by mouth 2 (two) times daily.    [provider]  Pancrelipase, Lip-Prot-Amyl, [phone] units CPEP Take 4,200 units of lipase by mouth 3 times daily with meals, bedtime and 2 AM. Take 2 pills by mouth with Breakfast, Lunch, and Diner (large meals).  Take 1 pill by mouth with snacks (2 snacks/day) 01/02/22   [name], MD  PARoxetine (PAXIL) 30 MG tablet Take 60 mg by mouth at  bedtime.    [provider]  Probiotic Product (PROBIOTIC PO) Take 1 capsule by mouth in the morning.    [provider]  SYSTANE 0.4-0.3 % GEL ophthalmic gel Place 1 Application into both eyes in the morning.    [provider]  SYSTANE COMPLETE PF 0.6 % SOLN Place 1 drop into both eyes See admin instructions. Instill 1 drop into both eyes 3-4 times a day    [provider]  tamsulosin (FLOMAX) 0.4 MG CAPS capsule Take 0.4 mg by mouth daily.    [provider]  tiaGABine (GABITRIL) 4 MG tablet Take 8 mg by mouth at bedtime.    [provider]    Physical Exam: Vitals:   02/22/22 0000 02/22/22 0330 02/22/22 0349 02/22/22 0400  BP: 139/89 (!) 162/96  (!) 170/107  Pulse: 96 78  89  Resp: _0 Temp: 97.6 F (36.4 C)  97.7 F (36.5 C)   TempSrc: Oral  Oral   SpO2: 94% 97%  100%   Constitutional: NAD, calm, comfortable Eyes: PERRL, lids and conjunctivae icteric ENMT: Mucous membranes are moist. Posterior pharynx clear of any exudate or lesions.Normal dentition.  Neck: normal, supple, no masses, no thyromegaly Respiratory: clear to auscultation bilaterally, no wheezing, no crackles. Normal respiratory effort. No accessory muscle use.  Cardiovascular: Regular rate and rhythm, no murmurs / rubs / gallops. No extremity edema. 2+ pedal pulses. No carotid bruits.  Abdomen: no tenderness, no masses palpated. No hepatosplenomegaly. Bowel sounds positive.  Musculoskeletal: no clubbing / cyanosis. No joint deformity upper and lower extremities. Good ROM, no contractures. Normal muscle tone.  Skin: no rashes, lesions, ulcers. No induration, Jaundiced Neurologic: CN 2-12 grossly intact. Sensation intact, DTR normal. Strength 5/5 in all 4.  Psychiatric: Normal judgment and insight. Alert and oriented x 3. Normal mood.   Data Reviewed:     Assessment and Plan: * Transaminitis Transaminitis, bloating, and CT showing rather full appearing  stomach. 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 NPO for the moment EDP spoke with Dr. Collene Mares, GI will consult on pt in hospital Conveniently pt already scheduled for repeat ERCP for diagnosis later tomorrow at [facility] with Dr. Benson Norway it looks like (would be useful in evaluating for BOTH of above issues / concerns). Morphine PRN pain Repeat CMP in AM IVF, replacing K  History of duodenal ulcer H/o perforated Duodenal ulcer in Nov, s/p surgical repair. No new perforation on CT today, but ? If contributing to GI symptoms given very recent surgery, repair, and CT findings today. Med rec is pending, but I'm going to put him on IV protonix BID for the moment given that history.  Cholangiocarcinoma [facility]) S/p Whipple, radiation. Concern for reoccurrence given recent GI symptoms, LFT elevations ect. Scheduled for repeat ERCP tomorrow to go look for possible recurrence. Wouldn't want chemo if there was reoccurrence (Wants to be DNR, would want Hospice it sounds like).  See Dr. Rhea Belton note from yesterday where she discuses the full [place] discussion.  Hydronephrosis of right kidney Due to obstructing ureteral stone, ongoing for several months now (looks like identified in Oct at least). Seen by Urology as outpt: No plans for intervention per patient.  Too much else going on medically. L kidney appears to be functioning normally still: creat 0.9 today, stable.  Diabetes mellitus type 2 with complications ([place]) Doesn't appear to be on any home meds for this. SSI sensitive scale Q4H for the moment.      Advance Care Planning:   Code Status: DNR See Dr. Rhea Belton extensive [place] discussion from just yesterday 1/3.  Consults: EDP  spoke with Dr. Collene Mares  Family Communication: No family in room  Severity of Illness: The appropriate patient status for this patient is OBSERVATION. Observation status is judged to be reasonable and necessary in order to provide the required intensity of service to ensure the patient's safety. The patient's presenting symptoms, physical exam findings, and initial radiographic and laboratory data in the context of their medical condition is felt to place them at decreased risk  for further clinical deterioration. Furthermore, it is anticipated that the patient will be medically stable for discharge from the hospital within 2 midnights of admission.   Author: Etta Quill., DO 02/22/2022 4:36 AM  For on call review [email or web address].

## 2022-02-22 NOTE — Assessment & Plan Note (Signed)
Doesn't appear to be on any home meds for this. SSI sensitive scale Q4H for the moment.

## 2022-02-22 NOTE — Assessment & Plan Note (Addendum)
Due to obstructing ureteral stone, ongoing for several months now (looks like identified in Oct at least). Seen by Urology as outpt: No plans for intervention per patient.  Too much else going on medically. L kidney appears to be functioning normally still: creat 0.9 today, stable.

## 2022-02-22 NOTE — ED Notes (Signed)
Pt had a large loose stool, cleaned and repositioned in bed

## 2022-02-22 NOTE — Assessment & Plan Note (Addendum)
S/p Whipple, radiation. Concern for reoccurrence given recent GI symptoms, LFT elevations ect. Scheduled for repeat ERCP tomorrow to go look for possible recurrence. Wouldn't want chemo if there was reoccurrence (Wants to be DNR, would want Hospice it sounds like).  See Dr. Rhea Belton note from yesterday where she discuses the full [place] discussion.

## 2022-02-22 NOTE — ED Notes (Addendum)
Pt requested this nurse call his wife and have her come to hospital to be with him. Talked to wife and she is on the way

## 2022-02-23 ENCOUNTER — Ambulatory Visit (HOSPITAL_COMMUNITY)
Admission: RE | Admit: 2022-02-23 | Discharge: 2022-02-23 | Disposition: A | Discharge status: Home / Self Care | Payer: Federal, State, Local not specified - PPO | Source: Ambulatory Visit | Attending: Hematology | Admitting: Hematology

## 2022-02-23 ENCOUNTER — Ambulatory Visit (HOSPITAL_COMMUNITY): Payer: Federal, State, Local not specified - PPO | Admitting: Certified Registered"

## 2022-02-23 ENCOUNTER — Ambulatory Visit (HOSPITAL_COMMUNITY): Payer: Federal, State, Local not specified - PPO

## 2022-02-23 ENCOUNTER — Encounter (HOSPITAL_COMMUNITY): Payer: Self-pay | Admitting: Gastroenterology

## 2022-02-23 ENCOUNTER — Other Ambulatory Visit: Payer: Self-pay

## 2022-02-23 ENCOUNTER — Ambulatory Visit (HOSPITAL_COMMUNITY)
Admission: RE | Admit: 2022-02-23 | Payer: Federal, State, Local not specified - PPO | Source: Ambulatory Visit | Admitting: Gastroenterology

## 2022-02-23 ENCOUNTER — Encounter (HOSPITAL_COMMUNITY)
Admission: RE | Disposition: A | Discharge status: Home / Self Care | Payer: Self-pay | Source: Home / Self Care | Attending: Gastroenterology

## 2022-02-23 ENCOUNTER — Ambulatory Visit (HOSPITAL_COMMUNITY)
Admission: RE | Admit: 2022-02-23 | Discharge: 2022-02-23 | Disposition: A | Discharge status: Home / Self Care | Payer: Federal, State, Local not specified - PPO | Attending: Gastroenterology | Admitting: Gastroenterology

## 2022-02-23 DIAGNOSIS — C240 Malignant neoplasm of extrahepatic bile duct: Secondary | ICD-10-CM | POA: Insufficient documentation

## 2022-02-23 DIAGNOSIS — R748 Abnormal levels of other serum enzymes: Secondary | ICD-10-CM | POA: Insufficient documentation

## 2022-02-23 DIAGNOSIS — C249 Malignant neoplasm of biliary tract, unspecified: Secondary | ICD-10-CM | POA: Diagnosis not present

## 2022-02-23 DIAGNOSIS — R17 Unspecified jaundice: Secondary | ICD-10-CM | POA: Diagnosis present

## 2022-02-23 DIAGNOSIS — G4733 Obstructive sleep apnea (adult) (pediatric): Secondary | ICD-10-CM

## 2022-02-23 DIAGNOSIS — Z9989 Dependence on other enabling machines and devices: Secondary | ICD-10-CM

## 2022-02-23 DIAGNOSIS — K831 Obstruction of bile duct: Secondary | ICD-10-CM | POA: Insufficient documentation

## 2022-02-23 HISTORY — PX: BILIARY BRUSHING: SHX6843

## 2022-02-23 HISTORY — PX: BILIARY STENT PLACEMENT: SHX5538

## 2022-02-23 HISTORY — PX: ENDOSCOPIC RETROGRADE CHOLANGIOPANCREATOGRAPHY (ERCP) WITH PROPOFOL: SHX5810

## 2022-02-23 SURGERY — ENDOSCOPIC RETROGRADE CHOLANGIOPANCREATOGRAPHY (ERCP) WITH PROPOFOL
Anesthesia: General

## 2022-02-23 MED ORDER — DICLOFENAC SUPPOSITORY 100 MG
RECTAL | Status: AC
  Filled 2022-02-23: qty 1

## 2022-02-23 MED ORDER — DEXAMETHASONE SODIUM PHOSPHATE 10 MG/ML IJ SOLN
INTRAMUSCULAR | Status: DC | PRN
  Administered 2022-02-23: 5 mg via INTRAVENOUS

## 2022-02-23 MED ORDER — PROPOFOL 10 MG/ML IV BOLUS
INTRAVENOUS | Status: DC | PRN
  Administered 2022-02-23: 140 mg via INTRAVENOUS

## 2022-02-23 MED ORDER — CIPROFLOXACIN IN D5W 400 MG/200ML IV SOLN
INTRAVENOUS | Status: AC
  Filled 2022-02-23: qty 200

## 2022-02-23 MED ORDER — PHENYLEPHRINE HCL-NACL 20-0.9 MG/250ML-% IV SOLN
INTRAVENOUS | Status: DC | PRN
  Administered 2022-02-23: 25 ug/min via INTRAVENOUS

## 2022-02-23 MED ORDER — CIPROFLOXACIN IN D5W 400 MG/200ML IV SOLN
INTRAVENOUS | Status: DC | PRN
  Administered 2022-02-23: 400 mg via INTRAVENOUS

## 2022-02-23 MED ORDER — FENTANYL CITRATE (PF) 100 MCG/2ML IJ SOLN
INTRAMUSCULAR | Status: DC | PRN
  Administered 2022-02-23 (×2): 50 ug via INTRAVENOUS

## 2022-02-23 MED ORDER — GLUCAGON HCL RDNA (DIAGNOSTIC) 1 MG IJ SOLR
INTRAMUSCULAR | Status: AC
  Filled 2022-02-23: qty 1

## 2022-02-23 MED ORDER — GLUCAGON HCL RDNA (DIAGNOSTIC) 1 MG IJ SOLR
INTRAMUSCULAR | Status: AC
  Filled 2022-02-23: qty 2

## 2022-02-23 MED ORDER — LACTATED RINGERS IV SOLN: INTRAVENOUS | Status: DC

## 2022-02-23 MED ORDER — LIDOCAINE 2% (20 MG/ML) 5 ML SYRINGE
INTRAMUSCULAR | Status: DC | PRN
  Administered 2022-02-23: 80 mg via INTRAVENOUS

## 2022-02-23 MED ORDER — SUCCINYLCHOLINE CHLORIDE 200 MG/10ML IV SOSY
PREFILLED_SYRINGE | INTRAVENOUS | Status: DC | PRN
  Administered 2022-02-23: 100 mg via INTRAVENOUS

## 2022-02-23 MED ORDER — PHENYLEPHRINE HCL (PRESSORS) 10 MG/ML IV SOLN
INTRAVENOUS | Status: AC
  Filled 2022-02-23: qty 1

## 2022-02-23 MED ORDER — ONDANSETRON HCL 4 MG/2ML IJ SOLN
INTRAMUSCULAR | Status: DC | PRN
  Administered 2022-02-23: 4 mg via INTRAVENOUS

## 2022-02-23 MED ORDER — INDOMETHACIN 50 MG RE SUPP
RECTAL | Status: AC
  Filled 2022-02-23: qty 2

## 2022-02-23 MED ORDER — SODIUM CHLORIDE 0.9 % IV SOLN
INTRAVENOUS | Status: DC | PRN
  Administered 2022-02-23: 25 mL

## 2022-02-23 MED ORDER — LACTATED RINGERS IV SOLN: INTRAVENOUS | Status: DC | PRN

## 2022-02-23 MED ORDER — FENTANYL CITRATE (PF) 100 MCG/2ML IJ SOLN
INTRAMUSCULAR | Status: AC
  Filled 2022-02-23: qty 2

## 2022-02-23 MED ORDER — SODIUM CHLORIDE 0.9 % IV SOLN: INTRAVENOUS | Status: DC

## 2022-02-23 NOTE — H&P (Signed)
[name] [name] HPI: This is a 67 year old male s/p Whipple's for a cholangiocarcinoma who is here today for rising AP, TB, and CA 19-19.  His most recent labs are as follows:  AST 77, ALT 54, AP 1269, TB 4.4, and CA 19-9 2156.  The patient's CT scan shows the expected pneumobilia, but there is no worsening of the bilary ductal dilation.  His CT scan was positive for significant gastric content retention.  He was hospitalized in November last year for a perforated gastrojejunal anastomosis ulcer.  He is here today for further investigate the source of the worsening liver enzymes and the gastric distension.  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   Diabetes mellitus without complication ([place])    type 2   Dysrhythmia    hx. Bundle branch block- saw Dr. Kathlee Nations   GERD (gastroesophageal reflux disease)    Hypertension    Irregular heart rate    RBBB    Sleep apnea    cpap used with nose clip- Dr. Baird Lyons follows   Substance abuse [facility])    "tends to self medicate(Rx. meds or Alcohol) trying to get relief from "PSTD"    Past Surgical History:  Procedure Laterality Date   BILIARY BRUSHING  09/23/2020   Procedure: BILIARY BRUSHING;  Surgeon: Carol Ada, MD;  Location[facility];  Service: Endoscopy;;   BILIARY STENT PLACEMENT N/A 09/23/2020   Procedure: BILIARY STENT PLACEMENT;  Surgeon: Carol Ada, MD;  Location: [facility];  Service: Endoscopy;  Laterality: N/A;   CHOLECYSTECTOMY     removed during whipple procedure   COLONOSCOPY WITH PROPOFOL N/A 01/19/2014   Procedure: COLONOSCOPY WITH PROPOFOL;  Surgeon: Juanita Craver, MD;  Location: [facility];  Service:  Endoscopy;  Laterality: N/A;   ERCP N/A 09/23/2020   Procedure: ENDOSCOPIC RETROGRADE CHOLANGIOPANCREATOGRAPHY (ERCP);  Surgeon: Carol Ada, MD;  Location: [facility];  Service: Endoscopy;  Laterality: N/A;   exploratory laparotomy N/A 01/03/2022   s/p graham patch for perforation at [place] N/A 10/02/2021   Procedure: open right inguinal hernia repair with mesh;  Surgeon: Dwan Bolt, MD;  Location: [place];  Service: General;  Laterality: N/A;   LAPAROSCOPY N/A 10/31/2020   Procedure: Manfred Shirts LAPAROSCOPY;  Surgeon: Dwan Bolt, MD;  Location: [place];  Service: General;  Laterality: N/A;  GEN AND TAP BLOCK   LAPAROTOMY N/A 01/03/2022   Procedure: EXPLORATORY LAPAROTOMY, GRAHAM PATCH REPAIR OF PERFORATED ULCER;  Surgeon: Donnie Mesa, MD;  Location[facility];  Service: General;  Laterality: N/A;   SPHINCTEROTOMY  09/23/2020   Procedure: Joan Mayans;  Surgeon: Carol Ada, MD;  [facility];  Service: Endoscopy;;   WHIPPLE PROCEDURE N/A 10/31/2020   Procedure: WHIPPLE PROCEDURE, INTRAOPERATIVE ULTRASOUND;  Surgeon: Dwan Bolt, MD;  Location: [place];  Service: General;  Laterality: N/A;   WISDOM TOOTH EXTRACTION     67 years old    Family History  Problem Relation Age of Onset   Diabetes Father    Heart Problems Father    Heart disease Father    Mental illness  Father     Social History:  reports that he has been smoking cigarettes. He started smoking about 4 years ago. He has a 45.00 pack-year smoking history. He has never used smokeless tobacco. He reports that he does not currently use alcohol. He reports that he does not use drugs.  Allergies:  Allergies  Allergen Reactions   Quetiapine Other (See Comments)    "Made me sleep TOO MUCH"    Medications: Scheduled: Continuous:  sodium chloride     lactated ringers 10 mL/hr at 02/23/22 1048    No results found for this or any previous visit (from the past 24 hour(s)).   US  Abdomen Complete  Result Date: 02/23/2022 CLINICAL DATA:  Hyperbilirubinemia History of cholangiocarcinoma EXAM: ABDOMEN ULTRASOUND COMPLETE COMPARISON:  CT abdomen pelvis with contrast 02/21/2022 FINDINGS: Gallbladder: Surgically absent Common bile duct: Diameter: 4 mm Liver: Minimal perihepatic ascites. Echogenic foci within the liver consistent with pneumobilia seen on recent CT. 2.8 x 2.7 x 1.9 cm hypoechoic masslike region noted within the right hepatic lobe. Portal vein is patent on color Doppler imaging with normal direction of blood flow towards the liver. IVC: No abnormality visualized. Pancreas: Not well visualized due to shadowing bowel gas. Spleen: Size and appearance within normal limits. Right Kidney: Length: 12.1 cm. Unchanged mild hydronephrosis. Normal cortical thickness and echogenicity. 6 mm calculus noted within the lower pole. Left Kidney: Length: 13.0 cm. Echogenicity within normal limits. No mass or hydronephrosis visualized. 1.2 cm simple cyst in the lower pole does not require further imaging follow-up. Abdominal aorta: No aneurysm visualized. Other findings: None. IMPRESSION: 1. Unchanged mild right hydronephrosis. 2. 2.8 cm hypoechoic masslike region within the right hepatic lobe is indeterminate. Further evaluation with contrast-enhanced abdominal MRI should be performed, preferably on outpatient basis with the patient is better able to follow breathing instructions. 3. Unchanged pneumobilia. 4. Unchanged minimal perihepatic ascites. Electronically Signed   By: Miachel Roux M.D.   On: 02/23/2022 07:59   CT ABDOMEN PELVIS W CONTRAST  Result Date: 02/21/2022 CLINICAL DATA:  Acute abdominal pain, history of prior Whipple procedure and perforated gastric ulcer repair, initial encounter EXAM: CT ABDOMEN AND PELVIS WITH CONTRAST TECHNIQUE: Multidetector CT imaging of the abdomen and pelvis was performed using the standard protocol following bolus administration of intravenous contrast.  RADIATION DOSE REDUCTION: This exam was performed according to the departmental dose-optimization program which includes automated exposure control, adjustment of the mA and/or kV according to patient size and/or use of iterative reconstruction technique. CONTRAST:  117m OMNIPAQUE IOHEXOL 300 MG/ML  SOLN COMPARISON:  01/18/2022 PET-CT FINDINGS: Lower chest: Scarring is noted in the right lower lobe and right middle lobe stable in appearance from the prior exam. No sizable effusion is seen. Hepatobiliary: Liver shows changes of pneumobilia as well as mild biliary ductal dilatation low to that seen on prior exams. No discrete hepatic mass is seen. The gallbladder has been surgically removed. Pancreas: There are changes consistent with the prior Whipple procedure. The body and tail are atrophic in nature. Spleen: Normal in size without focal abnormality. Adrenals/Urinary Tract: Adrenal glands are within normal limits. Kidneys demonstrate a normal enhancement pattern bilaterally. Tiny nonobstructing renal calculus is noted on the left. Normal excretion is noted bilaterally. Fullness of the right renal collecting system is noted secondary to a proximal ureteral stone just below the right UPJ. The stone measures approximately 6-7 mm and is stable in appearance. The degree of dilatation of the collecting system has increased slightly in the  interval from the prior exam. Distal ureters are within normal limits. The bladder is well distended. Stomach/Bowel: Scattered fecal material is noted throughout the colon. No obstructive changes are seen. No obstructive changes of the small bowel are seen. The appendix is not well visualized. No inflammatory changes to suggest appendicitis are noted. The stomach is significantly distended with ingested material which may represent a degree of gastroparesis. No obstructive change is seen. Vascular/Lymphatic: Aortic atherosclerosis. No enlarged abdominal or pelvic lymph nodes.  Reproductive: Prostate is unremarkable. Other: Free fluid is noted within the abdomen adjacent to the liver and spleen. This has increased somewhat in the interval from the prior exam. No free air is noted. Musculoskeletal: Degenerative changes of the lumbar spine are noted. IMPRESSION: Changes consistent with the known history of prior Whipple procedure. A 7 mm proximal right ureteral stone with evidence of right-sided hydronephrosis. The hydronephrosis has increased slightly in the interval from the prior exam. Significant dilation of the stomach with ingested food stuffs. No ulcer is seen. No definitive obstructive changes are noted. Correlate with patient's history of last meal. This may represent a generalized gastroparesis. Ascites which is slightly increased when compared with the prior study. Stable mild biliary ductal dilatation and pneumobilia. Electronically Signed   By: Inez Catalina M.D.   On: 02/21/2022 22:03    ROS:  As stated above in the HPI otherwise negative.  Blood pressure (!) 151/77, pulse 65, temperature 97.9 F (36.6 C), temperature source Temporal, resp. rate 16, height '5\' 10"'$  (1.778 m), weight 68 kg, SpO2 100 %.    PE: Gen: NAD, Alert and Oriented HEENT:  [place]/AT, EOMI Neck: Supple, no LAD Lungs: CTA Bilaterally CV: RRR without M/G/R ABD: Soft, NTND, +BS Ext: No C/C/E  Assessment/Plan: 1) History of cholangiocarcinoma with a concern for recurrence s/p Whipple's. 2) Abnormal liver enzymes. 3) Abnormal CT scan.   The patient's ERCP will be challenging.  It is unclear why he has a significant amount of gastric content retention.  It may be that he has a GOO at the gastrojejunal anastomosis.  Once this area is interrogated it will be challenging to determine the direction of the afferent lumen to access biliary anastomosis.  These details were explained to the patient and he understands.  Plan: 1) ERCP today.  Kinan Safley D 02/23/2022, 11:04 AM

## 2022-02-23 NOTE — Anesthesia Preprocedure Evaluation (Addendum)
Anesthesia Evaluation  Patient identified by MRN, date of birth, ID band Patient awake    Reviewed: Allergy & Precautions, NPO status , Patient's Chart, lab work & pertinent test results  Airway Mallampati: II  TM Distance: >3 FB Neck ROM: Full    Dental  (+) Teeth Intact, Dental Advisory Given   Pulmonary sleep apnea and Continuous Positive Airway Pressure Ventilation , COPD, Current SmokerPatient did not abstain from smoking.   Pulmonary exam normal breath sounds clear to auscultation       Cardiovascular hypertension, + Peripheral Vascular Disease (B/L CAS)  Normal cardiovascular exam+ dysrhythmias (RBBB)  Rhythm:Regular Rate:Normal     Neuro/Psych  PSYCHIATRIC DISORDERS Anxiety Depression Bipolar Disorder   negative neurological ROS     GI/Hepatic PUD,GERD  ,,(+)     substance abuse  alcohol useH/o cholangiocarcinoma s/p whipple procedure 10/2020 as well as radiation therapy h/o perforated duodenal ulcer s/p exlap and repair in 12/2021   Endo/Other  diabetes, Type 2    Renal/GU Renal disease     Musculoskeletal negative musculoskeletal ROS (+)    Abdominal   Peds  Hematology  (+) Blood dyscrasia, anemia   Anesthesia Other Findings Day of surgery medications reviewed with the patient.  Reproductive/Obstetrics                             Anesthesia Physical Anesthesia Plan  ASA: 3  Anesthesia Plan: General   Post-op Pain Management: Minimal or no pain anticipated   Induction: Intravenous  PONV Risk Score and Plan: 1 and Ondansetron and Dexamethasone  Airway Management Planned: Oral ETT  Additional Equipment:   Intra-op Plan:   Post-operative Plan: Extubation in OR  Informed Consent: I have reviewed the patients History and Physical, chart, labs and discussed the procedure including the risks, benefits and alternatives for the proposed anesthesia with the patient or  authorized representative who has indicated his/her understanding and acceptance.     Dental advisory given  Plan Discussed with: CRNA  Anesthesia Plan Comments:         Anesthesia Quick Evaluation

## 2022-02-23 NOTE — Anesthesia Procedure Notes (Signed)
Date/Time: 02/23/2022 12:41 PM  Performed by: Cynda Familia, CRNAOxygen Delivery Method: Simple face mask Placement Confirmation: breath sounds checked- equal and bilateral and positive ETCO2 Dental Injury: Teeth and Oropharynx as per pre-operative assessment

## 2022-02-23 NOTE — Anesthesia Procedure Notes (Signed)
Procedure Name: Intubation Date/Time: 02/23/2022 11:22 AM  Performed by: Cynda Familia, CRNAPre-anesthesia Checklist: Patient identified, Emergency Drugs available, Suction available and Patient being monitored Patient Re-evaluated:Patient Re-evaluated prior to induction Oxygen Delivery Method: Circle System Utilized Preoxygenation: Pre-oxygenation with 100% oxygen Induction Type: IV induction Ventilation: Mask ventilation without difficulty Laryngoscope Size: Miller and 2 Grade View: Grade I Tube type: Oral Number of attempts: 1 Airway Equipment and Method: Stylet Placement Confirmation: ETT inserted through vocal cords under direct vision, positive ETCO2 and breath sounds checked- equal and bilateral Secured at: 23 cm Tube secured with: Tape Dental Injury: Teeth and Oropharynx as per pre-operative assessment  Comments: Smooth IV induction Turk-- intubation AM CRNA atraumatic--  missing teeth -- poor dentition-- teeth and mouth unchanged with intubation--- bilat BS

## 2022-02-23 NOTE — Transfer of Care (Signed)
Immediate Anesthesia Transfer of Care Note  Patient: [name] [name]  Procedure(s) Performed: ENDOSCOPIC RETROGRADE CHOLANGIOPANCREATOGRAPHY (ERCP) WITH PROPOFOL BILIARY BRUSHING BILIARY STENT PLACEMENT  Patient Location: PACU and Endoscopy Unit  Anesthesia Type:General  Level of Consciousness: awake  Airway & Oxygen Therapy: Patient Spontanous Breathing and Patient connected to face mask oxygen  Post-op Assessment: Report given to RN and Post -op Vital signs reviewed and stable  Post vital signs: Reviewed and stable  Last Vitals:  Vitals Value Taken Time  BP 143/74 02/23/22 1249  Temp    Pulse 74 02/23/22 1249  Resp 23 02/23/22 1249  SpO2 92 % 02/23/22 1249    Last Pain:  Vitals:   02/23/22 1037  TempSrc: Temporal  PainSc: 0-No pain         Complications: No notable events documented.

## 2022-02-23 NOTE — Op Note (Signed)
[facility] Patient Name: [name] [name] Procedure Date: 02/23/2022 MRN: [id number] Attending MD: Carol Ada , MD, [id number] Date of Birth: 12-03-1955 CSN: [id number] Age: 67 Admit Type: Inpatient Procedure:                ERCP Indications:              Jaundice, Elevated bilirubin, Elevated alkaline                            phosphatase Providers:                Carol Ada, MD, Helane Rima, RN, Carlyn Reichert, RN, Cletis [place], Technician Referring MD:              Medicines:                General Anesthesia Complications:            No immediate complications. Estimated Blood Loss:     Estimated blood loss: none. Procedure:                Pre-Anesthesia Assessment:                           - Prior to the procedure, a History and Physical                            was performed, and patient medications and                            allergies were reviewed. The patient's tolerance of                            previous anesthesia was also reviewed. The risks                            and benefits of the procedure and the sedation                            options and risks were discussed with the patient.                            All questions were answered, and informed consent                            was obtained. Prior Anticoagulants: The patient has                            taken no anticoagulant or antiplatelet agents. ASA                            Grade Assessment: III - A patient with severe                            systemic disease. After  reviewing the risks and                            benefits, the patient was deemed in satisfactory                            condition to undergo the procedure.                           - Sedation was administered by an anesthesia                            professional. General anesthesia was attained.                           After obtaining informed consent, the scope was                             passed under direct vision. Throughout the                            procedure, the patient's blood pressure, pulse, and                            oxygen saturations were monitored continuously. The                            CF-HQ190L ([id number]) Olympus colonoscope was                            introduced through the mouth, and used to inject                            contrast into and used to inject contrast into the                            bile duct. The [id number]-[id number] ([id number]) Olympus                            therapeutic endoscope was introduced through the                            and used to inject contrast into. The [id number]-[id number]                            ([id number]) Olympus duodenoscope was introduced                            through the and used to inject contrast into. The                            [id number]-[id number] ([id number]) Olympus endoscope was introduced  through the and used to inject contrast into. The                            ERCP was technically difficult and complex. The                            patient tolerated the procedure well. Scope In: Scope Out: Findings:      The bile duct was deeply cannulated with the sphincterotome. Contrast       was injected. I personally interpreted the bile duct images. There was       brisk flow of contrast through the ducts. Image quality was excellent.       Contrast extended to the hepatic ducts. The right and left intrahepatic       branches (but not the right or left hepatic ducts) were diffusely       dilated, with an obstruction. The largest diameter was 10 mm. A short       0.035 inch Soft Jagwire was passed into the biliary tree. Cells for       cytology were obtained by brushing in the left intrahepatic branches.       One 7 Fr by 5 cm plastic stent with a single external flap and a single       internal flap was placed 3 cm into the common bile duct. Bile flowed        through the stent. The stent was in good position.      The procedure was difficult to perform. The patient was in a supine       position. There was no evidence of any retained gastric contents,       however, the gastric lumen was dilated. The gastrojejunostomy was intact       and there was no evidence of stenosis. At this site there was evidence       of a clean-based ulceration, which is presumed to be from his recent       gastrojejunostomy ulcer perforation. The afferent limb was identified       with the passage of bile. The adult colonoscope was initially used and       the cholangiojejunostomy was identified and it appeared to be patent.       Because of the length of the colonoscope sphincterotome and wire       management were difficult. A standard adult endoscope was used, but then       it was changed to a therapeutic endoscope as the working channel in the       standard endoscope was too small. At the full length of the endoscope       the tip of the therapeutic endoscope was at the cholangiojejunostomy.       Two guidewire were used, but ultimately a straight sphincterotome with       the 0.035 guidewire was used. Contrast injection revealed a diffusely       dilated biliary tree, but there was no clear evidence of any focal       stricture or strictures. The guidewire preferentially passed into the       left hepatic ducts. Two separate brushes were used to sample the bile       ducts. Subsequently a 7 Fr x 5 cm plastic biliary stent was inserted.  Only 3 cm of the stent was able to be advanced into the biliary tree. Impression:               - The right and left intrahepatic branches (but not                            the right or left hepatic ducts) were dilated, with                            an obstruction.                           - Cells for cytology obtained in the left                            intrahepatic branches.                           - One plastic  stent was placed into the common bile                            duct. Moderate Sedation:      Not Applicable - Patient had care per Anesthesia. Recommendation:           - Patient has a contact number available for                            emergencies. The signs and symptoms of potential                            delayed complications were discussed with the                            patient. Return to normal activities tomorrow.                            Written discharge instructions were provided to the                            patient.                           - Await cytology results.                           - Return to GI clinic in 2 weeks.                           - PPI QD. Procedure Code(s):        --- Professional ---                           [phone], Endoscopic retrograde                            cholangiopancreatography (ERCP); with placement of  endoscopic stent into biliary or pancreatic duct,                            including pre- and post-dilation and guide wire                            passage, when performed, including sphincterotomy,                            when performed, each stent                           [id number], Endoscopic catheterization of the biliary                            ductal system, radiological supervision and                            interpretation Diagnosis Code(s):        --- Professional ---                           K83.1, Obstruction of bile duct                           R17, Unspecified jaundice                           E80.7, Disorder of bilirubin metabolism, unspecified                           R74.8, Abnormal levels of other serum enzymes CPT copyright 2022 American Medical Association. All rights reserved. The codes documented in this report are preliminary and upon coder review may  be revised to meet current compliance requirements. Carol Ada, MD Carol Ada, MD 02/23/2022 1:00:15  PM This report has been signed electronically. Number of Addenda: 0

## 2022-02-23 NOTE — Discharge Instructions (Signed)
YOU HAD AN ENDOSCOPIC PROCEDURE TODAY: Refer to the procedure report and other information in the discharge instructions given to you for any specific questions about what was found during the examination. If this information does not answer your questions, please call [facility] at [phone] to clarify.  ? ?YOU SHOULD EXPECT: Some feelings of bloating in the abdomen. Passage of more gas than usual. Walking can help get rid of the air that was put into your GI tract during the procedure and reduce the bloating. ? ?DIET: Your first meal following the procedure should be a light meal and then it is ok to progress to your normal diet. A half-sandwich or bowl of soup is an example of a good first meal. Heavy or fried foods are harder to digest and may make you feel nauseous or bloated. Drink plenty of fluids but you should avoid alcoholic beverages for 24 hours.  ? ?ACTIVITY: Your care partner should take you home directly after the procedure. You should plan to take it easy, moving slowly for the rest of the day. You can resume normal activity the day after the procedure however YOU SHOULD NOT DRIVE, use power tools, machinery or perform tasks that involve climbing or major physical exertion for 24 hours (because of the sedation medicines used during the test).  ? ?SYMPTOMS TO REPORT IMMEDIATELY: ?A gastroenterologist can be reached at any hour. Please call [phone]  for any of the following symptoms:  ? ?Following upper endoscopy (EGD, EUS, ERCP, esophageal dilation) ?Vomiting of blood or coffee ground material  ?New, significant abdominal pain  ?New, significant chest pain or pain under the shoulder blades  ?Painful or persistently difficult swallowing  ?New shortness of breath  ?Black, tarry-looking or red, bloody stools ? ?FOLLOW UP:  ?If any biopsies were taken you will be contacted by phone or by letter within the next 1-3 weeks. Call [phone]  if you have not heard about the biopsies in 3  weeks.  ?Please also call with any specific questions about appointments or follow up tests.  ?

## 2022-02-25 ENCOUNTER — Other Ambulatory Visit: Payer: Self-pay | Admitting: Hematology

## 2022-02-25 DIAGNOSIS — C249 Malignant neoplasm of biliary tract, unspecified: Secondary | ICD-10-CM

## 2022-02-25 NOTE — Anesthesia Postprocedure Evaluation (Signed)
Anesthesia Post Note  Patient: [name] [name]  Procedure(s) Performed: ENDOSCOPIC RETROGRADE CHOLANGIOPANCREATOGRAPHY (ERCP) WITH PROPOFOL BILIARY BRUSHING BILIARY STENT PLACEMENT     Patient location during evaluation: PACU Anesthesia Type: General Level of consciousness: awake and alert Pain management: pain level controlled Vital Signs Assessment: post-procedure vital signs reviewed and stable Respiratory status: spontaneous breathing, nonlabored ventilation, respiratory function stable and patient connected to nasal cannula oxygen Cardiovascular status: blood pressure returned to baseline and stable Postop Assessment: no apparent nausea or vomiting Anesthetic complications: no   No notable events documented.  Last Vitals:  Vitals:   02/23/22 1300 02/23/22 1310  BP: (!) 160/82 (!) 153/88  Pulse: 69 65  Resp: (!) 30 17  Temp:    SpO2: 94% 93%    Last Pain:  Vitals:   02/23/22 1310  TempSrc:   PainSc: 0-No pain                 Santa Lighter

## 2022-02-26 ENCOUNTER — Encounter (HOSPITAL_COMMUNITY): Payer: Self-pay | Admitting: Gastroenterology

## 2022-02-26 NOTE — Progress Notes (Signed)
Corrie Mckusick, DO  Donita Brooks D; P [facility] Procedure Requests OK for US guided liver mass biopsy.  [place] 02/23/22 shows mass.    Depending on presence/volume of ascites on the day of procedure, the performing doctor may suggest defer for safety.  Earleen Newport

## 2022-02-27 ENCOUNTER — Other Ambulatory Visit: Payer: Self-pay

## 2022-02-27 ENCOUNTER — Telehealth: Payer: Self-pay

## 2022-02-27 DIAGNOSIS — C249 Malignant neoplasm of biliary tract, unspecified: Secondary | ICD-10-CM

## 2022-02-27 LAB — CYTOLOGY - NON PAP

## 2022-02-27 NOTE — Telephone Encounter (Signed)
[name] left a voicemail on [facility] phone requesting to be seen earlier than scheduled appointment with MD on Friday. Per patient's wife, patient is very jaundiced, cold, has bilateral leg edema and complaints of constipation. All of these symptoms have been progressing since last ER visit this past week. This RN strongly advised that patient's temperature be assessed and to go to the ER for evaluation. Wife declined to go to the ER and requested for patient to be evaluated in [facility] for lab check. Lab appointment made for 12 [facility] appointment for 1230 1/10. [name] verbalized understanding of appointment date and time. She also verbalized understanding that if patient's symptoms continue to progress he has to go to the ER for further evaluation.

## 2022-02-27 NOTE — Progress Notes (Deleted)
Symptom Management Consult note [place]    Patient Care Team: Hoyt Koch, MD as PCP - General (Internal Medicine) Deneise Lever, MD as Consulting Physician (Pulmonary Disease) Dwan Bolt, MD as Consulting Physician (General Surgery) Kyung Rudd, MD as Consulting Physician (Radiation Oncology) [name], MD as Consulting Physician (Hematology) [name], MD as Consulting Physician (Hematology) Carol Ada, MD as Consulting Physician (Gastroenterology) [facility] as Consulting Physician (Urology) [place] as Consulting Physician [facility] and Palliative Medicine)    Name of the patient: [name] [name]  [id number]  Aug 14, 1955   Date of visit: 02/28/2022   Chief Complaint/Reason for visit: ***   Current Therapy: Surveillance   ASSESSMENT & PLAN: Patient is a 67 y.o. male  with oncologic history of extrahepatic cholangiocarcinoma  followed by Dr. Burr Medico.  I have viewed most recent oncology note and lab work.    #)Extrahepatic cholangiocarcinoma  -Patient is scheduled for CT CAP 03/02/22 to evaluate new liver lesion -Referral was sent by oncologist to hospice of [facility] for home palliative care.  Relationship has not yet been established. - Next appointment with oncologist is 03/02/22   #)  CBC CMP       Heme/Onc History: Oncology History Overview Note  Cancer Staging Cholangiocarcinoma [facility]) Staging form: Distal Bile Duct, AJCC 8th Edition - Pathologic stage from 10/31/2020: Stage I (pT1, pN0, cM0) - Signed by [name], MD on 11/22/2020    Cholangiocarcinoma ([place])  09/22/2020 Imaging   US Abdomen  IMPRESSION: Intrahepatic biliary ductal dilation and dilated proximal common bile duct. Findings are consistent with biliary obstruction, possibly due to a common bile duct mass, incompletely evaluated by ultrasound. Recommend multiphase CT or MRCP for further evaluation.   Mildly distended  gallbladder filled with material of varying echogenicity, likely sludge and tiny stones. In the absence of leukocytosis and right upper quadrant pain this is unlikely to represent cholecystitis.   Coarsened liver echogenicity with somewhat nodular left hepatic lobe, compatible with chronic liver disease.   09/22/2020 Imaging   CT AP  IMPRESSION: 1. Question bibasilar pulmonary fibrosis. 2. Homogeneous hyperdensity fills the gallbladder lumen - likely represents gallbladder sludge. Intra and extrahepatic biliary ductal dilatation with no main pancreatic duct dilatation. Findings are consistent with biliary obstruction, possibly due to a common bile duct mass which is incompletely evaluated on this single phase portal venous study. Recommend MRCP for further evaluation. 3. Nonspecific perirectal fat stranding with under distension of the rectum. 4. Aortic Atherosclerosis (ICD10-I70.0).   09/23/2020 Imaging   MRI Abdomen MRCP  IMPRESSION: 1. Obstructing soft tissue mass in the proximal common bile duct at or adjacent to the junction with the cystic duct, associated with moderate to severe intrahepatic biliary ductal dilatation indicating obstruction, as discussed above. 2. Biliary sludge filling the gallbladder lumen.   09/23/2020 Procedure   ERCP, Dr. Benson Norway  Impression: - The major papilla appeared normal. - A single localized biliary stricture was found in the common bile duct. The stricture was malignant appearing. - The left and right hepatic ducts and all intrahepatic branches and common bile duct were moderately dilated, with a mass causing an obstruction. - A biliary sphincterotomy was performed. - Cells for cytology obtained in the upper third of the main bile duct. - One plastic biliary stent was placed into the common bile duct.   09/23/2020 Pathology Results   A. BILIARY, BRUSHING:   FINAL MICROSCOPIC DIAGNOSIS:  - Malignant cells consistent with  adenocarcinoma     10/09/2020 Initial Diagnosis   Cholangiocarcinoma ([place])   10/10/2020 PET scan   IMPRESSION: 1. No evidence of metastatic disease. Common bile duct mass, better seen and described on MR abdomen 09/23/2020. 2. Common bile duct stent in place with persistent biliary ductal dilatation and associated pneumobilia. 3. Tiny left renal stone. 4. Bladder wall thickening. 5. Aortic atherosclerosis (ICD10-I70.0). Coronary artery calcification. 6.  Emphysema (ICD10-J43.9).   10/10/2020 Imaging   CT AP  IMPRESSION: No suspicious liver lesions. Replaced right hepatic artery arising from the SMA.   Common bile duct tumor appears unchanged compared to prior CT. Common bile duct stent place.   Findings of fibrotic interstitial lung disease in the partially visualized lower chest, could be further evaluated with dedicated high-resolution chest CT.   10/31/2020 Cancer Staging   Staging form: Distal Bile Duct, AJCC 8th Edition - Pathologic stage from 10/31/2020: Stage I (pT1, pN0, cM0) - Signed by [name], MD on 11/22/2020 Stage prefix: Initial diagnosis Total positive nodes: 0 Residual tumor (R): R1 - Microscopic   10/31/2020 Surgery   Whipple Procedure, Dr. Michaelle Birks   10/31/2020 Pathology Results   FINAL MICROSCOPIC DIAGNOSIS:   A. COMMON HEPATIC DUCT MARGIN:  - Adenocarcinoma involving bile duct.   B. NEW COMMON HEPATIC DUCT MARGIN:  - Atypical glands consistent with adenocarcinoma.   C. RIGHT HEPATIC DUCT MARGIN:  - Connective tissue with glands showing cautery artifact.  - No diagnostic malignancy identified.   D. GALLBLADDER, CHOLECYSTECTOMY:  - Benign gallbladder.  - Benign cystic duct lymph node.   E. LYMPH NODE, STATION 8, EXCISION:  - Two lymph nodes negative for metastatic carcinoma (0/2).   F. WHIPPLE PROCEDURE:  - Ductal adenocarcinoma, 1.6 cm.  - Carcinoma involves ampulla of Vater and periductal connective tissue.  - Perineural invasion.  - Surgical margins  negative for carcinoma.  - Six lymph nodes negative for metastatic carcinoma (0/6).  - See oncology table.   G. LYMPH NODE, PORTAL, EXCISION:  - One lymph node negative for metastatic carcinoma (0/1).    10/31/2020 Cancer Staging   Staging form: Distal Bile Duct, AJCC 8th Edition - Pathologic stage from 10/31/2020: Stage I (pT1, pN0, cM0) - Signed by [name], MD on 11/23/2020 Total positive nodes: 0 Residual tumor (R): R1 - Microscopic   05/08/2021 Imaging   EXAM: CT ABDOMEN AND PELVIS WITH CONTRAST  IMPRESSION: 1. No acute findings in the abdomen or pelvis. 2. Status post Whipple procedure. No evidence for local recurrence or metastatic disease. 3. Large stool volume. Imaging features compatible with clinical constipation. 4. Nonobstructing bilateral nephrolithiasis. 5. Aortic Atherosclerosis (ICD10-I70.0).   01/18/2022 Imaging    IMPRESSION: 1. Multiple right upper quadrant foci of hypermetabolism which are nonspecific in the postoperative period. (2 weeks out from ulcer repair). These are most likely related to postoperative low-grade inflammation. However, early peritoneal and nodal metastasis could have a similar appearance. Consider short-term follow-up with pre and post contrast abdominal MRI at 3 months. 2. No extra abdominal metastatic disease identified. 3. Incidental findings, including: Aortic atherosclerosis (ICD10-I70.0), coronary artery atherosclerosis and emphysema (ICD10-J43.9). Right proximal ureteric calculus. Small volume ascites.       Interval history-: TARA MALISZEWSKI is a 67 y.o. male with oncologic history as above presenting to [facility] today with chief complaint of wanting labs checked. Patient is accompanied by spouse who provides additional information.    Patient's last visit was oncologist was 02/21/2021.  At that visit there was discussion of  concern for recurrence. His labs showed new elevated t bili, increasing alk phos, and increasing liver  enzymes. He had ERCP and is waiting on pathology report. He is scheduled for upcoming CT CAP after new finding of liver mass on UA abdomen 02/23/22.  ROS  All other systems are reviewed and are negative for acute change except as noted in the HPI.    Allergies  Allergen Reactions   Quetiapine Other (See Comments)    "Made me sleep TOO MUCH"     Past Medical History:  Diagnosis Date   Alcoholism ([place])    pt has not drank since 11/2019   Anxiety    Bilateral carotid artery stenosis    Bipolar disorder ([place])    Cancer ([place]) 2022   bile duct   COPD (chronic obstructive pulmonary disease) ([place])    Depression    " post traumatic stress disorder" sees MD in [place] every 3-6 months.   Diabetes mellitus without complication ([place])    type 2   Dysrhythmia    hx. Bundle branch block- saw Dr. Kathlee Nations   GERD (gastroesophageal reflux disease)    Hypertension    Irregular heart rate    RBBB    Sleep apnea    cpap used with nose clip- Dr. Baird Lyons follows   Substance abuse [facility])    "tends to self medicate(Rx. meds or Alcohol) trying to get relief from "PSTD"     Past Surgical History:  Procedure Laterality Date   BILIARY BRUSHING  09/23/2020   Procedure: BILIARY BRUSHING;  Surgeon: Carol Ada, MD;  Location: [facility];  Service: Endoscopy;;   BILIARY BRUSHING  02/23/2022   Procedure: BILIARY BRUSHING;  Surgeon: Carol Ada, MD;  Location: [facility];  Service: Gastroenterology;;   BILIARY STENT PLACEMENT N/A 09/23/2020   Procedure: BILIARY STENT PLACEMENT;  Surgeon: Carol Ada, MD;  [facility]: [facility];  Service: Endoscopy;  Laterality: N/A;   BILIARY STENT PLACEMENT N/A 02/23/2022   Procedure: BILIARY STENT PLACEMENT;  Surgeon: Carol Ada, MD;  [facility]: [facility];  Service: Gastroenterology;  Laterality: N/A;   CHOLECYSTECTOMY     removed during whipple procedure   COLONOSCOPY WITH PROPOFOL N/A 01/19/2014   Procedure: COLONOSCOPY WITH  PROPOFOL;  Surgeon: Juanita Craver, MD;  Location: [facility];  Service: Endoscopy;  Laterality: N/A;   ENDOSCOPIC RETROGRADE CHOLANGIOPANCREATOGRAPHY (ERCP) WITH PROPOFOL N/A 02/23/2022   Procedure: ENDOSCOPIC RETROGRADE CHOLANGIOPANCREATOGRAPHY (ERCP) WITH PROPOFOL;  Surgeon: Carol Ada, MD;  [facility];  Service: Gastroenterology;  Laterality: N/A;   ERCP N/A 09/23/2020   Procedure: ENDOSCOPIC RETROGRADE CHOLANGIOPANCREATOGRAPHY (ERCP);  Surgeon: Carol Ada, MD;  Location: [facility];  Service: Endoscopy;  Laterality: N/A;   exploratory laparotomy N/A 01/03/2022   s/p graham patch for perforation at [place] N/A 10/02/2021   Procedure: open right inguinal hernia repair with mesh;  Surgeon: Dwan Bolt, MD;  Location: [place];  Service: General;  Laterality: N/A;   LAPAROSCOPY N/A 10/31/2020   Procedure: Manfred Shirts LAPAROSCOPY;  Surgeon: Dwan Bolt, MD;  Location: [place];  Service: General;  Laterality: N/A;  GEN AND TAP BLOCK   LAPAROTOMY N/A 01/03/2022   Procedure: EXPLORATORY LAPAROTOMY, GRAHAM PATCH REPAIR OF PERFORATED ULCER;  Surgeon: Donnie Mesa, MD;  [facility];  Service: General;  Laterality: N/A;   SPHINCTEROTOMY  09/23/2020   Procedure: Joan Mayans;  Surgeon: Carol Ada, MD;  [facility];  Service: Endoscopy;;   WHIPPLE PROCEDURE N/A 10/31/2020   Procedure: WHIPPLE  PROCEDURE, INTRAOPERATIVE ULTRASOUND;  Surgeon: Dwan Bolt, MD;  Location: [place];  Service: General;  Laterality: N/A;   WISDOM TOOTH EXTRACTION     67 years old    Social History   Socioeconomic History   Marital status: Married    Spouse name: Not on file   Number of children: 2   Years of education: Not on file   Highest education level: Not on file  Occupational History   Not on file  Tobacco Use   Smoking status: Every Day    Packs/day: 1.50    Years: 30.00    Total pack years: 45.00    Types: Cigarettes    Start date: 08/24/2017    Smokeless tobacco: Never  Vaping Use   Vaping Use: Former   Quit date: 11/20/2019  Substance and Sexual Activity   Alcohol use: Not Currently    Comment: Quit in October 2021, used to drink heavy   Drug use: No   Sexual activity: Yes  Other Topics Concern   Not on file  Social History Narrative   Not on file   Social Determinants of Health   Financial Resource Strain: Not on file  Food Insecurity: Not on file  Transportation Needs: Not on file  Physical Activity: Not on file  Stress: Not on file  Social Connections: Not on file  Intimate Partner Violence: Not on file    Family History  Problem Relation Age of Onset   Diabetes Father    Heart Problems Father    Heart disease Father    Mental illness Father      Current Outpatient Medications:    acetaminophen (TYLENOL) 650 MG CR tablet, Take 1,300 mg by mouth daily as needed for pain., Disp: , Rfl:    albuterol (VENTOLIN HFA) 108 (90 Base) MCG/ACT inhaler, Inhale 1-2 puffs into the lungs every 6 (six) hours as needed for wheezing or shortness of breath., Disp: , Rfl:    aspirin EC 81 MG tablet, Take 81 mg by mouth in the morning. Swallow whole., Disp: , Rfl:    busPIRone (BUSPAR) 5 MG tablet, Take 5 mg by mouth at bedtime., Disp: , Rfl:    Cyanocobalamin (VITAMIN B 12 PO), Take 1 tablet by mouth daily., Disp: , Rfl:    diphenoxylate-atropine (LOMOTIL) 2.5-0.025 MG tablet, Take 2 tablets by mouth in the morning and at bedtime., Disp: 120 tablet, Rfl: 1   MILK THISTLE PO, Take 1 capsule by mouth daily with breakfast., Disp: , Rfl:    Multiple Vitamin (MULTIVITAMIN WITH MINERALS) TABS tablet, Take 1 tablet by mouth daily with breakfast., Disp: , Rfl:    neomycin-polymyxin-hydrocortisone (CORTISPORIN) OTIC solution, Place 3 drops into the left ear 3 (three) times daily., Disp: 10 mL, Rfl: 0   PANCREAZE 4200-14200 units CPEP, Take 2 capsules by mouth 3 (three) times daily. Only with meals, Disp: , Rfl:    Pancrelipase,  Lip-Prot-Amyl, 4200-14200 units CPEP, Take 4,200 units of lipase by mouth 3 times daily with meals, bedtime and 2 AM. Take 2 pills by mouth with Breakfast, Lunch, and Diner (large meals).  Take 1 pill by mouth with snacks (2 snacks/day) (Patient not taking: Reported on 02/22/2022), Disp: 720 capsule, Rfl: 1   pantoprazole (PROTONIX) 40 MG tablet, Take 40 mg by mouth daily., Disp: , Rfl:    PARoxetine (PAXIL) 30 MG tablet, Take 60 mg by mouth at bedtime., Disp: , Rfl:    POTASSIUM PO, Take 1 tablet by mouth daily., Disp: ,  Rfl:    Probiotic Product (PROBIOTIC PO), Take 1 capsule by mouth in the morning., Disp: , Rfl:    SYSTANE 0.4-0.3 % GEL ophthalmic gel, Place 1 Application into both eyes 4 (four) times daily., Disp: , Rfl:    SYSTANE COMPLETE PF 0.6 % SOLN, Place 1 drop into both eyes 4 (four) times daily., Disp: , Rfl:    tamsulosin (FLOMAX) 0.4 MG CAPS capsule, Take 0.4 mg by mouth daily., Disp: , Rfl:    tiaGABine (GABITRIL) 4 MG tablet, Take 8 mg by mouth at bedtime., Disp: , Rfl:    VITAMIN D PO, Take 1 tablet by mouth daily., Disp: , Rfl:   PHYSICAL EXAM: ECOG FS:{CHL ONC [id number]   There were no vitals filed for this visit. Physical Exam     LABORATORY DATA: I have reviewed the data as listed    Latest Ref Rng & Units 02/22/2022    4:56 AM 02/21/2022    8:49 PM 02/21/2022    8:24 PM  CBC  WBC 4.0 - 10.5 K/uL 7.1   7.5   Hemoglobin 13.0 - 17.0 g/dL 10.6  11.9  11.5   Hematocrit 39.0 - 52.0 % 32.0  35.0  35.1   Platelets 150 - 400 K/uL 174   209         Latest Ref Rng & Units 02/22/2022    4:56 AM 02/21/2022    8:49 PM 02/21/2022    8:24 PM  CMP  Glucose 70 - 99 mg/dL 110  142  134   BUN 8 - 23 mg/dL 14  15  16   $ Creatinine 0.61 - 1.24 mg/dL 0.45  0.90  0.62   Sodium 135 - 145 mmol/L 138  143  140   Potassium 3.5 - 5.1 mmol/L 3.0  3.1  2.9   Chloride 98 - 111 mmol/L 106  104  105   CO2 22 - 32 mmol/L 27   25   Calcium 8.9 - 10.3 mg/dL 7.6   8.1   Total Protein 6.5 -  8.1 g/dL 5.6   6.6   Total Bilirubin 0.3 - 1.2 mg/dL 4.4   4.6   Alkaline Phos 38 - 126 U/L 1,269   1,586   AST 15 - 41 U/L 77   94   ALT 0 - 44 U/L 54   65        RADIOGRAPHIC STUDIES (from last 24 hours if applicable) I have personally reviewed the radiological images as listed and agreed with the findings in the report. No results found.      Visit Diagnosis: 1. 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   All questions were answered. The patient knows to call the clinic with any problems, questions or concerns. No barriers to learning was detected.  I have spent a total of *** minutes minutes of face-to-face and non-face-to-face time, preparing to see the patient, obtaining and/or reviewing separately obtained history, performing a medically appropriate examination, counseling and educating the patient, ordering tests, documenting clinical information in the electronic health record, and care coordination (communications with other health care professionals or caregivers).    Thank you for allowing me to participate in the care of this patient.    Barrie Folk, PA-C Department of Hematology/Oncology [facility] at [facility] Phone: [phone]  [phone]    02/27/2022 1:39 PM

## 2022-02-27 NOTE — Progress Notes (Signed)
Lab orders entered for [facility] visit on 1/10.

## 2022-02-28 ENCOUNTER — Inpatient Hospital Stay: Payer: Federal, State, Local not specified - PPO | Admitting: Physician Assistant

## 2022-02-28 ENCOUNTER — Ambulatory Visit: Payer: Federal, State, Local not specified - PPO | Admitting: Hematology

## 2022-02-28 ENCOUNTER — Other Ambulatory Visit: Payer: Federal, State, Local not specified - PPO

## 2022-02-28 ENCOUNTER — Telehealth: Payer: Self-pay

## 2022-02-28 ENCOUNTER — Ambulatory Visit: Payer: Federal, State, Local not specified - PPO | Admitting: Physical Therapy

## 2022-02-28 ENCOUNTER — Inpatient Hospital Stay: Payer: Federal, State, Local not specified - PPO

## 2022-02-28 DIAGNOSIS — C249 Malignant neoplasm of biliary tract, unspecified: Secondary | ICD-10-CM

## 2022-02-28 NOTE — Telephone Encounter (Signed)
Spoke with pt's wife regarding previous telephone call about keeping [facility] appt today.  Pt's spouse stated the pt was admitted to Hospice with [facility] yesterday 02/27/2022.  Pt's spouse said pt stated he does not want to do anymore chemotherapy and radiation especially after speaking with Dr. Benson Norway of Friday of last week.  Notified [place] and appt was cancelled.

## 2022-03-01 NOTE — Assessment & Plan Note (Deleted)
-  pT1cN0M0, stage I  -diagnosed in 09/2020, s/p Whipple surgery on 10/31/2020.  -He received 6 months of adjuvant chemotherapy with Xarelto. -On cancer surveillance now. -His tumor marker CA 19.9 has been trending up. -his PET scan from 01/18/3022 showed multiple right upper quadrant foci of hypermetabolism which are nonspecific in the postoperative period (2 weeks out from ulcer repair)  -he underwent ERCP by Dr. Benson Norway on 1/5 and biliary blushing came back positive for cancer cells

## 2022-03-01 NOTE — Progress Notes (Deleted)
[place]   [phone] [phone]   Clinic Follow up Note   Patient Care Team: Hoyt Koch, MD as PCP - General (Internal Medicine) Deneise Lever, MD as Consulting Physician (Pulmonary Disease) Dwan Bolt, MD as Consulting Physician (General Surgery) Kyung Rudd, MD as Consulting Physician (Radiation Oncology) [name], MD as Consulting Physician (Hematology) [name], MD as Consulting Physician (Hematology) Carol Ada, MD as Consulting Physician (Gastroenterology) [facility] as Consulting Physician (Urology) [facility] as Consulting Physician [facility] and Palliative Medicine)  Date of Service:  03/01/2022  CHIEF COMPLAINT: f/u of  extrahepatic cholangiocarcinoma    CURRENT THERAPY:  Surveillance     ASSESSMENT: *** [name] [name] is a 67 y.o. male with   No problem-specific Assessment & Plan notes found for this encounter.  ***   PLAN: {Everything Dr. Burr Medico talks to pt about, including reviewing scans and labs. } -{proceed with ***} -{lab with/without flush and f/u when?}   SUMMARY OF ONCOLOGIC HISTORY: Oncology History Overview Note  Cancer Staging Cholangiocarcinoma [facility]) Staging form: Distal Bile Duct, AJCC 8th Edition - Pathologic stage from 10/31/2020: Stage I (pT1, pN0, cM0) - Signed by [name], MD on 11/22/2020    Cholangiocarcinoma ([place])  09/22/2020 Imaging   US Abdomen  IMPRESSION: Intrahepatic biliary ductal dilation and dilated proximal common bile duct. Findings are consistent with biliary obstruction, possibly due to a common bile duct mass, incompletely evaluated by ultrasound. Recommend multiphase CT or MRCP for further evaluation.   Mildly distended gallbladder filled with material of varying echogenicity, likely sludge and tiny stones. In the absence of leukocytosis and right upper quadrant pain this is unlikely to represent cholecystitis.    Coarsened liver echogenicity with somewhat nodular left hepatic lobe, compatible with chronic liver disease.   09/22/2020 Imaging   CT AP  IMPRESSION: 1. Question bibasilar pulmonary fibrosis. 2. Homogeneous hyperdensity fills the gallbladder lumen - likely represents gallbladder sludge. Intra and extrahepatic biliary ductal dilatation with no main pancreatic duct dilatation. Findings are consistent with biliary obstruction, possibly due to a common bile duct mass which is incompletely evaluated on this single phase portal venous study. Recommend MRCP for further evaluation. 3. Nonspecific perirectal fat stranding with under distension of the rectum. 4. Aortic Atherosclerosis (ICD10-I70.0).   09/23/2020 Imaging   MRI Abdomen MRCP  IMPRESSION: 1. Obstructing soft tissue mass in the proximal common bile duct at or adjacent to the junction with the cystic duct, associated with moderate to severe intrahepatic biliary ductal dilatation indicating obstruction, as discussed above. 2. Biliary sludge filling the gallbladder lumen.   09/23/2020 Procedure   ERCP, Dr. Benson Norway  Impression: - The major papilla appeared normal. - A single localized biliary stricture was found in the common bile duct. The stricture was malignant appearing. - The left and right hepatic ducts and all intrahepatic branches and common bile duct were moderately dilated, with a mass causing an obstruction. - A biliary sphincterotomy was performed. - Cells for cytology obtained in the upper third of the main bile duct. - One plastic biliary stent was placed into the common bile duct.   09/23/2020 Pathology Results   A. BILIARY, BRUSHING:   FINAL MICROSCOPIC DIAGNOSIS:  - Malignant cells consistent with adenocarcinoma    10/09/2020 Initial Diagnosis   Cholangiocarcinoma ([place])   10/10/2020 PET scan   IMPRESSION: 1. No evidence of metastatic disease. Common bile duct mass, better seen and described on MR abdomen  09/23/2020. 2.  Common bile duct stent in place with persistent biliary ductal dilatation and associated pneumobilia. 3. Tiny left renal stone. 4. Bladder wall thickening. 5. Aortic atherosclerosis (ICD10-I70.0). Coronary artery calcification. 6.  Emphysema (ICD10-J43.9).   10/10/2020 Imaging   CT AP  IMPRESSION: No suspicious liver lesions. Replaced right hepatic artery arising from the SMA.   Common bile duct tumor appears unchanged compared to prior CT. Common bile duct stent place.   Findings of fibrotic interstitial lung disease in the partially visualized lower chest, could be further evaluated with dedicated high-resolution chest CT.   10/31/2020 Cancer Staging   Staging form: Distal Bile Duct, AJCC 8th Edition - Pathologic stage from 10/31/2020: Stage I (pT1, pN0, cM0) - Signed by [name], MD on 11/22/2020 Stage prefix: Initial diagnosis Total positive nodes: 0 Residual tumor (R): R1 - Microscopic   10/31/2020 Surgery   Whipple Procedure, Dr. Michaelle Birks   10/31/2020 Pathology Results   FINAL MICROSCOPIC DIAGNOSIS:   A. COMMON HEPATIC DUCT MARGIN:  - Adenocarcinoma involving bile duct.   B. NEW COMMON HEPATIC DUCT MARGIN:  - Atypical glands consistent with adenocarcinoma.   C. RIGHT HEPATIC DUCT MARGIN:  - Connective tissue with glands showing cautery artifact.  - No diagnostic malignancy identified.   D. GALLBLADDER, CHOLECYSTECTOMY:  - Benign gallbladder.  - Benign cystic duct lymph node.   E. LYMPH NODE, STATION 8, EXCISION:  - Two lymph nodes negative for metastatic carcinoma (0/2).   F. WHIPPLE PROCEDURE:  - Ductal adenocarcinoma, 1.6 cm.  - Carcinoma involves ampulla of Vater and periductal connective tissue.  - Perineural invasion.  - Surgical margins negative for carcinoma.  - Six lymph nodes negative for metastatic carcinoma (0/6).  - See oncology table.   G. LYMPH NODE, PORTAL, EXCISION:  - One lymph node negative for metastatic carcinoma  (0/1).    10/31/2020 Cancer Staging   Staging form: Distal Bile Duct, AJCC 8th Edition - Pathologic stage from 10/31/2020: Stage I (pT1, pN0, cM0) - Signed by [name], MD on 11/23/2020 Total positive nodes: 0 Residual tumor (R): R1 - Microscopic   05/08/2021 Imaging   EXAM: CT ABDOMEN AND PELVIS WITH CONTRAST  IMPRESSION: 1. No acute findings in the abdomen or pelvis. 2. Status post Whipple procedure. No evidence for local recurrence or metastatic disease. 3. Large stool volume. Imaging features compatible with clinical constipation. 4. Nonobstructing bilateral nephrolithiasis. 5. Aortic Atherosclerosis (ICD10-I70.0).   01/18/2022 Imaging    IMPRESSION: 1. Multiple right upper quadrant foci of hypermetabolism which are nonspecific in the postoperative period. (2 weeks out from ulcer repair). These are most likely related to postoperative low-grade inflammation. However, early peritoneal and nodal metastasis could have a similar appearance. Consider short-term follow-up with pre and post contrast abdominal MRI at 3 months. 2. No extra abdominal metastatic disease identified. 3. Incidental findings, including: Aortic atherosclerosis (ICD10-I70.0), coronary artery atherosclerosis and emphysema (ICD10-J43.9). Right proximal ureteric calculus. Small volume ascites.      INTERVAL HISTORY: *** [name] [name] is here for a follow up of  extrahepatic cholangiocarcinoma   He was last seen by me on 02/21/2022 He presents to the clinic      All other systems were reviewed with the patient and are negative.  MEDICAL HISTORY:  Past Medical History:  Diagnosis Date   Alcoholism [facility])    pt has not drank since 11/2019   Anxiety    Bilateral carotid artery stenosis    Bipolar disorder ([place])    Cancer ([place]) 2022  bile duct   COPD (chronic obstructive pulmonary disease) (HCC)    Depression    " post traumatic stress disorder" sees MD in [place] every 3-6 months.   Diabetes  mellitus without complication ([place])    type 2   Dysrhythmia    hx. Bundle branch block- saw Dr. Kathlee Nations   GERD (gastroesophageal reflux disease)    Hypertension    Irregular heart rate    RBBB    Sleep apnea    cpap used with nose clip- Dr. Baird Lyons follows   Substance abuse [facility])    "tends to self medicate(Rx. meds or Alcohol) trying to get relief from "PSTD"    SURGICAL HISTORY: Past Surgical History:  Procedure Laterality Date   BILIARY BRUSHING  09/23/2020   Procedure: BILIARY BRUSHING;  Surgeon: Carol Ada, MD;  Location: [facility];  Service: Endoscopy;;   BILIARY BRUSHING  02/23/2022   Procedure: BILIARY BRUSHING;  Surgeon: Carol Ada, MD;  Location: [facility];  Service: Gastroenterology;;   BILIARY STENT PLACEMENT N/A 09/23/2020   Procedure: BILIARY STENT PLACEMENT;  Surgeon: Carol Ada, MD;  [facility]: [facility];  Service: Endoscopy;  Laterality: N/A;   BILIARY STENT PLACEMENT N/A 02/23/2022   Procedure: BILIARY STENT PLACEMENT;  Surgeon: Carol Ada, MD;  [facility];  Service: Gastroenterology;  Laterality: N/A;   CHOLECYSTECTOMY     removed during whipple procedure   COLONOSCOPY WITH PROPOFOL N/A 01/19/2014   Procedure: COLONOSCOPY WITH PROPOFOL;  Surgeon: Juanita Craver, MD;  Location: [facility];  Service: Endoscopy;  Laterality: N/A;   ENDOSCOPIC RETROGRADE CHOLANGIOPANCREATOGRAPHY (ERCP) WITH PROPOFOL N/A 02/23/2022   Procedure: ENDOSCOPIC RETROGRADE CHOLANGIOPANCREATOGRAPHY (ERCP) WITH PROPOFOL;  Surgeon: Carol Ada, MD;  [facility];  Service: Gastroenterology;  Laterality: N/A;   ERCP N/A 09/23/2020   Procedure: ENDOSCOPIC RETROGRADE CHOLANGIOPANCREATOGRAPHY (ERCP);  Surgeon: Carol Ada, MD;  Location: [facility];  Service: Endoscopy;  Laterality: N/A;   exploratory laparotomy N/A 01/03/2022   s/p graham patch for perforation at [place] N/A 10/02/2021   Procedure: open right inguinal  hernia repair with mesh;  Surgeon: Dwan Bolt, MD;  Location: [place];  Service: General;  Laterality: N/A;   LAPAROSCOPY N/A 10/31/2020   Procedure: Manfred Shirts LAPAROSCOPY;  Surgeon: Dwan Bolt, MD;  Location[place];  Service: General;  Laterality: N/A;  GEN AND TAP BLOCK   LAPAROTOMY N/A 01/03/2022   Procedure: EXPLORATORY LAPAROTOMY, GRAHAM PATCH REPAIR OF PERFORATED ULCER;  Surgeon: Donnie Mesa, MD;  Location[facility];  Service: General;  Laterality: N/A;   SPHINCTEROTOMY  09/23/2020   Procedure: Joan Mayans;  Surgeon: Carol Ada, MD;  [facility];  Service: Endoscopy;;   WHIPPLE PROCEDURE N/A 10/31/2020   Procedure: WHIPPLE PROCEDURE, INTRAOPERATIVE ULTRASOUND;  Surgeon: Dwan Bolt, MD;  Location: [place];  Service: General;  Laterality: N/A;   WISDOM TOOTH EXTRACTION     67 years old    I have reviewed the social history and family history with the patient and they are unchanged from previous note.  ALLERGIES:  is allergic to quetiapine.  MEDICATIONS:  Current Outpatient Medications  Medication Sig Dispense Refill   acetaminophen (TYLENOL) 650 MG CR tablet Take 1,300 mg by mouth daily as needed for pain.     albuterol (VENTOLIN HFA) 108 (90 Base) MCG/ACT inhaler Inhale 1-2 puffs into the lungs every 6 (six) hours as needed for wheezing or shortness of breath.     aspirin EC 81 MG tablet Take 81  mg by mouth in the morning. Swallow whole.     busPIRone (BUSPAR) 5 MG tablet Take 5 mg by mouth at bedtime.     Cyanocobalamin (VITAMIN B 12 PO) Take 1 tablet by mouth daily.     diphenoxylate-atropine (LOMOTIL) 2.5-0.025 MG tablet Take 2 tablets by mouth in the morning and at bedtime. 120 tablet 1   MILK THISTLE PO Take 1 capsule by mouth daily with breakfast.     Multiple Vitamin (MULTIVITAMIN WITH MINERALS) TABS tablet Take 1 tablet by mouth daily with breakfast.     neomycin-polymyxin-hydrocortisone (CORTISPORIN) OTIC solution Place 3 drops into the left ear 3  (three) times daily. 10 mL 0   PANCREAZE 4200-14200 units CPEP Take 2 capsules by mouth 3 (three) times daily. Only with meals     Pancrelipase, Lip-Prot-Amyl, 4200-14200 units CPEP Take 4,200 units of lipase by mouth 3 times daily with meals, bedtime and 2 AM. Take 2 pills by mouth with Breakfast, Lunch, and Diner (large meals).  Take 1 pill by mouth with snacks (2 snacks/day) (Patient not taking: Reported on 02/22/2022) 720 capsule 1   pantoprazole (PROTONIX) 40 MG tablet Take 40 mg by mouth daily.     PARoxetine (PAXIL) 30 MG tablet Take 60 mg by mouth at bedtime.     POTASSIUM PO Take 1 tablet by mouth daily.     Probiotic Product (PROBIOTIC PO) Take 1 capsule by mouth in the morning.     SYSTANE 0.4-0.3 % GEL ophthalmic gel Place 1 Application into both eyes 4 (four) times daily.     SYSTANE COMPLETE PF 0.6 % SOLN Place 1 drop into both eyes 4 (four) times daily.     tamsulosin (FLOMAX) 0.4 MG CAPS capsule Take 0.4 mg by mouth daily.     tiaGABine (GABITRIL) 4 MG tablet Take 8 mg by mouth at bedtime.     VITAMIN D PO Take 1 tablet by mouth daily.     No current facility-administered medications for this visit.    PHYSICAL EXAMINATION: ECOG PERFORMANCE STATUS: {CHL ONC ECOG PS:[id number]}  There were no vitals filed for this visit. Wt Readings from Last 3 Encounters:  02/23/22 150 lb (68 kg)  02/21/22 156 lb (70.8 kg)  01/31/22 149 lb (67.6 kg)    {Only keep what was examined. If exam not performed, can use .CEXAM } GENERAL:alert, no distress and comfortable SKIN: skin color, texture, turgor are normal, no rashes or significant lesions EYES: normal, Conjunctiva are pink and non-injected, sclera clear {OROPHARYNX:no exudate, no erythema and lips, buccal mucosa, and tongue normal}  NECK: supple, thyroid normal size, non-tender, without nodularity LYMPH:  no palpable lymphadenopathy in the cervical, axillary {or inguinal} LUNGS: clear to auscultation and percussion with normal  breathing effort HEART: regular rate & rhythm and no murmurs and no lower extremity edema ABDOMEN:abdomen soft, non-tender and normal bowel sounds Musculoskeletal:no cyanosis of digits and no clubbing  NEURO: alert & oriented x 3 with fluent speech, no focal motor/sensory deficits  LABORATORY DATA:  I have reviewed the data as listed    Latest Ref Rng & Units 02/22/2022    4:56 AM 02/21/2022    8:49 PM 02/21/2022    8:24 PM  CBC  WBC 4.0 - 10.5 K/uL 7.1   7.5   Hemoglobin 13.0 - 17.0 g/dL 10.6  11.9  11.5   Hematocrit 39.0 - 52.0 % 32.0  35.0  35.1   Platelets 150 - 400 K/uL 174   209  Latest Ref Rng & Units 02/22/2022    4:56 AM 02/21/2022    8:49 PM 02/21/2022    8:24 PM  CMP  Glucose 70 - 99 mg/dL 110  142  134   BUN 8 - 23 mg/dL 14  15  16   $ Creatinine 0.61 - 1.24 mg/dL 0.45  0.90  0.62   Sodium 135 - 145 mmol/L 138  143  140   Potassium 3.5 - 5.1 mmol/L 3.0  3.1  2.9   Chloride 98 - 111 mmol/L 106  104  105   CO2 22 - 32 mmol/L 27   25   Calcium 8.9 - 10.3 mg/dL 7.6   8.1   Total Protein 6.5 - 8.1 g/dL 5.6   6.6   Total Bilirubin 0.3 - 1.2 mg/dL 4.4   4.6   Alkaline Phos 38 - 126 U/L 1,269   1,586   AST 15 - 41 U/L 77   94   ALT 0 - 44 U/L 54   65       RADIOGRAPHIC STUDIES: I have personally reviewed the radiological images as listed and agreed with the findings in the report. No results found.    No orders of the defined types were placed in this encounter.  All questions were answered. The patient knows to call the clinic with any problems, questions or concerns. No barriers to learning was detected. The total time spent in the appointment was {CHL ONC TIME VISIT - ZX:[id number].     Baldemar Friday, CMA 03/01/2022   I, Audry Riles, CMA, am acting as scribe for [name], MD.   {Add scribe attestation statement}

## 2022-03-02 ENCOUNTER — Emergency Department (HOSPITAL_COMMUNITY): Payer: Federal, State, Local not specified - PPO

## 2022-03-02 ENCOUNTER — Emergency Department (HOSPITAL_COMMUNITY)
Admission: EM | Admit: 2022-03-02 | Discharge: 2022-03-02 | Discharge status: Against Medical Advice | Payer: Federal, State, Local not specified - PPO | Attending: Student | Admitting: Student

## 2022-03-02 ENCOUNTER — Ambulatory Visit: Payer: PRIVATE HEALTH INSURANCE | Admitting: Hematology

## 2022-03-02 ENCOUNTER — Other Ambulatory Visit: Payer: Self-pay

## 2022-03-02 ENCOUNTER — Ambulatory Visit (HOSPITAL_COMMUNITY): Admission: RE | Admit: 2022-03-02 | Payer: PRIVATE HEALTH INSURANCE | Source: Ambulatory Visit

## 2022-03-02 ENCOUNTER — Inpatient Hospital Stay: Payer: Federal, State, Local not specified - PPO | Admitting: Hematology

## 2022-03-02 ENCOUNTER — Encounter (HOSPITAL_COMMUNITY): Payer: Self-pay

## 2022-03-02 ENCOUNTER — Other Ambulatory Visit: Payer: PRIVATE HEALTH INSURANCE

## 2022-03-02 ENCOUNTER — Inpatient Hospital Stay: Payer: Federal, State, Local not specified - PPO

## 2022-03-02 ENCOUNTER — Encounter (HOSPITAL_COMMUNITY): Payer: Self-pay | Admitting: Hospice and Palliative Medicine

## 2022-03-02 DIAGNOSIS — Z7982 Long term (current) use of aspirin: Secondary | ICD-10-CM | POA: Diagnosis not present

## 2022-03-02 DIAGNOSIS — Z5329 Procedure and treatment not carried out because of patient's decision for other reasons: Secondary | ICD-10-CM | POA: Diagnosis not present

## 2022-03-02 DIAGNOSIS — C221 Intrahepatic bile duct carcinoma: Secondary | ICD-10-CM

## 2022-03-02 DIAGNOSIS — J449 Chronic obstructive pulmonary disease, unspecified: Secondary | ICD-10-CM | POA: Diagnosis not present

## 2022-03-02 DIAGNOSIS — R180 Malignant ascites: Secondary | ICD-10-CM | POA: Insufficient documentation

## 2022-03-02 DIAGNOSIS — R17 Unspecified jaundice: Secondary | ICD-10-CM | POA: Diagnosis not present

## 2022-03-02 DIAGNOSIS — I10 Essential (primary) hypertension: Secondary | ICD-10-CM | POA: Diagnosis not present

## 2022-03-02 DIAGNOSIS — R140 Abdominal distension (gaseous): Secondary | ICD-10-CM | POA: Diagnosis present

## 2022-03-02 DIAGNOSIS — F17210 Nicotine dependence, cigarettes, uncomplicated: Secondary | ICD-10-CM | POA: Insufficient documentation

## 2022-03-02 DIAGNOSIS — H1589 Other disorders of sclera: Secondary | ICD-10-CM | POA: Diagnosis not present

## 2022-03-02 DIAGNOSIS — E119 Type 2 diabetes mellitus without complications: Secondary | ICD-10-CM | POA: Diagnosis not present

## 2022-03-02 DIAGNOSIS — Z515 Encounter for palliative care: Secondary | ICD-10-CM | POA: Insufficient documentation

## 2022-03-02 DIAGNOSIS — Z85038 Personal history of other malignant neoplasm of large intestine: Secondary | ICD-10-CM | POA: Insufficient documentation

## 2022-03-02 LAB — COMPREHENSIVE METABOLIC PANEL
ALT: 77 U/L — ABNORMAL HIGH (ref 0–44)
AST: 118 U/L — ABNORMAL HIGH (ref 15–41)
Albumin: 2.2 g/dL — ABNORMAL LOW (ref 3.5–5.0)
Alkaline Phosphatase: 1239 U/L — ABNORMAL HIGH (ref 38–126)
Anion gap: 5 (ref 5–15)
BUN: 10 mg/dL (ref 8–23)
CO2: 26 mmol/L (ref 22–32)
Calcium: 8 mg/dL — ABNORMAL LOW (ref 8.9–10.3)
Chloride: 106 mmol/L (ref 98–111)
Creatinine, Ser: 0.64 mg/dL (ref 0.61–1.24)
GFR, Estimated: >60 mL/min (ref >60)
Glucose, Bld: 113 mg/dL — ABNORMAL HIGH (ref 70–99)
Potassium: 2.8 mmol/L — ABNORMAL LOW (ref 3.5–5.1)
Sodium: 137 mmol/L (ref 135–145)
Total Bilirubin: 12.1 mg/dL — ABNORMAL HIGH (ref 0.3–1.2)
Total Protein: 6 g/dL — ABNORMAL LOW (ref 6.5–8.1)

## 2022-03-02 LAB — CBC
HCT: 28.7 % — ABNORMAL LOW (ref 39.0–52.0)
Hemoglobin: 9.6 g/dL — ABNORMAL LOW (ref 13.0–17.0)
MCH: 34.3 pg — ABNORMAL HIGH (ref 26.0–34.0)
MCHC: 33.4 g/dL (ref 30.0–36.0)
MCV: 102.5 fL — ABNORMAL HIGH (ref 80.0–100.0)
Platelets: 172 10*3/uL (ref 150–400)
RBC: 2.8 MIL/uL — ABNORMAL LOW (ref 4.22–5.81)
RDW: 17.1 % — ABNORMAL HIGH (ref 11.5–15.5)
WBC: 6.9 10*3/uL (ref 4.0–10.5)
nRBC: 0 % (ref 0.0–0.2)

## 2022-03-02 LAB — AMMONIA: Ammonia: 70 umol/L — ABNORMAL HIGH (ref 9–35)

## 2022-03-02 MED ORDER — LIDOCAINE HCL 1 % IJ SOLN
INTRAMUSCULAR | Status: AC
  Administered 2022-03-02: 10 mL
  Filled 2022-03-02: qty 20

## 2022-03-02 NOTE — ED Notes (Signed)
Updated wife with pt permission.  Once he is up for discharge she reports that she will be able to come pick him up

## 2022-03-02 NOTE — Progress Notes (Signed)
Interventional Radiology Brief Note:  Patient presents to [facility] with history of small volume ascites in the setting of metastatic colon cancer.  He is currently in hospice and as such has been recommended for consideration of a PleurX drain.  This was being facilitated as an outpt, however patient presents to [facility] today for drainage.  Case discussed with Dr. Pascal Lux.  Mr. [name] [name] has had stable small-volume ascites and has never had a paracentesis.  Recommend proceeding with paracentesis today with continued referral for outpatient PleurX if he reaccumulates fluid.   Brynda Greathouse, MS RD PA-C 12:46 PM

## 2022-03-02 NOTE — ED Provider Triage Note (Cosign Needed)
Emergency Medicine Provider Triage Evaluation Note  [name] [name] , a 67 y.o. male  was evaluated in triage.  Pt complains of abdominal distention and bloating secondary to carcinoma of colon.  Patient reports that he was advised to report to ED for drainage.  Patient denies fevers, nausea or vomiting.  Patient extremely jaundiced on examination.  Review of Systems  Positive:  Negative:   Physical Exam  BP (!) 139/121   Pulse 81   Temp 97.9 F (36.6 C) (Oral)   Resp 16   Ht '5\' 10"'$  (1.778 m)   Wt 68 kg   SpO2 100%   BMI 21.52 kg/m  Gen:   Awake, no distress   Resp:  Normal effort  MSK:   Moves extremities without difficulty  Other:    Medical Decision Making  Medically screening exam initiated at 10:43 AM.  Appropriate orders placed.  [name] [name] was informed that the remainder of the evaluation will be completed by another provider, this initial triage assessment does not replace that evaluation, and the importance of remaining in the ED until their evaluation is complete.     Azucena Cecil, PA-C 03/02/22 1131

## 2022-03-02 NOTE — ED Triage Notes (Signed)
Patient reports that he went into Hospice 2 days ago. Patient states he was told he was going to get a drain his abdominal fluid. Patient has swelling of the abdomen and is jaundice.

## 2022-03-02 NOTE — Procedures (Signed)
PROCEDURE SUMMARY:  Successful US guided paracentesis from right lateral abdomen.  Yielded 2.3 liters of bright yellow fluid.  No immediate complications.  Pt tolerated well.   Specimen was sent for labs.  EBL < 29m  KDocia BarrierPA-C 03/02/2022 3:51 PM

## 2022-03-02 NOTE — ED Notes (Signed)
Patient left and was found in the lobby. RN tried to talk to the patient to come back but patient refused. EDP notified.

## 2022-03-02 NOTE — ED Notes (Signed)
Pt is sleeping

## 2022-03-02 NOTE — ED Notes (Signed)
Called and updated wife.  Just let her know that pt is sleeping at this time.

## 2022-03-02 NOTE — ED Notes (Signed)
Please call wife with an update when you get a chance.

## 2022-03-02 NOTE — Progress Notes (Signed)
   This pt is active with hospice services at home. Our nurse saw the pt yesterday and he has a PPS 60% had no pain, no SOB, was having regular BM's noted to have some bilateral lower extremity edema with compression stockings on which does go down at night when his legs are elevated. He went to [facility] yesterday evening and the wife noted when he got home that his abdomen was very tight and that he appeared to be more jaundiced and he has SOB with walking.  Our nurse made visit this am spoke to the MD Dr. Domingo Cocking and it was decided to do some spirolactone over weekend to allow up to get orders for outpt IR to drain and possibly place plurex drain. The family returned call later after nurse visiting and stated he does not want to wait on a pill to assist with this and wants to go to ED for immediate relief.  Our nurse will go ahead and continue to have a drain placed next week-- Hopefully he will have fluid there to be able to do this out pt.   It would be great if plurex could be placed in hospital today if he has enough to be drained off.    Webb Silversmith RN [phone]

## 2022-03-02 NOTE — ED Provider Notes (Signed)
[facility] DEPT Provider Note  CSN: [id number] Arrival date & time: 03/02/22 0174  Chief Complaint(s) Hospice patient and Jaundice  HPI [name] [name] is a 67 y.o. male with PMH alcohol use in remission, COPD, T2DM, cholangiocarcinoma, previous perforated duodenal ulcer who presents to the emergency department for evaluation of jaundice and abdominal distention.  Patient recently discharged from the hospital with terminal diagnosis in regards to his cholangiocarcinoma and currently is no longer a candidate for oncologic therapy.  He was discharged to the hospice facility and return to the emergency department with abdominal swelling, worsening jaundice and need for a therapeutic paracentesis.  He states that there was some discussion about putting an abdominal drain in so that he can drain his paracentesis at the facility if needed.  He has never had a paracentesis before.  Patient is alert and oriented and currently not showing signs of hepatic encephalopathy at this time.  Denies chest pain, shortness of breath, headache, fever or other systemic symptoms.  Patient currently is a DNR.   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   Diabetes mellitus without complication ([place])    type 2   Dysrhythmia    hx. Bundle branch block- saw Dr. Kathlee Nations   GERD (gastroesophageal reflux disease)    Hypertension    Irregular heart rate    RBBB    Sleep apnea    cpap used with nose clip- Dr. Baird Lyons follows   Substance abuse [facility])    "tends to self medicate(Rx. 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   Biliary obstruction 09/22/2020   Urinary hesitancy 07/28/2020   Other fatigue 07/28/2020   Tobacco abuse 10/26/2019   Carotid artery calcification 12/18/2018   Interstitial pulmonary fibrosis ([place]) 10/09/2018   Diabetes mellitus type 2 with complications ([place]) 94/49/6759   Seasonal and perennial allergic rhinitis 06/22/2016   Elevated blood pressure reading 02/01/2016   Alcohol dependence in remission ([place]) 02/01/2016   PTSD (post-traumatic stress disorder) 08/13/2014   Vertigo 09/26/2012   Syncope 09/26/2012   COPD mixed type ([place]) 09/26/2012   Hyperlipidemia associated with type 2 diabetes mellitus ([place]) 09/23/2012   Unspecified vitamin D deficiency 09/23/2012   Chronic anxiety 03/23/2011   Obstructive sleep apnea 03/23/2011   Obesity 03/23/2011   Chronic arthralgias of knees and hips 03/23/2011   Health care maintenance 03/23/2011   Home Medication(s) Prior to Admission medications   Medication Sig Start Date End Date Taking? Authorizing Provider  acetaminophen (TYLENOL) 650 MG CR tablet Take 1,300 mg by mouth daily as needed for pain.    [provider]  albuterol (VENTOLIN HFA) 108 (90 Base) MCG/ACT inhaler Inhale 1-2 puffs into the lungs every 6 (six) hours as  needed for wheezing or shortness of breath.    [provider]  aspirin EC 81 MG tablet Take 81 mg by mouth in the morning. Swallow whole.    [provider]  busPIRone (BUSPAR) 5 MG tablet Take 5 mg by mouth at bedtime. 07/01/20   [provider]  Cyanocobalamin (VITAMIN B 12 PO) Take 1 tablet by mouth daily.    [provider]  diphenoxylate-atropine (LOMOTIL) 2.5-0.025 MG tablet Take 2 tablets by mouth in the morning and at bedtime. 02/21/22   [name], MD  MILK THISTLE PO Take 1 capsule by mouth daily with breakfast.    [provider]  Multiple Vitamin (MULTIVITAMIN WITH MINERALS) TABS tablet Take 1 tablet by mouth daily with breakfast.    [provider]  neomycin-polymyxin-hydrocortisone (CORTISPORIN) OTIC solution Place 3 drops into the left ear 3 (three) times daily. 01/31/22   Hoyt Koch, MD  PANCREAZE [phone] units CPEP Take 2 capsules by mouth 3 (three) times daily. Only with meals    [provider]  Pancrelipase, Lip-Prot-Amyl, [phone] units CPEP Take 4,200 units of lipase by mouth 3 times daily with meals, bedtime and 2 AM. Take 2 pills by mouth with Breakfast, Lunch, and Diner (large meals).  Take 1 pill by mouth with snacks (2 snacks/day) Patient not taking: Reported on 02/22/2022 01/02/22   [name], MD  pantoprazole (PROTONIX) 40 MG tablet Take 40 mg by mouth daily. 02/15/22   [provider]  PARoxetine (PAXIL) 30 MG tablet Take 60 mg by mouth at bedtime.    [provider]  POTASSIUM PO Take 1 tablet by mouth daily.    [provider]  Probiotic Product (PROBIOTIC PO) Take 1 capsule by mouth in the morning.    [provider]  SYSTANE 0.4-0.3 % GEL ophthalmic gel Place 1 Application into both eyes 4 (four) times daily.    [provider]  SYSTANE COMPLETE PF 0.6 % SOLN Place 1 drop into both eyes 4 (four) times daily.    [provider]  tamsulosin (FLOMAX) 0.4 MG CAPS capsule Take 0.4 mg by mouth daily.    [provider]  tiaGABine (GABITRIL) 4 MG tablet Take 8 mg by mouth at bedtime.    [provider]  VITAMIN D PO Take 1 tablet by mouth daily.    [provider]                                                                                                                                     Past Surgical History Past Surgical History:  Procedure Laterality Date   BILIARY BRUSHING  09/23/2020   Procedure: BILIARY BRUSHING;  Surgeon: Carol Ada, MD;  Location: [facility];  Service: Endoscopy;;   BILIARY BRUSHING  02/23/2022   Procedure: BILIARY BRUSHING;  Surgeon: Carol Ada, MD;  Location: [facility];  Service: Gastroenterology;;   BILIARY  STENT PLACEMENT N/A 09/23/2020   Procedure: BILIARY STENT PLACEMENT;  Surgeon: Carol Ada, MD;  [facility];  Service: Endoscopy;  Laterality: N/A;   BILIARY STENT PLACEMENT N/A 02/23/2022   Procedure: BILIARY STENT PLACEMENT;  Surgeon: Carol Ada, MD;  [facility];  Service: Gastroenterology;  Laterality: N/A;   CHOLECYSTECTOMY     removed during whipple procedure   COLONOSCOPY WITH PROPOFOL N/A 01/19/2014   Procedure: COLONOSCOPY WITH PROPOFOL;  Surgeon: Juanita Craver, MD;  [facility];  Service: Endoscopy;  Laterality: N/A;   ENDOSCOPIC RETROGRADE CHOLANGIOPANCREATOGRAPHY (ERCP) WITH PROPOFOL N/A 02/23/2022   Procedure: ENDOSCOPIC RETROGRADE CHOLANGIOPANCREATOGRAPHY (ERCP) WITH PROPOFOL;  Surgeon: Carol Ada, MD;  [facility];  Service: Gastroenterology;  Laterality: N/A;   ERCP N/A 09/23/2020   Procedure: ENDOSCOPIC RETROGRADE CHOLANGIOPANCREATOGRAPHY (ERCP);  Surgeon: Carol Ada, MD;  Location: [facility];  Service: Endoscopy;  Laterality: N/A;   exploratory laparotomy N/A 01/03/2022   s/p graham patch for perforation at [place] N/A 10/02/2021   Procedure: open right inguinal hernia repair with mesh;  Surgeon: Dwan Bolt, MD;  Location: [facility];  Service: General;  Laterality: N/A;   LAPAROSCOPY N/A 10/31/2020   Procedure: Manfred Shirts LAPAROSCOPY;  Surgeon: Dwan Bolt, MD;  Location: [facility];  Service: General;  Laterality: N/A;  GEN AND TAP BLOCK   LAPAROTOMY N/A 01/03/2022   Procedure: EXPLORATORY LAPAROTOMY, GRAHAM PATCH REPAIR OF  PERFORATED ULCER;  Surgeon: Donnie Mesa, MD;  Location: [facility];  Service: General;  Laterality: N/A;   SPHINCTEROTOMY  09/23/2020   Procedure: Joan Mayans;  Surgeon: Carol Ada, MD;  [facility];  Service: Endoscopy;;   WHIPPLE PROCEDURE N/A 10/31/2020   Procedure: WHIPPLE PROCEDURE, INTRAOPERATIVE ULTRASOUND;  Surgeon: Dwan Bolt, MD;  Location: [facility];  Service: General;  Laterality: N/A;   WISDOM TOOTH EXTRACTION     67 years old   Family History Family History  Problem Relation Age of Onset   Diabetes Father    Heart Problems Father    Heart disease Father    Mental illness Father     Social History Social History   Tobacco Use   Smoking status: Every Day    Packs/day: 1.50    Years: 30.00    Total pack years: 45.00    Types: Cigarettes    Start date: 08/24/2017   Smokeless tobacco: Never  Vaping Use   Vaping Use: Former   Quit date: 11/20/2019  Substance Use Topics   Alcohol use: Not Currently    Comment: Quit in October 2021, used to drink heavy   Drug use: No   Allergies Quetiapine  Review of Systems Review of Systems  Gastrointestinal:  Positive for abdominal distention and abdominal pain.  Skin:  Positive for color change.    Physical Exam Vital Signs  I have reviewed the triage vital signs BP (!) 161/95   Pulse 74   Temp 98 F (36.7 C) (Oral)   Resp 16   Ht '5\' 10"'$  (1.778 m)   Wt 68 kg   SpO2 97%   BMI 21.52 kg/m   Physical Exam Constitutional:      General: He is not in acute distress.    Appearance: Normal appearance. He is ill-appearing.  HENT:     Head: Normocephalic and atraumatic.     Nose: No congestion or rhinorrhea.  Eyes:     General: Scleral icterus present.        Right eye: No discharge.  Left eye: No discharge.     Extraocular Movements: Extraocular movements intact.     Pupils: Pupils are equal, round, and reactive to light.  Cardiovascular:     Rate and Rhythm: Normal rate and regular rhythm.      Heart sounds: No murmur heard. Pulmonary:     Effort: No respiratory distress.     Breath sounds: No wheezing or rales.  Abdominal:     General: There is distension.     Tenderness: There is no abdominal tenderness.  Musculoskeletal:        General: Normal range of motion.     Cervical back: Normal range of motion.  Skin:    General: Skin is warm and dry.     Coloration: Skin is jaundiced.  Neurological:     General: No focal deficit present.     Mental Status: He is alert.     ED Results and Treatments Labs (all labs ordered are listed, but only abnormal results are displayed) Labs Reviewed  CBC - Abnormal; Notable for the following components:      Result Value   RBC 2.80 (*)    Hemoglobin 9.6 (*)    HCT 28.7 (*)    MCV 102.5 (*)    MCH 34.3 (*)    RDW 17.1 (*)    All other components within normal limits  COMPREHENSIVE METABOLIC PANEL - Abnormal; Notable for the following components:   Potassium 2.8 (*)    Glucose, Bld 113 (*)    Calcium 8.0 (*)    Total Protein 6.0 (*)    Albumin 2.2 (*)    AST 118 (*)    ALT 77 (*)    Alkaline Phosphatase 1,239 (*)    Total Bilirubin 12.1 (*)    All other components within normal limits  AMMONIA - Abnormal; Notable for the following components:   Ammonia 70 (*)    All other components within normal limits                                                                                                                          Radiology US Paracentesis  Result Date: 03/02/2022 INDICATION: Patient with history of metastatic colon cancer, ascites. Request is made for diagnostic and therapeutic paracentesis. EXAM: ULTRASOUND GUIDED DIAGNOSTIC AND THERAPEUTIC PARACENTESIS MEDICATIONS: 10 mL 1% lidocaine COMPLICATIONS: None immediate. PROCEDURE: Informed written consent was obtained from the patient after a discussion of the risks, benefits and alternatives to treatment. A timeout was performed prior to the initiation of the  procedure. Initial ultrasound scanning demonstrates a moderate amount of ascites within the right lower abdominal quadrant. The right lower abdomen was prepped and draped in the usual sterile fashion. 1% lidocaine was used for local anesthesia. Following this, a 6 Fr Safe-T-Centesis catheter was introduced. An ultrasound image was saved for documentation purposes. The paracentesis was performed. The catheter was removed and a dressing was applied. The patient tolerated the procedure well without immediate post  procedural complication. FINDINGS: A total of approximately 2.3 liters of bright yellow fluid was removed. Samples were sent to the laboratory as requested by the clinical team. IMPRESSION: Successful ultrasound-guided paracentesis yielding 2.3 liters of peritoneal fluid. Read by: Brynda Greathouse PA-C Electronically Signed   By: Sandi Mariscal M.D.   On: 03/02/2022 16:29    Pertinent labs & imaging results that were available during my care of the patient were reviewed by me and considered in my medical decision making (see MDM for details).  Medications Ordered in ED Medications  lidocaine (XYLOCAINE) 1 % (with pres) injection (10 mLs  Given 03/02/22 1504)                                                                                                                                     Procedures Procedures  (including critical care time)  Medical Decision Making / ED Course   This patient presents to the ED for concern of abdominal pain jaundice and swelling, this involves an extensive number of treatment options, and is a complaint that carries with it a high risk of complications and morbidity.  The differential diagnosis includes malignant ascites, SBP, progression of underlying disease, worsening tumor burden  MDM: Patient seen emerged part for evaluation of worsening jaundice, abdominal distention and concern for development of malignant ascites.  Physical exam reveals an ill-appearing  jaundiced patient with abdominal distention and mild tenderness to palpation.  Laboratory evaluation with hemoglobin 9.6 with an MCV of 102.5, potassium 2.8, albumin 2.2, AST 118, ALT 77, alk phos 1239 with a total bili of 12.1.  I performed a bedside ultrasound and did not see a sizable amount of fluid that would be amenable to a therapeutic paracentesis.  I initially spoke with Dr. Benson Norway of gastroenterology who recently performed an ERCP on this patient but states that the majority of the patient's care is being driven by hospice at this time and he is no longer involved in this patient's care.  I spoke with the interventional radiologist on-call who states that we are unsure of the rate of patient's fluid accumulation at this time and unless he is rapidly filling with fluid on a frequent basis the patient likely would not benefit from a abdominal drain and he has instead recommending therapeutic paracentesis today and discharged with outpatient interventional radiology follow-up if he is to need a drain.  I spoke with the hospice facility who is in agreement with this plan and the patient is also in agreement with this plan.  A ultrasound-guided paracentesis was ordered at time of signout, patient pending this procedure.  Patient is to be discharged back to his hospice facility after paracentesis.  Patient then signed out to oncoming provider.  Please see provider signout for continuation of workup.   Additional history obtained:  -External records from outside source obtained and reviewed including: Chart review including previous notes, labs, imaging, consultation notes  Lab Tests: -I ordered, reviewed, and interpreted labs.   The pertinent results include:   Labs Reviewed  CBC - Abnormal; Notable for the following components:      Result Value   RBC 2.80 (*)    Hemoglobin 9.6 (*)    HCT 28.7 (*)    MCV 102.5 (*)    MCH 34.3 (*)    RDW 17.1 (*)    All other components within normal limits   COMPREHENSIVE METABOLIC PANEL - Abnormal; Notable for the following components:   Potassium 2.8 (*)    Glucose, Bld 113 (*)    Calcium 8.0 (*)    Total Protein 6.0 (*)    Albumin 2.2 (*)    AST 118 (*)    ALT 77 (*)    Alkaline Phosphatase 1,239 (*)    Total Bilirubin 12.1 (*)    All other components within normal limits  AMMONIA - Abnormal; Notable for the following components:   Ammonia 70 (*)    All other components within normal limits       Medicines ordered and prescription drug management: Meds ordered this encounter  Medications   lidocaine (XYLOCAINE) 1 % (with pres) injection    Gershon Cull M: cabinet override    -I have reviewed the patients home medicines and have made adjustments as needed  Critical interventions none  Consultations Obtained: I requested consultation with the gastroenterologist Dr. Benson Norway and interventional radiologist on-call Dr. Pascal Lux,  and discussed lab and imaging findings as well as pertinent plan - they recommend: Therapeutic paracentesis today and outpatient interventional radiology follow-up if he is to continue to reaccumulate fluid at a rapid rate   Cardiac Monitoring: The patient was maintained on a cardiac monitor.  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   Diabetes mellitus without complication ([place])    type 2   Dysrhythmia    hx. Bundle branch block- saw Dr. Kathlee Nations   GERD (gastroesophageal reflux disease)    Hypertension    Irregular heart rate    RBBB    Sleep apnea    cpap used with nose clip- Dr. Baird Lyons follows   Substance abuse [facility])    "tends to self medicate(Rx. meds or Alcohol) trying to get relief from "PSTD"      Dispostion: I considered admission for this patient, but the patient's goals of care do not align with hospital admission at this time and he will be discharged back to his hospice facility after paracentesis     Final Clinical Impression(s) / ED Diagnoses Final diagnoses:  Malignant ascites  Hospice care patient     '@PCDICTATION'$ @    Teressa Lower, MD 03/02/22 1956

## 2022-03-05 ENCOUNTER — Other Ambulatory Visit (HOSPITAL_COMMUNITY): Payer: Self-pay | Admitting: Hospice and Palliative Medicine

## 2022-03-05 DIAGNOSIS — R180 Malignant ascites: Secondary | ICD-10-CM

## 2022-03-06 ENCOUNTER — Telehealth (HOSPITAL_COMMUNITY): Payer: Self-pay

## 2022-03-06 ENCOUNTER — Ambulatory Visit (HOSPITAL_COMMUNITY)
Admission: RE | Admit: 2022-03-06 | Discharge: 2022-03-06 | Disposition: A | Discharge status: Home / Self Care | Payer: Federal, State, Local not specified - PPO | Source: Ambulatory Visit | Attending: Hospice and Palliative Medicine | Admitting: Hospice and Palliative Medicine

## 2022-03-06 DIAGNOSIS — R188 Other ascites: Secondary | ICD-10-CM | POA: Insufficient documentation

## 2022-03-06 DIAGNOSIS — R180 Malignant ascites: Secondary | ICD-10-CM

## 2022-03-06 HISTORY — PX: IR PARACENTESIS: IMG2679

## 2022-03-06 MED ORDER — LIDOCAINE HCL 1 % IJ SOLN
INTRAMUSCULAR | Status: AC
  Administered 2022-03-06: 8 mL
  Filled 2022-03-06: qty 20

## 2022-03-06 NOTE — Procedures (Signed)
PROCEDURE SUMMARY:  Successful US guided paracentesis from left abdomen.  Yielded 1 L  of orange fluid.  No immediate complications.  Pt tolerated well.   Specimen not sent for labs.  EBL < 2 mL  Theresa Duty, NP 03/06/2022 10:53 AM

## 2022-03-06 NOTE — Telephone Encounter (Signed)
-----  Message from Aletta Edouard, MD sent at 03/06/2022  9:14 AM EST ----- Regarding: RE: Pleurx catheter OK for peritoneal PleurX in IR with sedation.  GY   ----- Message ----- From: Chad Cordial Sent: 03/05/2022  12:22 PM EST To: Ir Procedure Requests Subject: Pleurx catheter                                Procedure: Pleurx catheter placement  Ordering: Dr. Micheline Rough ([place] [phone])  Imaging: in Epic  Dx: malignant ascites  Please review.   Thanks,  Lia Foyer

## 2022-03-08 ENCOUNTER — Other Ambulatory Visit: Payer: Self-pay | Admitting: Radiology

## 2022-03-08 DIAGNOSIS — C221 Intrahepatic bile duct carcinoma: Secondary | ICD-10-CM

## 2022-03-08 MED ORDER — DEXTROSE 5 % IV SOLN: 2.0000 g | INTRAVENOUS | Status: AC

## 2022-03-09 ENCOUNTER — Encounter: Payer: Self-pay | Admitting: Hematology

## 2022-03-12 ENCOUNTER — Ambulatory Visit (HOSPITAL_COMMUNITY): Admission: RE | Admit: 2022-03-12 | Payer: PRIVATE HEALTH INSURANCE | Source: Ambulatory Visit

## 2022-03-12 ENCOUNTER — Encounter (HOSPITAL_COMMUNITY): Payer: Self-pay

## 2022-03-15 ENCOUNTER — Ambulatory Visit (HOSPITAL_COMMUNITY): Payer: PRIVATE HEALTH INSURANCE

## 2022-03-22 DEATH — deceased

## 2022-04-23 ENCOUNTER — Ambulatory Visit: Payer: Federal, State, Local not specified - PPO | Admitting: Internal Medicine

## 2022-09-11 IMAGING — US US ABDOMEN COMPLETE
1 series · 13 of 25 positions shown · non-contrast
Comparison: CT scan of May 08, 2021. Ultrasound September 22, 2020.

CLINICAL DATA: Cholangiocarcinoma.

EXAM:
ABDOMEN ULTRASOUND COMPLETE

[Series 1: us abdomen complete · 13 of 76 slices shown]
[im 1/76]
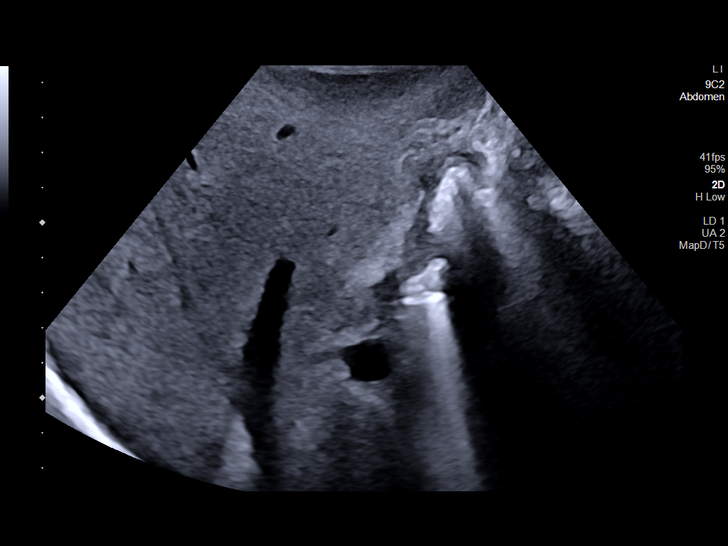
[im 7/76]
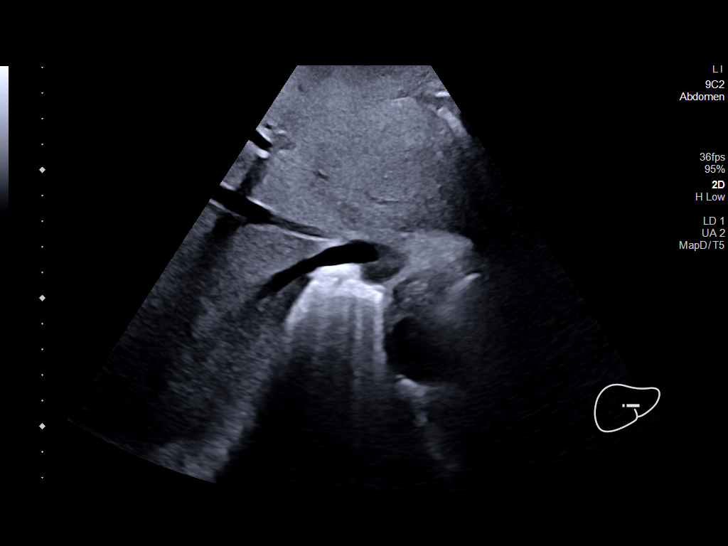
[im 13/76]
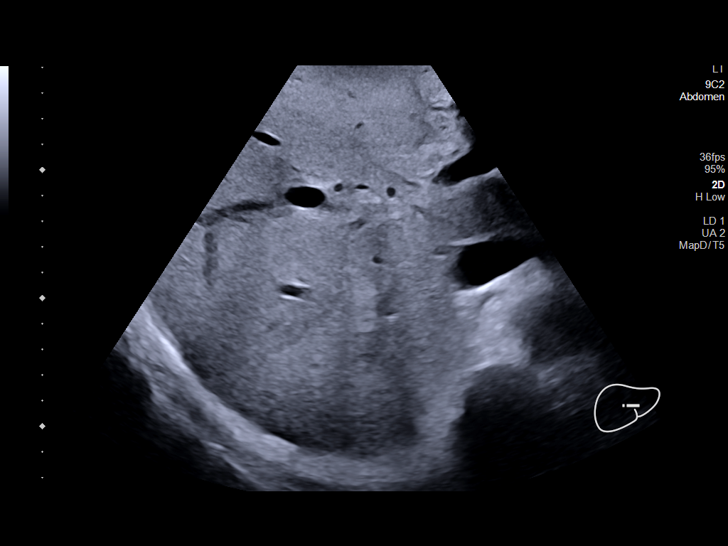
[im 19/76]
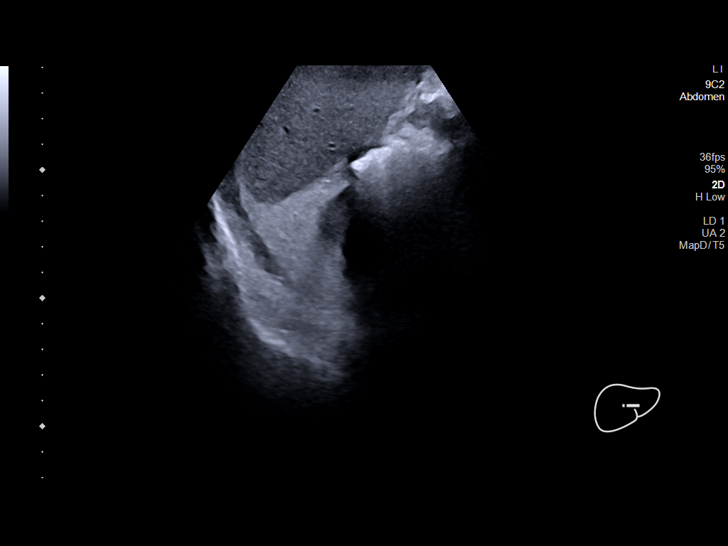
[im 26/76]
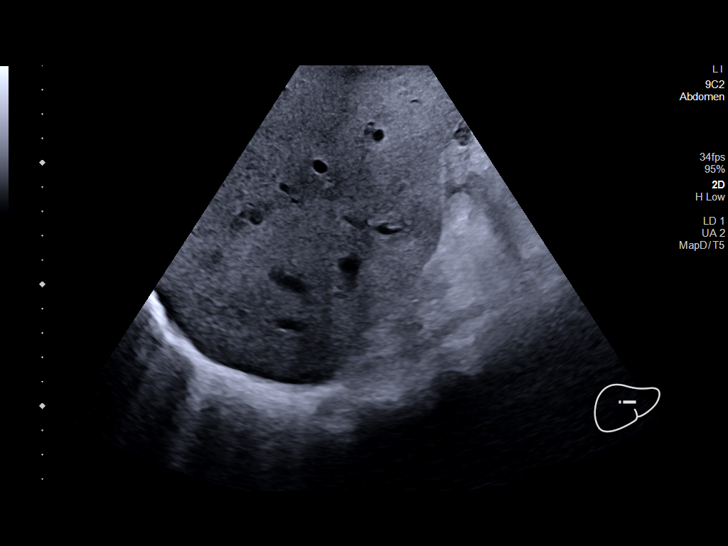
[im 32/76]
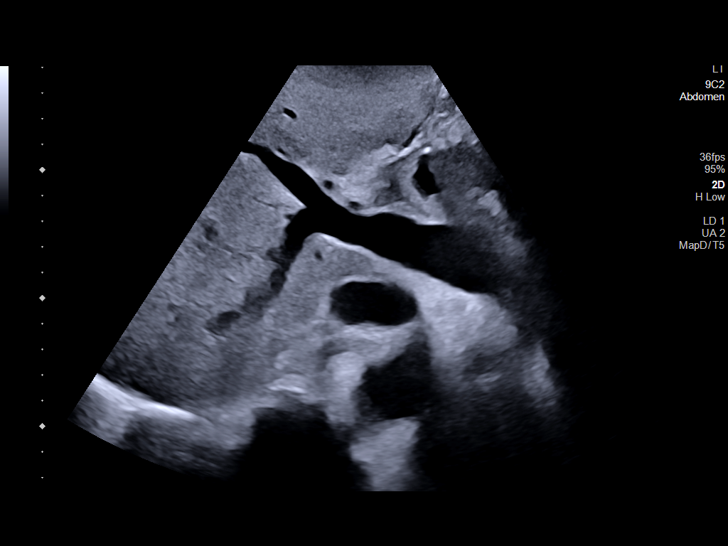
[im 38/76]
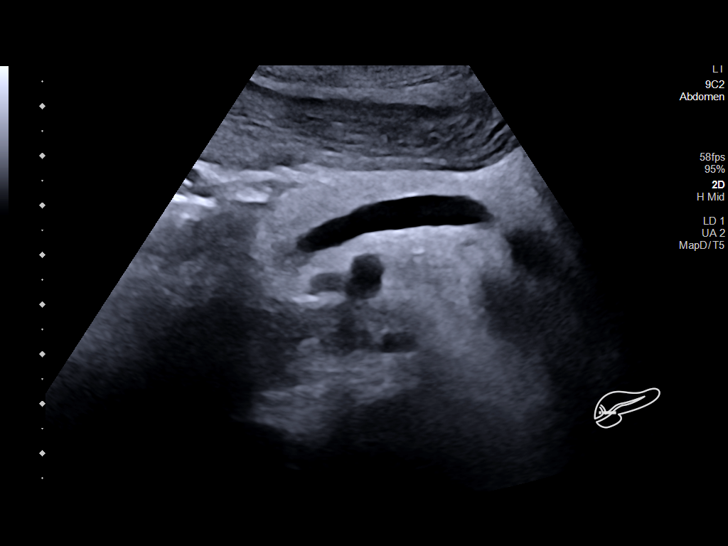
[im 44/76]
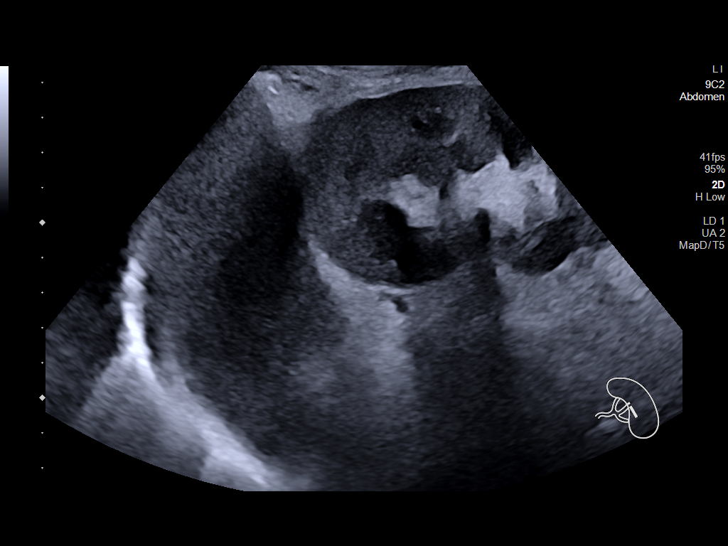
[im 51/76]
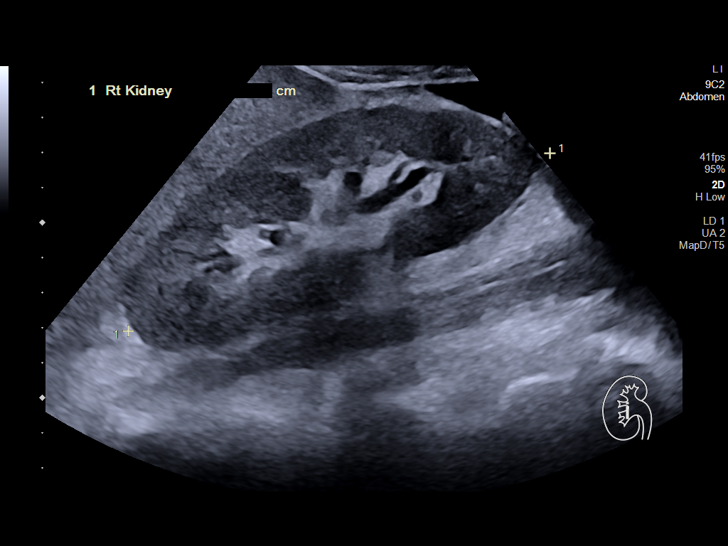
[im 57/76]
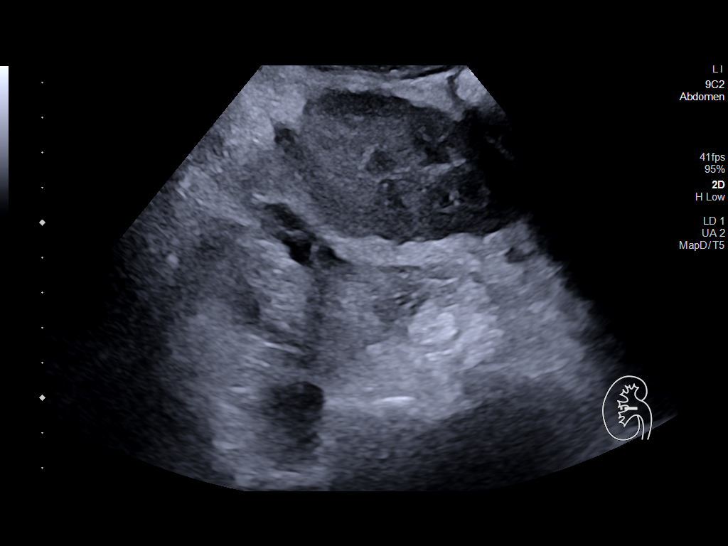
[im 63/76]
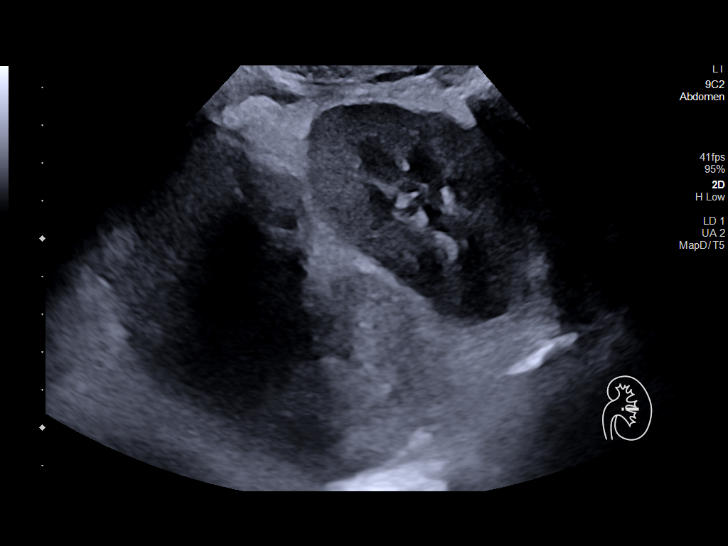
[im 69/76]
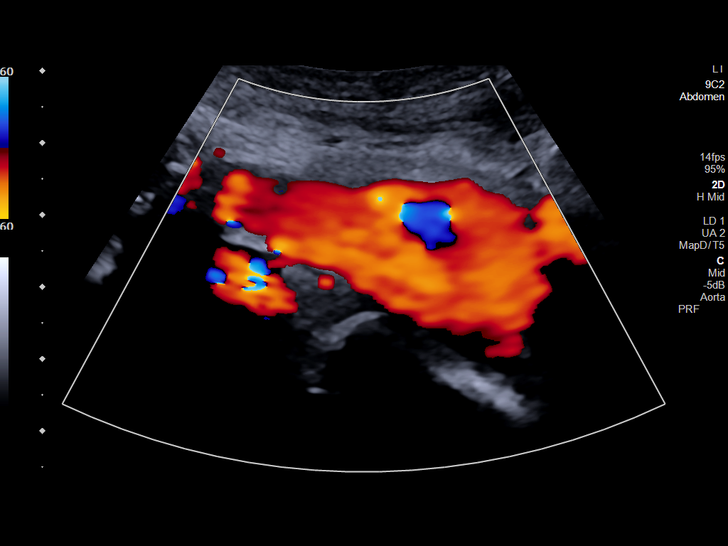
[im 76/76]
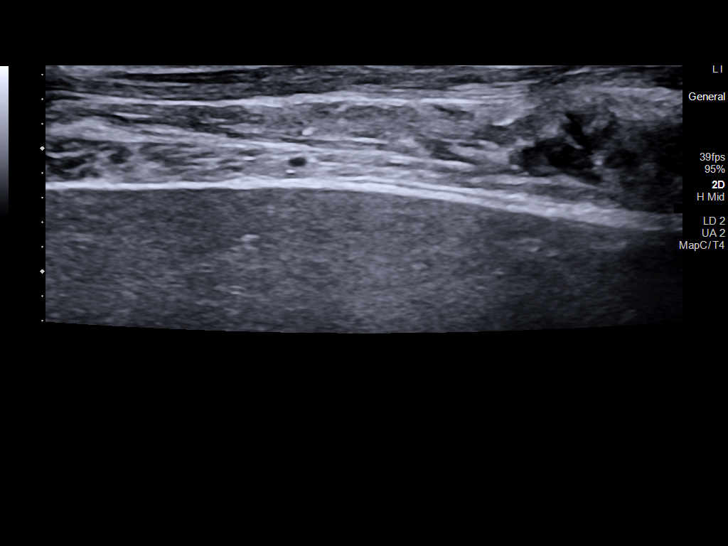

[13 of 25 positions shown; findings below may reference images not displayed]

FINDINGS: Gallbladder: Status post cholecystectomy.

Common bile duct: Diameter: 3 mm which is within normal limits.

Liver: No focal lesion identified. Increased echogenicity of hepatic
parenchyma is noted suggesting hepatic steatosis. Portal vein is
patent on color Doppler imaging with normal direction of blood flow
towards the liver.

IVC: No abnormality visualized.

Pancreas: Pancreatic duct is dilated at 6 mm. No other definite
pancreatic abnormality seen.

Spleen: Size and appearance within normal limits.

Right Kidney: Length: 13.1 cm. Echogenicity within normal limits. No
mass or hydronephrosis visualized.

Left Kidney: Length: 12.2 cm. Echogenicity within normal limits. No
mass or hydronephrosis visualized.

Abdominal aorta: No aneurysm visualized.

Other findings: None.
IMPRESSION: Status post cholecystectomy.

Pancreatic duct is severely dilated at 6 mm. CT scan is recommended
for further evaluation.

Increased echogenicity of hepatic parenchyma is noted suggesting
hepatic steatosis. No definite focal sonographic hepatic abnormality
is noted, although CT would be more sensitive.

## 2022-09-12 IMAGING — CT CT ABD-PELV W/ CM
2 of 5 series · 15 of 46 positions shown, 17 images · IV contrast (OMNIPAQUE)
Comparison: Ultrasound of 07/18/2021.  CT of 05/08/2021

CLINICAL DATA: Abdominal pain. Bloating. History of extrahepatic
cholangiocarcinoma, status post Whipple procedure in 7877. *
Tracking Code: BO *

EXAM:
CT ABDOMEN AND PELVIS WITH CONTRAST
TECHNIQUE: Multidetector CT imaging of the abdomen and pelvis was performed
using the standard protocol following bolus administration of
intravenous contrast.

[Series 2: axial st · axial · 0.86mm/px · z∈[-75,+305]mm · 12 of 88 slices shown, 14 images]
[im 6/88  soft-tissue]
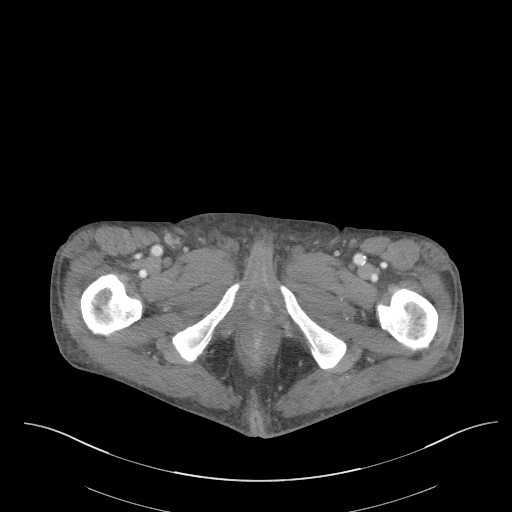
[im 6/88  bone]
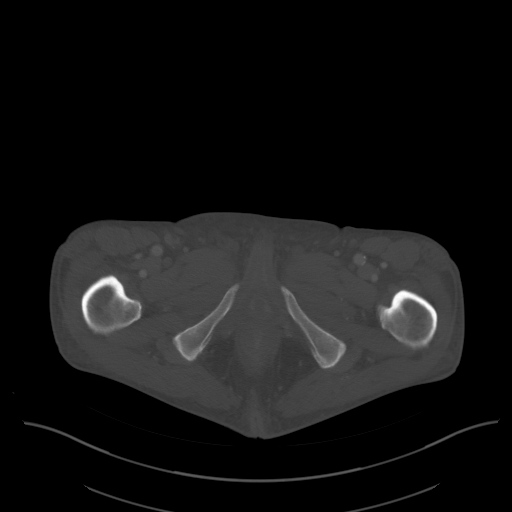
[im 12/88  soft-tissue]
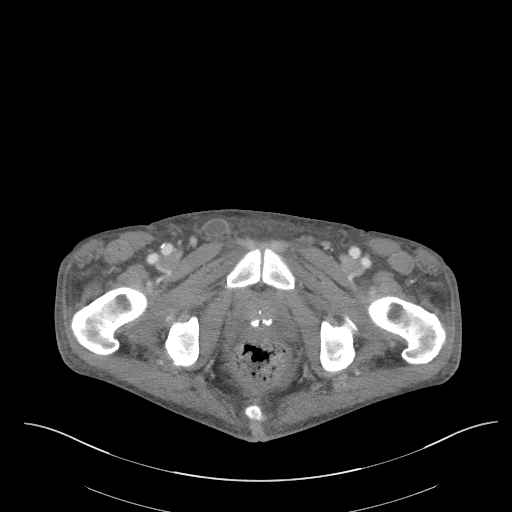
[im 18/88  soft-tissue]
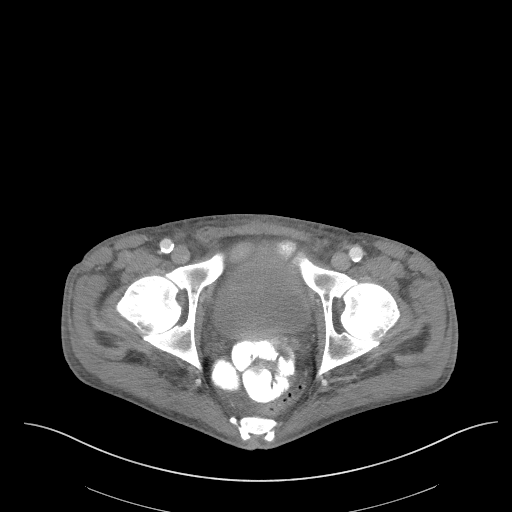
[im 30/88  soft-tissue]
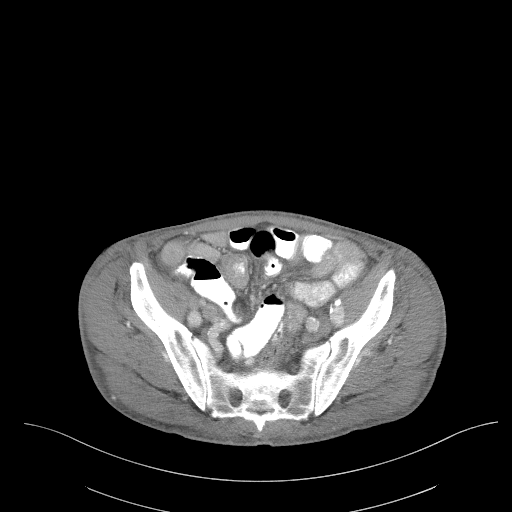
[im 35/88  soft-tissue]
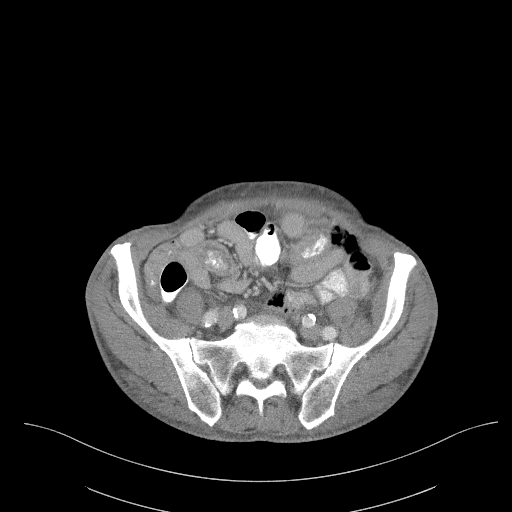
[im 41/88  soft-tissue]
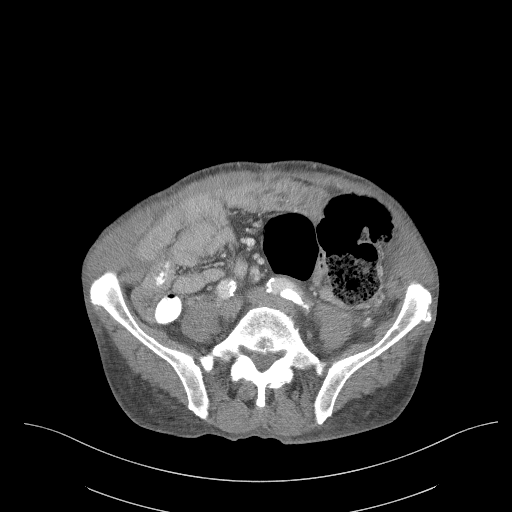
[im 47/88  soft-tissue]
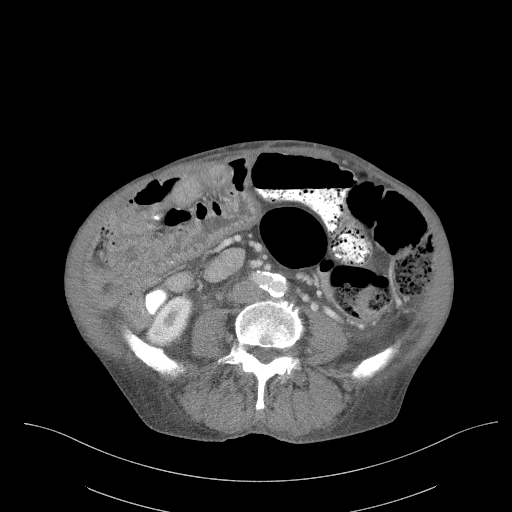
[im 53/88  soft-tissue]
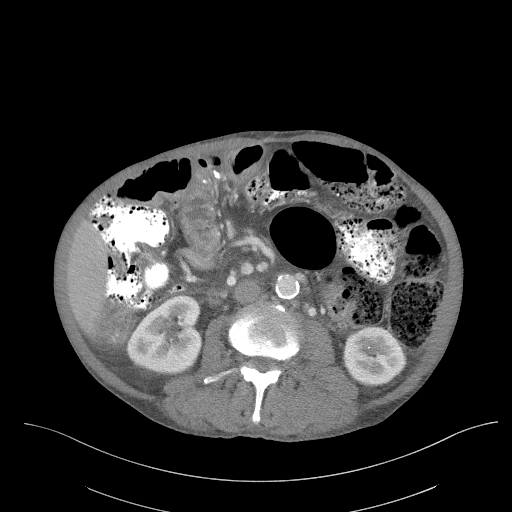
[im 59/88  soft-tissue]
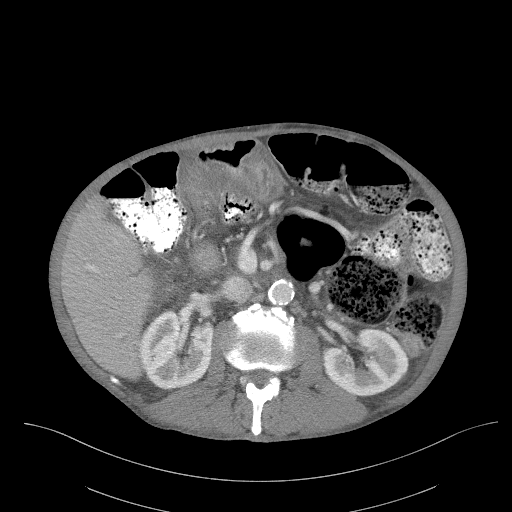
[im 59/88  bone]
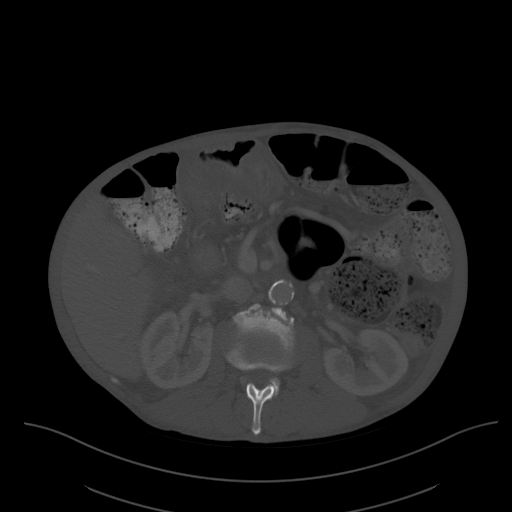
[im 70/88  soft-tissue]
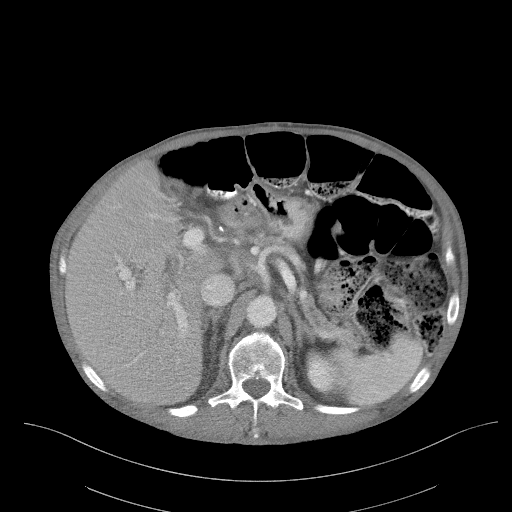
[im 76/88  soft-tissue]
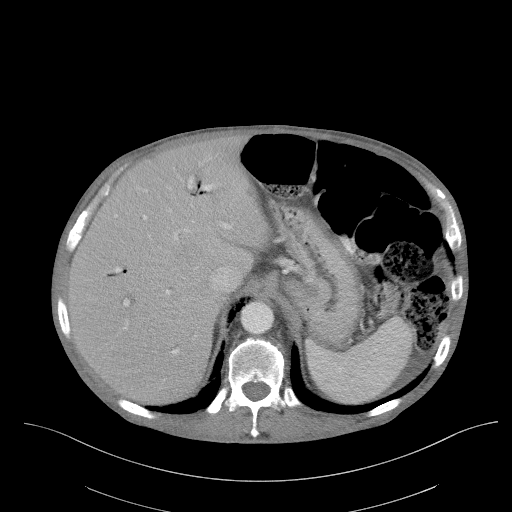
[im 82/88  soft-tissue]
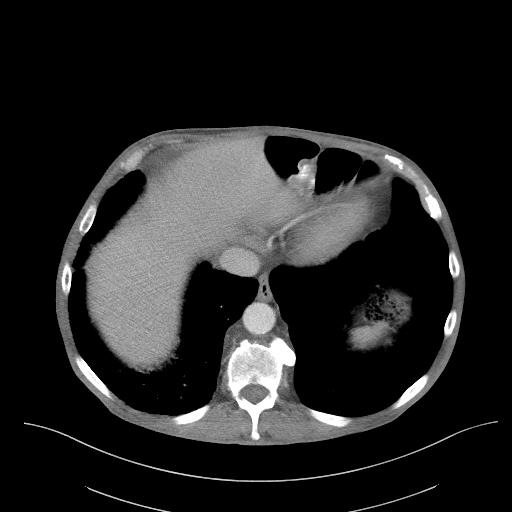

[Series 5: coronal st · coronal · 0.72mm/px · 3 of 90 slices shown]
[im 30/90  soft-tissue]
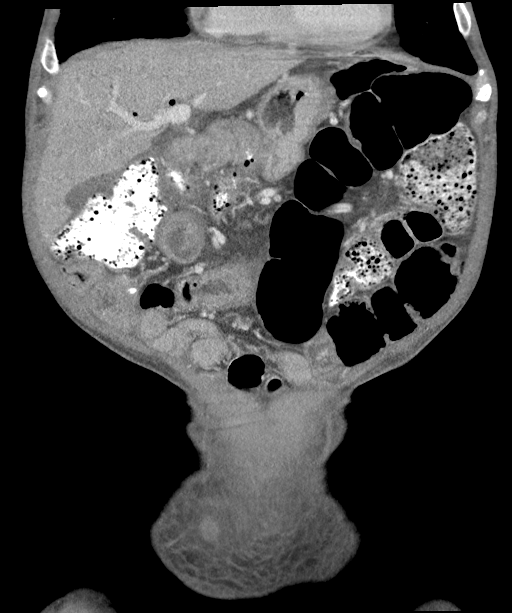
[im 40/90  soft-tissue]
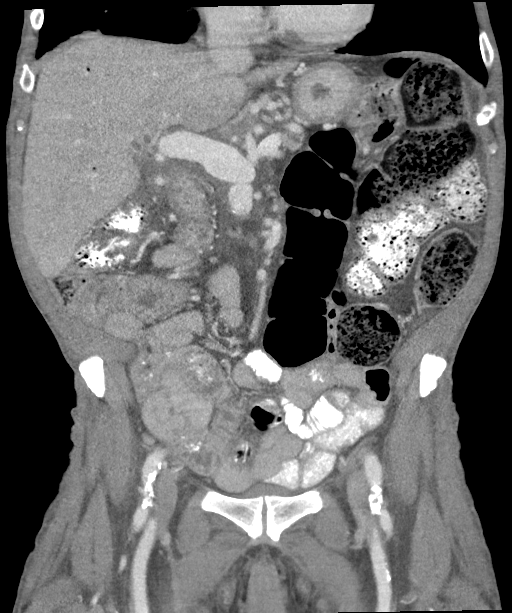
[im 50/90  soft-tissue]
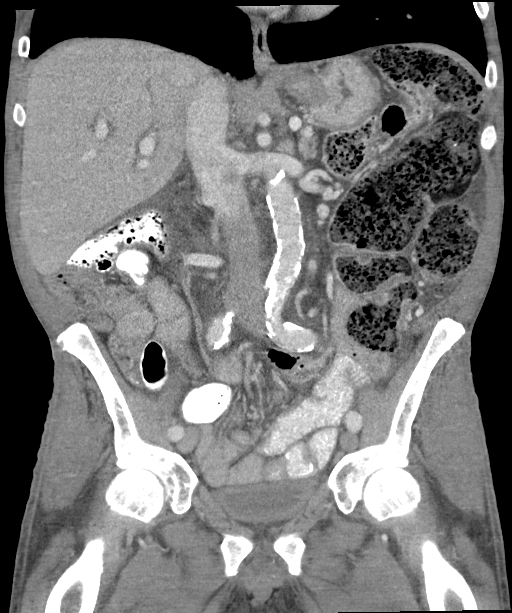

[15 of 46 positions shown; findings below may reference images not displayed]

RADIATION DOSE REDUCTION: This exam was performed according to the
departmental dose-optimization program which includes automated
exposure control, adjustment of the mA and/or kV according to
patient size and/or use of iterative reconstruction technique.

CONTRAST:  100mL OMNIPAQUE IOHEXOL 300 MG/ML  SOLN
FINDINGS: Lower chest: Emphysema. Interstitial thickening at the right lung
base is most consistent with asymmetric interstitial lung disease.
Normal heart size without pericardial or pleural effusion.

Hepatobiliary: No focal liver lesion. Pneumobilia. Cholecystectomy.
Choledochojejunostomy.

Pancreas: Pancreatic atrophy is moderate. The pancreatic duct is
mildly dilated throughout its course, including at 5 mm on [DATE]
within the neck. New since the prior exam. Followed to the level of
the pancreatic to jejunal anastomosis. No peripancreatic edema.

Spleen: Normal in size, without focal abnormality.

Adrenals/Urinary Tract: Normal adrenal glands. Bilateral too small
to characterize renal lesions. Bilateral punctate renal collecting
system calculi. No hydronephrosis. Normal urinary bladder.

Stomach/Bowel: The proximal stomach is underdistended. Apparent mild
proximal gastric wall thickening is likely secondary on [DATE].
Gastric antral wall thickening just proximal to the gastrojejunal
anastomosis including on [DATE], mild-to-moderate.

Colonic stool burden suggests constipation. Normal terminal ileum.
Otherwise normal small bowel.

Vascular/Lymphatic: Aortic atherosclerosis. Abdominal
retroperitoneal edema is chronic and could be treatment related.
Somewhat more focal soft tissue density about the celiac and common
hepatic artery origin including on [DATE] is relatively similar to on
the prior exam. No pelvic sidewall adenopathy.

Reproductive: Normal prostate.

Other: Tiny right inguinal hernia contains trace fluid, chronic.
Trace perisplenic ascites, similar.

Musculoskeletal: Bilateral femoral head avascular necrosis, present
back to at least 10/10/2020.
IMPRESSION: 1. Status post Whipple procedure. Chronic pancreatic atrophy with
development of mild pancreatic duct dilatation, followed to the
level of the pancreatic to jejunal anastomosis. No dominant
obstructive mass identified.
2. Persistent soft tissue density along the origin of the celiac.
Although this could be treatment related, local residual or
recurrent disease cannot be excluded. Given constellation of
findings, consider further evaluation with PET to exclude otherwise
occult local recurrence.
3.  Possible constipation.
4. Findings suspicious for gastritis at the antrum, just proximal to
the gastrojejunal anastomosis.
5. Similar trace perihepatic ascites.
6. Bilateral nephrolithiasis
7. Bilateral femoral head avascular necrosis without collapse.

## 2022-10-01 IMAGING — PT NM PET TUM IMG RESTAG (PS) SKULL BASE T - THIGH
7 series · 25 of 25 positions shown · non-contrast
Comparison: CT scan 07/19/2021.  PET-CT 10/10/2020

CLINICAL DATA: Subsequent treatment strategy for extrahepatic
cholangiocarcinoma. Status post Whipple procedure.

EXAM:
NUCLEAR MEDICINE PET SKULL BASE TO THIGH
TECHNIQUE: 7.9 mCi F-18 FDG was injected intravenously. Full-ring PET imaging
was performed from the skull base to thigh after the radiotracer. CT
data was obtained and used for attenuation correction and anatomic
localization.
Fasting blood glucose: 118 mg/dl

[Series 3: pet sk_thigh ac · axial · 5.0mm · 4.07mm/px · z∈[-1044,-76]mm · 6 of 243 slices shown]
[im 1/243]
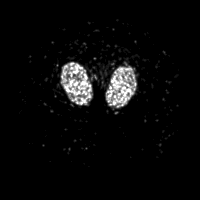
[im 49/243]
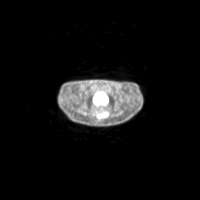
[im 97/243]
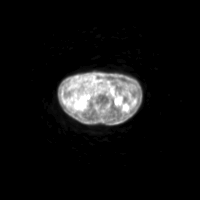
[im 146/243]
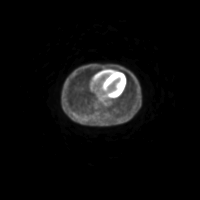
[im 194/243]
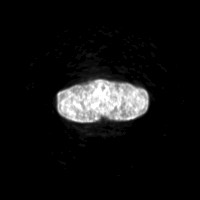
[im 243/243]
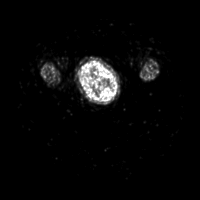

[Series 4: ct sk_thigh 5.0 br38 · axial · 5.0mm · 0.98mm/px · z∈[-1044,-76]mm · 5 of 243 slices shown]
[im 1/243]
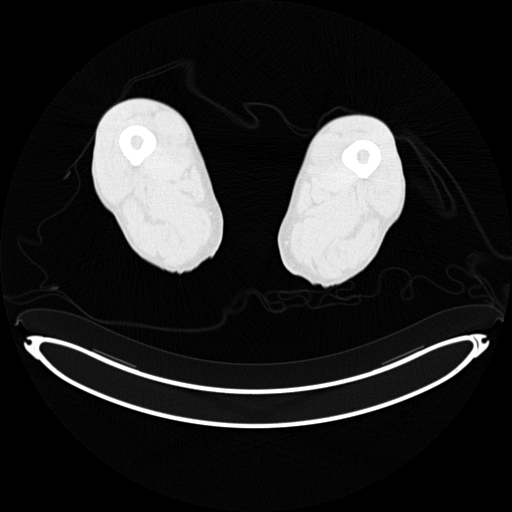
[im 61/243]
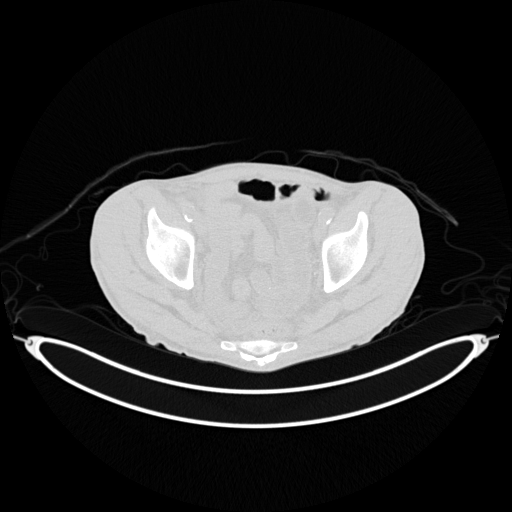
[im 122/243]
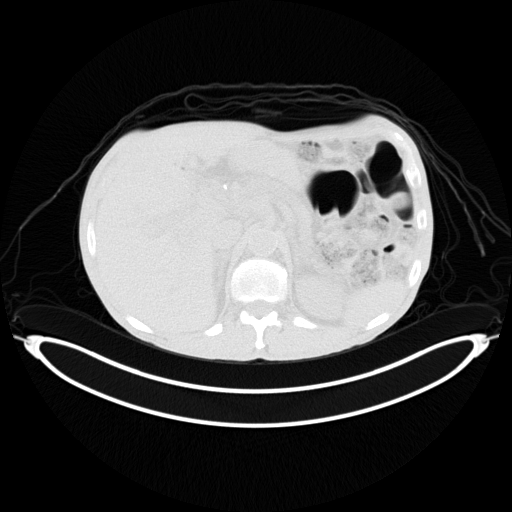
[im 182/243]
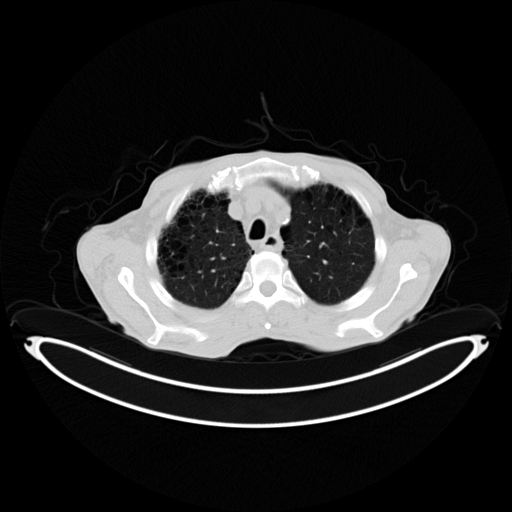
[im 243/243  brain]
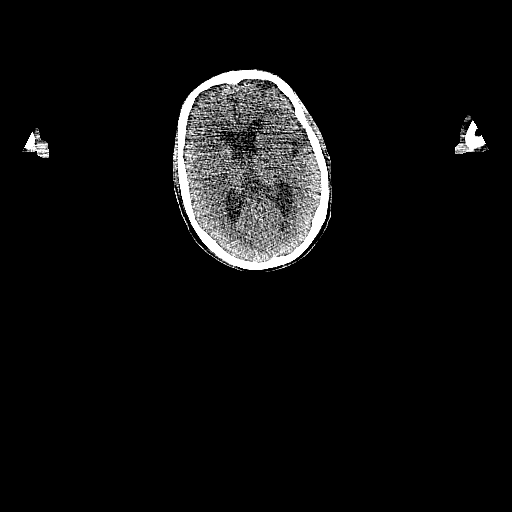

[Series 5: pet sk_thigh nac · axial · 5.0mm · 4.07mm/px · z∈[-1044,-76]mm · 5 of 243 slices shown]
[im 1/243]
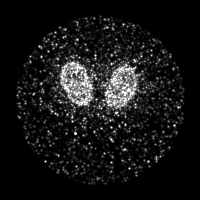
[im 61/243]
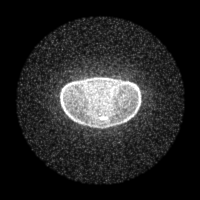
[im 122/243]
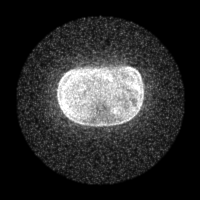
[im 182/243]
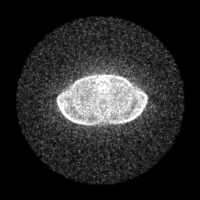
[im 243/243]
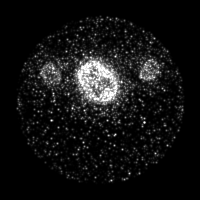

[Series 7: ct 5.0 bl57 lung_bone · axial · 5.0mm · 0.72mm/px · z∈[-564,-244]mm · 2 of 81 slices shown]
[im 1/81]
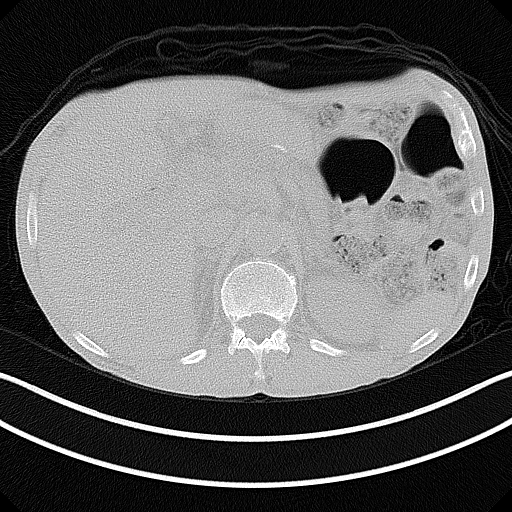
[im 81/81]
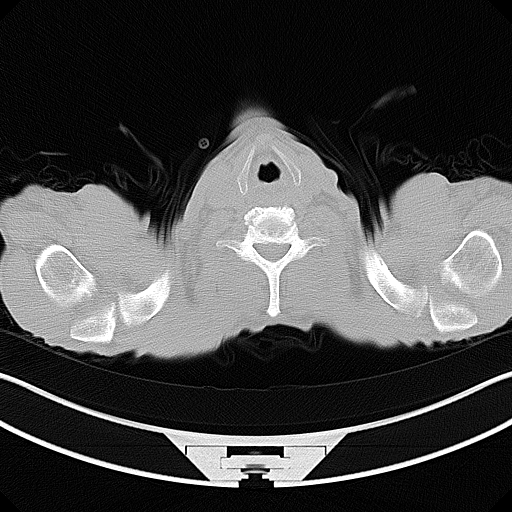

[Series 604: fused tra · 5 of 237 slices shown]
[im 1/237]
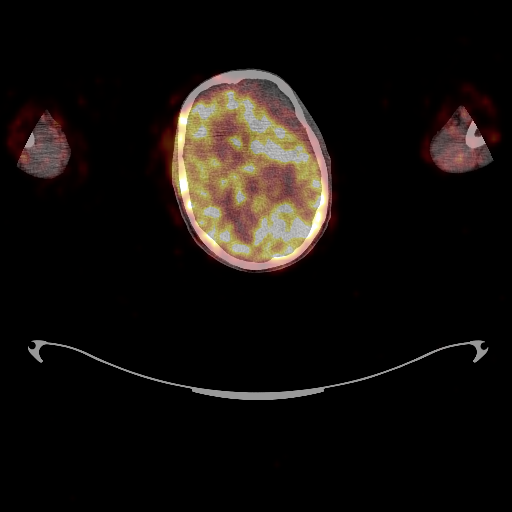
[im 60/237]
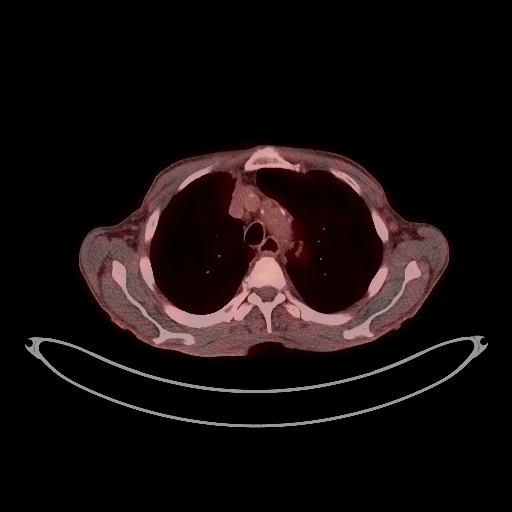
[im 119/237]
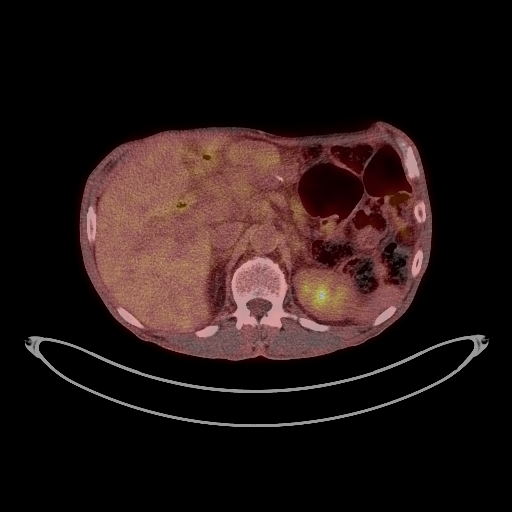
[im 178/237]
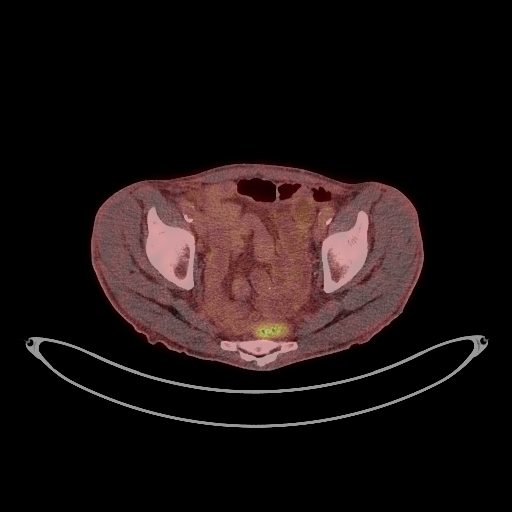
[im 237/237]
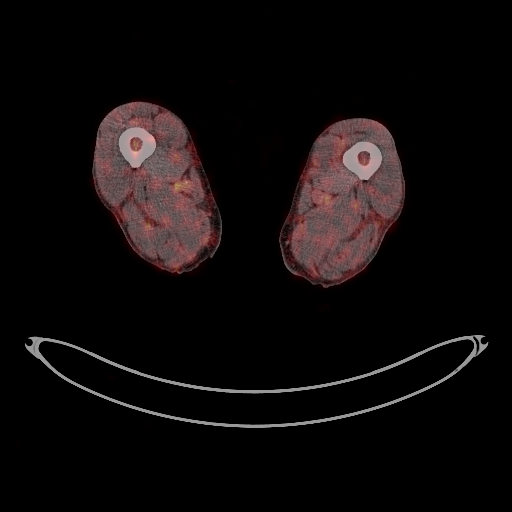

[Series 605: fused cor · 1 of 30 slices shown]
[im 1/30]
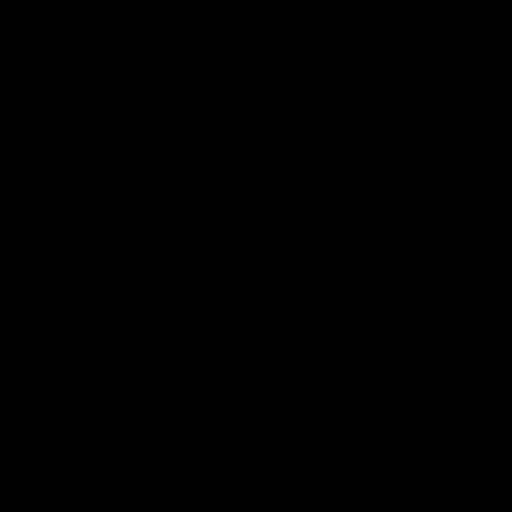

[Series 606: mip pet · coronal · 2.01mm/px · 1 of 32 slices shown]
[im 1/32]
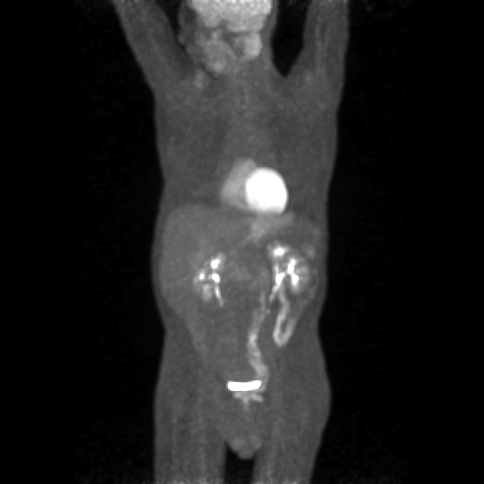

[25 of 25 positions shown; findings below may reference images not displayed]

FINDINGS: Mediastinal blood pool activity: SUV max

Liver activity: SUV max N/A

NECK: No hypermetabolic lymph nodes in the neck.

Incidental CT findings: none

CHEST: No hypermetabolic mediastinal or hilar nodes. No suspicious
pulmonary nodules on the CT scan.

Incidental CT findings: Mild atherosclerotic calcification is noted
in the wall of the thoracic aorta. Coronary artery calcification is
evident. Enlargement of the pulmonary outflow tract and main
pulmonary arteries suggests pulmonary arterial hypertension.
Centrilobular and paraseptal emphysema evident. Bilateral
gynecomastia evident.

ABDOMEN/PELVIS: No abnormal hypermetabolic activity within the
liver, pancreas, adrenal glands, or spleen. No hypermetabolic lymph
nodes in the abdomen or pelvis. Low level uptake is identified in
the wall the stomach, more so distally. Diffuse uptake in the left
colon likely physiologic.

No evidence for hypermetabolism associated with the soft tissue
density described along the origin of the celiac axis on recent
diagnostic CT imaging. No evidence for hypermetabolism in the
surgical bed in this patient status post Whipple procedure.

Incidental CT findings: There is moderate atherosclerotic
calcification of the abdominal aorta without aneurysm.
Nonobstructing stones are seen in both kidneys.

SKELETON: No focal hypermetabolic activity to suggest skeletal
metastasis.

Incidental CT findings: none
IMPRESSION: 1. No findings today to suggest hypermetabolic recurrent or
metastatic disease. Specifically, there is no hypermetabolism
associated with the soft tissue density described along the origin
of the celiac axis on recent diagnostic CT imaging.
2. Low level FDG accumulation in the gastric wall may be related to
the wall thickening/inflammation as suggested on recent CT.
3.  Aortic Atherosclerois (ELAN1-170.0)
4.  Emphysema. ([id number].A)

## 2022-10-03 ENCOUNTER — Other Ambulatory Visit (HOSPITAL_COMMUNITY): Payer: Self-pay
